# Patient Record
Sex: Female | Born: 1955
Health system: Southern US, Community
[De-identification: ages and names within clinical notes are randomized; demographics above are authoritative.]

## PROBLEM LIST (undated history)

## (undated) DIAGNOSIS — G44229 Chronic tension-type headache, not intractable: Secondary | ICD-10-CM

## (undated) DIAGNOSIS — R002 Palpitations: Secondary | ICD-10-CM

## (undated) DIAGNOSIS — N200 Calculus of kidney: Secondary | ICD-10-CM

## (undated) DIAGNOSIS — I10 Essential (primary) hypertension: Secondary | ICD-10-CM

## (undated) DIAGNOSIS — E079 Disorder of thyroid, unspecified: Secondary | ICD-10-CM

## (undated) DIAGNOSIS — Z8619 Personal history of other infectious and parasitic diseases: Secondary | ICD-10-CM

## (undated) DIAGNOSIS — I8390 Asymptomatic varicose veins of unspecified lower extremity: Secondary | ICD-10-CM

## (undated) DIAGNOSIS — R55 Syncope and collapse: Secondary | ICD-10-CM

## (undated) DIAGNOSIS — T7840XA Allergy, unspecified, initial encounter: Secondary | ICD-10-CM

## (undated) DIAGNOSIS — H269 Unspecified cataract: Secondary | ICD-10-CM

## (undated) HISTORY — DX: Unspecified cataract: H26.9

## (undated) HISTORY — PX: KNEE SURGERY: SHX244

## (undated) HISTORY — PX: ABDOMINAL HYSTERECTOMY: SHX81

## (undated) HISTORY — DX: Asymptomatic varicose veins of unspecified lower extremity: I83.90

## (undated) HISTORY — DX: Palpitations: R00.2

## (undated) HISTORY — DX: Personal history of other infectious and parasitic diseases: Z86.19

## (undated) HISTORY — PX: CATARACT EXTRACTION: SUR2

## (undated) HISTORY — PX: ROTATOR CUFF REPAIR: SHX139

## (undated) HISTORY — PX: TONSILLECTOMY: SUR1361

## (undated) HISTORY — DX: Chronic tension-type headache, not intractable: G44.229

## (undated) HISTORY — DX: Syncope and collapse: R55

## (undated) HISTORY — PX: CHOLECYSTECTOMY: SHX55

## (undated) HISTORY — DX: Allergy, unspecified, initial encounter: T78.40XA

## (undated) HISTORY — DX: Calculus of kidney: N20.0

---

## 1997-02-17 DIAGNOSIS — D126 Benign neoplasm of colon, unspecified: Secondary | ICD-10-CM

## 1997-02-17 HISTORY — DX: Benign neoplasm of colon, unspecified: D12.6

## 1998-05-17 ENCOUNTER — Encounter: Payer: Self-pay | Admitting: Emergency Medicine

## 1998-05-17 ENCOUNTER — Emergency Department (HOSPITAL_COMMUNITY): Admission: EM | Admit: 1998-05-17 | Discharge: 1998-05-17 | Payer: Self-pay | Admitting: Emergency Medicine

## 1998-07-17 ENCOUNTER — Inpatient Hospital Stay (HOSPITAL_COMMUNITY): Admission: RE | Admit: 1998-07-17 | Discharge: 1998-07-19 | Payer: Self-pay | Admitting: Obstetrics and Gynecology

## 1998-07-17 ENCOUNTER — Encounter (INDEPENDENT_AMBULATORY_CARE_PROVIDER_SITE_OTHER): Payer: Self-pay | Admitting: *Deleted

## 1998-07-22 ENCOUNTER — Encounter: Payer: Self-pay | Admitting: Obstetrics and Gynecology

## 1998-07-22 ENCOUNTER — Inpatient Hospital Stay (HOSPITAL_COMMUNITY): Admission: AD | Admit: 1998-07-22 | Discharge: 1998-07-31 | Payer: Self-pay | Admitting: Obstetrics and Gynecology

## 1998-07-24 ENCOUNTER — Encounter: Payer: Self-pay | Admitting: Obstetrics and Gynecology

## 1998-07-25 ENCOUNTER — Encounter: Payer: Self-pay | Admitting: Obstetrics and Gynecology

## 1998-07-27 ENCOUNTER — Encounter: Payer: Self-pay | Admitting: Obstetrics and Gynecology

## 1998-07-29 ENCOUNTER — Encounter: Payer: Self-pay | Admitting: Obstetrics and Gynecology

## 1998-07-31 ENCOUNTER — Inpatient Hospital Stay (HOSPITAL_COMMUNITY): Admission: AD | Admit: 1998-07-31 | Discharge: 1998-08-06 | Payer: Self-pay | Admitting: Obstetrics and Gynecology

## 1998-08-03 ENCOUNTER — Encounter: Payer: Self-pay | Admitting: Obstetrics and Gynecology

## 1998-08-03 ENCOUNTER — Encounter: Payer: Self-pay | Admitting: Emergency Medicine

## 1998-08-04 ENCOUNTER — Encounter: Payer: Self-pay | Admitting: Obstetrics and Gynecology

## 1998-08-04 ENCOUNTER — Encounter: Payer: Self-pay | Admitting: Critical Care Medicine

## 1998-08-05 ENCOUNTER — Encounter: Payer: Self-pay | Admitting: Obstetrics and Gynecology

## 1998-08-06 ENCOUNTER — Encounter: Payer: Self-pay | Admitting: Obstetrics and Gynecology

## 1999-12-19 ENCOUNTER — Encounter: Payer: Self-pay | Admitting: Emergency Medicine

## 1999-12-19 ENCOUNTER — Emergency Department (HOSPITAL_COMMUNITY): Admission: EM | Admit: 1999-12-19 | Discharge: 1999-12-19 | Payer: Self-pay | Admitting: Emergency Medicine

## 2000-03-31 ENCOUNTER — Other Ambulatory Visit: Admission: RE | Admit: 2000-03-31 | Discharge: 2000-03-31 | Payer: Self-pay | Admitting: *Deleted

## 2000-08-23 ENCOUNTER — Encounter: Payer: Self-pay | Admitting: *Deleted

## 2000-08-23 ENCOUNTER — Encounter: Admission: RE | Admit: 2000-08-23 | Discharge: 2000-08-23 | Payer: Self-pay | Admitting: *Deleted

## 2004-02-02 ENCOUNTER — Ambulatory Visit: Payer: Self-pay | Admitting: Internal Medicine

## 2004-03-26 ENCOUNTER — Ambulatory Visit: Payer: Self-pay | Admitting: Internal Medicine

## 2004-07-05 ENCOUNTER — Encounter: Admission: RE | Admit: 2004-07-05 | Discharge: 2004-07-05 | Payer: Self-pay | Admitting: Obstetrics and Gynecology

## 2004-08-10 ENCOUNTER — Ambulatory Visit: Payer: Self-pay | Admitting: Internal Medicine

## 2004-12-13 ENCOUNTER — Ambulatory Visit: Payer: Self-pay | Admitting: Internal Medicine

## 2005-01-12 ENCOUNTER — Ambulatory Visit: Payer: Self-pay | Admitting: Endocrinology

## 2005-01-22 ENCOUNTER — Ambulatory Visit: Payer: Self-pay | Admitting: Family Medicine

## 2005-05-18 ENCOUNTER — Encounter: Payer: Self-pay | Admitting: Obstetrics and Gynecology

## 2005-10-05 ENCOUNTER — Encounter: Admission: RE | Admit: 2005-10-05 | Discharge: 2005-10-05 | Payer: Self-pay | Admitting: Obstetrics and Gynecology

## 2006-03-14 ENCOUNTER — Ambulatory Visit: Payer: Self-pay | Admitting: Internal Medicine

## 2006-03-15 ENCOUNTER — Encounter: Admission: RE | Admit: 2006-03-15 | Discharge: 2006-03-15 | Payer: Self-pay | Admitting: Internal Medicine

## 2006-03-25 ENCOUNTER — Emergency Department (HOSPITAL_COMMUNITY): Admission: EM | Admit: 2006-03-25 | Discharge: 2006-03-26 | Payer: Self-pay | Admitting: Emergency Medicine

## 2006-08-02 ENCOUNTER — Ambulatory Visit: Payer: Self-pay | Admitting: Internal Medicine

## 2006-08-02 LAB — CONVERTED CEMR LAB
ALT: 21 units/L (ref 0–35)
AST: 19 units/L (ref 0–37)
Albumin: 3.5 g/dL (ref 3.5–5.2)
Alkaline Phosphatase: 58 units/L (ref 39–117)
BUN: 10 mg/dL (ref 6–23)
Basophils Absolute: 0.1 10*3/uL (ref 0.0–0.1)
Basophils Relative: 0.9 % (ref 0.0–1.0)
Bilirubin, Direct: 0.1 mg/dL (ref 0.0–0.3)
CO2: 30 meq/L (ref 19–32)
Calcium: 8.7 mg/dL (ref 8.4–10.5)
Chloride: 108 meq/L (ref 96–112)
Creatinine, Ser: 0.7 mg/dL (ref 0.4–1.2)
Eosinophils Absolute: 0.2 10*3/uL (ref 0.0–0.6)
Eosinophils Relative: 3.6 % (ref 0.0–5.0)
GFR calc Af Amer: 114 mL/min
GFR calc non Af Amer: 94 mL/min
Glucose, Bld: 89 mg/dL (ref 70–99)
HCT: 38 % (ref 36.0–46.0)
Hemoglobin: 13 g/dL (ref 12.0–15.0)
Lymphocytes Relative: 20.9 % (ref 12.0–46.0)
MCHC: 34.3 g/dL (ref 30.0–36.0)
MCV: 91.1 fL (ref 78.0–100.0)
Monocytes Absolute: 0.5 10*3/uL (ref 0.2–0.7)
Monocytes Relative: 8.2 % (ref 3.0–11.0)
Neutro Abs: 3.9 10*3/uL (ref 1.4–7.7)
Neutrophils Relative %: 66.4 % (ref 43.0–77.0)
Platelets: 234 10*3/uL (ref 150–400)
Potassium: 4.1 meq/L (ref 3.5–5.1)
RBC: 4.17 M/uL (ref 3.87–5.11)
RDW: 11.7 % (ref 11.5–14.6)
Sodium: 144 meq/L (ref 135–145)
TSH: 2.26 microintl units/mL (ref 0.35–5.50)
Total Bilirubin: 1 mg/dL (ref 0.3–1.2)
Total Protein: 6.5 g/dL (ref 6.0–8.3)
WBC: 5.9 10*3/uL (ref 4.5–10.5)

## 2006-10-31 ENCOUNTER — Encounter: Payer: Self-pay | Admitting: *Deleted

## 2006-10-31 DIAGNOSIS — F341 Dysthymic disorder: Secondary | ICD-10-CM | POA: Insufficient documentation

## 2006-10-31 DIAGNOSIS — Z9089 Acquired absence of other organs: Secondary | ICD-10-CM | POA: Insufficient documentation

## 2006-10-31 DIAGNOSIS — Z9079 Acquired absence of other genital organ(s): Secondary | ICD-10-CM | POA: Insufficient documentation

## 2006-12-17 ENCOUNTER — Emergency Department (HOSPITAL_COMMUNITY): Admission: EM | Admit: 2006-12-17 | Discharge: 2006-12-17 | Payer: Self-pay | Admitting: Emergency Medicine

## 2007-03-17 ENCOUNTER — Encounter: Payer: Self-pay | Admitting: Family Medicine

## 2007-03-17 ENCOUNTER — Ambulatory Visit: Payer: Self-pay | Admitting: Family Medicine

## 2007-03-17 DIAGNOSIS — H1010 Acute atopic conjunctivitis, unspecified eye: Secondary | ICD-10-CM | POA: Insufficient documentation

## 2007-04-19 ENCOUNTER — Ambulatory Visit: Payer: Self-pay | Admitting: Internal Medicine

## 2007-04-19 DIAGNOSIS — R55 Syncope and collapse: Secondary | ICD-10-CM | POA: Insufficient documentation

## 2007-05-01 ENCOUNTER — Encounter (INDEPENDENT_AMBULATORY_CARE_PROVIDER_SITE_OTHER): Payer: Self-pay | Admitting: *Deleted

## 2007-05-02 ENCOUNTER — Telehealth: Payer: Self-pay | Admitting: Internal Medicine

## 2007-05-31 ENCOUNTER — Encounter: Payer: Self-pay | Admitting: Internal Medicine

## 2007-07-30 ENCOUNTER — Encounter: Admission: RE | Admit: 2007-07-30 | Discharge: 2007-07-30 | Payer: Self-pay | Admitting: Obstetrics and Gynecology

## 2007-07-30 ENCOUNTER — Encounter: Payer: Self-pay | Admitting: Internal Medicine

## 2007-08-28 ENCOUNTER — Ambulatory Visit: Payer: Self-pay | Admitting: Internal Medicine

## 2007-08-28 LAB — CONVERTED CEMR LAB
ALT: 33 units/L (ref 0–35)
AST: 28 units/L (ref 0–37)
Albumin: 3.7 g/dL (ref 3.5–5.2)
Alkaline Phosphatase: 62 units/L (ref 39–117)
BUN: 10 mg/dL (ref 6–23)
Basophils Absolute: 0 10*3/uL (ref 0.0–0.1)
Basophils Relative: 0.7 % (ref 0.0–3.0)
Bilirubin, Direct: 0.3 mg/dL (ref 0.0–0.3)
CO2: 27 meq/L (ref 19–32)
Calcium: 9.1 mg/dL (ref 8.4–10.5)
Chloride: 106 meq/L (ref 96–112)
Cholesterol: 178 mg/dL (ref 0–200)
Creatinine, Ser: 0.7 mg/dL (ref 0.4–1.2)
Eosinophils Absolute: 0.2 10*3/uL (ref 0.0–0.7)
Eosinophils Relative: 3.6 % (ref 0.0–5.0)
GFR calc Af Amer: 113 mL/min
GFR calc non Af Amer: 94 mL/min
Glucose, Bld: 102 mg/dL — ABNORMAL HIGH (ref 70–99)
HCT: 38.8 % (ref 36.0–46.0)
HDL: 59.9 mg/dL (ref >39.0)
Hemoglobin, Urine: NEGATIVE
Hemoglobin: 13.4 g/dL (ref 12.0–15.0)
LDL Cholesterol: 106 mg/dL — ABNORMAL HIGH (ref 0–99)
Leukocytes, UA: NEGATIVE
Lymphocytes Relative: 22.3 % (ref 12.0–46.0)
MCHC: 34.5 g/dL (ref 30.0–36.0)
MCV: 90.8 fL (ref 78.0–100.0)
Monocytes Absolute: 0.5 10*3/uL (ref 0.1–1.0)
Monocytes Relative: 9.2 % (ref 3.0–12.0)
Neutro Abs: 3.9 10*3/uL (ref 1.4–7.7)
Neutrophils Relative %: 64.2 % (ref 43.0–77.0)
Nitrite: NEGATIVE
Platelets: 216 10*3/uL (ref 150–400)
Potassium: 4.1 meq/L (ref 3.5–5.1)
RBC: 4.28 M/uL (ref 3.87–5.11)
RDW: 11.9 % (ref 11.5–14.6)
Sodium: 141 meq/L (ref 135–145)
Specific Gravity, Urine: >=1.03 (ref 1.000–1.03)
TSH: 1.78 microintl units/mL (ref 0.35–5.50)
Total Bilirubin: 1.7 mg/dL — ABNORMAL HIGH (ref 0.3–1.2)
Total CHOL/HDL Ratio: 3
Total Protein, Urine: NEGATIVE mg/dL
Total Protein: 6.7 g/dL (ref 6.0–8.3)
Triglycerides: 62 mg/dL (ref 0–149)
Urine Glucose: NEGATIVE mg/dL
Urobilinogen, UA: 1 (ref 0.0–1.0)
VLDL: 12 mg/dL (ref 0–40)
WBC: 5.9 10*3/uL (ref 4.5–10.5)
pH: 5.5 (ref 5.0–8.0)

## 2007-09-04 ENCOUNTER — Ambulatory Visit: Payer: Self-pay | Admitting: Internal Medicine

## 2008-04-28 ENCOUNTER — Encounter: Payer: Self-pay | Admitting: Internal Medicine

## 2008-04-28 DIAGNOSIS — Z87442 Personal history of urinary calculi: Secondary | ICD-10-CM | POA: Insufficient documentation

## 2008-04-29 ENCOUNTER — Telehealth (INDEPENDENT_AMBULATORY_CARE_PROVIDER_SITE_OTHER): Payer: Self-pay | Admitting: *Deleted

## 2008-05-02 ENCOUNTER — Ambulatory Visit: Payer: Self-pay | Admitting: Internal Medicine

## 2008-05-05 ENCOUNTER — Encounter: Payer: Self-pay | Admitting: Internal Medicine

## 2008-05-05 ENCOUNTER — Telehealth (INDEPENDENT_AMBULATORY_CARE_PROVIDER_SITE_OTHER): Payer: Self-pay | Admitting: *Deleted

## 2008-05-05 ENCOUNTER — Telehealth: Payer: Self-pay | Admitting: Internal Medicine

## 2008-05-07 ENCOUNTER — Ambulatory Visit: Payer: Self-pay | Admitting: Internal Medicine

## 2008-05-07 DIAGNOSIS — K648 Other hemorrhoids: Secondary | ICD-10-CM | POA: Insufficient documentation

## 2008-05-11 ENCOUNTER — Telehealth: Payer: Self-pay | Admitting: Internal Medicine

## 2008-07-14 ENCOUNTER — Emergency Department (HOSPITAL_COMMUNITY): Admission: EM | Admit: 2008-07-14 | Discharge: 2008-07-14 | Payer: Self-pay | Admitting: Family Medicine

## 2008-07-15 ENCOUNTER — Telehealth: Payer: Self-pay | Admitting: Internal Medicine

## 2008-07-16 ENCOUNTER — Emergency Department (HOSPITAL_COMMUNITY): Admission: EM | Admit: 2008-07-16 | Discharge: 2008-07-16 | Payer: Self-pay | Admitting: Emergency Medicine

## 2008-08-13 ENCOUNTER — Telehealth: Payer: Self-pay | Admitting: Internal Medicine

## 2008-08-14 ENCOUNTER — Ambulatory Visit: Payer: Self-pay | Admitting: Internal Medicine

## 2008-08-14 DIAGNOSIS — J069 Acute upper respiratory infection, unspecified: Secondary | ICD-10-CM | POA: Insufficient documentation

## 2008-10-22 ENCOUNTER — Ambulatory Visit: Payer: Self-pay | Admitting: Internal Medicine

## 2008-10-22 DIAGNOSIS — R609 Edema, unspecified: Secondary | ICD-10-CM | POA: Insufficient documentation

## 2008-10-22 LAB — CONVERTED CEMR LAB
Rhuematoid fact SerPl-aCnc: 20 intl units/mL (ref 0.0–20.0)
Sed Rate: 22 mm/hr (ref 0–22)

## 2008-10-27 ENCOUNTER — Encounter: Payer: Self-pay | Admitting: Internal Medicine

## 2008-10-27 LAB — CONVERTED CEMR LAB: Protein, Ur: 52 mg/24hr (ref 50–100)

## 2008-10-31 ENCOUNTER — Telehealth: Payer: Self-pay | Admitting: Internal Medicine

## 2008-11-06 ENCOUNTER — Encounter: Payer: Self-pay | Admitting: Internal Medicine

## 2008-11-06 ENCOUNTER — Ambulatory Visit: Payer: Self-pay

## 2008-11-06 ENCOUNTER — Ambulatory Visit: Payer: Self-pay | Admitting: Cardiology

## 2008-11-06 ENCOUNTER — Ambulatory Visit (HOSPITAL_COMMUNITY): Admission: RE | Admit: 2008-11-06 | Discharge: 2008-11-06 | Payer: Self-pay | Admitting: Cardiology

## 2008-11-13 ENCOUNTER — Ambulatory Visit: Payer: Self-pay | Admitting: Internal Medicine

## 2008-11-13 ENCOUNTER — Telehealth: Payer: Self-pay | Admitting: Internal Medicine

## 2009-01-29 ENCOUNTER — Ambulatory Visit: Payer: Self-pay | Admitting: Internal Medicine

## 2009-02-10 ENCOUNTER — Encounter: Admission: RE | Admit: 2009-02-10 | Discharge: 2009-02-10 | Payer: Self-pay | Admitting: Obstetrics and Gynecology

## 2009-03-17 LAB — CONVERTED CEMR LAB: Pap Smear: NORMAL

## 2009-04-03 ENCOUNTER — Ambulatory Visit: Payer: Self-pay | Admitting: Internal Medicine

## 2009-04-03 DIAGNOSIS — D689 Coagulation defect, unspecified: Secondary | ICD-10-CM | POA: Insufficient documentation

## 2009-04-03 LAB — CONVERTED CEMR LAB
Basophils Absolute: 0 10*3/uL (ref 0.0–0.1)
Basophils Relative: 0.8 % (ref 0.0–3.0)
Eosinophils Absolute: 0.1 10*3/uL (ref 0.0–0.7)
Eosinophils Relative: 2.5 % (ref 0.0–5.0)
HCT: 40.5 % (ref 36.0–46.0)
Hemoglobin: 13.5 g/dL (ref 12.0–15.0)
INR: 1 (ref 0.8–1.0)
Lymphocytes Relative: 19.9 % (ref 12.0–46.0)
Lymphs Abs: 1.2 10*3/uL (ref 0.7–4.0)
MCHC: 33.2 g/dL (ref 30.0–36.0)
MCV: 94 fL (ref 78.0–100.0)
Monocytes Absolute: 0.5 10*3/uL (ref 0.1–1.0)
Monocytes Relative: 8.6 % (ref 3.0–12.0)
Neutro Abs: 4 10*3/uL (ref 1.4–7.7)
Neutrophils Relative %: 68.2 % (ref 43.0–77.0)
Platelets: 210 10*3/uL (ref 150.0–400.0)
Prothrombin Time: 10.3 s (ref 9.1–11.7)
RBC: 4.31 M/uL (ref 3.87–5.11)
RDW: 11.9 % (ref 11.5–14.6)
WBC: 5.8 10*3/uL (ref 4.5–10.5)
aPTT: 26.5 s (ref 21.7–28.8)

## 2009-04-06 ENCOUNTER — Telehealth: Payer: Self-pay | Admitting: Internal Medicine

## 2009-04-06 ENCOUNTER — Encounter: Payer: Self-pay | Admitting: Internal Medicine

## 2009-04-16 ENCOUNTER — Ambulatory Visit: Payer: Self-pay | Admitting: Internal Medicine

## 2009-09-29 ENCOUNTER — Ambulatory Visit: Payer: Self-pay | Admitting: Internal Medicine

## 2009-09-29 LAB — CONVERTED CEMR LAB
ALT: 37 units/L — ABNORMAL HIGH (ref 0–35)
AST: 31 units/L (ref 0–37)
Albumin: 3.9 g/dL (ref 3.5–5.2)
Alkaline Phosphatase: 65 units/L (ref 39–117)
BUN: 15 mg/dL (ref 6–23)
Basophils Absolute: 0 10*3/uL (ref 0.0–0.1)
Basophils Relative: 0.6 % (ref 0.0–3.0)
Bilirubin Urine: NEGATIVE
Bilirubin, Direct: 0.2 mg/dL (ref 0.0–0.3)
CO2: 31 meq/L (ref 19–32)
Calcium: 9 mg/dL (ref 8.4–10.5)
Chloride: 108 meq/L (ref 96–112)
Cholesterol: 181 mg/dL (ref 0–200)
Creatinine, Ser: 0.7 mg/dL (ref 0.4–1.2)
Eosinophils Absolute: 0.2 10*3/uL (ref 0.0–0.7)
Eosinophils Relative: 3 % (ref 0.0–5.0)
GFR calc non Af Amer: 99.37 mL/min (ref >60)
Glucose, Bld: 81 mg/dL (ref 70–99)
HCT: 38.6 % (ref 36.0–46.0)
HDL: 56 mg/dL (ref >39.00)
Hemoglobin, Urine: NEGATIVE
Hemoglobin: 13.2 g/dL (ref 12.0–15.0)
Ketones, ur: NEGATIVE mg/dL
LDL Cholesterol: 112 mg/dL — ABNORMAL HIGH (ref 0–99)
Leukocytes, UA: NEGATIVE
Lymphocytes Relative: 27.8 % (ref 12.0–46.0)
Lymphs Abs: 1.7 10*3/uL (ref 0.7–4.0)
MCHC: 34.2 g/dL (ref 30.0–36.0)
MCV: 92.6 fL (ref 78.0–100.0)
Monocytes Absolute: 0.6 10*3/uL (ref 0.1–1.0)
Monocytes Relative: 10 % (ref 3.0–12.0)
Neutro Abs: 3.5 10*3/uL (ref 1.4–7.7)
Neutrophils Relative %: 58.6 % (ref 43.0–77.0)
Nitrite: NEGATIVE
Platelets: 198 10*3/uL (ref 150.0–400.0)
Potassium: 4.3 meq/L (ref 3.5–5.1)
RBC: 4.16 M/uL (ref 3.87–5.11)
RDW: 12.6 % (ref 11.5–14.6)
Sodium: 146 meq/L — ABNORMAL HIGH (ref 135–145)
Specific Gravity, Urine: >=1.03 (ref 1.000–1.030)
TSH: 1.77 microintl units/mL (ref 0.35–5.50)
Total Bilirubin: 1.1 mg/dL (ref 0.3–1.2)
Total CHOL/HDL Ratio: 3
Total Protein, Urine: NEGATIVE mg/dL
Total Protein: 6.8 g/dL (ref 6.0–8.3)
Triglycerides: 66 mg/dL (ref 0.0–149.0)
Urine Glucose: NEGATIVE mg/dL
Urobilinogen, UA: 1 (ref 0.0–1.0)
VLDL: 13.2 mg/dL (ref 0.0–40.0)
WBC: 5.9 10*3/uL (ref 4.5–10.5)
pH: 5.5 (ref 5.0–8.0)

## 2009-10-02 ENCOUNTER — Ambulatory Visit: Payer: Self-pay | Admitting: Internal Medicine

## 2010-02-18 NOTE — Consult Note (Signed)
Summary: Consultation Report/[facility]  Consultation Report/[facility]   Imported By: Lester Neodesha 06/12/2007 11:13:11  _____________________________________________________________________  External Attachment:    Type:   Image     Comment:   External Document

## 2010-02-18 NOTE — Assessment & Plan Note (Signed)
Summary: swollen ankles,feet/cd   Vital Signs:  Patient profile:   55 year old female Height:      65 inches Weight:      263 pounds BMI:     43.92 O2 Sat:      97 % on Room air Temp:     97.2 degrees F oral Pulse rate:   61 / minute BP sitting:   122 / 82  (left arm) Cuff size:   large  Vitals Entered By: Bill Salinas CMA (October 22, 2008 4:22 PM)  O2 Flow:  Room air CC: PT here with complaint of feet swelling x couple of months. She states here lately  her feet are staying swollen all day/ ab   Primary Care Provider:  Jacques Navy MD  CC:  PT here with complaint of feet swelling x couple of months. She states here lately  her feet are staying swollen all day/ ab.  History of Present Illness: Patient reports a several week h/o of pedal edema that is progressively getting worse. There is no resolution of swelling overnight with elevation. She has had a bout of pneumonia earlier in the summer but has made a good recovery. she has had no other intercurrent illness. She reports that the swelln can be painful - a burning type discomfort. No SOB, caridac symptoms of any kind.  Patient was treated at [facility] June 28th and 30th: labs reviewed: C-xray x 2 with RLL infiltrate; WBC normal, chemistry panel normal with Cr 0.8. She was treated with antibiotics x 10 days. she did get better.   Current Medications (verified): 1)  None  Allergies (verified): 1)  ! Penicillin 2)  ! Sulfa 3)  ! Morphine 4)  ! Septra 5)  ! * Contrast Dye  Past History:  Past Medical History: Last updated: 05/02/2008 UCD PALPITATIONS, HX OF (ICD-V12.50) Varicose veins near syncope tension headaches Nephrolithiasis, hx of (04/28/2008)    Physician roster:               Gyn - Dr. 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    Physical Exam  General:  heavyset white female in no distress Head:  normocephalic, atraumatic, and no abnormalities observed.   Eyes:  vision grossly intact, pupils equal, pupils round, corneas and lenses clear, and no injection.   Ears:  R ear normal and L ear normal.   Neck:  supple and full ROM.   Chest Wall:  no tenderness.   Lungs:  normal respiratory effort, no intercostal retractions, no accessory muscle use, normal breath sounds, no crackles, and no wheezes.   Heart:  normal rate, regular rhythm, no JVD, and no HJR.   Abdomen:  Bowel sounds  positive,abdomen soft and non-tender without masses, organomegaly or hernias noted. Msk:  ankles with normal range of motion with minimal tenderness. No inflammation over the joint lines. Pulses:  2+ radial Neurologic:  alert & oriented X3, cranial nerves II-XII intact, and gait normal.   Skin:  non-pitting edema of the feet and ankles. No ulceratoin or skin breakdown, no erythema Psych:  Oriented X3 and memory intact for recent and remote.     Impression & Recommendations:  Problem # 1:  PERIPHERAL EDEMA (ICD-782.3)  Pedal edema bilateral without pitting. Will need to r/o cardiac or renal, r/o rheumatologic cause.  Plan - 2D echo           24 Hr urine  collection for total protein and creatnine clearance           Lab- ESR, RF  Orders: T-Urine 24 Hr. Protein [phone]) TLB-Sedimentation Rate (ESR) (85652-ESR) TLB-Rheumatoid Factor (RA) (64403-KV) Cardiology Referral (Cardiology)

## 2010-02-18 NOTE — Assessment & Plan Note (Signed)
Summary: ACUTE/KDC      Current Allergies: ! PENICILLIN ! SULFA ! MORPHINE ! SEPTRA        Complete Medication List: 1)  Aspirin 81 Mg Tabs (Aspirin) .... Take one tablet once daily 2)  Toprol Xl 25 Mg Tb24 (Metoprolol succinate) .... Take one tablet once daily 3)  Lexapro 10 Mg Tabs (Escitalopram oxalate) .... Take one tablet once daily 4)  Ambien 10 Mg Tabs (Zolpidem tartrate) .... Take one tablet as needed at bedtime     ]

## 2010-02-18 NOTE — Assessment & Plan Note (Signed)
Summary: EYE LID IS SWOLLEN/NWS   Vital Signs:  Patient profile:   55 year old female Height:      65 inches Weight:      260 pounds BMI:     43.42 O2 Sat:      97 % on Room air Temp:     97.5 degrees F oral Pulse rate:   52 / minute BP sitting:   142 / 100  (left arm) Cuff size:   large  Vitals Entered By: Ami Bullins CMA (November 13, 2008 2:00 PM)  O2 Flow:  Room air CC: pt here with complaint of right eye being swollen x 2 days/ ab   Primary Care Ondria Oswald:  Jacques Navy MD  CC:  pt here with complaint of right eye being swollen x 2 days/ ab.  History of Present Illness: Alicia [name] is a 55 year old well appearing female who presents today with a 2 day history of a swollen, erythematous, and sore right eyelid.   She denies any itching, purulent discharge, or overflow of tears.   The eyelid was less erythematous this morning and the swelling has come down significantly today.    She said that a warm washcloth helps both the swelling and pain.          Mrs. Magar was hypertensive today with a blood pressure of 142/100.   She recently had a 2D echocardiogram on 11/06/08 which revealed only mild LVH.   All of her valves were normal.  She notices palpitations in her chest occasionally that resolve on their own.  She denies any chest pain, shortness of breath or PND.   The swelling in her ankles has not resolved but is not painful.     Current Medications (verified): 1)  None  Allergies (verified): 1)  ! Penicillin 2)  ! Sulfa 3)  ! Morphine 4)  ! Septra 5)  ! * Contrast Dye PMH-FH-SH reviewed-no changes except otherwise noted  Review of Systems       The patient complains of peripheral edema.  The patient denies anorexia, fever, weight loss, weight gain, vision loss, decreased hearing, chest pain, syncope, dyspnea on exertion, prolonged cough, abdominal pain, difficulty walking, depression, abnormal bleeding, enlarged lymph nodes, and angioedema.         She has a  swollen and sore right eyelid  Physical Exam  General:  Well appearing female in no apparent distress Eyes:  Right eyelid was swollen and sore.  There was no discharge or overflow.    Meibomian cyst was identified under right upper eyelid.  PEERL. Corneas clear. Lungs:  Lungs clear to ausculation bilaterally in all lung fields Heart:  RRR no rubs murmurs or gallops. No S3 or S4.   Good pedal pulses.   Mild lower extremity edema in both legs.      Impression & Recommendations:  Problem # 1:  Chalazion The erythematous, swollen and sore right eyelid with a small meibomian cyst is consistent with a chalazion.  Patient was encouraged that this would eventually resolve over the course of a month and to use a warm washcloth to improve her symptoms. No antibiotic drops indicated.  Problem # 2:  PALPITATIONS, HX OF (ICD-V12.50) 2D echo was normal. Symptoms have abated. No LV dysfunction as a cause of peripheral edema.   Problem # 3:  PERIPHERAL EDEMA (ICD-782.3) Normla renal function by history. 2D echo normal. Suspect etiology is venous insufficiency. Full explanation provided to patient.  Plan  elevation of legs         otc support hosiery  Problem # 4:  ELEVATED BLOOD PRESSURE WITHOUT DIAGNOSIS OF HYPERTENSION (ICD-796.2) Patient is noted to have elevated BP today. She has not history of diagnosis of HTN  Plan - home monitoring over the next two weeks with report back of her readings. Recommendations to follow.   Preventive Care Screening  Last Tetanus Booster:    Date:  02/18/2003    Results:  Historical

## 2010-02-18 NOTE — Progress Notes (Signed)
  Phone Note Outgoing Call   Summary of Call: pt left specimen of kidney stone, per Dr. Debby Bud to go to lab for analysis. Initial call taken by: Zackery Barefoot CMA,  May 05, 2008 11:34 AM

## 2010-02-18 NOTE — Progress Notes (Signed)
  Phone Note Outgoing Call   Reason for Call: Discuss lab or test results Summary of Call: Please call patient: Stone analysis - calcium oxalate stone. Continue to acidify urine with lemon juice daily.  thanks Initial call taken by: Jacques Navy MD,  May 11, 2008 4:49 PM  Follow-up for Phone Call        Pt informed. Follow-up by: Zackery Barefoot CMA,  May 12, 2008 6:59 PM

## 2010-02-18 NOTE — Assessment & Plan Note (Signed)
Summary: SORE THROAT/NWS   Vital Signs:  Patient profile:   56 year old female Height:      65 inches Weight:      255.50 pounds BMI:     42.67 O2 Sat:      98 % on Room air Temp:     97.9 degrees F oral Pulse rate:   60 / minute Pulse rhythm:   regular BP sitting:   110 / 76  (left arm) Cuff size:   large  Vitals Entered By: Bill Salinas CMA (April 16, 2009 2:09 PM)  O2 Flow:  Room air CC: pt states she has sore throat and no voice x1 week. pain in throat is constant/took ibuprofen with little relief/aj   Primary Care Provider:  Jacques Navy MD  CC:  pt states she has sore throat and no voice x1 week. pain in throat is constant/took ibuprofen with little relief/aj.  History of Present Illness: Patients presents acutely for sore throat with layrngitis which started Saturday. Painful to swallow, no swollen glands, no fever. She has lots of post-nasal, She is coughing, worse at night, productive of thick yellow mucus. No DOE.  Current Medications (verified): 1)  Hydrochlorothiazide 25 Mg Tabs (Hydrochlorothiazide) .... 1/2 Tablet Once Daily For Swelling of Eyelids.  Allergies (verified): 1)  ! Penicillin 2)  ! Sulfa 3)  ! Morphine 4)  ! Septra 5)  ! * Contrast Dye  Past History:  Past Medical History: Last updated: 05/02/2008 UCD PALPITATIONS, HX OF (ICD-V12.50) Varicose veins near syncope tension headaches Nephrolithiasis, hx of (04/28/2008)    Physician roster:               Gyn - Dr. 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    Physical Exam  General:  Overweight white female in no acute distress Head:  no sinus tenderness to percussion Eyes:  pupils equal, pupils round, corneas and lenses clear, and no injection.   Mouth:  posterior pharynx without erythema, without exudate. Neck:  supple.   Lungs:  normal respiratory effort and normal breath sounds.   Heart:  normal rate and no murmur.   Pulses:  2+ radial Skin:  turgor normal and color normal.   Cervical Nodes:  no anterior cervical adenopathy and no posterior cervical adenopathy.  No submandibular nodes. Psych:  Oriented X3, memory intact for recent and remote, and normally interactive.     Impression & Recommendations:  Problem # 1:  LARYNGITIS-ACUTE (ICD-464.00) No evidencde of  bactetrial infection   Plan - supportive care - see pt instr.  Complete Medication List: 1)  Hydrochlorothiazide 25 Mg Tabs (Hydrochlorothiazide) .... 1/2 tablet once daily for swelling of eyelids. 2)  Promethazine-codeine 6.25-10 Mg/79ml Syrp (Promethazine-codeine) .Marland Kitchen.. 1 or 2 tsp at bedtime for cough  Patient Instructions: 1)  Laryngitis - no evidence of a bacterial infection, e.g. strep. Most likely a viral infection vs allergy. Plan - vitamin C 1500mg  daily, hydrate, tylenol for pain or fever - 500-1000mg  three times a day, ecchinacea can be very helpful, otc loratadine 10mg  once a day for post-nasal drip, promethazine with  codeine cough syrup 1-2 tsp at bedtime. May use the syrup during the day but may cause drowsiness. It is fine to substitute robitussin DM 1 tsp every 6 hours during the day. Watch for fever, chills, purulent dark colored mucus.  Prescriptions: PROMETHAZINE-CODEINE 6.25-10 MG/5ML SYRP (PROMETHAZINE-CODEINE) 1 or 2 tsp at bedtime for cough  #8 oz x 0   Entered and Authorized by:   Jacques Navy MD   Signed by:   Jacques Navy MD on 04/16/2009   Method used:   Print then Give to Patient   RxID:   [id number]

## 2010-02-18 NOTE — Progress Notes (Signed)
Summary: appt  Phone Note Call from Patient Call back at Home Phone [phone] Call back at or [phone] ask for [employer] class   Caller: Patient Call For: Jacques Navy MD Summary of Call: Pt went to ER in [place] and passed a kidney stone. They wanted her to f/u with dr Debby Bud this week. Please advise when she can be worked in. Pt requests late afternoon appt thur or fri. Initial call taken by: Verdell Face,  April 29, 2008 4:31 PM  Follow-up for Phone Call        Friday Follow-up by: Jacques Navy MD,  April 30, 2008 8:04 AM  Additional Follow-up for Phone Call Additional follow up Details #1::        Sent to scheduling to set up Additional Follow-up by: Rock Nephew CMA,  April 30, 2008 8:11 AM    Additional Follow-up for Phone Call Additional follow up Details #2::    4/16 appt at 4:35 pm. Appt sched w/husband. He will make pt aware she is being worked in to a full schedule and advised to bring a book. Follow-up by: Verdell Face,  April 30, 2008 8:32 AM

## 2010-02-18 NOTE — Progress Notes (Signed)
Summary: rectal bleed  Phone Note Call from Patient   Summary of Call: Pt c/o rectal bleeding after bm. This happened once before but this time was much more blood. Patient is requesting a call, she wants to know if she should see pcp or be referred to GI. Initial call taken by: Lamar Sprinkles,  May 05, 2008 1:47 PM  Follow-up for Phone Call        Blood on the toilet tissue. No pain. No intercurrent bleeding.  Plan: will work her in Wednesday, april 21 for anoscopy. Follow-up by: Jacques Navy MD,  May 06, 2008 6:37 PM  Additional Follow-up for Phone Call Additional follow up Details #1::        Please have pt to come to office for an appointment. Please call pt and double book the 3:30pm. Thanks!! Additional Follow-up by: Zackery Barefoot CMA,  May 07, 2008 8:19 AM    Additional Follow-up for Phone Call Additional follow up Details #2::    appt today @3 :30pm. Follow-up by: Verdell Face,  May 07, 2008 9:07 AM

## 2010-02-18 NOTE — Letter (Signed)
Summary: [facility] Consult Scheduled Letter  [facility]  [address] [place], [place] [address]   Phone: [phone]  Fax: [phone]      05/01/2007 MRN: [id number]  [facility] [address] [place], [place]  [address]    Dear Ms. Hickey,      We have scheduled an appointment for you.  At the recommendation of Dr. Debby Bud, we have scheduled you a consult with Dr. Thad Ranger on May 31, 2007 at 8:30am.  Their phone number is [phone].  If this appointment day and time is not convenient for you, please feel free to call the office of the doctor you are being referred to at the number listed above and reschedule the appointment.  Dr. [facility]  [address]  [address] [place], [place] [address]  *Please arrive 30 minutes prior to your appointment*  Thank you,  Patient Care Coordinator [facility]

## 2010-02-18 NOTE — Assessment & Plan Note (Signed)
Summary: EAR PAIN,BLEEDING EARLIER THIS AM/MEN/CD   Vital Signs:  Patient profile:   55 year old female Height:      65 inches Weight:      258.50 pounds BMI:     43.17 O2 Sat:      95 % on Room air Temp:     98.4 degrees F oral Pulse rate:   62 / minute Pulse rhythm:   regular Resp:     16 per minute BP sitting:   138 / 86  (left arm) Cuff size:   large  Vitals Entered By: Rock Nephew CMA (April 03, 2009 3:39 PM)  Nutrition Counseling: Patient's BMI is greater than 25 and therefore counseled on weight management options.  O2 Flow:  Room air CC: Right inner ear bleeding x this am w/ R watery eye and bleeding thru mouth Is Patient Diabetic? No Pain Assessment Patient in pain? no        Primary Care Provider:  Jacques Navy MD  CC:  Right inner ear bleeding x this am w/ R watery eye and bleeding thru mouth.  History of Present Illness: New to me she complains that she has noticed a small amount of blood from her right ear and nose intermittently for several days. She takes "a lot" of aspirin.  Preventive Screening-Counseling & Management  Alcohol-Tobacco     Alcohol drinks/day: 0     Smoking Status: never  Hep-HIV-STD-Contraception     Hepatitis Risk: no risk noted     HIV Risk: no risk noted     STD Risk: no risk noted      Sexual History:  currently monogamous.        Drug Use:  never.        Blood Transfusions:  no.    Medications Prior to Update: 1)  Hydrochlorothiazide 25 Mg Tabs (Hydrochlorothiazide) .... 1/2 Tablet Once Daily For Swelling of Eyelids.  Current Medications (verified): 1)  Hydrochlorothiazide 25 Mg Tabs (Hydrochlorothiazide) .... 1/2 Tablet Once Daily For Swelling of Eyelids.  Allergies (verified): 1)  ! Penicillin 2)  ! Sulfa 3)  ! Morphine 4)  ! Septra 5)  ! * Contrast Dye  Past History:  Past Medical History: Reviewed history from 05/02/2008 and no changes required. UCD PALPITATIONS, HX OF (ICD-V12.50) Varicose  veins near syncope tension headaches Nephrolithiasis, hx of (04/28/2008)    Physician roster:               Gyn - Dr. Omelia Blackwater-   Past Surgical History: Reviewed history from 09/04/2007 and no changes required. HYSTERECTOMY, VAGINAL, HX OF (ICD-V45.77) '99 CHOLECYSTECTOMY, HX OF (ICD-V45.79) '87 TONSILLECTOMY, HX OF (ICD-V45.79)-remote  G3P3    Family History: Reviewed history from 09/04/2007 and no changes required. mother - deceased @ 81: DM, hepatitis related cirrhosis from transfusion father - '27: AVR-porcine, endocarditis PAunt- colon cancer Neg- breast cancer Brother - NIDDM  Social History: Reviewed history from 09/04/2007 and no changes required. 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   Heme:  Complains of bleeding; denies abnormal bruising, enlarge lymph nodes, fevers, pallor, and skin discoloration.  Physical Exam  General:  Well-developed,well-nourished,in no acute distress; alert,appropriate and cooperative throughout examination Head:  Normocephalic and atraumatic without obvious abnormalities. No apparent alopecia or balding. Eyes:  No corneal or conjunctival inflammation noted. EOMI. Perrla. Funduscopic exam benign, without hemorrhages, exudates or papilledema. Vision grossly normal. Ears:  External ear exam shows no significant lesions or deformities.  Otoscopic examination reveals clear canals, tympanic membranes are intact bilaterally without  bulging, retraction, inflammation or discharge. Hearing is grossly normal bilaterally. Nose:  External nasal examination shows no deformity or inflammation. Nasal mucosa are pink and moist without lesions or exudates. Mouth:  Oral mucosa and oropharynx without lesions or exudates.  Teeth in good repair. Neck:  No deformities, masses, or tenderness noted. Lungs:  normal respiratory effort, no intercostal retractions, no accessory muscle use, normal breath sounds, no dullness, no fremitus, no crackles, and no wheezes.   Heart:  Normal rate and regular rhythm. S1 and S2 normal without gallop, murmur, click, rub or other extra sounds. Abdomen:  Bowel sounds positive,abdomen soft and non-tender without masses, organomegaly or hernias noted. Msk:  No deformity or scoliosis noted of thoracic or lumbar spine.   Pulses:  R and L carotid,radial,femoral,dorsalis pedis and posterior tibial pulses are full and equal bilaterally Extremities:  No clubbing, cyanosis, edema, or deformity noted with normal full range of motion of all joints.   Skin:  Intact without suspicious lesions or rashes Cervical Nodes:  no anterior cervical adenopathy and no posterior cervical adenopathy.   Axillary Nodes:  no R axillary adenopathy and no L axillary adenopathy.   Inguinal Nodes:  no R inguinal adenopathy and no L inguinal adenopathy.   Psych:  Cognition and judgment appear intact. Alert and cooperative with normal attention span and concentration. No apparent delusions, illusions, hallucinations   Impression & Recommendations:  Problem # 1:  OTHER AND UNSPECIFIED COAGULATION DEFECTS (ICD-286.9) Assessment New stop all aspirin and nsaid products Orders: Venipuncture (29528) TLB-CBC Platelet - w/Differential (85025-CBCD) TLB-PT (Protime) (85610-PTP) TLB-PTT (85730-PTTL)  Complete Medication List: 1)  Hydrochlorothiazide 25 Mg Tabs (Hydrochlorothiazide) .... 1/2 tablet once daily for swelling of eyelids.  Patient  Instructions: 1)  Please schedule a follow-up appointment in 2 weeks.

## 2010-02-18 NOTE — Assessment & Plan Note (Signed)
Summary: CPX/ RS'D FROM BUMP LIST FOR 8-6/ $50 /NWS   Vital Signs:  Patient Profile:   55 Years Old Female Height:     65 inches Weight:      257 pounds BMI:     42.92 Temp:     98.8 degrees F oral Pulse rate:   60 / minute BP sitting:   142 / 88  (left arm) Cuff size:   large  Vitals Entered By: Zackery Barefoot CMA (September 04, 2007 11:12 AM)                 PCP:  Jacques Navy MD  Chief Complaint:  CPX.  History of Present Illness: Presents for routine evaluation. She is concerned about diabetes, brother recently diagnosed and it runs in the family.  Doing well. Has some pain and stiffness in the fingers. She has seen neurologist for HA and he thought it might tension HAs.  She will be scheduling with her Gyn for routine well-woman exam.    Updated Prior Medication List: No Medications Current Allergies: ! PENICILLIN ! SULFA ! MORPHINE ! SEPTRA  Past Medical History:    Reviewed history from 04/19/2007 and no changes required:       UCD       PALPITATIONS, HX OF (ICD-V12.50)       Varicose veins       near syncope       tension headaches                                   Physician roster:                     Gyn - Dr. Omelia Blackwater-   Past Surgical History:    Reviewed history from 10/31/2006 and no changes required:       HYSTERECTOMY, VAGINAL, HX OF (ICD-V45.77) '99       CHOLECYSTECTOMY, HX OF (ICD-V45.79) '87       TONSILLECTOMY, HX OF (ICD-V45.79)-remote              G3P3           Family History:    Reviewed history from 04/19/2007 and no changes required:       mother - deceased @ 42: DM, hepatitis related cirrhosis from transfusion       father - '27: AVR-porcine, endocarditis       PAunt- colon cancer       Neg- breast cancer       Brother - NIDDM  Social History:    Reviewed history from 04/19/2007 and no changes required:       Marcelino Scot -BA,       married '79       2 daughters - '82, '93; 1 son -  '87       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       SO - good health   Risk Factors:  Caffeine use:  2 drinks per day  Mammogram History:     Date of Last Mammogram:  07/18/2007    Results:  Done   Colonoscopy History:     Date of Last Colonoscopy:  03/31/2006    Results:  Done    Review of Systems  The patient denies anorexia, fever, weight  loss, weight gain, vision loss, decreased hearing, hoarseness, chest pain, syncope, dyspnea on exertion, peripheral edema, prolonged cough, headaches, hemoptysis, abdominal pain, melena, hematochezia, severe indigestion/heartburn, hematuria, incontinence, genital sores, muscle weakness, suspicious skin lesions, transient blindness, difficulty walking, depression, unusual weight change, abnormal bleeding, enlarged lymph nodes, angioedema, and breast masses.     Physical Exam  General:     alert, well-developed, well-nourished, well-hydrated, and normal appearance.   Head:     normocephalic, atraumatic, and no abnormalities observed.   Eyes:     pupils equal, pupils round, pupils react to accomodation, corneas and lenses clear, and no injection.   Ears:     R ear normal and L ear normal.   Mouth:     good dentition, no gingival abnormalities, no dental plaque, and pharynx pink and moist.   Neck:     full ROM and no thyromegaly.   Chest Wall:     No deformities, masses, or tenderness noted. Breasts:     deferred Lungs:     normal respiratory effort, no accessory muscle use, no dullness, and no wheezes.   Heart:     normal rate, regular rhythm, and no JVD.   Abdomen:     soft, non-tender, normal bowel sounds, no guarding, and no hepatomegaly.   Rectal:     deferred Genitalia:     deferred Msk:     normal ROM, no joint tenderness, no joint swelling, no joint warmth, no joint deformities, and no joint instability.   Pulses:     R radial normal and R femoral normal.   Neurologic:     alert & oriented X3, cranial nerves II-XII intact, strength  normal in all extremities, gait normal, and DTRs symmetrical and normal.   Skin:     turgor normal, color normal, no suspicious lesions, and no ulcerations.   Cervical Nodes:     No lymphadenopathy noted Psych:     Oriented X3, memory intact for recent and remote, normally interactive, and good eye contact.      Impression & Recommendations:  Problem # 1:  Preventive Health Care (ICD-V70.0) Up to date with gyn - patient to schedule an appointment for '09 exam. Has had mammogram. colorectal cancer screening in '09 and normal.  discussed healthy life-style to avoid diabetes and to manage BP: low sugar, low carbs, PORTION SIZE control, regular exercise - 30 min 3 times a week as a minimum. Target weight of 190 lbs; goal is loosing 1 lb/month  In summary- a healthy woman who will be working with life-style management for improved health.    ]  Preventive Care Screening  Mammogram:    Date:  07/18/2007    Results:  Done   Colonoscopy:    Date:  03/31/2006    Results:  Done

## 2010-02-18 NOTE — Assessment & Plan Note (Signed)
Summary: CYST ON EYE LID/NWS  #   Vital Signs:  Patient profile:   55 year old female Height:      65 inches Weight:      257 pounds BMI:     42.92 O2 Sat:      98 % on Room air Temp:     97.0 degrees F oral Pulse rate:   63 / minute BP sitting:   142 / 86  (left arm) Cuff size:   large  Vitals Entered By: Bill Salinas CMA (January 29, 2009 4:49 PM)  O2 Flow:  Room air CC: pt here with ongoing complaint of swelling and irratation in her right eye/ ab   Primary Care Provider:  Jacques Navy MD  CC:  pt here with ongoing complaint of swelling and irratation in her right eye/ ab.  History of Present Illness: Patient was seen recently for a swollen and red right upper eyelid diagnosed with a chalazion. She did well with warm compresses. She now returns with recurrent swelling of the eyelid and some discharge. She has had no change in vision.  Current Medications (verified): 1)  None  Allergies (verified): 1)  ! Penicillin 2)  ! Sulfa 3)  ! Morphine 4)  ! Septra 5)  ! * Contrast Dye PMH-FH-SH reviewed-no changes except otherwise noted  Review of Systems  The patient denies anorexia, vision loss, dyspnea on exertion, and headaches.    Physical Exam  General:  overweight white female in no disress Head:  Normocephalic and atraumatic without obvious abnormalities. No apparent alopecia or balding. Eyes:  right eyelid appears normal with no visible cyst. C&S clear, tear duct appears normal, no visible exudate. Lungs:  normal respiratory effort.   Heart:  normal rate and regular rhythm.     Impression & Recommendations:  Problem # 1:  EDEMA OF EYELID (ICD-374.82) c/o swollen eyelid but on exam the lid appears normal. No evidence of infection.  Plan - patient advised to see her opthalmologist.  Complete Medication List: 1)  Hydrochlorothiazide 25 Mg Tabs (Hydrochlorothiazide) .... 1/2 tablet once daily for swelling of eyelids. Prescriptions: HYDROCHLOROTHIAZIDE 25  MG TABS (HYDROCHLOROTHIAZIDE) 1/2 tablet once daily for swelling of eyelids.  #15 x 3   Entered and Authorized by:   Jacques Navy MD   Signed by:   Jacques Navy MD on 01/31/2009   Method used:   Electronically to        [facility] # [phone]* (retail)       [address]       [place], [place]  [address]       Ph: [phone]       Fax: [phone]   RxID:   [id number]    Not Administered:    Influenza Vaccine not given due to: declined

## 2010-02-18 NOTE — Assessment & Plan Note (Signed)
Summary: FELT LIKE SHE WOULD PASS OUT/YESTERDAY-PULSE RATE TODAY WAS 1...   Vital Signs:  Patient Profile:   55 Years Old Female Height:     65 inches Weight:      255 pounds Temp:     98.4 degrees F oral Pulse rate:   60 / minute BP sitting:   138 / 90  (left arm) Cuff size:   large  Vitals Entered By: Zackery Barefoot CMA (April 19, 2007 5:31 PM)                 PCP:  Jacques Navy MD  Chief Complaint:  Dizzy x 1 day.  History of Present Illness: SWhile driving yesterday had an episode of near syncope. She felt light-headed, had visual change but no loss of vision, no facial or body paresthesia, no nausea. Had full motor strength and coordination. Was able to pull over to the roadside. Had normal day, no new foods, no fever, no dysuria. Duration-5 minutes, felt weak and HA for several hours.  She had a hematoma in the right eye several months ago-saw opthal, no further work-up.    Updated Prior Medication List: No Medications Current Allergies: ! PENICILLIN ! SULFA ! MORPHINE ! SEPTRA  Past Medical History:    Reviewed history from 10/31/2006 and no changes required:       PALPITATIONS, HX OF (ICD-V12.50)       Varicose veins         Past Surgical History:    Reviewed history from 10/31/2006 and no changes required:       HYSTERECTOMY, VAGINAL, HX OF (ICD-V45.77)       CHOLECYSTECTOMY, HX OF (ICD-V45.79)       TONSILLECTOMY, HX OF (ICD-V45.79)           Family History:    mother - deceased @ 30: DM, hepatitis related cirrhosis from transfusion    father - '27: AVR-porcine, endocarditis    PAunt- colon cancer    Neg- breast cancer  Social History:    Marcelino Scot -BA,    married '79    2 daughters - '82, '32; 1 son - '87    work: [employer]   Risk Factors:  Alcohol use:  no Exercise:  no Seatbelt use:  100 %   Review of Systems       The patient complains of vision loss and syncope.  The patient denies anorexia, fever, weight loss, weight  gain, decreased hearing, hoarseness, chest pain, dyspnea on exhertion, peripheral edema, prolonged cough, hemoptysis, abdominal pain, melena, hematochezia, severe indigestion/heartburn, hematuria, incontinence, genital sores, muscle weakness, suspicious skin lesions, transient blindness, difficulty walking, depression, unusual weight change, abnormal bleeding, enlarged lymph nodes, angioedema, and breast masses.     Physical Exam  General:     alert, well-developed, well-nourished, well-hydrated, and overweight-appearing.   Head:     normocephalic and atraumatic.   Eyes:     pupils equal, pupils round, pupils reactive to light, corneas and lenses clear, and no injection.   Heart:     normal rate, regular rhythm, no murmur, no gallop, and no JVD.   Msk:     No deformity or scoliosis noted of thoracic or lumbar spine.   Neurologic:     alert & oriented X3, cranial nerves II-XII intact, strength normal in all extremities, gait normal, DTRs symmetrical and normal, finger-to-nose normal, and Romberg negative.  No dysdiadochokinesia.  Normal rapid alternating finger movement. No tremor, no cogwheeling. Psych:  Oriented X3, memory intact for recent and remote, normally interactive, and good eye contact.      Impression & Recommendations:  Problem # 1:  SYNCOPE (ICD-780.2) Patient with an episode of near-syncope. By history no seizure like activity, no cardiac symptoms and no focal deficits or symptoms to suggest CNS origin. Examination was normal. Suspect vaso-vagal episode with no sequelae. No evidence to suggest benefit of neuro-imaging.This was presented to the patient who remains disturbed by this event.  Plan: neurology referral for further evaluation. Orders: Neurology Referral (Neuro)      ]

## 2010-02-18 NOTE — Assessment & Plan Note (Signed)
Summary: per phone note work in/cd   Vital Signs:  Patient profile:   55 year old female Height:      65 inches Weight:      254 pounds O2 Sat:      96 % Temp:     97.8 degrees F oral Pulse rate:   58 / minute BP sitting:   128 / 70  (left arm) Cuff size:   large  Vitals Entered By: Zackery Barefoot CMA (May 07, 2008 4:06 PM)  Primary Care Provider:  Jacques Navy MD   History of Present Illness: Patient with two episodes of rectal bleeding with BM. (see phone note). She does have a h/o hemorrhoids with pregnancy. No abdominal pain, rectal pain, intercurrent bleeding. Had a colonoscopy within the last several years.   Current Medications (verified): 1)  None  Allergies (verified): 1)  ! Penicillin 2)  ! Sulfa 3)  ! Morphine 4)  ! Septra 5)  ! * Contrast Dye PMH-FH-SH reviewed for relevance  Review of Systems       The patient complains of hematochezia.  The patient denies anorexia, weight loss, weight gain, chest pain, dyspnea on exertion, abdominal pain, severe indigestion/heartburn, and hematuria.    Physical Exam  General:  Well-developed,well-nourished,in no acute distress; alert,appropriate and cooperative throughout examination Rectal:  extrnal skin tags, NST. Anoscopy performed: stool/mucus heme negative above the anoscope. Small ring of hemorrhoids at 3-4 cm, raw hemorrhoid at the 0500 position at 1 cm.   Impression & Recommendations:  Problem # 1:  HEMORRHOIDS, INTERNAL, WITH BLEEDING (ICD-455.2)  hematochezia secondary to internal hemorrhoid(s). No evidence of bleeding from more proximal source.  Plan: anusol HC 2.5% supp two times a day x 3-4 days.  Orders: Anoscopy ([address])

## 2010-02-18 NOTE — Progress Notes (Signed)
Summary: RESULTS  Phone Note Call from Patient Call back at Home Phone [phone] Call back at Work Phone [phone] Call back at wk # till 4   Summary of Call: Patient is requesting results of echo. Initial call taken by: Lamar Sprinkles, CMA,  November 13, 2008 8:32 AM  Follow-up for Phone Call        seen toiday Follow-up by: Jacques Navy MD,  November 13, 2008 6:01 PM

## 2010-02-18 NOTE — Assessment & Plan Note (Signed)
Summary: CPX/BCBS/#/CD   Vital Signs:  Patient profile:   55 year old female Height:      65 inches Weight:      262 pounds BMI:     43.76 O2 Sat:      97 % on Room air Temp:     97.3 degrees F oral Pulse rate:   66 / minute BP sitting:   138 / 88  (left arm) Cuff size:   large  Vitals Entered By: Bill Salinas CMA (October 02, 2009 2:37 PM)  O2 Flow:  Room air CC: cpx/ ab Comments Pt on no meds/ ab   Primary Care Provider:  Jacques Navy MD  CC:  cpx/ ab.  History of Present Illness: Presents for routine medical follow-up.  she reports that she will have leg pain at night, worse on days when she has done serious walking. Adivsed to hydrate, stretch and drink tonic water.   Current with Gyn - last exam in March - normal PAP and breast exam. She is current with colonoscopy. She is current with Mammorgram - normal in march 2011.  Due for an eye exam.   Current Medications (verified): 1)  Hydrochlorothiazide 25 Mg Tabs (Hydrochlorothiazide) .... 1/2 Tablet Once Daily For Swelling of Eyelids.  Allergies (verified): 1)  ! Penicillin 2)  ! Sulfa 3)  ! Morphine 4)  ! Septra 5)  ! * Contrast Dye  Past History:  Past Medical History: Last updated: 05/02/2008 UCD PALPITATIONS, HX OF (ICD-V12.50) Varicose veins near syncope tension headaches Nephrolithiasis, hx of (04/28/2008)    Physician roster:               Gyn - Dr. 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  The patient denies anorexia, fever, weight loss, vision loss, decreased hearing, hoarseness, chest pain, syncope, dyspnea on exertion, prolonged cough, headaches, hemoptysis, abdominal pain, melena, severe indigestion/heartburn, incontinence, muscle weakness, transient blindness, difficulty walking, unusual weight change, abnormal bleeding, enlarged lymph nodes, and angioedema.    Physical Exam  General:  overweight white female in no distress Head:  Normocephalic and atraumatic without obvious abnormalities. No apparent alopecia or balding. Eyes:  No corneal or conjunctival inflammation noted. EOMI. Perrla. Funduscopic exam benign, without hemorrhages, exudates or papilledema. Vision grossly normal. Ears:  External ear exam shows no significant lesions or deformities.  Otoscopic examination reveals clear canals, tympanic membranes are intact bilaterally without bulging, retraction, inflammation or discharge. Hearing is grossly normal bilaterally. Nose:  no external deformity and no external erythema.   Mouth:  Oral mucosa and oropharynx without lesions or exudates.  Teeth in good repair. Neck:  supple, full ROM, no thyromegaly, and no carotid bruits.   Chest Wall:  no deformities and no tenderness.   Breasts:  deferred to gyn Lungs:  Normal respiratory effort, chest expands symmetrically. Lungs are clear to  auscultation, no crackles or wheezes. Heart:  Normal rate and regular rhythm. S1 and S2 normal without gallop, murmur, click, rub or other extra sounds. Abdomen:  obese, soft, normal bowel sounds, and no guarding.   Genitalia:  deferred to gyn Msk:  normal ROM, no joint tenderness, no joint swelling, and no joint warmth.   Pulses:  2+ radial and DP pulses Extremities:  No clubbing, cyanosis, edema, or deformity noted with normal full range of motion of all joints.   Neurologic:   alert & oriented X3, cranial nerves II-XII intact, gait normal, and DTRs symmetrical and normal.   Skin:  turgor normal, color normal, no rashes, and no suspicious lesions.   Cervical Nodes:  no anterior cervical adenopathy and no posterior cervical adenopathy.   Psych:  Oriented X3, memory intact for recent and remote, normally interactive, and good eye contact.     Impression & Recommendations:  Problem # 1:  ANXIETY DEPRESSION (ICD-300.4) Doing well. Enjoys teaching. Stress levels are manageable  Problem # 2:  PALPITATIONS, HX OF (ICD-V12.50) Denies having any significant palpitations. Doing well at this time.  Problem # 3:  OBESITY, CLASS III (ICD-278.01) Discussed withMs.Madan that weight managment is THE most important thing to focus on in regard to her long-term health. She has worked with Multimedia programmer in the past with good success.  Plan - diet management: Smart food choices, CONTROL OF PORTION SIZES (meal of 1000 chews) and continued regular aerobic exercise - 30 minutes of exercise with heart rate raised to 180% of resting at least 3 times a week. Target - 200lbs  Goal - to loose 1 lb/month = 5year program!!  Problem # 4:  Preventive Health Care (ICD-V70.0) No interval illness or medical problems. Limited exam is normal. She is current with her gynecologist. Current for colorectal cancer screening with last study in '08. Last mammogram report on file is from '09 - she will obtain mammogram and have report forwarded. No interval problems with kidney stones. No persistent peripheral edema. Chart reviewed - 2D echo '10 was normal. Immunizations - Tetnus Feb '05, seasonal flu shot today.  In summary - a very nice woman who is medically stable. she accepts the challenge of better weight management and may return to weight watchers. She will return as needed or in 1 year.   Complete Medication List: 1)  Hydrochlorothiazide 25 Mg Tabs (Hydrochlorothiazide) .... 1/2 tablet once daily  for swelling of eyelids.   : Lenix Kucharski Note: All result statuses are Final unless otherwise noted.  Tests: (1) Lipid Panel (LIPID)   Cholesterol               181 mg/dL                   1-914     ATP III Classification            Desirable:  < 200 mg/dL                    Borderline High:  200 - 239 mg/dL               High:  > = 240 mg/dL   Triglycerides             66.0 mg/dL                  7.8-295.6     Normal:  <150 mg/dL     Borderline High:  213 - 199 mg/dL  HDL                       16.10 mg/dL                 >96.04   VLDL Cholesterol          13.2 mg/dL                  5.4-09.8   LDL Cholesterol      [H]  119 mg/dL                   1-47  CHO/HDL Ratio:  CHD Risk                             3                    Men          Women     1/2 Average Risk     3.4          3.3     Average Risk          5.0          4.4     2X Average Risk          9.6          7.1     3X Average Risk          15.0          11.0                           Tests: (2) BMP (METABOL)   Sodium               [H]  146 mEq/L                   135-145   Potassium                 4.3 mEq/L                   3.5-5.1   Chloride                  108 mEq/L                   96-112   Carbon Dioxide            31 mEq/L                    19-32   Glucose                   81 mg/dL                    82-95   BUN                       15 mg/dL                    6-21   Creatinine                0.7 mg/dL                   3.0-8.6   Calcium  9.0 mg/dL                   2.9-56.2   GFR                       99.37 mL/min                >60  Tests: (3) CBC Platelet w/Diff (CBCD)   White Cell Count          5.9 K/uL                    4.5-10.5   Red Cell Count            4.16 Mil/uL                 3.87-5.11   Hemoglobin                13.2 g/dL                   13.0-86.5   Hematocrit                38.6 %                      36.0-46.0   MCV                       92.6 fl                      78.0-100.0   MCHC                      34.2 g/dL                   78.4-69.6   RDW                       12.6 %                      11.5-14.6   Platelet Count            198.0 K/uL                  150.0-400.0   Neutrophil %              58.6 %                      43.0-77.0   Lymphocyte %              27.8 %                      12.0-46.0   Monocyte %                10.0 %                      3.0-12.0   Eosinophils%              3.0 %                       0.0-5.0   Basophils %               0.6 %  0.0-3.0   Neutrophill Absolute      3.5 K/uL                    1.4-7.7   Lymphocyte Absolute       1.7 K/uL                    0.7-4.0   Monocyte Absolute         0.6 K/uL                    0.1-1.0  Eosinophils, Absolute                             0.2 K/uL                    0.0-0.7   Basophils Absolute        0.0 K/uL                    0.0-0.1  Tests: (4) Hepatic/Liver Function Panel (HEPATIC)   Total Bilirubin           1.1 mg/dL                   1.1-9.1   Direct Bilirubin          0.2 mg/dL                   4.7-8.2   Alkaline Phosphatase      65 U/L                      39-117   AST                       31 U/L                      0-37   ALT                  [H]  37 U/L                      0-35   Total Protein             6.8 g/dL                    9.5-6.2   Albumin                   3.9 g/dL                    1.3-0.8  Tests: (5) TSH (TSH)   FastTSH                   1.77 uIU/mL                 0.35-5.50  Tests: (6) UDip Only (UDIP)   Color                     YELLOW       RANGE:  Yellow;Lt. Yellow   Clarity                   CLEAR                       Clear  Specific Gravity          >=1.030                     1.000 - 1.030   Urine Ph                  5.5                         5.0-8.0   Protein                   NEGATIVE                    Negative   Urine Glucose             NEGATIVE                    Negative   Ketones                    NEGATIVE                    Negative   Urine Bilirubin           NEGATIVE                    Negative   Blood                     NEGATIVE                    Negative   Urobilinogen              1.0                         0.0 - 1.0   Leukocyte Esterace        NEGATIVE                    Negative   Nitrite                   NEGATIVE                    Negative  Preventive Care Screening  Pap Smear:    Date:  03/17/2009    Results:  normal     Immunization History:  Influenza Immunization History:    Influenza:  historical (10/02/2009)

## 2010-02-18 NOTE — Assessment & Plan Note (Signed)
Summary: nose is sore, red eye (sat clinic)   Vital Signs:  Patient Profile:   55 Years Old Female Weight:      209 pounds Temp:     97.9 degrees F oral Pulse rate:   58 / minute Pulse rhythm:   regular BP sitting:   124 / 77  (left arm) Cuff size:   large  Vitals Entered By: Liane Comber (March 17, 2007 10:33 AM)                 Chief Complaint:  Pain L side of nose, slight headache, and red eye.  History of Present Illness: Had sore nose since the 18th, worsened since and woke up this AM with redness in her right eye, no facial pain but pain in bridge of right side of nose for awhile. No fever or chills and otherwise feels well. She has had some flashing light like sxs in lat periph vision for a while and has appt on the 18th for that with eye dr. She denies fever or warmth of the skin of the nasal bridge.    Current Allergies: ! PENICILLIN ! SULFA ! MORPHINE ! SEPTRA      Physical Exam  General:     Well-developed,well-nourished,in no acute distress; alert,appropriate and cooperative throughout examination, nontoxic. Head:     Normocephalic and atraumatic without obvious abnormalities. No apparent alopecia or balding. Eyes:     right eye with mild hypertrophy of the vessels of the lateral sclera, no discharge or cobblestoning seen.Left eye normal. Ears:     External ear exam shows no significant lesions or deformities.  Otoscopic examination reveals clear canals, tympanic membranes are intact bilaterally without bulging, retraction, inflammation or discharge. Hearing is grossly normal bilaterally. Nose:     External nasal examination shows no deformity or inflammation. Nasal mucosa are pink and moist without lesions or exudates. Nasal bridge on the left side at the imprint of the eyeglass support nildly tender but looks normal, no inflamm or fluctulance. Neck:     No deformities, masses, or tenderness noted. Lungs:     Normal respiratory effort, chest  expands symmetrically. Lungs are clear to auscultation, no crackles or wheezes.    Impression & Recommendations:  Problem # 1:  ACUTE ATOPIC CONJUNCTIVITIS (ICD-372.05) Assessment: New Really very mild of lateral side of right eye. Does not seem to be related to nasal sxs...does not appear to be cellulitis which worried me when talking to her on the phone.  Complete Medication List: 1)  Aspirin 81 Mg Tabs (Aspirin) .... Take one tablet once daily 2)  Toprol Xl 25 Mg Tb24 (Metoprolol succinate) .... Take one tablet once daily 3)  Lexapro 10 Mg Tabs (Escitalopram oxalate) .... Take one tablet once daily 4)  Ambien 10 Mg Tabs (Zolpidem tartrate) .... Take one tablet as needed at bedtime   Patient Instructions: 1)  Use hypotears as needed to hydrate the eyes. 2)  RTC if either entity becomes worse.    ]

## 2010-02-18 NOTE — Progress Notes (Signed)
Summary: RESULTS  Phone Note Call from Patient   Summary of Call: Patient is requesting results of labs.  Initial call taken by: Lamar Sprinkles, CMA,  October 31, 2008 3:33 PM  Follow-up for Phone Call        Labs on 10/6 and 10/11 are nl Follow-up by: Tresa Garter MD,  October 31, 2008 5:26 PM  Additional Follow-up for Phone Call Additional follow up Details #1::        Pt informed  Additional Follow-up by: Lamar Sprinkles, CMA,  October 31, 2008 5:43 PM

## 2010-02-18 NOTE — Progress Notes (Signed)
Summary: CALL A NURSE REPORT  Phone Note Other Incoming   Summary of Call: [facility] Triage Call Report Alicia [name] Operator: [id number] Triage Record Num: Call Date & Time: Alicia [name] Patient Name: 04/04/2009 9:34:30AM Alicia [name] PCP: [phone] Patient Phone: PCP Fax : [phone] Patient Gender: Female [birth year] Patient DOB: Practice Name: [facility] Reason for Call: LMP na C/o foamy brownish saliva onset this AM. Saliva is clear now though "bubbly." Speach is clear. No gum bleeding when brushing teeth today. ASA x 2 this week for a mild HA. Ofc eval on 04/03/09 for R ear and gum bleeding. No ear bleeding today. Labs drawn, results pending. Homecare advised per mouth/gum/HA protocols. Tooth, Gum and Jaw Symptoms Protocol(s) Used: Provide Home/Self Care Recommended Outcome per Protocol: Reason for Outcome: All other situations Care Advice: Avoid foods or liquids that cause symptoms.  ~ Call provider if symptoms continue, worsen, or new symptoms develop.  ~ Avoid chewing on affected side and hard foods (such as ice, candy, meat or popcorn).  ~ Eat soft foods (mashed or baked potatoes, Jello, cooked cereal, applesauce, bananas, eggs, cottage cheese, soups, pureed foods or "smoothies") until pain resolves or see provider.  ~ Consider acetaminophen as directed on label or by pharmacist/provider for pain or fever PRECAUTIONS: - If there is no history of liver disease, alcoholism, or intake of three or more alcohol drinks per day - If approved by provider during pregnancy or when breastfeeding - During pregnancy, acetaminophen should not be taken more than 3 consecutive days without telling provider - Do not exceed recommended dose or frequency  ~ SYMPTOM / CONDITION MANAGEMENT  ~ Page 1 of 1 04/04/2009 9:49:59AM [id number] Initial call taken by: Lamar Sprinkles, CMA,  April 06, 2009 11:37 AM

## 2010-02-18 NOTE — Assessment & Plan Note (Signed)
Summary: PER PHONE NOTE OK FOR FRIDAY/POST ER KIDNEY STONE/CD   Vital Signs:  Patient profile:   55 year old female Height:      65 inches Weight:      258.5 pounds O2 Sat:      94 % Temp:     97.5 degrees F oral Pulse rate:   62 / minute BP sitting:   120 / 82  (left arm) Cuff size:   large  Vitals Entered By: Zackery Barefoot CMA (May 02, 2008 5:15 PM)  Primary Care Provider:  Jacques Navy MD   History of Present Illness: Patient passed a kidney stone earlier this week. she had been seen in [place] at [facility]. By her report CT-stone protocol found no stones. She immediately after the study passed a stone, which she has brought with her for analysis.  She has had minimal residual discomfort. No hematuria.  Current Medications (verified): 1)  None  Allergies: 1)  ! Penicillin 2)  ! Sulfa 3)  ! Morphine 4)  ! Septra 5)  ! * Contrast Dye  Past History:  Past Medical History:    UCD    PALPITATIONS, HX OF (ICD-V12.50)    Varicose veins    near syncope    tension headaches    Nephrolithiasis, hx of (04/28/2008)                Physician roster:                  Gyn - Dr. Senaida Ores                  GS-   Review of Systems  The patient denies anorexia, fever, chest pain, abdominal pain, and hematuria.    Physical Exam  General:  Well-developed,well-nourished,in no acute distress; alert,appropriate and cooperative throughout examination Lungs:  Normal respiratory effort, chest expands symmetrically. Lungs are clear to auscultation, no crackles or wheezes. Heart:  normal rate and regular rhythm.     Impression & Recommendations:  Problem # 1:  NEPHROLITHIASIS, HX OF (ICD-V13.01) Patient has passed a stone. No residual stone on CT.  Plan: acidify urine - drink one lemon (or lemon conc) in water daily         stone to lab

## 2010-02-18 NOTE — Progress Notes (Signed)
Summary: pneum.  Phone Note Call from Patient Call back at Home Phone [phone]   Caller: Patient Summary of Call: Patient states she was previously dx w/ pneumonia. Patient did not follow-up with our office. Patient complains today of persistent coughing w/ yellowish sputum.  Patient was contacted and made aware of information to be shared w/ Dr. Debby Bud. Informed pt. I would call her back with Dr. Debby Bud decision for treatment. Initial call taken by: Beola Cord, CMA,  August 13, 2008 2:39 PM  Follow-up for Phone Call        Needs an OV tomorrow. Follow-up by: Jacques Navy MD,  August 13, 2008 3:18 PM  Additional Follow-up for Phone Call Additional follow up Details #1::        Called pt gave md advise transferred to scheduler to make appt to see md tomorrow Additional Follow-up by: Orlan Leavens,  August 13, 2008 3:21 PM

## 2010-02-18 NOTE — Progress Notes (Signed)
Summary: APT?  Phone Note Call from Patient Call back at [facility] Phone [phone]   Summary of Call: Patient is requesting f/u tomorrow. She went to [facility] and was dx w/pneumonia. Pt was advised to f/u in 2 days with PCP.  Initial call taken by: Lamar Sprinkles,  July 15, 2008 9:06 AM  Follow-up for Phone Call        OK Follow-up by: Jacques Navy MD,  July 15, 2008 9:23 AM

## 2010-02-18 NOTE — Assessment & Plan Note (Signed)
Summary: work in per triage/cd   Vital Signs:  Patient profile:   55 year old female Height:      65 inches Weight:      257.25 pounds BMI:     42.96 O2 Sat:      97 % on Room air Temp:     97.1 degrees F oral Pulse rate:   57 / minute BP sitting:   134 / 62  (left arm) Cuff size:   large  Vitals Entered By: Beola Cord, CMA (August 14, 2008 5:06 PM)  O2 Flow:  Room air CC: pneumonia f/u - yellowish sputum   Primary Care Elena Cothern:  Jacques Navy MD  CC:  pneumonia f/u - yellowish sputum.  History of Present Illness: Patient presents for new on-set of cough 1 week ago, mostly at night, productive of a yellowish sputum. Mild shortness of breath. No fever.   She was diagnosed with RLL PNA June 28th with positive CXR and all images and labs are reviewed: She was treated with Avelox. She returned to the ED June 30th for severe abdominal pain. She had follow-up CXR with no progression. No abdominal films done. ONly gneral chemistry panel which was unrearkable. No diagnosis was made. She was given pain medicine and did ok   Current Medications (verified): 1)  None  Allergies (verified): 1)  ! Penicillin 2)  ! Sulfa 3)  ! Morphine 4)  ! Septra 5)  ! * Contrast Dye PMH-FH-SH reviewed for relevance  Review of Systems  The patient denies anorexia, fever, weight loss, weight gain, vision loss, hoarseness, chest pain, syncope, peripheral edema, prolonged cough, hemoptysis, abdominal pain, severe indigestion/heartburn, muscle weakness, transient blindness, difficulty walking, and abnormal bleeding.    Physical Exam  General:  overweight white female in no distress Head:  normocephalic and atraumatic.   Eyes:  pupils equal, pupils round, corneas and lenses clear, and no injection.   Mouth:  throat clear Neck:  supple and full ROM.   Lungs:  normal respiratory effort, no intercostal retractions, no accessory muscle use, normal breath sounds, no crackles, and no wheezes.     Heart:  normal rate and regular rhythm.   Neurologic:  alert & oriented X3 and gait normal.   Skin:  turgor normal, color normal, and no rashes.   Cervical Nodes:  no anterior cervical adenopathy and no posterior cervical adenopathy.   Psych:  Oriented X3 and memory intact for recent and remote.     Impression & Recommendations:  Problem # 1:  URI (ICD-465.9) Patient made a good recovery from pneumonia. she now presents with history, symptoms and exam c/w  viral URI. No indication for additional antibiotics.  Plan: DM cough syrup of choice         APAP, Vit C 1500mg  once daily, ecchinacea          call for fever, productive sputum.

## 2010-02-18 NOTE — Progress Notes (Signed)
Summary: H/A x 2 weeks  Phone Note Call from Patient Call back at [facility] Phone [phone]   Summary of Call: Pt c/o headache. She describes it as a pain in the back of her head on the right side. Sometimes the pain goes down her neck. She says that it started around the time that she came in last, 4/2. It is not sensitive to touch but tylenol and heat helps some. Pt says that the pain goes is gone only when she is asleep. The pain varies in intensity.  Initial call taken by: Lamar Sprinkles,  May 02, 2007 10:58 AM  Follow-up for Phone Call        for HA try aleve  2 doses every 12 hours for HA. Follow-up by: Jacques Navy MD,  May 02, 2007 11:07 AM  Additional Follow-up for Phone Call Additional follow up Details #1::        Pt informed will call back with continued problems Additional Follow-up by: Lamar Sprinkles,  May 02, 2007 11:09 AM

## 2010-02-18 NOTE — Letter (Signed)
Summary: Results Follow-up Letter  [facility]  [address] [place], [place] [address]   Phone: [phone]  Fax: [phone]    04/06/2009  [address] [place], [place]  [address]  Dear Ms. Heritage,   The following are the results of your recent test(s):  Test     Result     CBC       normal Clotting tests   normal   _________________________________________________________  Please call for an appointment if needed _________________________________________________________ _________________________________________________________ _________________________________________________________  Sincerely,  Sanda Linger MD [place] [facility]           Appended Document: Results Follow-up Letter Pt req results and asked to left vm on home #, left vm

## 2010-03-26 ENCOUNTER — Encounter: Payer: Self-pay | Admitting: Internal Medicine

## 2010-03-26 ENCOUNTER — Telehealth (INDEPENDENT_AMBULATORY_CARE_PROVIDER_SITE_OTHER): Payer: Self-pay | Admitting: *Deleted

## 2010-03-26 ENCOUNTER — Ambulatory Visit (INDEPENDENT_AMBULATORY_CARE_PROVIDER_SITE_OTHER): Payer: BC Managed Care – PPO | Admitting: Internal Medicine

## 2010-03-26 DIAGNOSIS — I10 Essential (primary) hypertension: Secondary | ICD-10-CM | POA: Insufficient documentation

## 2010-03-29 ENCOUNTER — Telehealth: Payer: Self-pay | Admitting: Internal Medicine

## 2010-03-30 NOTE — Progress Notes (Signed)
  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 Please advise. Initial call taken by: Verdell Face,  March 26, 2010 8:09 AM  Follow-up for Phone Call        ok to work in this pm Follow-up by: Jacques Navy MD,  March 26, 2010 10:08 AM  Additional Follow-up for Phone Call Additional follow up Details #1::        pt coming @3 :30 pm, pt aware.  Additional Follow-up by: Verdell Face,  March 26, 2010 10:10 AM

## 2010-04-05 ENCOUNTER — Telehealth: Payer: Self-pay | Admitting: Internal Medicine

## 2010-04-06 NOTE — Progress Notes (Signed)
Summary: ELEVATED BP   Phone Note Call from Patient Call back at [facility] Phone [phone] Call back at Work Phone [phone]   Caller: Patient--[phone] at home/pt home. Call For: Dr Debby Bud  Summary of Call: Bp now is 158/98. Pt feels dizzy, does she need to stay on current bp med or have office visit? Initial call taken by: Verdell Face,  March 29, 2010 2:02 PM  Follow-up for Phone Call        Spoke w/pt - School nurse checked BP b/c pt felt dizzy and "woozy" when she stood up and walked around. BP 158/98. Advised she try to stand slowly and pause before walking. She also has slight h/a but no other complaints. BP this am at home 147/75.   She is taking her hctz and felt "better" this weekend but is worried about the dizzyness.   Follow-up by: Lamar Sprinkles, CMA,  March 29, 2010 2:15 PM  Additional Follow-up for Phone Call Additional follow up Details #1::        not likely to have volume shifts and dizzyness with HCTZ 25mg .   Recommend sticking with the medication at current dose.  She had been advised that the full benefit may take 2-3 weeks to see.   If the sympotoms persist she may d/c/ HCTZ and will start losartan 50mg  once daily  Additional Follow-up by: Jacques Navy MD,  March 29, 2010 5:29 PM    Additional Follow-up for Phone Call Additional follow up Details #2::    Pt informed  Follow-up by: Lamar Sprinkles, CMA,  March 29, 2010 6:57 PM

## 2010-04-06 NOTE — Assessment & Plan Note (Signed)
Summary: ELEV BP / APPT AT 3:30 /NWS   Vital Signs:  Patient profile:   55 year old female Height:      65 inches Weight:      264 pounds BMI:     44.09 O2 Sat:      96 % on Room air Temp:     98.4 degrees F oral Pulse rate:   60 / minute BP sitting:   144 / 98  (left arm) Cuff size:   large  Vitals Entered By: Bill Salinas CMA (March 26, 2010 3:52 PM)  O2 Flow:  Room air CC: pt here with c/o elevated bp/ ab   Primary Care Provider:  Jacques Navy MD  CC:  pt here with c/o elevated bp/ ab.  History of Present Illness: Patient presents for evaluatin of elevated BP. This was reported from other encounters. she has also check hrself and had some high readings. She has been asymptomatic: no diploplia, epistxis, vision changes. She previously was on HCTZ 12.5 mg initially started for periorbital edema. When this cleared she stopped the medication. She reports that she is under incredible stress at work as a variable that can affect her BP.  Current Medications (verified): 1)  Hydrochlorothiazide 25 Mg Tabs (Hydrochlorothiazide) .... 1/2 Tablet Once Daily For Swelling of Eyelids.  Allergies (verified): 1)  ! Penicillin 2)  ! Sulfa 3)  ! Morphine 4)  ! Septra 5)  ! 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  Physical Exam  General:  overweght white woman in no distress Head:  normocephalic and atraumatic.   Eyes:  pupils equal and pupils round.  Fundi - no exudates, hemorrahge or disk margin abnormalities Lungs:  normal respiratory effort and normal breath sounds.   Heart:  normal  rate and regular rhythm.   Neurologic:  alert & oriented X3, cranial nerves II-XII intact, and gait normal.   Skin:  turgor normal and color normal.   Psych:  Oriented X3, memory intact for recent and remote, good eye contact, tearful, and moderately anxious.     Impression & Recommendations:  Problem # 1:  ESSENTIAL HYPERTENSION, BENIGN (ICD-401.1) Patient with mild hypertension and no significant co-morbidities.  Plan - resume HCTZ at full therapeutic dose.           check BP in 2 weeks with adjustments to medications as indicated.  Her updated medication list for this problem includes:    Hydrochlorothiazide 25 Mg Tabs (Hydrochlorothiazide) .Marland Kitchen... 1 by mouth once daily for hypertension  Complete Medication List: 1)  Hydrochlorothiazide 25 Mg Tabs (Hydrochlorothiazide) .Marland Kitchen.. 1 by mouth once daily for hypertension Prescriptions: HYDROCHLOROTHIAZIDE 25 MG TABS (HYDROCHLOROTHIAZIDE) 1 by mouth once daily for hypertension  #30 x 12   Entered and Authorized by:   Jacques Navy MD   Signed by:   Jacques Navy MD on 03/26/2010   Method used:   Electronically to        [facility] # [id number]* (retail)       [address]       [place], [place]  [address]  Ph: [phone]       Fax: [phone]   RxID:   [id number]    Orders Added: 1)  Est. Patient Level III [84132]

## 2010-04-09 ENCOUNTER — Ambulatory Visit (INDEPENDENT_AMBULATORY_CARE_PROVIDER_SITE_OTHER): Payer: BC Managed Care – PPO | Admitting: *Deleted

## 2010-04-09 VITALS — BP 130/90

## 2010-04-09 DIAGNOSIS — I10 Essential (primary) hypertension: Secondary | ICD-10-CM

## 2010-04-09 NOTE — Progress Notes (Signed)
Pt here for BP check. Currently taking HCTZ 25mg  1 qd. Brought in home BP monitor, It was accurate in comparison to manual reading. Md ok w/BP reading, no changes needed

## 2010-04-15 NOTE — Progress Notes (Signed)
Summary: kidney pain  Phone Note Call from Patient   Caller: Patient [phone] Reason for Call: Acute Illness Summary of Call: 1) Please see [facility] Phone Note from earlier this morning. 2) Pt called back and stated same information w/additions of using a heating pad has helped; did not take medication yesterday; pain is more of "achiness" feeling. 3) Pt wants to know what to do abouot taking HCTZ and does she need an OV.? 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 Initial call taken by: Burnard Leigh [facility]),  April 05, 2010 8:38 AM  Follow-up for Phone Call        1.  continue comfort care along with aleve 2 tabs AM and PM if for continued pain. Wathc for GI irritation 2. HCTZ should be continued for BP 3. OV if pain seems to be getting worse - U/a prior to visit to r/o hematuria, e.g. kdiney stone.  Follow-up by: Jacques Navy MD,  April 05, 2010 9:08 AM  Additional Follow-up for Phone Call Additional follow up Details #1::        Informed Pt. Additional Follow-up by: Burnard Leigh [facility]),  April 05, 2010 5:52 PM

## 2010-04-15 NOTE — Progress Notes (Signed)
Summary: Call Report  Phone Note Other Incoming   Caller: [facility] Summary of Call: [facility] Triage Call Report Triage Record Num: [id number] Operator: Amy Head Patient Name: Alicia [name] Call Date & Time: 04/04/2010 7:05:17PM Patient Phone: [phone] PCP: Illene Regulus Patient Gender: Female PCP Fax : [phone] Patient DOB: 02-Nov-1955 Practice Name: [facility] Reason for Call: Pt/[name] calling and states that she was placed on HCTZ 1 week ago and today around noon, started with severe pain to Right kidney area. Afebrile. Has HX kidney stones and feels exactly like it did the last time. All emergent sxs per [facility] protocol r/o. Home care advice given. Advised to be seen within 4 hours at [facility] or ED/[place]. Protocol(s) Used: [facility] Recommended Outcome per Protocol: See Provider within 4 hours Reason for Outcome: History of kidney stones and has similar symptoms now Care Advice:  ~ Go to the ED IMMEDIATELY if repeated vomiting or severe pain occurs.  ~ Strain urine through cheesecloth or gauze. Take any strained materials to provider for evaluation.  ~ Tell provider medical history of renal disease; especially if have only one kidney. During pregnancy, call provider if temperature is 100 F (37.7 C) or greater OR any temperature elevation for 3 days even while taking acetaminophen.  ~ Increase intake of fluids. Try to drink 8 oz. (.2 liter) every hour when awake, including unsweetened cranberry juice, unless on restricted fluids for other medical reasons. Take sips of fluid or eat ice chips if nauseated or vomiting.  ~  ~ Call provider immediately if urination is painful or decreased amount of urine output.  ~ SYMPTOM / CONDITION MANAGEMENT  ~ CAUTIONS Analgesic/Antipyretic Advice - Acetaminophen: Consider acetaminophen as directed on label or by pharmacist/provider for pain or fever PRECAUTIONS: - Use if there is no history of liver disease,  alcoholism, or intake of three or more alcohol drinks per day - Only if approved by provider during pregnancy or when breastfeeding - During pregnancy, acetaminop Initial call taken by: Margaret Pyle, CMA,  April 05, 2010 7:49 AM

## 2010-04-21 ENCOUNTER — Other Ambulatory Visit: Payer: Self-pay | Admitting: Obstetrics and Gynecology

## 2010-04-21 DIAGNOSIS — Z1231 Encounter for screening mammogram for malignant neoplasm of breast: Secondary | ICD-10-CM

## 2010-04-26 LAB — CBC
HCT: 42.2 % (ref 36.0–46.0)
Hemoglobin: 14.3 g/dL (ref 12.0–15.0)
MCHC: 34 g/dL (ref 30.0–36.0)
MCV: 92.7 fL (ref 78.0–100.0)
Platelets: 191 10*3/uL (ref 150–400)
RBC: 4.55 MIL/uL (ref 3.87–5.11)
RDW: 12.8 % (ref 11.5–15.5)
WBC: 6.2 10*3/uL (ref 4.0–10.5)

## 2010-04-26 LAB — DIFFERENTIAL
Basophils Absolute: 0 10*3/uL (ref 0.0–0.1)
Basophils Relative: 0 % (ref 0–1)
Eosinophils Absolute: 0 10*3/uL (ref 0.0–0.7)
Eosinophils Relative: 0 % (ref 0–5)
Lymphocytes Relative: 13 % (ref 12–46)
Lymphs Abs: 0.8 10*3/uL (ref 0.7–4.0)
Monocytes Absolute: 0.8 10*3/uL (ref 0.1–1.0)
Monocytes Relative: 12 % (ref 3–12)
Neutro Abs: 4.6 10*3/uL (ref 1.7–7.7)
Neutrophils Relative %: 75 % (ref 43–77)

## 2010-04-26 LAB — POCT I-STAT, CHEM 8
BUN: 10 mg/dL (ref 6–23)
Calcium, Ion: 1.03 mmol/L — ABNORMAL LOW (ref 1.12–1.32)
Chloride: 104 mEq/L (ref 96–112)
Creatinine, Ser: 0.8 mg/dL (ref 0.4–1.2)
Glucose, Bld: 104 mg/dL — ABNORMAL HIGH (ref 70–99)
HCT: 41 % (ref 36.0–46.0)
Hemoglobin: 13.9 g/dL (ref 12.0–15.0)
Potassium: 3.6 mEq/L (ref 3.5–5.1)
Sodium: 137 mEq/L (ref 135–145)
TCO2: 24 mmol/L (ref 0–100)

## 2010-04-28 ENCOUNTER — Telehealth: Payer: Self-pay | Admitting: Internal Medicine

## 2010-04-28 DIAGNOSIS — I10 Essential (primary) hypertension: Secondary | ICD-10-CM

## 2010-04-28 NOTE — Telephone Encounter (Signed)
Full dose HCTZ started early March. OK to have metabolic panel - 542.7 order entered

## 2010-04-28 NOTE — Telephone Encounter (Signed)
Pt wants to know when she needs to return for bp check, office visit & labs? She thought Dr Debby Bud may want her to have calcium checked. Please advise.

## 2010-04-29 NOTE — Telephone Encounter (Signed)
Pt informed

## 2010-04-30 ENCOUNTER — Ambulatory Visit: Payer: BC Managed Care – PPO

## 2010-05-03 ENCOUNTER — Encounter: Payer: Self-pay | Admitting: Internal Medicine

## 2010-05-03 ENCOUNTER — Encounter (INDEPENDENT_AMBULATORY_CARE_PROVIDER_SITE_OTHER): Payer: BC Managed Care – PPO

## 2010-05-03 ENCOUNTER — Other Ambulatory Visit (INDEPENDENT_AMBULATORY_CARE_PROVIDER_SITE_OTHER): Payer: BC Managed Care – PPO

## 2010-05-03 ENCOUNTER — Ambulatory Visit (INDEPENDENT_AMBULATORY_CARE_PROVIDER_SITE_OTHER): Payer: BC Managed Care – PPO | Admitting: Internal Medicine

## 2010-05-03 VITALS — BP 128/90 | HR 59 | Temp 97.8°F | Wt 268.0 lb

## 2010-05-03 DIAGNOSIS — R000 Tachycardia, unspecified: Secondary | ICD-10-CM

## 2010-05-03 DIAGNOSIS — I471 Supraventricular tachycardia: Secondary | ICD-10-CM

## 2010-05-03 DIAGNOSIS — I10 Essential (primary) hypertension: Secondary | ICD-10-CM

## 2010-05-03 LAB — COMPREHENSIVE METABOLIC PANEL
ALT: 35 U/L (ref 0–35)
AST: 29 U/L (ref 0–37)
Albumin: 3.7 g/dL (ref 3.5–5.2)
Alkaline Phosphatase: 65 U/L (ref 39–117)
BUN: 17 mg/dL (ref 6–23)
CO2: 31 mEq/L (ref 19–32)
Calcium: 9 mg/dL (ref 8.4–10.5)
Chloride: 104 mEq/L (ref 96–112)
Creatinine, Ser: 0.6 mg/dL (ref 0.4–1.2)
GFR: 104.62 mL/min (ref >60.00)
Glucose, Bld: 76 mg/dL (ref 70–99)
Potassium: 4.2 mEq/L (ref 3.5–5.1)
Sodium: 142 mEq/L (ref 135–145)
Total Bilirubin: 1.3 mg/dL — ABNORMAL HIGH (ref 0.3–1.2)
Total Protein: 6.7 g/dL (ref 6.0–8.3)

## 2010-05-03 NOTE — Progress Notes (Signed)
  Subjective:    Patient ID: Alicia [name], female    DOB: 1955-09-12, 55 y.o.   MRN: [id number]  HPI Patinet presents for a problem with bursts of rapid heart rate. She will have an episode that can last up to 30 minutes, with 4 episodes yesterday and one episode this AM. She reports that she did feel light-headed/dizzy but did not feel syncopal. She did not have any chest pain or discomfort. She has otherwise felt like she had a virus with loss of energy. She has developed a fever blister on her lower lip.     Review of Systems  Constitutional: Negative for chills, activity change, fatigue and unexpected weight change.       [Feels weak and "foggy" like she has a brewing viral infection HENT: Negative.   Eyes: Negative.   Respiratory: Negative.  Negative for cough, chest tightness and shortness of breath.   Cardiovascular:       [See HPI - palpitations over the last 36 hours Gastrointestinal: Negative.   Genitourinary: Negative.   Musculoskeletal: Negative.   Skin: Negative.   Neurological: Negative.   Hematological: Negative.   Psychiatric/Behavioral: Negative.        Objective:   Physical Exam  Constitutional: She is oriented to person, place, and time.       Overweight white female.  HENT:  Head: Normocephalic and atraumatic.  Eyes: Conjunctivae and EOM are normal. Pupils are equal, round, and reactive to light.  Neck: Normal range of motion. No thyromegaly present.  Cardiovascular: Normal rate and regular rhythm.  Exam reveals no friction rub.   No murmur heard. Pulmonary/Chest: Effort normal and breath sounds normal.  Musculoskeletal: Normal range of motion. She exhibits no edema.  Neurological: She is alert and oriented to person, place, and time. She has normal reflexes.          Assessment & Plan:  1. Tachyrhythmia - patient with several episodes over the past 36 hours of tachycardia with minor symptoms. Suspect she is experiencing PSVT. No chest pain or  signs to suggest ischemia.  Plan - same referral to holter lab for 24 hour holter monitor.  2. Hypertension - good control at this time on HCTZ alone.

## 2010-05-06 ENCOUNTER — Encounter: Payer: Self-pay | Admitting: Internal Medicine

## 2010-05-07 ENCOUNTER — Ambulatory Visit
Admission: RE | Admit: 2010-05-07 | Discharge: 2010-05-07 | Disposition: A | Discharge status: Home / Self Care | Payer: BC Managed Care – PPO | Source: Ambulatory Visit | Attending: Obstetrics and Gynecology | Admitting: Obstetrics and Gynecology

## 2010-05-07 DIAGNOSIS — Z1231 Encounter for screening mammogram for malignant neoplasm of breast: Secondary | ICD-10-CM

## 2010-05-09 ENCOUNTER — Telehealth: Payer: Self-pay | Admitting: Internal Medicine

## 2010-05-09 NOTE — Telephone Encounter (Signed)
Holter monitor revealed basically a normal rhythm with occasional pvcs and tachycardia. No worrisome abnormal rhythms.

## 2010-05-11 NOTE — Telephone Encounter (Signed)
Informed pt of results.

## 2010-06-01 NOTE — Assessment & Plan Note (Signed)
[facility]                                 ON-CALL NOTE   NAME:[name], Alicia CORROW                  MRN:          [id number]  DATE:07/14/2008                            DOB:          1955/08/13    DATE AND TIME:  On July 14, 2008, at 6:15 p.m.   PHONE NUMBER:  [phone]   Dr. Debby Bud is the regular doctor   CHIEF COMPLAINT:  Flu symptoms and feeling sick.   The patient said yesterday she started feeling dizzy in the morning.  She ran a temperature of 100.6 that went up to 101.8.  Today, she feels  chills and aches, has had vomiting and diarrhea constantly.  She cannot  keep anything down.  She has no blood in her stool, but is having some  abdominal cramping.  She just started coughing and it feels like she has  a lot of congestion in her chest.  She seems to be getting worse and  worse.  I advised her to go onto the emergency room urgent care for  evaluation now, which is what she is going to do.     Marne A. Tower, MD  Electronically Signed    MAT/MedQ  DD: 07/14/2008  DT: 07/15/2008  Job #: [id number]

## 2010-06-01 NOTE — Assessment & Plan Note (Signed)
[facility]                                 ON-CALL NOTE   NAME:Alicia [name], Alicia [name]                        MRN:          [id number]  DATE:03/17/2007                            DOB:          1955/10/02    TIME OF CALL:  8:12 a.m.   PHONE NUMBER:  [phone].   OBJECTIVE:  The patient complains of right eye and side of her face pain  and swollen.  She thinks she has a sinus infection and would like to be  seen.  Was told to come in to be seen at the [facility] on  Saturday morning in [place].   PRIMARY CARE [facility]:  Dr. Debby Bud.  Home office is [place].     Arta Silence, MD  Electronically Signed    RNS/MedQ  DD: 03/17/2007  DT: 03/18/2007  Job #: [id number]

## 2010-06-04 NOTE — Discharge Summary (Signed)
[facility]  Patient:    Alicia [name] Visit Number: [id number] MRN: [id number]          Service Type: GYN Location: [id number] Attending Physician:  Madelyn Flavors Dictated by:   Guy Sandifer Ar[place]ta [place], M.D. Admit Date:  07/31/1998 Disc. Date: 07/31/98   CC:         Dr. Larey Dresser             Dr. Dorena Cookey             Dr. Burnice Logan             Dr. Cliffton Asters                           Discharge Summary  ADMITTING DIAGNOSIS:          Probable pyelonephritis.  DISCHARGE DIAGNOSIS:          Hemoperitoneum.  REASON FOR DISCHARGE:         This patient is a 55 year old married white female who underwent laparoscopically assisted vaginal hysterectomy on 07/17/98.  She as discharged home on postoperative day number two doing well.  She had been doing  well until the day of admission, having regular bowel movements and tolerating regular diet and ambulating.  While walking on the day of admission, she developed right lower quadrant and right lower back pain which was unrelieved with Tylox.  She then developed some nausea and vomiting and noticed mucus in her bowel movements 3-4 times on the day of admission.  She had had a similar episode of ain 2-3 weeks preoperatively that was evaluated at the [facility].  It was a presumed small kidney stone, although abdominopelvic CAT scan failed to find any abnormalities.   Apparently her pain had totally resolved before she was discharged home from the emergency room.  When evaluated in triage at [facility], her temperature was initially 101.5 and then progressed to 101.9. Straight catheterized urine specimen was positive for nitrites, red cells, white cells and a few bacteria.  Hemoglobin was 8.0 (hemoglobin was 9.0 on discharge after surgery), white count was 7.8 and platelets 278,000.  HOSPITAL COURSE:              Patient was admitted for a  probable pyelonephritis and was given 2 grams of Ancef IV and IV fluids.  Her fever continued to rise to 102 approximately 2 hours after the dose of Ancef.  She was noted to have continued complaints of right lower quadrant and right lower back pain.  The abdomen showed mild lower quadrant tenderness without rebound, no masses, soft with bowel sounds positive in all quadrants.  Lungs are clear.  Blood pressure and pulse were stable and she had some right flank tenderness but no classic CVA tenderness. Telephone consultation with Dr. Larey Dresser of Urology was carried out.  He recommended an IVP for evaluation at that time.  The IVP revealed normal kidneys, ureters and bladder. There was a mass effect pressing on the bladder suggestive of a hematoma. The abdominal ________________ for the IVP was normal without evidence of obstruction.  The patients antibiotics were changed to Ceftin 2 gm q 12h.  IV fluids were continued and she was given Tylenol suppositories p.r.n. for temperature of 101 or greater.  PCA Demerol was also started.  The following morning she was having relief of her pain with the PCA.  She was complaining of  continued nausea.  Temperature remained 102 degrees.  Blood pressure and pulse remained stable.  Lungs were clear.  Abdomen remained soft with good bowel sounds in all quadrants.  Extremities were negative.  Chest x-ray revealed a small amount of atelectasis at the right base but was otherwise negative.  Hemoglobin was noted to be 7.8 and white count at 11.6.  At that time, it was felt that the patient ad an intra-abdominal hematoma from a probable delayed postoperative bleed.  Based on abdominal exams and x-rays, there was no evidence of bowel obstruction. Possible pelvic abscess was also entertained.  Antibiotics were continued to allow reasonable time for clinical response.  PAS stockings and incentive spirometry as ordered and a repeat CBC also  ordered for noon that day.  On the afternoon of 07/23/98, the right lower quadrant pain continued with some nausea.  She was up to the bathroom x 1 without significant dizziness.  She was unable to void secondary to bladder spasm and straight catheterization was positive for 250 cc of concentrated urine.  Temperature was 101 degrees, abdominal exam remained mildly tender both otherwise benign and hemoglobin was stable at 8.1 with a white count of 9.5.  She was given increased IV fluids, Detrol p.r.n. was started to help relieve bladder spasm and ambulation was encouraged.  The plan at that time was to perform a abdominopelvic CAT scan the following day if there was not an adequate clinical response to antibiotics.  The morning of 07/24/98, nausea continued and she began to have more diarrhea.  She was having some pelvic abdominal pains. Temperature was 100.6.  Lungs remained clear and abdominal exam remained benign.  A CAT scan was ordered.  The patient was unable to take p.o. contrast and was given rectal  contrast and IV contrast.  Results were significant for fluid in the left pericolic gutter or presenting hematoma or ascites.  It communicated readily with pelvic fluid.  Small bilateral pleural effusions were noted.  Pelvic CT was positive for large amount of free flowing fluid.  There was a singular small locule of gas probably consistent with the postoperative state.  That evening, the patient was comfortable and got some sleep and was taking a few bites of food that evening. On the morning of 07/25/98, the diarrhea continued.  GI consultation with Dr. Dorena Cookey was obtained.  The patient was noted to have a marked increase in her abdominal and flank pain.  She was not using her PCA Demerol secondary to nausea and what she felt was poor pain relief.  Profuse diarrhea, sometimes 3-4 times n hour continued.  The patient was given IV Dilaudid with Phenergan which  gave excellent pain relief.  Temperature at 11:40 a.m. was 101.2 with stable vital  signs.  The abdomen was slightly distended but soft.  Dr. Madilyn Fireman evaluation included a differential diagnosis of a postoperative bile leak, hematoma, Clostridium difficile, or other bowel obstructions, Crohns disease or peptic ulcer disease with a confined perforation.  Stool for C diff and ovum parasites were ordered.  That day hemoglobin remained stable at 7.4, white count was 11.2. Because of the concern for possible pyelonephritis as well as colitis or other I infection on the 7th, the Ceftin was discontinued and the patient was started on IV gentamycin and IV Flagyl due to the fact that she was unable to take p.o. intake. A portable KUB film was normal.  A general surgery consultation was requested. The  case was again reviewed with Dr. Madilyn Fireman over the telephone.  Because of the continued improvement in p.o. intake, a repeat CT with p.o. contrast was ordered. This subsequently returned with no evidence of bowel obstruction or small bowel  injury.  The fluid communicating with with pericolic gutter as well as the pelvis remained essentially unchanged.  Small bilateral pleural effusions and mild atelectasis was again noted as well.  On the 9th, the patient continued to have  good pain relief with Dilaudid and was tolerating liquids and some bites of food. She had a bowel movement that morning.  She was having some vaginal bleeding with her bowel movement.  Temperature was 99.5 and vital signs remained stable. Repeat hemoglobin was 6.8 and white count was 9.6.  Diarrhea was decreasing and the studies on the stool had been negative to date.  Urine culture ordered through triage on admission was not sent.  A repeat urine culture had been ordered and as pending at that time.  On 07/27/98 the patient was feeling better, tolerating breakfast with markedly decreased diarrhea but no normal bowel  movement.  She was ambulating.  She was again noting some blood in her urine and there was some confusion with the patient and nursing as to whether this was vaginal or urethral in origin.  Temperature was 100.4.  Hemoglobin was stable at 6.8, white count 10.1, potassium was 3.0 and was noted.  IV gentamycin and Flagyl were continued.  In he afternoon of 07/27/98, the patient was found by her nurse sitting on the edge of her bed, leaning forward, complaining of shortness of breath.  An oxygen saturation in the 90s was obtained by the pulse oximeter.  EKG and arterial blood gases were ordered.  The episode of shortness of breath was short lived and lasted only a ew minutes.  EKG was normal sinus rhythm and arterial blood gas on room air revealed a pO2 of 68 and a PCO2 of 35.  This was discussed with Dr. Lajuana Ripple in Radiology and Dr. Shan Levans of Pulmonary and Critical Care Medicine.  They  both agreed that spiral CT was an indicated procedure in this patient who did not want radioactive studies with the VQ scan and who had evidence of hemoperitoneum and was not a good candidate for anticoagulation overnight pending use of VQ scan the following day.  Therefore chest x-ray and spiral chest CT was ordered late hat afternoon immediately after I learned of this episode.  I then met the patient n radiology prior to her plane chest film and prior to her CT.  She was perfectly at ease and comfortable in the wheelchair.  Lungs were clear.  I discussed the need to rule out a pulmonary embolism given her low oxygen level on arterial blood gas.  She stated that she understood and agreed entirely with the procedure. Approximately one hour later, I received a phone call from the radiologist stating that the patient was too uncomfortable lying on the CT table and wanted to go back to her room.  The radiologist went home and the patient went to her room.  I then saw the patient  in her room that evening at approximately 07:25 p.m. where the patient was calm without complaints of pain or shortness of breath. Temperature was 100.9, vital signs were stable.  I again reviewed with the patient, her husband and her sister the need to perform the spiral CT to rule out pulmonary embolism  because of the potentially  life-threatening nature of recurrent pulmonary embolism. I also reviewed with the patient and her family her entire course, the rationale for each step and testing and treatment.  This had been done several times already on this admission.  Also discussed the telephone conversation that I had had with Dr. Delford Field and the fact that I was attempting to obtain her CT in an efficient fashion and that Dr. Delford Field was prepared to see immediately should she have any  further respiratory distress or an abnormal CT.  The spiral CT was subsequently  obtained and was negative for any evidence of pulmonary embolism.  Infectious disease consultation was then obtained that evening.  The following morning, she was noted to be at a T max of 101.4 and a temperature of 100.9.  Abdominal pain  continued relieved with pain meds.  Her diarrhea had stopped and her nausea and  vomiting had stopped and she now was having no bowel movements.  Exam remained benign.  Hemoglobin was stable at 7.2 and white count 13.8.  Potassium had increased to 3.2.  Infectious Disease consultation was again obtained and the patient was felt to be having a moderate improvement symptomatically.  Later that afternoon, the patient complained of a large amount of blood in the toilet. She was taken to the Maternity Admissions Unit where speculum exam was done.  The cuff was noted to be healing well with a small less than 1 cm area draining the hematoma from above.  Later that evening, the patients temperature was noted to be 99.2 nd she had no bowel movement in four days and Colace was ordered.   The following morning on 07/29/98, right abdominal pain continued to wax and wane.  She had a T max of 102.1, increasing hemoglobin of 7.7 and a white count of 15.  Infectious  Disease recommended radiologic aspiration of the peritoneal fluid as well as an  empiric change to Levaquin with the discontinuation of the gentamycin and continuing the oral Flagyl.  The patient then underwent ultrasound guided  paracentesis.  This was productive of approximately 5 cc of fluid.  Gram stain subsequently returned as negative for organisms.  On the morning of 07/30/98, the patient was in the bathroom accompanied by hospital staff when she had a "weak"  episode and had to be returned to her bed.  There was no loss of consciousness nd no head trauma.  The patient was afebrile.  She described poor appetite and just did not feel like eating.  She was also not ambulating well.  The increased risk of DVT and pneumonia was discussed with the patient.  IV fluids were started.  The  possibility of PPN for nutrition was discussed if she continued not to eat. Pulmonary treatments with nebulizers around the clock were instituted. Ambulation was encouraged and nutrition was encouraged.  On the day of discharge, the patient has been afebrile for greater than 36 hours.  She is ambulating well.  She is having normal bowel movements.  Pain continues, although it is markedly improved and is relieved with oral narcotics.  She is taking adequate nutrition.  LABORATORIES:                 On 07/22/98, hemoglobin is 8.0, white count is 7.8, and platelet count is 278,000.  In and out urine specimen greater than 80 ketones, positive for urine nitrites, wbcs 0-5, rbcs 11-20, C bacteria, stool O & P negative x 2, stool culture negative C diff.  On  07/29/98, WBC 15.4, hemoglobin  7.7, platelets 461,000, urine culture negative.  DIET:                         Regular as tolerated.  ACTIVITY:                     No  lifting, no vaginal entry, no operation of automobiles.  MEDICATIONS:                  Levaquin 500 mg #7 1 p.o. q.d. with no refills, Flagyl 500 mg #10 1 p.o. b.i.d. with no refills, Percocet #30 1-2 q 4h p.r.n., Nu-Iron #30 1 p.o. b.i.d. with 1 refill, Ambien 10 mg #14, no refill, multivitamins daily, Colace as needed.  FOLLOWUP:                     In three days in the office. Dictated by:   Guy Sandifer Arleta Creek, M.D. Attending Physician:  Madelyn Flavors DD:  07/31/98 TD:  08/01/98 Job: [id number]

## 2010-07-19 ENCOUNTER — Other Ambulatory Visit: Payer: Self-pay | Admitting: Internal Medicine

## 2010-07-19 ENCOUNTER — Ambulatory Visit (INDEPENDENT_AMBULATORY_CARE_PROVIDER_SITE_OTHER): Payer: BC Managed Care – PPO | Admitting: Internal Medicine

## 2010-07-19 ENCOUNTER — Encounter: Payer: Self-pay | Admitting: Internal Medicine

## 2010-07-19 ENCOUNTER — Ambulatory Visit (HOSPITAL_COMMUNITY)
Admission: RE | Admit: 2010-07-19 | Discharge: 2010-07-19 | Disposition: A | Discharge status: Home / Self Care | Payer: Federal, State, Local not specified - PPO | Source: Ambulatory Visit | Attending: Internal Medicine | Admitting: Internal Medicine

## 2010-07-19 DIAGNOSIS — Z90710 Acquired absence of both cervix and uterus: Secondary | ICD-10-CM | POA: Insufficient documentation

## 2010-07-19 DIAGNOSIS — R109 Unspecified abdominal pain: Secondary | ICD-10-CM

## 2010-07-19 DIAGNOSIS — R1031 Right lower quadrant pain: Secondary | ICD-10-CM | POA: Insufficient documentation

## 2010-07-19 DIAGNOSIS — K7689 Other specified diseases of liver: Secondary | ICD-10-CM | POA: Insufficient documentation

## 2010-07-19 DIAGNOSIS — Z9089 Acquired absence of other organs: Secondary | ICD-10-CM | POA: Insufficient documentation

## 2010-07-19 DIAGNOSIS — Q619 Cystic kidney disease, unspecified: Secondary | ICD-10-CM | POA: Insufficient documentation

## 2010-07-19 DIAGNOSIS — R112 Nausea with vomiting, unspecified: Secondary | ICD-10-CM | POA: Insufficient documentation

## 2010-07-19 DIAGNOSIS — M47814 Spondylosis without myelopathy or radiculopathy, thoracic region: Secondary | ICD-10-CM | POA: Insufficient documentation

## 2010-07-19 NOTE — Progress Notes (Signed)
Subjective:    Patient ID: Alicia [name], female    DOB: 07-05-1955, 55 y.o.   MRN: [id number]  HPI Mrs. Baldridge presents with two day h/o abdominal pain. She reports that her pain started Saturday with pain in the RLQ. She then developed pain in the RUQ with radiation to the scapular region. She has had nausea and anorexia, she has had several loose stools but no blood or mucus in the stool and normal color to the fecal material. She has had no fever or chills. She has a h/o nephrolithiasis and is s/p lap chole. Pain at this time is persistent but less severe.  \ Past Medical History  Diagnosis Date  . Palpitations   . Varicose veins   . Near syncope   . Chronic tension headaches   . Nephrolithiasis     history of   Past Surgical History  Procedure Date  . Abdominal hysterectomy   . Cholecystectomy   . Tonsillectomy    No family history on file. History   Social History  . Marital Status: Married    Spouse Name: N/A    Number of Children: 3  . Years of Education: 16   Occupational History  . teacher [employer]   Social History Main Topics  . Smoking status: Never Smoker   . Smokeless tobacco: Never Used  . Alcohol Use: Not on file  . Drug Use: No  . Sexually Active: Yes -- Female partner(s)   Other Topics Concern  . Not on file   Social History Narrative   Alicia [name] state- BA. Married '79. 2 daughters- '82, '93, 1 son- '87. 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 SO- good health       Review of Systems  Constitutional: Negative for fever, chills, diaphoresis and activity change.  HENT: Negative.   Eyes: Negative.   Respiratory: Negative for cough, chest tightness, shortness of breath and wheezing.   Cardiovascular: Positive for leg swelling. Negative for chest pain and palpitations.  Gastrointestinal: Positive for nausea, abdominal pain, diarrhea and abdominal distention.  Genitourinary: Positive for flank pain. Negative for dysuria, frequency, hematuria and  difficulty urinating.  Musculoskeletal: Negative.   Neurological: Negative.   Hematological: Negative.   Psychiatric/Behavioral: Negative.        Objective:   Physical Exam Vitals noted - afebrile, BP stable Gen'l - obese white woman in mild discomfort HEENT - Brent/AT, C&S clear without icterus Neck - supple.Pulmonary - no increased WOB, lungs are clear to A&P Cor - 2+ radial pulse, RRR Abdomen - obese, hypoactive BS RLQ, otherwise absent BS. No guarding or rebound. Greatest tenderness in the RUQ to deep palpation and to percussion over the lower rib cage; tender to deep palpation RLQ. Back - non-tender.  Neuro - non-focal.        Assessment & Plan:  Abdominal pain - Exam suggestive of RUQ as primary location of pain - in absence of gallbladder question of CBD stone leading to ileus and pain. Also need to consider atypical presentation of recurrent nephrolithiasis.   Plan - CT abdomen/pelvis with contrast tonight given her tenderness, possible ileus and Nausea with further plans to follow. She is going directly to [facility]- radiology department.   Addendum: CT abdomen and pelvis is normal except for hepatic steatosis.  Origin of pain uncertain but may be viral gastroenteritis vs pain from steatosis.  Plan - will follow up with patient by telephone Tuesday, July 3rd           APAP  for pain

## 2010-07-20 ENCOUNTER — Telehealth: Payer: Self-pay | Admitting: *Deleted

## 2010-07-20 NOTE — Telephone Encounter (Signed)
Called - no answer at 12:53 7/3. Will try later.

## 2010-07-20 NOTE — Telephone Encounter (Signed)
Patient requesting a call from MD to discuss CT from yesterday.

## 2010-07-23 NOTE — Telephone Encounter (Signed)
Already called patient and saw her in the flesh

## 2010-10-12 ENCOUNTER — Encounter: Payer: Self-pay | Admitting: Internal Medicine

## 2010-11-22 ENCOUNTER — Telehealth: Payer: Self-pay | Admitting: *Deleted

## 2010-11-22 ENCOUNTER — Ambulatory Visit (INDEPENDENT_AMBULATORY_CARE_PROVIDER_SITE_OTHER): Payer: Federal, State, Local not specified - PPO | Admitting: Internal Medicine

## 2010-11-22 VITALS — BP 132/86 | HR 65 | Temp 98.3°F | Wt 265.0 lb

## 2010-11-22 DIAGNOSIS — R6884 Jaw pain: Secondary | ICD-10-CM

## 2010-11-22 MED ORDER — TRAMADOL HCL 50 MG PO TABS: ORAL_TABLET | ORAL | Status: DC

## 2010-11-22 NOTE — Progress Notes (Signed)
  Subjective:    Patient ID: Alicia [name], female    DOB: 1955-10-28, 55 y.o.   MRN: [id number]  HPI Mrs. Grenfell presents with a week long history of pain at the right lower jaw that has been progressive. The pain is constant, unlike prior episodes of TMJ. She is getting ready to have plaque scaling and had a series of x-rays including a panorex. She has had no fever, chills, otalgia, drainage from the eary. No pain with chewing, no sore throat or mouth pain.  I have reviewed the patient's medical history in detail and updated the computerized patient record.    Review of Systems .menrosb     Objective:   Physical Exam Vitals - normal w/o fever Gen'l- obese white woman in no acute distress HEENT- oropharynx w/o lesions, no tenderness of the teeth, no buccal lesions, no tenderness of the gum. The EAC is clear, the right TMJ clicks but is not tender to palpation. There is a hard nodule on the right mandible that is very tender Node - no adenopathy in the submandibular or cervical region.       Assessment & Plan:  Jaw pain- with a hard nodule. No evidence of infection. She saw her dentist/oral surgeon who doesn't know what this is either.  Plan - ENT referral.

## 2010-11-22 NOTE — Telephone Encounter (Signed)
Pt saw  Oral surgeon and they suggested she see ENT MD, she just needs referral put in

## 2010-11-22 NOTE — Telephone Encounter (Signed)
Pt states she is having pain on the right side of her face.  Begins in jaw and now is radiating to cheek and ear.  Appt is scheduled for today at 11:15.

## 2010-11-22 NOTE — Patient Instructions (Signed)
Pain at the right jaw with a hard nodule. No signs of infection. The TMJ on the right clicks but is not tender. No oral lesions. Suspect that this is more in the dental area. No evidence of a bacterial infection, thus, no antibiotics. For Pain - see your dentist. Apply heat; tramadol 1 or 2 tablets every 6 hours. Call back for fever, chills, unrelieved pain.

## 2010-11-23 ENCOUNTER — Telehealth: Payer: Self-pay | Admitting: Internal Medicine

## 2010-11-23 NOTE — Telephone Encounter (Signed)
Ami Bullins, CMA 11/22/2010 4:00 PM Signed  Pt saw Oral surgeon and they suggested she see ENT MD, she just needs referral put in  Patient called back this morning stating that she is having "severe pain that is really bad" in cheek & neck this morning. Per patient, [facility] has already called w/appointment information.

## 2010-11-23 NOTE — Telephone Encounter (Signed)
Received copies from [facility],on 11/23/10. Forwarded 2pages to Dr. Reva Bores review.

## 2010-11-24 ENCOUNTER — Other Ambulatory Visit: Payer: Self-pay | Admitting: Otolaryngology

## 2010-11-24 DIAGNOSIS — R220 Localized swelling, mass and lump, head: Secondary | ICD-10-CM

## 2010-11-26 ENCOUNTER — Telehealth: Payer: Self-pay | Admitting: Internal Medicine

## 2010-11-26 ENCOUNTER — Ambulatory Visit
Admission: RE | Admit: 2010-11-26 | Discharge: 2010-11-26 | Disposition: A | Discharge status: Home / Self Care | Payer: Federal, State, Local not specified - PPO | Source: Ambulatory Visit | Attending: Otolaryngology | Admitting: Otolaryngology

## 2010-11-26 DIAGNOSIS — R221 Localized swelling, mass and lump, neck: Secondary | ICD-10-CM

## 2010-11-26 DIAGNOSIS — R220 Localized swelling, mass and lump, head: Secondary | ICD-10-CM

## 2010-11-26 MED ORDER — GADOBENATE DIMEGLUMINE 529 MG/ML IV SOLN
20.0000 mL | Freq: Once | INTRAVENOUS | Status: AC | PRN
  Administered 2010-11-26: 20 mL via INTRAVENOUS

## 2010-11-26 NOTE — Telephone Encounter (Signed)
Received copies from [facility],on 11/26/10. Forwarded  2pages to Dr. Reva Bores review.

## 2010-11-30 ENCOUNTER — Telehealth: Payer: Self-pay | Admitting: *Deleted

## 2010-11-30 NOTE — Telephone Encounter (Signed)
Pt request dates for last labs done & did anything unusual show [as she is having trouble w/lymph nodes].

## 2010-12-01 ENCOUNTER — Telehealth: Payer: Self-pay | Admitting: Internal Medicine

## 2010-12-01 NOTE — Telephone Encounter (Signed)
Received copies from [facility],on 12/01/10. Forwarded 2 pages to Dr. Norins,for review.   °

## 2010-12-02 NOTE — Telephone Encounter (Signed)
Spoke with pt she states she has had no cold or URI symptoms.  She did however mention that since she has been on antibiotic that her lymph nodes are no longer sore or swollen and that she does feel better.  She questioned if she could get blood work, states that when she was seen by her gyno that her Vit D level was low and she was started on 2000 units of Vit D a day. Please Advise on all, pt aware MD is out of the office.

## 2010-12-02 NOTE — Telephone Encounter (Signed)
Per MRI - symmetric mildly enlarged lymph nodes that suggest reactive or inflammatory changes, question if recent URI. Radiology recommends further evaluation if no resolution over the next couple of weeks.

## 2010-12-02 NOTE — Telephone Encounter (Signed)
Pt states that during the time her lymph nodes were swollen the whole right side of her body was sore she states it was just the right side.

## 2010-12-13 ENCOUNTER — Telehealth: Payer: Self-pay

## 2010-12-13 DIAGNOSIS — M791 Myalgia, unspecified site: Secondary | ICD-10-CM

## 2010-12-13 NOTE — Telephone Encounter (Signed)
Called pt and lmoam for her to call me back

## 2010-12-13 NOTE — Telephone Encounter (Signed)
Patient called [facility] requesting call back from nurse.

## 2010-12-14 NOTE — Telephone Encounter (Signed)
Dr Emeline Darling referred pt to PT for ongoing pain on her right side. Pt has appointment with PT 4:45pm   Pt is requesting to have blood work done for unexplained right sided pain in her joints (shoulder, hips and finger joints) only on right side Pain in Jaw is also still present.

## 2010-12-14 NOTE — Telephone Encounter (Signed)
Ok for Sed rate, ANA, Rheum factor as first round of tests for MSK pain, r/o rheumatologic disease.

## 2010-12-15 NOTE — Telephone Encounter (Signed)
Informed pt lab orders are in she will go to the lab one day this week to have lab work done

## 2010-12-17 ENCOUNTER — Other Ambulatory Visit (INDEPENDENT_AMBULATORY_CARE_PROVIDER_SITE_OTHER): Payer: Federal, State, Local not specified - PPO

## 2010-12-17 DIAGNOSIS — IMO0001 Reserved for inherently not codable concepts without codable children: Secondary | ICD-10-CM

## 2010-12-17 DIAGNOSIS — M791 Myalgia, unspecified site: Secondary | ICD-10-CM

## 2010-12-17 LAB — SEDIMENTATION RATE: Sed Rate: 29 mm/hr — ABNORMAL HIGH (ref 0–22)

## 2010-12-18 LAB — RHEUMATOID FACTOR: Rhuematoid fact SerPl-aCnc: <10 IU/mL (ref <=14)

## 2010-12-20 ENCOUNTER — Encounter: Payer: Self-pay | Admitting: Internal Medicine

## 2010-12-20 LAB — ANA: Anti Nuclear Antibody(ANA): NEGATIVE

## 2010-12-26 ENCOUNTER — Encounter: Payer: Self-pay | Admitting: Internal Medicine

## 2011-01-06 ENCOUNTER — Ambulatory Visit (INDEPENDENT_AMBULATORY_CARE_PROVIDER_SITE_OTHER): Payer: Federal, State, Local not specified - PPO | Admitting: Internal Medicine

## 2011-01-06 DIAGNOSIS — I10 Essential (primary) hypertension: Secondary | ICD-10-CM

## 2011-01-06 MED ORDER — HYDROCHLOROTHIAZIDE 25 MG PO TABS: 25.0000 mg | ORAL_TABLET | Freq: Every day | ORAL | Status: DC

## 2011-01-06 NOTE — Patient Instructions (Signed)
Blood pressure - it is a problem, moderate as it may be. The goal is to reduce the risk of the sequelae of hypertension - stroke, heart attack, kidney failure. You have multiple risk factors: positive family history, weight, hypertension, post-menopausal. Goal is control, by whatever means.  Plan - life style management: exercise - minimum of 3 sessions a week where your heart rate is up to 120-130 for 30 minutes                                                 Low salt                                                 Weight management - smart food choices, PORTION SIZE CONTROL - use your hand as a guide                                                          Target weight 220 lbs, goal to loose 1-2 lbs per month           Medication - the most responsible recommendation is to take HCTZ daily. When you reach your target weight - a drug holiday to see if the problem is                                 Resolved.   No evidence of any neurologic event on today's exam.

## 2011-01-09 NOTE — Assessment & Plan Note (Signed)
BP Readings from Last 3 Encounters:  01/06/11 130/98  11/22/10 132/86  07/19/10 122/82   Off of medication BP has gone up. Discussed mechanism and long term duration of HTN. She really wishes to pursue non-medication management.  Plan - resume HCTZ           Weight management approach: smart food choices, PORTION SIZE control and regular exercise. Target wt = 200 lbs; goal is to loose 1-2 lbs per month           When she has attained a weight loss of 20--26 lbs will have a drug holiday to determines if she needs to continue taking medications.

## 2011-01-09 NOTE — Progress Notes (Signed)
  Subjective:    Patient ID: Alicia [name], female    DOB: [birth year], 55 y.o.   MRN: [id number]  HPI Mrs. Tapanes is seen on an acute basis due to elevated BP at work, >150/90. She had no focal neurologic symptoms. Her BP at work was 160/110. At the time of exam she is feeling well. She does report that she is not a medication person and had stopped taking HCTZ several months ago.  Past Medical History  Diagnosis Date  . Palpitations   . Varicose veins   . Near syncope   . Chronic tension headaches   . Nephrolithiasis     history of   Past Surgical History  Procedure Date  . Abdominal hysterectomy   . Cholecystectomy   . Tonsillectomy    No family history on file. History   Social History  . Marital Status: Married    Spouse Name: N/A    Number of Children: 3  . Years of Education: 16   Occupational History  . teacher [employer]   Social History Main Topics  . Smoking status: Never Smoker   . Smokeless tobacco: Never Used  . Alcohol Use: Not on file  . Drug Use: No  . Sexually Active: Yes -- Female partner(s)   Other Topics Concern  . Not on file   Social History Narrative   [name]- [employer]. Married '79. 2 daughters- '82, '93, 1 son- '87. 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 SO- good health       Review of Systems System review is negative for any constitutional, cardiac, pulmonary, GI or neuro symptoms or complaints other than as described in the HPI.     Objective:   Physical Exam Vitals reviewed - stable Gen'l- overweight white woman in no distress. HEENT- C&S clear Cor- RRR Pulm - normal respirations Neuro - A&O x 3, CN II-XII normal with normal facial symmetry, EOMI, PERRLA, Fundi grossly normal; MS 5/5: DTRs 2+ and symmetrical, cerebellar function - normal gait, no tremor.       Assessment & Plan:

## 2011-02-08 ENCOUNTER — Telehealth: Payer: Self-pay | Admitting: Internal Medicine

## 2011-02-08 ENCOUNTER — Telehealth: Payer: Self-pay

## 2011-02-08 NOTE — Telephone Encounter (Signed)
Called patient - new onset of pain right neck. Warm to touch and tender. Different from the nodules under investigation.  Will see tomorrow. msg sent to scheduling (Ms. Caswell Corwin)

## 2011-02-08 NOTE — Telephone Encounter (Signed)
The patient called to inform MD she was seen by Dr. Emeline Darling. They are going to do an [place] and biopsy on the lumps on her neck. The patient would like to know has her recent blood work been normal and do you need to follow-up with this patient? Call back number [phone] (work) and [phone]

## 2011-02-08 NOTE — Telephone Encounter (Signed)
Received copies from [facility] and [facility],on 1.22.13. Forwarded 3 pages to Dr. Debby Bud ,for review.  sj

## 2011-02-09 ENCOUNTER — Encounter: Payer: Self-pay | Admitting: Internal Medicine

## 2011-02-09 ENCOUNTER — Ambulatory Visit (INDEPENDENT_AMBULATORY_CARE_PROVIDER_SITE_OTHER): Payer: Federal, State, Local not specified - PPO | Admitting: Internal Medicine

## 2011-02-09 VITALS — BP 124/70 | HR 80 | Temp 97.9°F | Wt 264.0 lb

## 2011-02-09 DIAGNOSIS — K1120 Sialoadenitis, unspecified: Secondary | ICD-10-CM

## 2011-02-09 MED ORDER — CLINDAMYCIN HCL 150 MG PO CAPS: 450.0000 mg | ORAL_CAPSULE | Freq: Three times a day (TID) | ORAL | Status: DC

## 2011-02-09 MED ORDER — CIPROFLOXACIN HCL 500 MG PO TABS: 500.0000 mg | ORAL_TABLET | Freq: Two times a day (BID) | ORAL | Status: DC

## 2011-02-09 NOTE — Progress Notes (Signed)
  Subjective:    Patient ID: Alicia [name], female    DOB: [birth year], 56 y.o.   MRN: [id number]  HPI Alicia [name] is seen acutely for swelling, redness and severe pain in the pre and post auricular region on the right. She has not had fever with this. She has been in the process of evaluation for lymphadenopathy and was to have U/S and possible biopsy before this happened. She has not had trouble swallowing, no fever or chills.   PMH, FamHx and SocHx reviewed for any changes and relevance.   Review of Systems System review is negative for any constitutional, cardiac, pulmonary, GI or neuro symptoms or complaints other than as described in the HPI.     Objective:   Physical Exam Filed Vitals:   02/09/11 1415  BP: 124/70  Pulse: 80  Temp: 97.9 F (36.6 C)   Gen'l - overweight white woman in no distress HEENT- right face in the preauricular area is red, warm to touch and tender. No crepitis. Normal movement of the jaw PUlm - normal  Cor -RRR       Assessment & Plan:  Parototis - Plan - clindamycine 450 mg tid           cipro 500 mg bid           Both for seven days.           APAP for fever and pain.

## 2011-02-09 NOTE — Patient Instructions (Signed)
Signs and symptoms are strongly suggestive of infection involving the parotid gland (parotitis). The features missing are trismis (like mild lockjaw) and high fever. Plan is double antibiotic therapy with cipro twice a day and clindamycin 3 caps 3 times. Call for problems with medication or for persistent pain or fever.   Parotitis Parotitis is an inflammation of one or both parotid glands. This is the main salivary gland. It lies behind the angle of the jaw and below the ear lobe. The saliva produced comes out of a tiny opening (duct) inside the cheek on either side. It is usually at the level of the upper back teeth. If the parotid gland is swollen, the ear is pushed up and out in a particular way. This helps set this condition apart from a simple lymph gland infection in the same area. CAUSES   Cases of mumps have mostly disappeared since the start of immunization against mumps. Currently, the most common causes of parotitis are:  Germ (bacterial) infection.     Inflammation of the lymph channels (lymphatics).  Other Uncommon Causes of Parotitis:  Sjogren's syndrome. A condition in which arthritis is associated with a decrease in activity of the glands of the body that produce saliva and tears. Some people are bothered by a dry mouth and intermittent salivary gland enlargement. The diagnosis is made with blood tests or by examination of a piece of tissue from the inside of the lip.     Atypical mycobacteria. Can give rise to a condition that usually infects children. It is a germ similar to tuberculosis. It is often resistant to antibiotic treatment. It may require surgical treatment and removal of the infected salivary gland.     Actinomycosis. An infection of the parotid gland that may also involve the overlying skin. The diagnosis is made by detecting granules of sulphur, produced by the bacteria, on microscopic examination. Treatment is with a prolonged course of penicillin for up to one year.   Acute (Sudden Onset) Bacertial Parotitis This is a sudden inflammatory response to bacterial infection that causes:  Redness (erythema).     Pain.    Swelling.    Tenderness over the gland on the side of the cheek.     The appearance of pus from the opening of the duct on the inside of the cheek.  It used to be common in dehydrated and debilitated patients, and often following surgery. It is now more commonly seen after radiotherapy (X-ray treatment) or in patients with a poorly working immune system. Treatment includes:    Correction of the lack of fluids (rehydration).     Medications which kill germs(antibiotics).     Pain relief.  Chronic Recurrent Parotitis This refers to repeated episodes of discomfort and swelling of the parotid gland. This occurs often after eating. It is caused by decreased flow of saliva. This is often due to either blockage of the duct by a stone or the formation of a duct narrowing. It is treated with:    Gland massage.     Methods to stimulate the flow of saliva (for example, giving lemon juice).     Antibiotics if required.  Surgery to remove the gland is possible. The benefits of surgery need to be balanced against the risk of damage to the facial nerve. The facial nerve allows the muscles of facial expression to function. Damage to this can cause paralysis of one side of the face. X-ray treatment (radiotherapy) and treatment with steroid tablets have been considered.  But they are thought to be ineffective. Viral Parotitis The most common viral cause of parotitis is mumps. It usually affects 4 to 10 year olds. It causes painful swelling of both parotid glands. Recurrent Parotitis in Children This condition is thought to be due to swelling or ballooning of the ducts (ectasia). It results in the same problems(symptoms ) as acute bacterial parotitis. It is usually caused by bacteria called streptococci that is treated with penicillin. It usually gets well  by itself without treatment (self-limiting). Surgery is usually not needed. Tuberculosis The parotid glands may become infected with the same bacteria causing tuberculosis (TB). Treatment is with anti-tuberculous antibiotic therapy. HOME CARE INSTRUCTIONS    Apply ice bags about every 2 hours, for 15 to 20 minutes, while awake, to the sore gland. Place ice in a plastic bag with a towel around it to prevent frostbite to skin. Continue for 24 hours and then as directed by your caregiver.     Only take over-the-counter or prescription medicines for pain, discomfort, or fever as directed by your caregiver.  SEEK IMMEDIATE MEDICAL CARE IF:    There is increased pain or swelling in your gland that is not controlled with medication.     You have a fever.  Document Released: 06/25/2001 Document Revised: 09/15/2010 Document Reviewed: 01/03/2005 [facility] Patient Information 2012 [place], [place].

## 2011-02-14 ENCOUNTER — Telehealth: Payer: Self-pay

## 2011-02-14 NOTE — Telephone Encounter (Signed)
[facility] Triage Call Report Triage Record Num: [id number] Operator: Gypsy Decant Patient Name: Alicia [name] Call Date & Time: 02/12/2011 6:36:35AM Patient Phone: [phone] PCP: Illene Regulus Patient Gender: Female PCP Fax : [phone] Patient DOB: 07/08/1955 [facility] Name[facility] Reason for Call: Caller: Drenda/Patient; PCP: Illene Regulus; CB#: [phone]; Call Reason: Lymph Nodes Swelling in Neck and Corotid Glands; Sx Onset: 01/29/2011; Sx Notes: Seen in office on 02/09/2011 and dx with Parotitis. Patient placed on Cipro and Clindamycin. ; Afebrile; Wt: ; Home treatment(s) tried: Antibiotics; Did home treatment help?: Yes; Guideline Used: Rash ; Disp: Urgent s/s of New onset of rash after beginning new prescribed, medication identified, Home care advice given; Appt Scheduled?: Not available. Protocol(s) Used: Rash Recommended Outcome per Protocol: Call Provider within 4 Hours Reason for Outcome: New onset of rash after beginning new prescribed, nonprescribed, or alternative/complementary medication Care Advice: Cool/tepid showers or baths may help relieve itching. If cool water alone does not relieve itching, try adding 1/2 to 1 cup baking soda or colloidal oatmeal (Aveeno) to bath water. ~ Avoid use of perfumed or strong soaps, detergents or any product that is irritating to the skin. Only use mild, unscented soap (Aveeno, Neutrogena) for bathing to decrease irritation. ~ ~ Rash from a drug reaction may not appear for a week or more after a drug was started. Call provider if rash gets worse after offending drug has been discontinued, or if it continues for more than a week with no improvement. ~ ~ Speak with provider before next dose of medication is due. ~ SYMPTOM / CONDITION MANAGEMENT ~ Should not be alone for the next 24 hours in case symptoms worsen. ~ IMMEDIATE ACTION ~ CAUTIONS ~ List, or take, all current prescription(s), nonprescription or  alternative medication(s) to provider for evaluation. If an allergy is identified, tell all healthcare providers of your allergy. Even if a first-time reaction caused mild symptoms, a future response to the same allergen may cause more serious symptoms. Wear medical identification to alert others in case of an emergency. ~ Call EMS 911 if develop signs and symptoms of anaphylaxis within minutes to several hours of exposure: severe difficulty breathing; rapid, weak or irregular pulse; pruritus, urticaria, swelling of face, lips, tongue, or throat causing tightness or difficulty swallowing; abdominal cramping, nausea, vomiting or diarrhea. ~ If immediately available, consider taking an appropriate dose of a nonprescription antihistamine (e.g., Benadryl) orally as directed by the label or a pharmacist. These medications may cause drowsiness and should be taken with caution by adults 75 years or older. ~ 02/12/2011 6:44:52AM Page 1 of 1 [email or web address]

## 2011-02-14 NOTE — Telephone Encounter (Signed)
LMOM to inform patient; also, if needed we will see as early as possible tomorrow morning 01.29.12/SLS

## 2011-02-14 NOTE — Telephone Encounter (Signed)
Pt called again this morning to advised MEN that she is upset that no one was able to see her and she was told to go to Urgent Care on Saturday. Pt still unsure of what medication to stop/start, she is no longer taking the Cipro and was advised by her ENT doctor to take Benadryl.

## 2011-02-14 NOTE — Telephone Encounter (Signed)
We regret that our Saturday clinic was cancelled. If there is still a rash after stopping cipro we can see briefly today.

## 2011-02-14 NOTE — Telephone Encounter (Signed)
[facility] Triage Call Report Triage Record Num: [id number] Operator: Tarri Glenn Patient Name: Alicia [name] Call Date & Time: 02/11/2011 6:47:51PM Patient Phone: [phone] PCP: Illene Regulus Patient Gender: Female PCP Fax : [phone] Patient DOB: 03-02-1955 [facility] Name[facility] Reason for Call: Caller: Ossie/Patient; PCP: Illene Regulus; CB#: [phone]; Call Reason: Rash/Hives; Sx Onset: 02/11/2011; Sx Notes: ; Afebrile; Wt: ; Guideline Used: ; Disp:; Appt Scheduled?: Patient is calling about was seen in office and diagnosed with parotitis. Patient was put on Cipro and Clindamycin, started on 02/09/11. Patient has red, raised itchy rash along jaw line and neck is swollen. All emergent s/s r/o with exception to signs/symptoms of anaphylaxis per Allergic Reaction, Severe protocol. Advised patient to call 911. Protocol(s) Used: Allergic Reaction, Severe Recommended Outcome per Protocol: Activate EMS 911 Reason for Outcome: Signs/symptoms of anaphylaxis Care Advice: ~ Do not give the patient anything to eat or drink. ~ An adult should stay with the patient, preferably one trained in CPR. Write down provider's name. List or place the following in a bag for transport with the patient: current prescription and/or nonprescription medications; alternative treatments, therapies and medications; and street drugs. ~ 02/11/2011 6:53:13PM Page 1 of 1 [email or web address]_[email or web address]

## 2011-02-14 NOTE — Telephone Encounter (Signed)
[facility] Triage Call Report Triage Record Num: [id number] Operator: Ether Griffins Patient Name: Alicia [name] Call Date & Time: 02/12/2011 1:01:49PM Patient Phone: [phone] PCP: Illene Regulus Patient Gender: Female PCP Fax : [phone] Patient DOB: 06-Aug-1955 [facility] Name[facility] Reason for Call: Caller: [name]/Patient; PCP: Illene Regulus; CB#: [phone]; Call Reason: Had reaction to antibx--red bumpy rash around jawline,neck and cheeks swollen, was itching all over.Onset 02/11/11.Taking Clindamycin & Cipro for Parotitis--started 02/09/11.Spoke to nurse last night and was told to stop meds.Wants new antibx ordered.Contacted Dr.Todd--he advised to go to [facility] to be reevaluated. Protocol(s) Used: Office Note Recommended Outcome per Protocol: Information Noted and Sent to Office Reason for Outcome: Caller information to office Care Advice: ~ 02/12/2011 1:18:06PM Page 1 of 1 CAN_TriageRpt_V2

## 2011-02-14 NOTE — Telephone Encounter (Signed)
LMOM for call back from patient to obtain more information on status/SLS 01.28.13

## 2011-02-14 NOTE — Telephone Encounter (Signed)
Pt called stating that while taking both ABX Rx's for parotitis she has developed a rash on her jaw line on either side of her face. She  describes it as small bumps that are red and itchy. Pt is concerned that this is a SE of one of the ABX, please advise.

## 2011-02-15 ENCOUNTER — Emergency Department (INDEPENDENT_AMBULATORY_CARE_PROVIDER_SITE_OTHER)
Admission: EM | Admit: 2011-02-15 | Discharge: 2011-02-15 | Disposition: A | Discharge status: Home Health | Payer: Federal, State, Local not specified - PPO | Source: Home / Self Care | Attending: Emergency Medicine | Admitting: Emergency Medicine

## 2011-02-15 ENCOUNTER — Encounter (HOSPITAL_COMMUNITY): Payer: Self-pay | Admitting: *Deleted

## 2011-02-15 ENCOUNTER — Other Ambulatory Visit (HOSPITAL_COMMUNITY): Payer: Self-pay | Admitting: Otolaryngology

## 2011-02-15 ENCOUNTER — Encounter: Payer: Federal, State, Local not specified - PPO | Admitting: Endocrinology

## 2011-02-15 DIAGNOSIS — J069 Acute upper respiratory infection, unspecified: Secondary | ICD-10-CM

## 2011-02-15 DIAGNOSIS — IMO0002 Reserved for concepts with insufficient information to code with codable children: Secondary | ICD-10-CM

## 2011-02-15 DIAGNOSIS — Z0289 Encounter for other administrative examinations: Secondary | ICD-10-CM

## 2011-02-15 DIAGNOSIS — R220 Localized swelling, mass and lump, head: Secondary | ICD-10-CM

## 2011-02-15 LAB — DIFFERENTIAL
Basophils Absolute: 0 10*3/uL (ref 0.0–0.1)
Basophils Relative: 0 % (ref 0–1)
Eosinophils Absolute: 0.4 10*3/uL (ref 0.0–0.7)
Eosinophils Relative: 5 % (ref 0–5)
Lymphocytes Relative: 20 % (ref 12–46)
Lymphs Abs: 1.4 10*3/uL (ref 0.7–4.0)
Monocytes Absolute: 0.8 10*3/uL (ref 0.1–1.0)
Monocytes Relative: 11 % (ref 3–12)
Neutro Abs: 4.5 10*3/uL (ref 1.7–7.7)
Neutrophils Relative %: 63 % (ref 43–77)

## 2011-02-15 LAB — CBC
HCT: 40 % (ref 36.0–46.0)
Hemoglobin: 13.9 g/dL (ref 12.0–15.0)
MCH: 31 pg (ref 26.0–34.0)
MCHC: 34.8 g/dL (ref 30.0–36.0)
MCV: 89.3 fL (ref 78.0–100.0)
Platelets: 212 10*3/uL (ref 150–400)
RBC: 4.48 MIL/uL (ref 3.87–5.11)
RDW: 11.9 % (ref 11.5–15.5)
WBC: 7.2 10*3/uL (ref 4.0–10.5)

## 2011-02-15 NOTE — ED Notes (Signed)
PT  BEING  TREATED  FOR  PAROTID  GLAND  SWELLING  BY  DR  Arthur Holms   SHE  IS  ON  CLINDAMYCIN   SHE  WAS  ON  CIPRO  UNTIL  4  DAYS  AGO  WHEN  SHE  STOPPED  THAT  DUE  TO POSS REACTION    -  SHE  STATES  SHE  IS  SCHEDULED  FOR  NECK SONOGRAM   IN NEAR  FUTURE  BUT  SHE  IS  CONCERNED  ABOUT  WEAKNESS     SHE  IS  REQUESTING A  BLOOD TEST

## 2011-02-15 NOTE — ED Provider Notes (Signed)
Chief Complaint  Patient presents with  . Facial Swelling    History of Present Illness:  The patient is a 56 year old female who has had a series of respiratory infections since November of last year. This started out with soreness in her right jaw, then she felt some nodules in her neck. She saw an oral surgeon who did a CT scan and an MRI scan. She's not sure exactly what they showed. She was given clindamycin and felt somewhat better. In early January she had URI symptoms and had 2 more nodules in her neck. She was referred to an ENT doctor, Dr. Emeline Darling who did an ultrasound and plans to do a biopsy. This past Wednesday both her parotid glands swelled up. She saw her primary care physician Dr. Debby Bud who started her on Cipro and clindamycin. This past Friday, 5 days ago she developed a rash on her face and neck and was told to stop the Cipro but continue the clindamycin. The rash has gone away and the swelling of the parotids has gone down. Today however she felt dizzy, achy, had a right-sided headache, rhinorrhea, sore throat, and a slight cough. She denies any fever. She is requesting a blood test to check on her immune status.  Review of Systems:  Other than noted above, the patient denies any of the following symptoms. Systemic:  No fever, chills, sweats, fatigue, myalgias, headache, or anorexia. Eye:  No redness, pain or drainage. ENT:  No earache, nasal congestion, rhinorrhea, sinus pressure, or sore throat. Lungs:  No cough, sputum production, wheezing, shortness of breath. Or chest pain. GI:  No nausea, vomiting, abdominal pain or diarrhea. Skin:  No rash or itching.  PMFSH:  Past medical history, family history, social history, meds, and allergies were reviewed.  Physical Exam:   Vital signs:  BP 146/89  Pulse 76  Temp(Src) 98.2 F (36.8 C) (Oral)  Resp 20  SpO2 96% General:  Alert, in no distress. Eye:  No conjunctival injection or drainage. ENT:  TMs and canals were normal,  without erythema or inflammation.  Nasal mucosa was clear and uncongested, without drainage.  Mucous membranes were moist.  Pharynx was clear, without exudate or drainage.  There were no oral ulcerations or lesions. Neck:  Supple, no adenopathy, tenderness or mass. Lungs:  No respiratory distress.  Lungs were clear to auscultation, without wheezes, rales or rhonchi.  Breath sounds were clear and equal bilaterally. Heart:  Regular rhythm, without gallops, murmers or rubs. Skin:  Clear, warm, and dry, without rash or lesions.  Labs:   Results for orders placed during the hospital encounter of 02/15/11  CBC      Component Value Range   WBC 7.2  4.0 - 10.5 (K/uL)   RBC 4.48  3.87 - 5.11 (MIL/uL)   Hemoglobin 13.9  12.0 - 15.0 (g/dL)   HCT 16.1  09.6 - 04.5 (%)   MCV 89.3  78.0 - 100.0 (fL)   MCH 31.0  26.0 - 34.0 (pg)   MCHC 34.8  30.0 - 36.0 (g/dL)   RDW 40.9  81.1 - 91.4 (%)   Platelets 212  150 - 400 (K/uL)  DIFFERENTIAL      Component Value Range   Neutrophils Relative 63  43 - 77 (%)   Neutro Abs 4.5  1.7 - 7.7 (K/uL)   Lymphocytes Relative 20  12 - 46 (%)   Lymphs Abs 1.4  0.7 - 4.0 (K/uL)   Monocytes Relative 11  3 - 12 (%)  Monocytes Absolute 0.8  0.1 - 1.0 (K/uL)   Eosinophils Relative 5  0 - 5 (%)   Eosinophils Absolute 0.4  0.0 - 0.7 (K/uL)   Basophils Relative 0  0 - 1 (%)   Basophils Absolute 0.0  0.0 - 0.1 (K/uL)    Assessment:   Diagnoses that have been ruled out:  None  Diagnoses that are still under consideration:  None  Final diagnoses:  Upper respiratory infection - I think she has had an unfortunate series of respiratory infections. Which she has now appears to be just a viral upper respiratory infection. I reassured her that I did not find any concerning symptoms, suggested she followup with Dr. Emeline Darling and with Dr. Debby Bud, and gave her suggestions of things she could do to improve her immune function.       Plan:   1.  The following meds were prescribed:     New Prescriptions   No medications on file   2.  The patient was instructed in symptomatic care and handouts were given. 3.  The patient was told to return if becoming worse in any way, if no better in 3 or 4 days, and given some red flag symptoms that would indicate earlier return.   Roque Lias, MD 02/15/11 [phone]

## 2011-02-15 NOTE — Telephone Encounter (Signed)
LMOM [2nd] reiterating message from 01.28.12.

## 2011-02-16 ENCOUNTER — Encounter (HOSPITAL_COMMUNITY): Payer: Self-pay | Admitting: Pharmacy Technician

## 2011-02-18 ENCOUNTER — Other Ambulatory Visit: Payer: Self-pay | Admitting: Radiology

## 2011-02-23 ENCOUNTER — Other Ambulatory Visit: Payer: Self-pay | Admitting: Radiology

## 2011-02-24 ENCOUNTER — Ambulatory Visit (HOSPITAL_COMMUNITY)
Admission: RE | Admit: 2011-02-24 | Discharge: 2011-02-24 | Disposition: A | Discharge status: Home / Self Care | Payer: Federal, State, Local not specified - PPO | Source: Ambulatory Visit | Attending: Otolaryngology | Admitting: Otolaryngology

## 2011-02-24 ENCOUNTER — Other Ambulatory Visit (HOSPITAL_COMMUNITY): Payer: Self-pay | Admitting: Otolaryngology

## 2011-02-24 DIAGNOSIS — R220 Localized swelling, mass and lump, head: Secondary | ICD-10-CM

## 2011-02-24 DIAGNOSIS — R221 Localized swelling, mass and lump, neck: Secondary | ICD-10-CM

## 2011-03-01 ENCOUNTER — Telehealth: Payer: Self-pay | Admitting: Internal Medicine

## 2011-03-01 NOTE — Telephone Encounter (Signed)
Received copies from [facility] and Thro[facility],on 03/01/11. Forwarded 5pages to Dr. Reva Bores review.

## 2011-04-04 NOTE — Progress Notes (Signed)
This encounter was created in error - please disregard.

## 2011-06-06 ENCOUNTER — Encounter (HOSPITAL_COMMUNITY): Payer: Self-pay | Admitting: Emergency Medicine

## 2011-06-06 ENCOUNTER — Telehealth: Payer: Self-pay | Admitting: *Deleted

## 2011-06-06 ENCOUNTER — Emergency Department (HOSPITAL_COMMUNITY)
Admission: EM | Admit: 2011-06-06 | Discharge: 2011-06-06 | Disposition: A | Discharge status: Home / Self Care | Payer: Federal, State, Local not specified - PPO | Source: Home / Self Care | Attending: Family Medicine | Admitting: Family Medicine

## 2011-06-06 DIAGNOSIS — B029 Zoster without complications: Secondary | ICD-10-CM

## 2011-06-06 HISTORY — DX: Essential (primary) hypertension: I10

## 2011-06-06 MED ORDER — VALACYCLOVIR HCL 1 G PO TABS: 1000.0000 mg | ORAL_TABLET | Freq: Three times a day (TID) | ORAL | Status: AC

## 2011-06-06 NOTE — ED Notes (Signed)
C/o of rash along rt side of jaw with rt earache since yesterday

## 2011-06-06 NOTE — ED Provider Notes (Signed)
History     CSN: [id number]  Arrival date & time 06/06/11  0803   First MD Initiated Contact with Patient 06/06/11 [phone]      Chief Complaint  Patient presents with  . Rash    (Consider location/radiation/quality/duration/timing/severity/associated sxs/prior treatment) HPI Comments: Onset yesterday with prickly pain right jaw and ear . Rash developed yesterday. She also has a fever blister on her lower lip. No rash on noes or around eye. States she feels achy as well. No fever. No runny nose or cold symptoms.   The history is provided by the patient.    Past Medical History  Diagnosis Date  . Palpitations   . Varicose veins   . Near syncope   . Chronic tension headaches   . Nephrolithiasis     history of  . Hypertension     Past Surgical History  Procedure Date  . Abdominal hysterectomy   . Cholecystectomy   . Tonsillectomy     History reviewed. No pertinent family history.  History  Substance Use Topics  . Smoking status: Never Smoker   . Smokeless tobacco: Never Used  . Alcohol Use: Not on file    OB History    Grav Para Term Preterm Abortions TAB SAB Ect Mult Living                  Review of Systems  Constitutional: Negative.   HENT: Positive for ear pain.   Respiratory: Negative.   Cardiovascular: Negative.   Gastrointestinal: Negative.   Genitourinary: Negative.   Musculoskeletal: Positive for arthralgias.  Skin: Positive for rash.  Neurological: Positive for headaches.    Allergies  Contrast media; Iohexol; Morphine; Penicillins; Sulfamethoxazole w-trimethoprim; Sulfonamide derivatives; and Ciprofloxacin  Home Medications   Current Outpatient Rx  Name Route Sig Dispense Refill  . ADVIL ALLERGY SINUS PO Oral Take 1 tablet by mouth every 8 (eight) hours as needed. For cold symptoms    . HYDROCHLOROTHIAZIDE 25 MG PO TABS Oral Take 1 tablet (25 mg total) by mouth daily. 90 tablet 3  . IBUPROFEN 200 MG PO TABS Oral Take 400 mg by mouth every  6 (six) hours as needed. For pain    . VALACYCLOVIR HCL 1 G PO TABS Oral Take 1 tablet (1,000 mg total) by mouth 3 (three) times daily. 21 tablet 0    BP 144/82  Pulse 59  Temp(Src) 98.3 F (36.8 C) (Oral)  Resp 18  SpO2 98%  Physical Exam  Nursing note and vitals reviewed. Constitutional: She appears well-developed and well-nourished. No distress.  HENT:  Head: Atraumatic.       Red raised rash right mandible. No vesicles yet. Ears clear, scalp clear.   Neck: Normal range of motion. Neck supple.  Cardiovascular: Normal rate and regular rhythm.   Pulmonary/Chest: Effort normal and breath sounds normal.  Lymphadenopathy:    She has cervical adenopathy.  Skin: Skin is warm and dry.    ED Course  Procedures (including critical care time)  Labs Reviewed - No data to display No results found.   1. Shingles       MDM          Randa Spike, MD 06/06/11 [phone]

## 2011-06-06 NOTE — Telephone Encounter (Signed)
Patient called to let Dr. Debby Bud know she  Was diagnosed with Shingles at Urgent Care this AM. Rash was on her jaw line and to her ear. Rx given Valacyclovir one gram 3 x day  X 7 days. Patient to call if symptoms worsen or need to be worked in to see a provider this week.

## 2011-06-06 NOTE — Discharge Instructions (Signed)
Increase your ibuprofen to 4 tabs(800mg ) three times daily with food. Avoid pregnant females. Shingles Shingles is caused by the same virus that causes chickenpox (varicella zoster virus or VZV). Shingles often occurs many years or decades after having chickenpox. That is why it is more common in adults older than 50 years. The virus reactivates and breaks out as an infection in a nerve root. SYMPTOMS   The initial feeling (sensations) may be pain. This pain is usually described as:   Burning.   Stabbing.   Throbbing.   Tingling in the nerve root.   A red rash will follow in a couple days. The rash may occur in any area of the body and is usually on one side (unilateral) of the body in a band or belt-like pattern. The rash usually starts out as very small blisters (vesicles). They will dry up after 7 to 10 days. This is not usually a significant problem except for the pain it causes.   Long-lasting (chronic) pain is more likely in an elderly person. It can last months to years. This condition is called postherpetic neuralgia.  Shingles can be an extremely severe infection in someone with AIDS, a weakened immune system, or with forms of leukemia. It can also be severe if you are taking transplant medicines or other medicines that weaken the immune system. TREATMENT  Your caregiver will often treat you with:  Antiviral drugs.   Anti-inflammatory drugs.   Pain medicines.  Bed rest is very important in preventing the pain associated with herpes zoster (postherpetic neuralgia). Application of heat in the form of a hot water bottle or electric heating pad or gentle pressure with the hand is recommended to help with the pain or discomfort. PREVENTION  A varicella zoster vaccine is available to help protect against the virus. The Food and Drug Administration approved the varicella zoster vaccine for individuals 12 years of age and older. HOME CARE INSTRUCTIONS   Cool compresses to the area of  rash may be helpful.   Only take over-the-counter or prescription medicines for pain, discomfort, or fever as directed by your caregiver.   Avoid contact with:   Babies.   Pregnant women.   Children with eczema.   Elderly people with transplants.   People with chronic illnesses, such as leukemia and AIDS.   If the area involved is on your face, you may receive a referral for follow-up to a specialist. It is very important to keep all follow-up appointments. This will help avoid eye complications, chronic pain, or disability.  SEEK IMMEDIATE MEDICAL CARE IF:   You develop any pain (headache) in the area of the face or eye. This must be followed carefully by your caregiver or ophthalmologist. An infection in part of your eye (cornea) can be very serious. It could lead to blindness.   You do not have pain relief from prescribed medicines.   Your redness or swelling spreads.   The area involved becomes very swollen and painful.   You have a fever.   You notice any red or painful lines extending away from the affected area toward your heart (lymphangitis).   Your condition is worsening or has changed.  Document Released: 01/03/2005 Document Revised: 12/23/2010 Document Reviewed: 12/08/2008 [facility] Patient Information 2012 [place], [place].

## 2011-06-07 ENCOUNTER — Telehealth: Payer: Self-pay | Admitting: Internal Medicine

## 2011-06-07 NOTE — Telephone Encounter (Signed)
Caller: Alicia [name]/Patient; PCP: Illene Regulus; CB#: [phone]; Call regarding Pain in [place]; L hand pain onset 1000 06/07/11.  No injury noted.  States it felt "tingly" in L 5th digit, with some achiness/pain noted to the finger.  Pain is located mostly at the proximal joint to the hand.  Had a rash on R jawline, and up to ear, and diagnosed with Ramsey-Hunt.  States has had some pain in upper arm x 1 month.  Per protocol, emergent symptoms denied; advised appt within 24 hours.  Appt sched 06/08/11 1115 with Dr. Yetta Barre.

## 2011-06-08 ENCOUNTER — Encounter: Payer: Self-pay | Admitting: Internal Medicine

## 2011-06-08 ENCOUNTER — Ambulatory Visit (INDEPENDENT_AMBULATORY_CARE_PROVIDER_SITE_OTHER)
Admission: RE | Admit: 2011-06-08 | Discharge: 2011-06-08 | Disposition: A | Discharge status: Home / Self Care | Payer: Federal, State, Local not specified - PPO | Source: Ambulatory Visit | Attending: Internal Medicine | Admitting: Internal Medicine

## 2011-06-08 ENCOUNTER — Other Ambulatory Visit (INDEPENDENT_AMBULATORY_CARE_PROVIDER_SITE_OTHER): Payer: Federal, State, Local not specified - PPO

## 2011-06-08 ENCOUNTER — Ambulatory Visit (INDEPENDENT_AMBULATORY_CARE_PROVIDER_SITE_OTHER): Payer: Federal, State, Local not specified - PPO | Admitting: Internal Medicine

## 2011-06-08 VITALS — BP 138/84 | HR 63 | Temp 98.1°F | Resp 16 | Wt 262.0 lb

## 2011-06-08 DIAGNOSIS — IMO0001 Reserved for inherently not codable concepts without codable children: Secondary | ICD-10-CM

## 2011-06-08 DIAGNOSIS — R202 Paresthesia of skin: Secondary | ICD-10-CM

## 2011-06-08 DIAGNOSIS — R05 Cough: Secondary | ICD-10-CM

## 2011-06-08 DIAGNOSIS — R209 Unspecified disturbances of skin sensation: Secondary | ICD-10-CM

## 2011-06-08 DIAGNOSIS — I10 Essential (primary) hypertension: Secondary | ICD-10-CM

## 2011-06-08 DIAGNOSIS — J45901 Unspecified asthma with (acute) exacerbation: Secondary | ICD-10-CM

## 2011-06-08 DIAGNOSIS — R059 Cough, unspecified: Secondary | ICD-10-CM

## 2011-06-08 DIAGNOSIS — J309 Allergic rhinitis, unspecified: Secondary | ICD-10-CM | POA: Insufficient documentation

## 2011-06-08 LAB — VITAMIN B12: Vitamin B-12: 461 pg/mL (ref 211–911)

## 2011-06-08 LAB — CBC WITH DIFFERENTIAL/PLATELET
Basophils Absolute: 0 10*3/uL (ref 0.0–0.1)
Basophils Relative: 0.5 % (ref 0.0–3.0)
Eosinophils Absolute: 0.3 10*3/uL (ref 0.0–0.7)
Eosinophils Relative: 4.8 % (ref 0.0–5.0)
HCT: 41.5 % (ref 36.0–46.0)
Hemoglobin: 13.7 g/dL (ref 12.0–15.0)
Lymphocytes Relative: 26.6 % (ref 12.0–46.0)
Lymphs Abs: 1.5 10*3/uL (ref 0.7–4.0)
MCHC: 33 g/dL (ref 30.0–36.0)
MCV: 93.8 fl (ref 78.0–100.0)
Monocytes Absolute: 0.6 10*3/uL (ref 0.1–1.0)
Monocytes Relative: 10.3 % (ref 3.0–12.0)
Neutro Abs: 3.3 10*3/uL (ref 1.4–7.7)
Neutrophils Relative %: 57.8 % (ref 43.0–77.0)
Platelets: 206 10*3/uL (ref 150.0–400.0)
RBC: 4.43 Mil/uL (ref 3.87–5.11)
RDW: 12.5 % (ref 11.5–14.6)
WBC: 5.6 10*3/uL (ref 4.5–10.5)

## 2011-06-08 LAB — CK: Total CK: 79 U/L (ref 7–177)

## 2011-06-08 LAB — COMPREHENSIVE METABOLIC PANEL
ALT: 32 U/L (ref 0–35)
AST: 28 U/L (ref 0–37)
Albumin: 3.9 g/dL (ref 3.5–5.2)
Alkaline Phosphatase: 65 U/L (ref 39–117)
BUN: 16 mg/dL (ref 6–23)
CO2: 30 mEq/L (ref 19–32)
Calcium: 9.2 mg/dL (ref 8.4–10.5)
Chloride: 109 mEq/L (ref 96–112)
Creatinine, Ser: 0.6 mg/dL (ref 0.4–1.2)
GFR: 104.19 mL/min (ref >60.00)
Glucose, Bld: 90 mg/dL (ref 70–99)
Potassium: 4.7 mEq/L (ref 3.5–5.1)
Sodium: 145 mEq/L (ref 135–145)
Total Bilirubin: 1 mg/dL (ref 0.3–1.2)
Total Protein: 7.3 g/dL (ref 6.0–8.3)

## 2011-06-08 LAB — TSH: TSH: 2.24 u[IU]/mL (ref 0.35–5.50)

## 2011-06-08 LAB — SEDIMENTATION RATE: Sed Rate: 30 mm/hr — ABNORMAL HIGH (ref 0–22)

## 2011-06-08 MED ORDER — METHYLPREDNISOLONE ACETATE 80 MG/ML IJ SUSP
120.0000 mg | Freq: Once | INTRAMUSCULAR | Status: AC
  Administered 2011-06-08: 120 mg via INTRAMUSCULAR

## 2011-06-08 MED ORDER — MOMETASONE FURO-FORMOTEROL FUM 100-5 MCG/ACT IN AERO: 2.0000 | INHALATION_SPRAY | Freq: Two times a day (BID) | RESPIRATORY_TRACT | Status: DC

## 2011-06-08 NOTE — Assessment & Plan Note (Signed)
Her BP is well controlled, I will check her lytes and renal function 

## 2011-06-08 NOTE — Progress Notes (Signed)
Subjective:    Patient ID: Alicia [name], female    DOB: 05-08-1955, 56 y.o.   MRN: [id number]  Cough This is a recurrent problem. The current episode started more than 1 month ago. The problem has been gradually worsening. The problem occurs every few hours. The cough is productive of sputum (clear phlegm). Associated symptoms include myalgias, nasal congestion, postnasal drip, rhinorrhea, shortness of breath, sweats and wheezing. Pertinent negatives include no chest pain, chills, ear congestion, ear pain, fever, headaches, heartburn, hemoptysis, rash, sore throat or weight loss. The symptoms are aggravated by cold air. She has tried nothing for the symptoms. Her past medical history is significant for environmental allergies.  Hypertension This is a chronic problem. The current episode started more than 1 year ago. The problem has been gradually improving since onset. The problem is controlled. Associated symptoms include shortness of breath and sweats. Pertinent negatives include no anxiety, blurred vision, chest pain, headaches, malaise/fatigue, neck pain, orthopnea, palpitations, peripheral edema or PND. Past treatments include diuretics. Compliance problems include exercise and diet.       Review of Systems  Constitutional: Positive for fatigue. Negative for fever, chills, weight loss, malaise/fatigue, diaphoresis, activity change, appetite change and unexpected weight change.  HENT: Positive for congestion, rhinorrhea and postnasal drip. Negative for hearing loss, ear pain, nosebleeds, sore throat, facial swelling, sneezing, drooling, mouth sores, trouble swallowing, neck pain, neck stiffness, dental problem, voice change, sinus pressure, tinnitus and ear discharge.   Eyes: Negative.  Negative for blurred vision.  Respiratory: Positive for cough, shortness of breath and wheezing. Negative for apnea, hemoptysis, choking, chest tightness and stridor.   Cardiovascular: Negative for chest  pain, palpitations, orthopnea, leg swelling and PND.  Gastrointestinal: Negative for heartburn, nausea, vomiting, abdominal pain, diarrhea, constipation, blood in stool, abdominal distention, anal bleeding and rectal pain.  Genitourinary: Negative.   Musculoskeletal: Positive for myalgias. Negative for back pain, joint swelling, arthralgias and gait problem.  Skin: Negative for color change, pallor, rash and wound.  Neurological: Positive for weakness ("she feels weak all over") and numbness (in her left 5th finger for 3 days). Negative for dizziness, tremors, seizures, syncope, facial asymmetry, speech difficulty, light-headedness and headaches.  Hematological: Positive for environmental allergies. Negative for adenopathy. Does not bruise/bleed easily.  Psychiatric/Behavioral: Negative.        Objective:   Physical Exam  Vitals reviewed. Constitutional: She is oriented to person, place, and time. She appears well-developed and well-nourished. No distress.  HENT:  Head: Atraumatic. No trismus in the jaw.  Right Ear: Hearing, tympanic membrane, external ear and ear canal normal.  Left Ear: Hearing, tympanic membrane, external ear and ear canal normal.  Nose: Mucosal edema and rhinorrhea present. No nose lacerations, sinus tenderness, nasal deformity, septal deviation or nasal septal hematoma. No epistaxis.  No foreign bodies. Right sinus exhibits no maxillary sinus tenderness and no frontal sinus tenderness. Left sinus exhibits no maxillary sinus tenderness and no frontal sinus tenderness.  Mouth/Throat: Oropharynx is clear and moist and mucous membranes are normal. Mucous membranes are not pale, not dry and not cyanotic. No oropharyngeal exudate, posterior oropharyngeal edema, posterior oropharyngeal erythema or tonsillar abscesses.  Eyes: Conjunctivae are normal. Right eye exhibits no discharge. Left eye exhibits no discharge. No scleral icterus.  Neck: Normal range of motion. Neck supple. No  JVD present. No tracheal deviation present. No thyromegaly present.  Cardiovascular: Normal rate, regular rhythm, normal heart sounds and intact distal pulses.  Exam reveals no gallop and no friction rub.  No murmur heard. Pulmonary/Chest: Effort normal and breath sounds normal. No stridor. No respiratory distress. She has no wheezes. She has no rales. She exhibits no tenderness.  Abdominal: Soft. Bowel sounds are normal. She exhibits no distension and no mass. There is no tenderness. There is no rebound and no guarding.  Musculoskeletal: Normal range of motion. She exhibits no edema and no tenderness.  Lymphadenopathy:    She has no cervical adenopathy.  Neurological: She is alert and oriented to person, place, and time. She has normal strength. She displays no atrophy, no tremor and normal reflexes. No cranial nerve deficit or sensory deficit. She exhibits normal muscle tone. She displays a negative Romberg sign. She displays no seizure activity. Coordination and gait normal. She displays no Babinski's sign on the right side. She displays no Babinski's sign on the left side.  Reflex Scores:      Tricep reflexes are 1+ on the right side and 1+ on the left side.      Bicep reflexes are 1+ on the right side and 1+ on the left side.      Brachioradialis reflexes are 1+ on the right side and 1+ on the left side.      Patellar reflexes are 1+ on the right side and 1+ on the left side.      Achilles reflexes are 1+ on the right side and 1+ on the left side. Skin: Skin is warm and dry. No rash noted. She is not diaphoretic. No erythema. No pallor.  Psychiatric: She has a normal mood and affect. Her behavior is normal. Judgment and thought content normal.      Lab Results  Component Value Date   WBC 7.2 02/15/2011   HGB 13.9 02/15/2011   HCT 40.0 02/15/2011   PLT 212 02/15/2011   GLUCOSE 76 05/03/2010   CHOL 181 09/29/2009   TRIG 66.0 09/29/2009   HDL 56.00 09/29/2009   LDLCALC 112* 09/29/2009   ALT  35 05/03/2010   AST 29 05/03/2010   NA 142 05/03/2010   K 4.2 05/03/2010   CL 104 05/03/2010   CREATININE 0.6 05/03/2010   BUN 17 05/03/2010   CO2 31 05/03/2010   TSH 1.77 09/29/2009   INR 1.0 ratio 04/03/2009      Assessment & Plan:

## 2011-06-08 NOTE — Assessment & Plan Note (Signed)
Depo-medrol IM injection today

## 2011-06-08 NOTE — Patient Instructions (Signed)
Asthma, Adult Asthma is caused by narrowing of the air passages in the lungs. It may be triggered by pollen, dust, animal dander, molds, some foods, respiratory infections, exposure to smoke, exercise, emotional stress or other allergens (things that cause allergic reactions or allergies). Repeat attacks are common. HOME CARE INSTRUCTIONS   Use prescription medications as ordered by your caregiver.   Avoid pollen, dust, animal dander, molds, smoke and other things that cause attacks at home and at work.   You may have fewer attacks if you decrease dust in your home. Electrostatic air cleaners may help.   It may help to replace your pillows or mattress with materials less likely to cause allergies.   Talk to your caregiver about an action plan for managing asthma attacks at home, including, the use of a peak flow meter which measures the severity of your asthma attack. An action plan can help minimize or stop the attack without having to seek medical care.   If you are not on a fluid restriction, drink 8 to 10 glasses of water each day.   Always have a plan prepared for seeking medical attention, including, calling your physician, accessing local emergency care, and calling 911 (in the [place] for a severe attack.   Discuss possible exercise routines with your caregiver.   If animal dander is the cause of asthma, you may need to get rid of pets.  SEEK MEDICAL CARE IF:   You have wheezing and shortness of breath even if taking medicine to prevent attacks.   You have muscle aches, chest pain or thickening of sputum.   Your sputum changes from clear or white to yellow, green, gray, or bloody.   You have any problems that may be related to the medicine you are taking (such as a rash, itching, swelling or trouble breathing).  SEEK IMMEDIATE MEDICAL CARE IF:   Your usual medicines do not stop your wheezing or there is increased coughing and/or shortness of breath.   You have increased  difficulty breathing.   You have a fever.  MAKE SURE YOU:   Understand these instructions.   Will watch your condition.   Will get help right away if you are not doing well or get worse.  Document Released: 01/03/2005 Document Revised: 12/23/2010 Document Reviewed: 08/22/2007 [facility] Patient Information 2012 [place], [place].Cough, Adult  A cough is a reflex that helps clear your throat and airways. It can help heal the body or may be a reaction to an irritated airway. A cough may only last 2 or 3 weeks (acute) or may last more than 8 weeks (chronic).  CAUSES Acute cough:  Viral or bacterial infections.  Chronic cough:  Infections.   Allergies.   Asthma.   Post-nasal drip.   Smoking.   Heartburn or acid reflux.   Some medicines.   Chronic lung problems (COPD).   Cancer.  SYMPTOMS   Cough.   Fever.   Chest pain.   Increased breathing rate.   High-pitched whistling sound when breathing (wheezing).   Colored mucus that you cough up (sputum).  TREATMENT   A bacterial cough may be treated with antibiotic medicine.   A viral cough must run its course and will not respond to antibiotics.   Your caregiver may recommend other treatments if you have a chronic cough.  HOME CARE INSTRUCTIONS   Only take over-the-counter or prescription medicines for pain, discomfort, or fever as directed by your caregiver. Use cough suppressants only as directed by your caregiver.  Use a cold steam vaporizer or humidifier in your bedroom or home to help loosen secretions.   Sleep in a semi-upright position if your cough is worse at night.   Rest as needed.   Stop smoking if you smoke.  SEEK IMMEDIATE MEDICAL CARE IF:   You have pus in your sputum.   Your cough starts to worsen.   You cannot control your cough with suppressants and are losing sleep.   You begin coughing up blood.   You have difficulty breathing.   You develop pain which is getting worse or is  uncontrolled with medicine.   You have a fever.  MAKE SURE YOU:   Understand these instructions.   Will watch your condition.   Will get help right away if you are not doing well or get worse.  Document Released: 07/02/2010 Document Revised: 12/23/2010 Document Reviewed: 07/02/2010 [facility] Patient Information 2012 [place], [place].

## 2011-06-08 NOTE — Assessment & Plan Note (Signed)
I think she has asthma, I will check her CXR to look for pna, mass, edema

## 2011-06-08 NOTE — Assessment & Plan Note (Signed)
She was given depo-medrol IM injection and will start using dulera

## 2011-06-08 NOTE — Assessment & Plan Note (Signed)
I have asked her to get a NCS/EMG done and I will check her labs to look for metabolic disease such as B12 defic, hypothyroidism, abnormal lytes, anemia, etc

## 2011-06-30 ENCOUNTER — Ambulatory Visit: Payer: Federal, State, Local not specified - PPO | Admitting: Internal Medicine

## 2011-06-30 DIAGNOSIS — Z0289 Encounter for other administrative examinations: Secondary | ICD-10-CM

## 2011-07-12 ENCOUNTER — Other Ambulatory Visit: Payer: Self-pay | Admitting: Obstetrics and Gynecology

## 2011-07-12 DIAGNOSIS — Z1231 Encounter for screening mammogram for malignant neoplasm of breast: Secondary | ICD-10-CM

## 2011-07-29 ENCOUNTER — Ambulatory Visit: Payer: Federal, State, Local not specified - PPO

## 2011-08-26 ENCOUNTER — Encounter: Payer: Federal, State, Local not specified - PPO | Admitting: Vascular Surgery

## 2011-09-01 ENCOUNTER — Encounter: Payer: Self-pay | Admitting: Vascular Surgery

## 2011-09-02 ENCOUNTER — Ambulatory Visit (INDEPENDENT_AMBULATORY_CARE_PROVIDER_SITE_OTHER): Payer: Federal, State, Local not specified - PPO | Admitting: *Deleted

## 2011-09-02 ENCOUNTER — Ambulatory Visit (INDEPENDENT_AMBULATORY_CARE_PROVIDER_SITE_OTHER): Payer: Federal, State, Local not specified - PPO | Admitting: Vascular Surgery

## 2011-09-02 ENCOUNTER — Encounter: Payer: Self-pay | Admitting: Vascular Surgery

## 2011-09-02 VITALS — BP 146/88 | HR 55 | Resp 18 | Ht 66.0 in | Wt 265.0 lb

## 2011-09-02 DIAGNOSIS — I872 Venous insufficiency (chronic) (peripheral): Secondary | ICD-10-CM

## 2011-09-02 DIAGNOSIS — I83893 Varicose veins of bilateral lower extremities with other complications: Secondary | ICD-10-CM

## 2011-09-02 NOTE — Progress Notes (Signed)
[facility]  Referred by:  Jacques Navy, MD [address] [place], [place] [address]  Reason for referral: Swollen B legs  History of Present Illness  Alicia [name] is a 56 y.o. (Dec 28, 1955) female who presents with chief complaint: swollen B legs.  Patient notes onset of swelling 12 months ago, not associated with anything.  The patient has had no history of DVT, known history of varicose vein, no history of venous stasis ulcers, no history of  Lymphedema and no history of skin changes in lower legs.  There is a family history of venous disorders.  The patient has used OTC compression stockings in the past.  She notes: aching and swelling in L > R leg at the end of day, which improves with leg elevation.  Past Medical History  Diagnosis Date  . Palpitations   . Varicose veins   . Near syncope   . Chronic tension headaches   . Nephrolithiasis     history of  . Hypertension     Past Surgical History  Procedure Date  . Abdominal hysterectomy   . Cholecystectomy   . Tonsillectomy     History   Social History  . Marital Status: Married    Spouse Name: N/A    Number of Children: 3  . Years of Education: 16   Occupational History  . teacher [employer]   Social History Main Topics  . Smoking status: Never Smoker   . Smokeless tobacco: Never Used  . Alcohol Use: No  . Drug Use: No  . Sexually Active: Yes -- Female partner(s)   Other Topics Concern  . Not on file   Social History Narrative   [name]- BA. Married '79. 2 daughters- '82, '93, 1 son- '87. 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 SO- good health    Family History  Problem Relation Age of Onset  . Cancer Mother     CERVICAL  . Heart disease Father   . Stroke Father     Current Outpatient Prescriptions on File Prior to Visit  Medication Sig Dispense Refill  . Chlorpheniramine-PSE-Ibuprofen (ADVIL ALLERGY SINUS PO) Take 1 tablet by mouth every 8 (eight) hours as  needed. For cold symptoms      . hydrochlorothiazide (HYDRODIURIL) 25 MG tablet Take 1 tablet (25 mg total) by mouth daily.  90 tablet  3  . ibuprofen (ADVIL,MOTRIN) 200 MG tablet Take 400 mg by mouth every 6 (six) hours as needed. For pain      . mometasone-formoterol (DULERA) 100-5 MCG/ACT AERO Inhale 2 puffs into the lungs 2 (two) times daily.  2 Inhaler  0    Allergies  Allergen Reactions  . Contrast Media (Iodinated Diagnostic Agents)   . Iohexol     Pt.states she stopped breathing and was admitted to the ICU~10 years ago  . Morphine   . Penicillins   . Sulfamethoxazole W-Trimethoprim   . Sulfonamide Derivatives   . Ciprofloxacin Itching and Rash    REVIEW OF SYSTEMS:  (Positives checked otherwise negative)  CARDIOVASCULAR:  [ ]  chest pain, [ ]  chest pressure, [ ]  palpitations, [ ]  shortness of breath when laying flat, [ ]  shortness of breath with exertion, [ ]  pain in feet when laying flat, [ ]  history of blood clot in veins (DVT), [ ]  history of phlebitis, [x]  swelling in legs, [x]  varicose veins  PULMONARY:  [ ]  productive cough, [ ]  asthma, [ ]  wheezing  NEUROLOGIC:  [ ]   weakness in arms or legs, [ ]  numbness in arms or legs, [ ]  difficulty speaking or slurred speech, [ ]  temporary loss of vision in one eye, [ ]  dizziness  HEMATOLOGIC:  [ ]  bleeding problems, [ ]  problems with blood clotting too easily  MUSCULOSKEL:  [ ]  joint pain, [ ]  joint swelling  GASTROINTEST:  [ ]   Vomiting blood, [ ]   Blood in stool     GENITOURINARY:  [ ]   Burning with urination, [ ]   Blood in urine  PSYCHIATRIC:  [ ]  history of major depression  INTEGUMENTARY:  [ ]  rashes, [ ]  ulcers  CONSTITUTIONAL:  [ ]  fever, [ ]  chills  Physical Examination  Filed Vitals:   09/02/11 1228  BP: 146/88  Pulse: 55  Resp: 18  Height: 5\' 6"  (1.676 m)  Weight: 265 lb (120.203 kg)   Body mass index is 42.77 kg/(m^2).  General: A&O x 3, WDWN, obese  Head: [place]/AT  Ear/Nose/Throat: Hearing grossly  intact, nares w/o erythema or drainage, oropharynx w/o Erythema/Exudate  Eyes: PERRLA, EOMI  Neck: Supple, no nuchal rigidity, no palpable LAD  Pulmonary: Sym exp, good air movt, CTAB, no rales, rhonchi, & wheezing  Cardiac: RRR, Nl S1, S2, no Murmurs, rubs or gallops  Vascular: Vessel Right Left  Radial Palpable Palpable  Ulnar Palpable Palpable  Brachial Palpable Palpable  Carotid Palpable, without bruit Palpable, without bruit  Aorta Not palpable N/A  Femoral Palpable Palpable  Popliteal Not palpable Not palpable  PT Palpable Palpable  DP Palpable Palpable   Gastrointestinal: soft, NTND, -G/R, - HSM, - masses, - CVAT B  Musculoskeletal: M/S 5/5 throughout , Extremities without ischemic changes , B varicose veins, no LDS B  Neurologic: CN 2-12 intact , Pain and light touch intact in extremities , Motor exam as listed above  Psychiatric: Judgment intact, Mood & affect appropriate for pt's clinical situation  Dermatologic: See M/S exam for extremity exam, no rashes otherwise noted  Lymph : No Cervical, Axillary, or Inguinal lymphadenopathy   Non-Invasive Vascular Imaging  BLE Venous Insufficiency Duplex (Date: 09/02/11):   RLE: no DVT, + deep vein reflux, accessory vein reflux, and perforator vein reflux  LLE: no DVT, + deep vein and GSV reflux  Medical Decision Making  Alicia [name] is a 56 y.o. female who presents with: chronic venous insufficiency (C2-3).   Based on the patient's history and examination, I recommend: B thigh high compression stockings 20-30 mm Hg for 3 months minimal.  She will follow up with the [facility] in 3 months for evaluation for EVLA of L GSV.  Thank you for allowing [place] to participate in this patient's care.  Leonides Sake, MD [facility] Office: [phone] Pager: [phone]  09/02/2011, 1:03 PM

## 2011-11-16 ENCOUNTER — Emergency Department (HOSPITAL_COMMUNITY)
Admission: EM | Admit: 2011-11-16 | Discharge: 2011-11-16 | Disposition: A | Discharge status: Home / Self Care | Payer: Federal, State, Local not specified - PPO | Source: Home / Self Care | Attending: Family Medicine | Admitting: Family Medicine

## 2011-11-16 ENCOUNTER — Encounter (HOSPITAL_COMMUNITY): Payer: Self-pay

## 2011-11-16 DIAGNOSIS — R600 Localized edema: Secondary | ICD-10-CM

## 2011-11-16 DIAGNOSIS — R609 Edema, unspecified: Secondary | ICD-10-CM

## 2011-11-16 LAB — POCT URINALYSIS DIP (DEVICE)
Bilirubin Urine: NEGATIVE
Glucose, UA: NEGATIVE mg/dL
Hgb urine dipstick: NEGATIVE
Ketones, ur: NEGATIVE mg/dL
Leukocytes, UA: NEGATIVE
Nitrite: NEGATIVE
Protein, ur: NEGATIVE mg/dL
Specific Gravity, Urine: 1.015 (ref 1.005–1.030)
Urobilinogen, UA: 0.2 mg/dL (ref 0.0–1.0)
pH: 5.5 (ref 5.0–8.0)

## 2011-11-16 LAB — POCT I-STAT, CHEM 8
BUN: 12 mg/dL (ref 6–23)
Calcium, Ion: 1.19 mmol/L (ref 1.12–1.23)
Chloride: 104 mEq/L (ref 96–112)
Creatinine, Ser: 0.8 mg/dL (ref 0.50–1.10)
Glucose, Bld: 85 mg/dL (ref 70–99)
HCT: 43 % (ref 36.0–46.0)
Hemoglobin: 14.6 g/dL (ref 12.0–15.0)
Potassium: 3.7 mEq/L (ref 3.5–5.1)
Sodium: 143 mEq/L (ref 135–145)
TCO2: 26 mmol/L (ref 0–100)

## 2011-11-16 MED ORDER — FUROSEMIDE 40 MG PO TABS: 40.0000 mg | ORAL_TABLET | Freq: Every day | ORAL | Status: DC

## 2011-11-16 NOTE — ED Notes (Signed)
Patient c/o dizziness, swollen feet and ankles, states that she has been urinating more than usual but no other sx other than frequency, states that she is suppose to take her HCTZ daily but does not because she states that it gives her a headache, last took it 2 days ago

## 2011-11-16 NOTE — ED Provider Notes (Signed)
History     CSN: [id number]  Arrival date & time 11/16/11  1503   First MD Initiated Contact with Patient 11/16/11 1536      Chief Complaint  Patient presents with  . Dizziness    (Consider location/radiation/quality/duration/timing/severity/associated sxs/prior treatment) Patient is a 56 y.o. female presenting with hypertension. The history is provided by the patient.  Hypertension This is a new problem. The current episode started more than 1 week ago. The problem has not changed since onset.Pertinent negatives include no chest pain, no abdominal pain and no shortness of breath.    Past Medical History  Diagnosis Date  . Palpitations   . Varicose veins   . Near syncope   . Chronic tension headaches   . Nephrolithiasis     history of  . Hypertension     Past Surgical History  Procedure Date  . Abdominal hysterectomy   . Cholecystectomy   . Tonsillectomy     Family History  Problem Relation Age of Onset  . Cancer Mother     CERVICAL  . Heart disease Father   . Stroke Father     History  Substance Use Topics  . Smoking status: Never Smoker   . Smokeless tobacco: Never Used  . Alcohol Use: No    OB History    Grav Para Term Preterm Abortions TAB SAB Ect Mult Living                  Review of Systems  Constitutional: Negative.   Respiratory: Negative for shortness of breath.   Cardiovascular: Positive for leg swelling. Negative for chest pain.  Gastrointestinal: Negative.  Negative for abdominal pain.  Skin: Negative.     Allergies  Contrast media; Iohexol; Morphine; Penicillins; Sulfamethoxazole w-trimethoprim; Sulfonamide derivatives; and Ciprofloxacin  Home Medications   Current Outpatient Rx  Name Route Sig Dispense Refill  . ADVIL ALLERGY SINUS PO Oral Take 1 tablet by mouth every 8 (eight) hours as needed. For cold symptoms    . FUROSEMIDE 40 MG PO TABS Oral Take 1 tablet (40 mg total) by mouth daily. For ankle swelling 15 tablet 0  .  HYDROCHLOROTHIAZIDE 25 MG PO TABS Oral Take 1 tablet (25 mg total) by mouth daily. 90 tablet 3  . IBUPROFEN 200 MG PO TABS Oral Take 400 mg by mouth every 6 (six) hours as needed. For pain    . MOMETASONE FURO-FORMOTEROL FUM 100-5 MCG/ACT IN AERO Inhalation Inhale 2 puffs into the lungs 2 (two) times daily. 2 Inhaler 0  . VALACYCLOVIR HCL 1 G PO TABS        BP 149/90  Pulse 70  Temp 98.2 F (36.8 C) (Oral)  Resp 18  SpO2 96%  Physical Exam  Nursing note and vitals reviewed. Constitutional: She is oriented to person, place, and time. She appears well-developed and well-nourished.  Neck: Normal range of motion. Neck supple.  Pulmonary/Chest: Effort normal and breath sounds normal.  Abdominal: Soft. Bowel sounds are normal.  Musculoskeletal: She exhibits edema.  Neurological: She is alert and oriented to person, place, and time.  Skin: Skin is warm and dry.    ED Course  Procedures (including critical care time)   Labs Reviewed  POCT URINALYSIS DIP (DEVICE)  POCT I-STAT, CHEM 8   No results found.   1. Edema of lower extremity       MDM          Linna Hoff, MD 11/16/11 1700

## 2011-12-05 ENCOUNTER — Encounter: Payer: Self-pay | Admitting: Vascular Surgery

## 2011-12-06 ENCOUNTER — Ambulatory Visit: Payer: Federal, State, Local not specified - PPO | Admitting: Vascular Surgery

## 2012-01-03 ENCOUNTER — Encounter: Payer: Self-pay | Admitting: Internal Medicine

## 2012-01-03 ENCOUNTER — Ambulatory Visit (INDEPENDENT_AMBULATORY_CARE_PROVIDER_SITE_OTHER): Payer: Federal, State, Local not specified - PPO | Admitting: Internal Medicine

## 2012-01-03 VITALS — BP 122/88 | HR 62 | Temp 97.7°F | Ht 66.0 in | Wt 260.0 lb

## 2012-01-03 DIAGNOSIS — S46912A Strain of unspecified muscle, fascia and tendon at shoulder and upper arm level, left arm, initial encounter: Secondary | ICD-10-CM

## 2012-01-03 DIAGNOSIS — IMO0002 Reserved for concepts with insufficient information to code with codable children: Secondary | ICD-10-CM

## 2012-01-03 MED ORDER — DICLOFENAC SODIUM 75 MG PO TBEC: 75.0000 mg | DELAYED_RELEASE_TABLET | Freq: Two times a day (BID) | ORAL | Status: DC

## 2012-01-03 MED ORDER — CYCLOBENZAPRINE HCL 10 MG PO TABS: 10.0000 mg | ORAL_TABLET | Freq: Three times a day (TID) | ORAL | Status: DC | PRN

## 2012-01-03 NOTE — Patient Instructions (Addendum)
Shoulder Exercises  EXERCISES   RANGE OF MOTION (ROM) AND STRETCHING EXERCISES  These exercises may help you when beginning to rehabilitate your injury. Your symptoms may resolve with or without further involvement from your physician, physical therapist or athletic trainer. While completing these exercises, remember:   · Restoring tissue flexibility helps normal motion to return to the joints. This allows healthier, less painful movement and activity.  · An effective stretch should be held for at least 30 seconds.  · A stretch should never be painful. You should only feel a gentle lengthening or release in the stretched tissue.  ROM - Pendulum  · Bend at the waist so that your right / left arm falls away from your body. Support yourself with your opposite hand on a solid surface, such as a table or a countertop.  · Your right / left arm should be perpendicular to the ground. If it is not perpendicular, you need to lean over farther. Relax the muscles in your right / left arm and shoulder as much as possible.  · Gently sway your hips and trunk so they move your right / left arm without any use of your right / left shoulder muscles.  · Progress your movements so that your right / left arm moves side to side, then forward and backward, and finally, both clockwise and counterclockwise.  · Complete __________ repetitions in each direction. Many people use this exercise to relieve discomfort in their shoulder as well as to gain range of motion.  Repeat __________ times. Complete this exercise __________ times per day.  STRETCH - Flexion, Standing  · Stand with good posture. With an underhand grip on your right / left hand and an overhand grip on the opposite hand, grasp a broomstick or cane so that your hands are a little more than shoulder-width apart.  · Keeping your right / left elbow straight and shoulder muscles relaxed, push the stick with your opposite hand to raise your right / left arm in front of your body and  then overhead. Raise your arm until you feel a stretch in your right / left shoulder, but before you have increased shoulder pain.  · Try to avoid shrugging your right / left shoulder as your arm rises by keeping your shoulder blade tucked down and toward your mid-back spine. Hold __________ seconds.  · Slowly return to the starting position.  Repeat __________ times. Complete this exercise __________ times per day.  STRETCH - Internal Rotation  · Place your right / left hand behind your back, palm-up.  · Throw a towel or belt over your opposite shoulder. Grasp the towel/belt with your right / left hand.  · While keeping an upright posture, gently pull up on the towel/belt until you feel a stretch in the front of your right / left shoulder.  · Avoid shrugging your right / left shoulder as your arm rises by keeping your shoulder blade tucked down and toward your mid-back spine.  · Hold __________. Release the stretch by lowering your opposite hand.  Repeat __________ times. Complete this exercise __________ times per day.  STRETCH - External Rotation and Abduction  · Stagger your stance through a doorframe. It does not matter which foot is forward.  · As instructed by your physician, physical therapist or athletic trainer, place your hands:  · And forearms above your head and on the door frame.  · And forearms at head-height and on the door frame.  · At elbow-height and   on the door frame.  · Keeping your head and chest upright and your stomach muscles tight to prevent over-extending your low-back, slowly shift your weight onto your front foot until you feel a stretch across your chest and/or in the front of your shoulders.  · Hold __________ seconds. Shift your weight to your back foot to release the stretch.  Repeat __________ times. Complete this stretch __________ times per day.   STRENGTHENING EXERCISES   These exercises may help you when beginning to rehabilitate your injury. They may resolve your symptoms with  or without further involvement from your physician, physical therapist or athletic trainer. While completing these exercises, remember:   · Muscles can gain both the endurance and the strength needed for everyday activities through controlled exercises.  · Complete these exercises as instructed by your physician, physical therapist or athletic trainer. Progress the resistance and repetitions only as guided.  · You may experience muscle soreness or fatigue, but the pain or discomfort you are trying to eliminate should never worsen during these exercises. If this pain does worsen, stop and make certain you are following the directions exactly. If the pain is still present after adjustments, discontinue the exercise until you can discuss the trouble with your clinician.  · If advised by your physician, during your recovery, avoid activity or exercises which involve actions that place your right / left hand or elbow above your head or behind your back or head. These positions stress the tissues which are trying to heal.  STRENGTH - Scapular Depression and Adduction  · With good posture, sit on a firm chair. Supported your arms in front of you with pillows, arm rests or a table top. Have your elbows in line with the sides of your body.  · Gently draw your shoulder blades down and toward your mid-back spine. Gradually increase the tension without tensing the muscles along the top of your shoulders and the back of your neck.  · Hold for __________ seconds. Slowly release the tension and relax your muscles completely before completing the next repetition.  · After you have practiced this exercise, remove the arm support and complete it in standing as well as sitting.  Repeat __________ times. Complete this exercise __________ times per day.   STRENGTH - External Rotators  · Secure a rubber exercise band/tubing to a fixed object so that it is at the same height as your right / left elbow when you are standing or sitting on a  firm surface.  · Stand or sit so that the secured exercise band/tubing is at your side that is not injured.  · Bend your elbow 90 degrees. Place a folded towel or small pillow under your right / left arm so that your elbow is a few inches away from your side.  · Keeping the tension on the exercise band/tubing, pull it away from your body, as if pivoting on your elbow. Be sure to keep your body steady so that the movement is only coming from your shoulder rotating.  · Hold __________ seconds. Release the tension in a controlled manner as you return to the starting position.  Repeat __________ times. Complete this exercise __________ times per day.   STRENGTH - Supraspinatus  · Stand or sit with good posture. Grasp a __________ weight or an exercise band/tubing so that your hand is "thumbs-up," like when you shake hands.  · Slowly lift your right / left hand from your thigh into the air, traveling about 30   degrees from straight out at your side. Lift your hand to shoulder height or as far as you can without increasing any shoulder pain. Initially, many people do not lift their hands above shoulder height.  · Avoid shrugging your right / left shoulder as your arm rises by keeping your shoulder blade tucked down and toward your mid-back spine.  · Hold for __________ seconds. Control the descent of your hand as you slowly return to your starting position.  Repeat __________ times. Complete this exercise __________ times per day.   STRENGTH - Shoulder Extensors  · Secure a rubber exercise band/tubing so that it is at the height of your shoulders when you are either standing or sitting on a firm arm-less chair.  · With a thumbs-up grip, grasp an end of the band/tubing in each hand. Straighten your elbows and lift your hands straight in front of you at shoulder height. Step back away from the secured end of band/tubing until it becomes tense.  · Squeezing your shoulder blades together, pull your hands down to the sides of  your thighs. Do not allow your hands to go behind you.  · Hold for __________ seconds. Slowly ease the tension on the band/tubing as you reverse the directions and return to the starting position.  Repeat __________ times. Complete this exercise __________ times per day.   STRENGTH - Scapular Retractors  · Secure a rubber exercise band/tubing so that it is at the height of your shoulders when you are either standing or sitting on a firm arm-less chair.  · With a palm-down grip, grasp an end of the band/tubing in each hand. Straighten your elbows and lift your hands straight in front of you at shoulder height. Step back away from the secured end of band/tubing until it becomes tense.  · Squeezing your shoulder blades together, draw your elbows back as you bend them. Keep your upper arm lifted away from your body throughout the exercise.  · Hold __________ seconds. Slowly ease the tension on the band/tubing as you reverse the directions and return to the starting position.  Repeat __________ times. Complete this exercise __________ times per day.  STRENGTH - Scapular Depressors  · Find a sturdy chair without wheels, such as a from a dining room table.  · Keeping your feet on the floor, lift your bottom from the seat and lock your elbows.  · Keeping your elbows straight, allow gravity to pull your body weight down. Your shoulders will rise toward your ears.  · Raise your body against gravity by drawing your shoulder blades down your back, shortening the distance between your shoulders and ears. Although your feet should always maintain contact with the floor, your feet should progressively support less body weight as you get stronger.  · Hold __________ seconds. In a controlled and slow manner, lower your body weight to begin the next repetition.  Repeat __________ times. Complete this exercise __________ times per day.   Document Released: 11/17/2004 Document Revised: 03/28/2011 Document Reviewed:  04/17/2008  [employer]® Patient Information ©2013 [employer].

## 2012-01-03 NOTE — Progress Notes (Signed)
Subjective:    Patient ID: Alicia [name], female    DOB: 1955-07-31, 56 y.o.   MRN: [id number]  HPI  Pt presents to the clinic today with c/o right arm pain x 2 months that has gotten worse over the last weak. She feels a muscle soreness in her left tricep. She has difficulty reaching for things due to pain. She is unable to lift her arm above her head or reach behind her back due to the pain. The pain is intermittent 5/10. She does take ibuprofen which does help relieve the pain. She has never had problems or injury to her shoulder.  Review of Systems      Past Medical History  Diagnosis Date  . Palpitations   . Varicose veins   . Near syncope   . Chronic tension headaches   . Nephrolithiasis     history of  . Hypertension     Current Outpatient Prescriptions  Medication Sig Dispense Refill  . furosemide (LASIX) 40 MG tablet Take 1 tablet (40 mg total) by mouth daily. For ankle swelling  15 tablet  0  . hydrochlorothiazide (HYDRODIURIL) 25 MG tablet Take 1 tablet (25 mg total) by mouth daily.  90 tablet  3  . ibuprofen (ADVIL,MOTRIN) 200 MG tablet Take 400 mg by mouth every 6 (six) hours as needed. For pain      . cyclobenzaprine (FLEXERIL) 10 MG tablet Take 1 tablet (10 mg total) by mouth 3 (three) times daily as needed for muscle spasms.  30 tablet  0  . diclofenac (VOLTAREN) 75 MG EC tablet Take 1 tablet (75 mg total) by mouth 2 (two) times daily.  30 tablet  0    Allergies  Allergen Reactions  . Contrast Media (Iodinated Diagnostic Agents)   . Iohexol     Pt.states she stopped breathing and was admitted to the ICU~10 years ago  . Morphine   . Penicillins   . Sulfamethoxazole W-Trimethoprim   . Sulfonamide Derivatives   . Ciprofloxacin Itching and Rash    Family History  Problem Relation Age of Onset  . Cancer Mother     CERVICAL  . Heart disease Father   . Stroke Father     History   Social History  . Marital Status: Married    Spouse Name: N/A     Number of Children: 3  . Years of Education: 16   Occupational History  . teacher [employer]   Social History Main Topics  . Smoking status: Current Every Day Smoker    Types: Cigarettes  . Smokeless tobacco: Never Used  . Alcohol Use: No  . Drug Use: No  . Sexually Active: Yes -- Female partner(s)   Other Topics Concern  . Not on file   Social History Narrative   [name]- BA. Married '79. 2 daughters- '82, '93, 1 son- '87. 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 SO- good health     Constitutional: Denies fever, malaise, fatigue, headache or abrupt weight changes.  Musculoskeletal: Pt reports shoulder pain and decrease range of motion of the left arm. Denies difficulty with gait or joint swelling.  Skin: Denies redness, rashes, lesions or ulcercations.  Neurological: Denies numbness and tingling of her hands or fingers, dizziness, difficulty with memory, difficulty with speech or problems with balance and coordination.   No other specific complaints in a complete review of systems (except as listed in HPI above).  Objective:   Physical Exam  BP 122/88  Pulse 62  Temp 97.7 F (36.5 C) (Oral)  Ht 5\' 6"  (1.676 m)  Wt 260 lb (117.935 kg)  BMI 41.97 kg/m2  SpO2 97% Wt Readings from Last 3 Encounters:  01/03/12 260 lb (117.935 kg)  09/02/11 265 lb (120.203 kg)  06/08/11 262 lb (118.842 kg)    General: Appears her stated age, well developed, well nourished in NAD.  Cardiovascular: Normal rate and rhythm. S1,S2 noted.  No murmur, rubs or gallops noted. No JVD or BLE edema. No carotid bruits noted. Pulmonary/Chest: Normal effort and positive vesicular breath sounds. No respiratory distress. No wheezes, rales or ronchi noted.  Musculoskeletal: decreased range of motion of the left shoulder. No signs of joint swelling. No crepitus noted.  Neurological: Alert and oriented. Cranial nerves II-XII intact. Coordination normal. +DTRs bilaterally.        Assessment & Plan:    Left Shoulder Pain, new onset with additional workup required:  Will try diclofenac and flexeril Perform shoulder exercises per handout May need referral to orthopedics  RTC as needed or if symptoms persist

## 2012-01-12 ENCOUNTER — Other Ambulatory Visit: Payer: Self-pay | Admitting: Obstetrics and Gynecology

## 2012-01-12 DIAGNOSIS — Z780 Asymptomatic menopausal state: Secondary | ICD-10-CM

## 2012-02-07 ENCOUNTER — Ambulatory Visit: Payer: Federal, State, Local not specified - PPO

## 2012-03-02 HISTORY — PX: OTHER SURGICAL HISTORY: SHX169

## 2012-03-05 ENCOUNTER — Ambulatory Visit
Admission: RE | Admit: 2012-03-05 | Discharge: 2012-03-05 | Disposition: A | Discharge status: Home / Self Care | Payer: Federal, State, Local not specified - PPO | Source: Ambulatory Visit | Attending: Obstetrics and Gynecology | Admitting: Obstetrics and Gynecology

## 2012-03-05 DIAGNOSIS — Z1231 Encounter for screening mammogram for malignant neoplasm of breast: Secondary | ICD-10-CM

## 2012-03-19 ENCOUNTER — Encounter: Payer: Self-pay | Admitting: Vascular Surgery

## 2012-03-20 ENCOUNTER — Ambulatory Visit: Payer: Federal, State, Local not specified - PPO | Admitting: Vascular Surgery

## 2012-04-02 ENCOUNTER — Encounter: Payer: Self-pay | Admitting: Vascular Surgery

## 2012-04-03 ENCOUNTER — Ambulatory Visit: Payer: Federal, State, Local not specified - PPO | Admitting: Vascular Surgery

## 2012-04-04 ENCOUNTER — Other Ambulatory Visit (INDEPENDENT_AMBULATORY_CARE_PROVIDER_SITE_OTHER): Payer: Federal, State, Local not specified - PPO

## 2012-04-04 ENCOUNTER — Encounter: Payer: Self-pay | Admitting: Internal Medicine

## 2012-04-04 ENCOUNTER — Ambulatory Visit (INDEPENDENT_AMBULATORY_CARE_PROVIDER_SITE_OTHER): Payer: Federal, State, Local not specified - PPO | Admitting: Internal Medicine

## 2012-04-04 ENCOUNTER — Ambulatory Visit (INDEPENDENT_AMBULATORY_CARE_PROVIDER_SITE_OTHER)
Admission: RE | Admit: 2012-04-04 | Discharge: 2012-04-04 | Disposition: A | Discharge status: Home / Self Care | Payer: Federal, State, Local not specified - PPO | Source: Ambulatory Visit | Attending: Internal Medicine | Admitting: Internal Medicine

## 2012-04-04 VITALS — BP 150/92 | HR 61 | Temp 97.7°F | Resp 16 | Ht 66.0 in | Wt 258.0 lb

## 2012-04-04 DIAGNOSIS — E559 Vitamin D deficiency, unspecified: Secondary | ICD-10-CM

## 2012-04-04 DIAGNOSIS — M79609 Pain in unspecified limb: Secondary | ICD-10-CM

## 2012-04-04 DIAGNOSIS — I10 Essential (primary) hypertension: Secondary | ICD-10-CM

## 2012-04-04 DIAGNOSIS — M79622 Pain in left upper arm: Secondary | ICD-10-CM

## 2012-04-04 DIAGNOSIS — Z0000 Encounter for general adult medical examination without abnormal findings: Secondary | ICD-10-CM

## 2012-04-04 LAB — COMPREHENSIVE METABOLIC PANEL
ALT: 23 U/L (ref 0–35)
AST: 21 U/L (ref 0–37)
Albumin: 4.2 g/dL (ref 3.5–5.2)
Alkaline Phosphatase: 76 U/L (ref 39–117)
BUN: 17 mg/dL (ref 6–23)
CO2: 30 mEq/L (ref 19–32)
Calcium: 9.4 mg/dL (ref 8.4–10.5)
Chloride: 105 mEq/L (ref 96–112)
Creatinine, Ser: 0.7 mg/dL (ref 0.4–1.2)
GFR: 91.99 mL/min (ref >60.00)
Glucose, Bld: 96 mg/dL (ref 70–99)
Potassium: 4.6 mEq/L (ref 3.5–5.1)
Sodium: 143 mEq/L (ref 135–145)
Total Bilirubin: 0.8 mg/dL (ref 0.3–1.2)
Total Protein: 7.8 g/dL (ref 6.0–8.3)

## 2012-04-04 LAB — LIPID PANEL
Cholesterol: 200 mg/dL (ref 0–200)
HDL: 65.5 mg/dL (ref >39.00)
LDL Cholesterol: 110 mg/dL — ABNORMAL HIGH (ref 0–99)
Total CHOL/HDL Ratio: 3
Triglycerides: 121 mg/dL (ref 0.0–149.0)
VLDL: 24.2 mg/dL (ref 0.0–40.0)

## 2012-04-04 MED ORDER — LOSARTAN POTASSIUM-HCTZ 50-12.5 MG PO TABS: 1.0000 | ORAL_TABLET | Freq: Every day | ORAL | Status: DC

## 2012-04-04 NOTE — Progress Notes (Signed)
  Subjective:    Patient ID: Alicia [name], female    DOB: 1955-02-06, 57 y.o.   MRN: [id number]  HPI Alicia [name] presents for pain in the proximal left UE - for 4 1/2 months. No weakness, will have some radiation to the distal arm, no paresthesia. Doesn't seem to be in the joint. She was seen in Dec '13 by Alicia [name] - diagnosed with shoulder strain and prescribed voltaren and flexerial, the later which she did not take.  She has been diagnosed with vit D deficiency and has been taking 2,000 iu daily for 12 months. She never was prescribded high dose ergocalciferol.   She has had cataract extraction with IOL and lasik surgery OD and OS is coming up soon- Alicia [name].   She is going to see Alicia [name] for evaluation of varicose veins.   Past Medical History  Diagnosis Date  . Palpitations   . Varicose veins   . Near syncope   . Chronic tension headaches   . Nephrolithiasis     history of  . Hypertension    Past Surgical History  Procedure Laterality Date  . Abdominal hysterectomy    . Cholecystectomy    . Tonsillectomy     Family History  Problem Relation Age of Onset  . Cancer Mother     CERVICAL  . Heart disease Father   . Stroke Father    History   Social History  . Marital Status: Married    Spouse Name: N/A    Number of Children: 3  . Years of Education: 16   Occupational History  . teacher [employer]   Social History Main Topics  . Smoking status: Never Smoker   . Smokeless tobacco: Never Used  . Alcohol Use: No  . Drug Use: No  . Sexually Active: Yes -- Female partner(s)   Other Topics Concern  . Not on file   Social History Narrative   Alicia [name] state- BA. Married '79. 2 daughters- '82, '93, 1 son- '87. 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 SO- good health    Current Outpatient Prescriptions on File Prior to Visit  Medication Sig Dispense Refill  . furosemide (LASIX) 40 MG tablet Take 1 tablet (40 mg total) by mouth daily. For ankle swelling  15 tablet   0  . hydrochlorothiazide (HYDRODIURIL) 25 MG tablet Take 1 tablet (25 mg total) by mouth daily.  90 tablet  3  . ibuprofen (ADVIL,MOTRIN) 200 MG tablet Take 400 mg by mouth every 6 (six) hours as needed. For pain       No current facility-administered medications on file prior to visit.      Review of Systems System review is negative for any constitutional, cardiac, pulmonary, GI or neuro symptoms or complaints other than as described in the HPI.      Objective:   Physical Exam        Assessment & Plan:

## 2012-04-04 NOTE — Patient Instructions (Addendum)
1. Vitamin D deficiency - will check a level today. If still low will give ergocalciferol 50,000 iu weekly for 8-12 weeks.  2. Routine lab- will check cholesterol levels and kidney function  3. Blood pressure - definitely at a level that we need to change treatment.  Plan Add an ARB drug - Losartan. Will use a combination product that you will take once a day  Blood pressure checks - you can do these on your own or her. If on your own you can communicated readings via MyChart  4. Arm pain - going on too long. May be shoulder but I would also be concerned about possible cervical spine disease, e.g. C3-4 disk Plan Plain x-rays of the neck. If totally negative and the pain persists will consider ortho or sports medicine referral. If disk disease suggested will consider Physical therapy and increased anti-inflammatory treatment.  5. Weight: Diet management: smart food choices, PORTION SIZE CONTROL, regular exercise. Goal - to loose 1-2 lbs.month. Target weight - 175 lbs.

## 2012-04-05 DIAGNOSIS — Z0000 Encounter for general adult medical examination without abnormal findings: Secondary | ICD-10-CM | POA: Insufficient documentation

## 2012-04-05 NOTE — Assessment & Plan Note (Signed)
BP Readings from Last 3 Encounters:  04/04/12 150/92  01/03/12 122/88  11/16/11 149/90   Patient has variable BP readings but admits to a trend of high readings with SBP frequently 140+. She does have some low readings down to SBP 109 on one occasion. Discussed need for overall control  Plan - Add ARB to her regimen - change to combination product Losartan/Hct  Monitor BP at home and report back via MyChart

## 2012-04-05 NOTE — Assessment & Plan Note (Signed)
Diagnosed with vitamin D deficiency - no original labs available. She has not had DEXA - no established diagnosis of osteopenia/osteoporosis  Plan Vitamin D level with recommendations to follow.

## 2012-04-05 NOTE — Assessment & Plan Note (Signed)
She is current with Gyn. She is current for colorectal and breast cancer screening. Immunization is current.   Plan Routine interval lab: lipids, Bmet.

## 2012-04-05 NOTE — Assessment & Plan Note (Signed)
Discussed importance of weight management, particularly as it pertains to hypertension  Plan Diet management: smart food choices, PORTION SIZE CONTROL, regular exercise. Goal - to loose 1-2 lbs.month. Target weight - 175

## 2012-04-05 NOTE — Assessment & Plan Note (Signed)
Reports 5+ months of pain in the left proximal UE - midway between elbow and shoulder. Exam without crepitus or limit in passive ROM. Active ROM left shoulder somewhat limited. Concern for possible cervical origin.  Plan  C-spine series.              For pain continue NSAID  Addendum: Findings: There are minimal anterior osteophytes is C6-7. There is  no disc space narrowing or posterior osteophyte. There is no  foraminal or spinal stenosis. No facet arthritis. Prevertebral  soft tissues are normal.  IMPRESSION:  No significant abnormality of the cervical spine.  For continued pain will refer to ortho/sports medicine

## 2012-04-08 ENCOUNTER — Encounter: Payer: Self-pay | Admitting: Internal Medicine

## 2012-04-08 LAB — VITAMIN D 1,25 DIHYDROXY
Vitamin D 1, 25 (OH)2 Total: 65 pg/mL (ref 18–72)
Vitamin D2 1, 25 (OH)2: <8 pg/mL
Vitamin D3 1, 25 (OH)2: 65 pg/mL

## 2012-04-09 ENCOUNTER — Encounter: Payer: Self-pay | Admitting: Vascular Surgery

## 2012-04-10 ENCOUNTER — Ambulatory Visit: Payer: Federal, State, Local not specified - PPO | Admitting: Vascular Surgery

## 2012-04-12 ENCOUNTER — Telehealth: Payer: Self-pay | Admitting: Internal Medicine

## 2012-04-12 NOTE — Telephone Encounter (Signed)
Patient requesting call back with her vitamin D levels, she says she can not find it on her mychart

## 2012-04-12 NOTE — Telephone Encounter (Signed)
Have you had a chance to review and release results? Please advise.

## 2012-04-13 NOTE — Telephone Encounter (Signed)
Phone call to pt, LM letting her know lab results are normal and if she has questions she can call the office.

## 2012-04-13 NOTE — Telephone Encounter (Signed)
Letter was done before she activated MyChart. The labs were signed off and letter generated - should be in the mail. The lab results, including vitamin D were normal.

## 2012-04-16 ENCOUNTER — Telehealth: Payer: Self-pay | Admitting: *Deleted

## 2012-04-16 NOTE — Telephone Encounter (Signed)
Returned the patient's call. She compalined of pain down the back of her left leg and wondered what was wrong. She is a vv pt of Dr. Bosie Helper who never came back for her 3 mo fu visit. We were able to schedule her for tomorrow to see Dr. Arbie Cookey so he will examine her and make a plan.

## 2012-04-17 ENCOUNTER — Encounter: Payer: Self-pay | Admitting: Vascular Surgery

## 2012-04-17 ENCOUNTER — Ambulatory Visit (INDEPENDENT_AMBULATORY_CARE_PROVIDER_SITE_OTHER): Payer: Federal, State, Local not specified - PPO | Admitting: Vascular Surgery

## 2012-04-17 VITALS — BP 140/82 | HR 62 | Resp 18 | Ht 66.0 in | Wt 258.2 lb

## 2012-04-17 DIAGNOSIS — I83893 Varicose veins of bilateral lower extremities with other complications: Secondary | ICD-10-CM

## 2012-04-17 DIAGNOSIS — M79609 Pain in unspecified limb: Secondary | ICD-10-CM

## 2012-04-17 NOTE — Progress Notes (Signed)
Problems with Activities of Daily Living Secondary to Leg Pain  1. 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  2. Mrs.  Alicia [name] walks for exercise and she has had to decrease the duration and frequency of walking due to leg pain.   3. [name] states that traveling in the car is very difficult due to leg pain.   Failure of  Conservative Therapy:  1. Worn 20-30 mm Hg thigh high compression hose >3 months with no relief of symptoms.  2. Frequently elevates legs-no relief of symptoms  3. Taken Ibuprofen 600 Mg TID with no relief of symptoms.  Patient reports a new aching discomfort beginning in her posterior thigh and extending down through her calf and radiating into her second and third toe. She reports that she did have difficulty with sciatic pain in the past. She has no history of lumbar disc disease. She continues to have a separate sensation of aching with prolonged standing this is mainly in her calf and extending into her foot. The new sensation is different from this. She does not have any history of DVT.  Physical exam: 2+ dorsalis pedis pulses bilaterally. She does have swelling greater in her left leg in her right. She does have varicosities in her posterior calf as well. I reimaged these areas with the SonoSite ultrasound this does show enlarged reflux in her saphenous vein.  Impression and plan: I feel that she has several components of her discomfort. The issue of concern currently appears to be more neurologic. I explained that with a normal arterial flow and no change in her swelling her venous imaging with direct feel this is related to a venous pathology. She has no symptoms that are consistent with DVT. She will continue her current treatment. She has failed conservative treatment regarding compression and her more typical venous hypertension symptoms. She does wish to proceed with laser ablation once school  is out during the summer. We will proceed with left leg great saphenous vein laser ablation of her convenience

## 2012-05-02 ENCOUNTER — Other Ambulatory Visit: Payer: Self-pay | Admitting: *Deleted

## 2012-05-02 DIAGNOSIS — I83893 Varicose veins of bilateral lower extremities with other complications: Secondary | ICD-10-CM

## 2012-07-04 ENCOUNTER — Encounter: Payer: Self-pay | Admitting: Vascular Surgery

## 2012-07-05 ENCOUNTER — Other Ambulatory Visit: Payer: Federal, State, Local not specified - PPO | Admitting: Vascular Surgery

## 2012-07-12 ENCOUNTER — Ambulatory Visit: Payer: Federal, State, Local not specified - PPO | Admitting: Vascular Surgery

## 2012-09-07 ENCOUNTER — Encounter: Payer: Self-pay | Admitting: Internal Medicine

## 2012-09-07 ENCOUNTER — Ambulatory Visit (INDEPENDENT_AMBULATORY_CARE_PROVIDER_SITE_OTHER): Payer: Federal, State, Local not specified - PPO | Admitting: Internal Medicine

## 2012-09-07 VITALS — BP 120/72 | HR 60 | Temp 98.0°F | Wt 258.8 lb

## 2012-09-07 DIAGNOSIS — H6981 Other specified disorders of Eustachian tube, right ear: Secondary | ICD-10-CM

## 2012-09-07 DIAGNOSIS — H6980 Other specified disorders of Eustachian tube, unspecified ear: Secondary | ICD-10-CM

## 2012-09-07 DIAGNOSIS — I10 Essential (primary) hypertension: Secondary | ICD-10-CM

## 2012-09-07 DIAGNOSIS — J029 Acute pharyngitis, unspecified: Secondary | ICD-10-CM

## 2012-09-07 MED ORDER — AZITHROMYCIN 250 MG PO TABS: ORAL_TABLET | ORAL | Status: DC

## 2012-09-07 MED ORDER — PREDNISONE (PAK) 10 MG PO TABS: 10.0000 mg | ORAL_TABLET | ORAL | Status: DC

## 2012-09-07 NOTE — Progress Notes (Signed)
  Subjective:     Alicia [name] is a 58 y.o. female who presents for evaluation of sore throat. Associated symptoms include nasal blockage, post nasal drip, sinus and nasal congestion and sore throat. Onset of symptoms was 2 days ago, and have been unchanged since that time. She is drinking plenty of fluids. She has not had a recent close exposure to someone with proven streptococcal pharyngitis.  The following portions of the patient's history were reviewed and updated as appropriate: allergies, current medications, past family history, past medical history, past social history, past surgical history and problem list.  Review of Systems Pertinent items are noted in HPI.    Objective:    BP 120/72  Pulse 60  Temp(Src) 98 F (36.7 C) (Oral)  Wt 258 lb 12.8 oz (117.391 kg)  BMI 41.79 kg/m2  SpO2 96% General appearance: alert, cooperative and no distress Head: Normocephalic, without obvious abnormality, atraumatic, sinuses nontender to percussion Eyes: negative Ears: normal TM and external ear canal left ear and abnormal TM right ear - dull Nose: clear discharge Throat: abnormal findings: mild oropharyngeal erythema Neck: mild anterior cervical adenopathy, supple, symmetrical, trachea midline and thyroid not enlarged, symmetric, no tenderness/mass/nodules Lungs: clear to auscultation bilaterally Heart: regular rate and rhythm  Laboratory Strep test not done. Results:NA .    Assessment:    Acute pharyngitis, likely  Viral pharyngitis.   allergic rhinitis hypertension    Plan:    Pred taper for inflammation and eustachian tube dysfunction.   Empiric antibiotics Z-Pak provided to fill and use as needed Also encouraged antihistamine for associated allergic rhinitis

## 2012-09-07 NOTE — Assessment & Plan Note (Signed)
BP Readings from Last 3 Encounters:  09/07/12 120/72  04/17/12 140/82  04/04/12 150/92   Added ARB-HCT (Losartan/Hct) 03/2012 but did not start until 08/2012 Improved - continue same Pt will continue to monitor BP at home and report back via MyChart

## 2012-09-07 NOTE — Patient Instructions (Addendum)
It was good to see you today. We have reviewed your prior records including labs and tests today If you develop worsening symptoms or fever, call and we can reconsider antibiotics, (given written prescription to you to fill if needed) but it does not appear necessary to use antibiotics at this time. Prednisone taper over next 6 days - Your prescription(s) have been submitted to your pharmacy. Please take as directed and contact our office if you believe you are having problem(s) with the medication(s). Ok to use allergy medication such as Claritin 10mg  once daily Alternate between ibuprofen and tylenol for aches, pain and fever symptoms as discussed follow up with Dr Debby Bud in next 1-2 weeks if unimproved, call sooner if worse

## 2012-10-02 ENCOUNTER — Other Ambulatory Visit: Payer: Self-pay | Admitting: *Deleted

## 2012-10-02 MED ORDER — LOSARTAN POTASSIUM-HCTZ 50-12.5 MG PO TABS: 1.0000 | ORAL_TABLET | Freq: Every day | ORAL | Status: DC

## 2012-10-19 ENCOUNTER — Ambulatory Visit (INDEPENDENT_AMBULATORY_CARE_PROVIDER_SITE_OTHER): Payer: Federal, State, Local not specified - PPO | Admitting: Internal Medicine

## 2012-10-19 ENCOUNTER — Encounter: Payer: Self-pay | Admitting: Internal Medicine

## 2012-10-19 VITALS — BP 118/82 | HR 62 | Temp 98.2°F | Ht 66.0 in | Wt 259.4 lb

## 2012-10-19 DIAGNOSIS — I10 Essential (primary) hypertension: Secondary | ICD-10-CM

## 2012-10-19 DIAGNOSIS — J309 Allergic rhinitis, unspecified: Secondary | ICD-10-CM

## 2012-10-19 DIAGNOSIS — J069 Acute upper respiratory infection, unspecified: Secondary | ICD-10-CM | POA: Insufficient documentation

## 2012-10-19 MED ORDER — PREDNISONE 10 MG PO TABS: ORAL_TABLET | ORAL | Status: DC

## 2012-10-19 MED ORDER — FEXOFENADINE HCL 180 MG PO TABS: 180.0000 mg | ORAL_TABLET | Freq: Every day | ORAL | Status: DC

## 2012-10-19 MED ORDER — AZITHROMYCIN 250 MG PO TABS: ORAL_TABLET | ORAL | Status: DC

## 2012-10-19 MED ORDER — METHYLPREDNISOLONE ACETATE 80 MG/ML IJ SUSP
80.0000 mg | Freq: Once | INTRAMUSCULAR | Status: AC
  Administered 2012-10-19: 80 mg via INTRAMUSCULAR

## 2012-10-19 MED ORDER — FLUTICASONE PROPIONATE 50 MCG/ACT NA SUSP: 2.0000 | Freq: Every day | NASAL | Status: DC

## 2012-10-19 NOTE — Progress Notes (Signed)
  Subjective:    Patient ID: Alicia [name], female    DOB: Jun 18, 1955, 57 y.o.   MRN: [id number]  HPI  Here with 2-3 days acute onset fever, facial pain, pressure, headache, general weakness and malaise, and greenish d/c, with mild ST and cough, but pt denies chest pain, wheezing, increased sob or doe, orthopnea, PND, increased LE swelling, palpitations, dizziness or syncope.  Does have several wks ongoing nasal allergy symptoms with clearish congestion, itch and sneezing, without fever, pain, ST, cough, swelling or wheezing. Pt denies new neurological symptoms such as new headache, or facial or extremity weakness or numbness Past Medical History  Diagnosis Date  . Palpitations   . Varicose veins   . Near syncope   . Chronic tension headaches   . Nephrolithiasis     history of  . Hypertension    Past Surgical History  Procedure Laterality Date  . Abdominal hysterectomy    . Cholecystectomy    . Tonsillectomy    . Cataract surgery Right 03-02-2012    reports that she has never smoked. She has never used smokeless tobacco. She reports that she does not drink alcohol or use illicit drugs. family history includes Cancer in her mother; Heart disease in her father; Stroke in her father. Allergies  Allergen Reactions  . Contrast Media [Iodinated Diagnostic Agents]   . Iohexol     Pt.states she stopped breathing and was admitted to the ICU~10 years ago  . Morphine   . Penicillins   . Sulfamethoxazole-Trimethoprim   . Sulfonamide Derivatives   . Ciprofloxacin Itching and Rash   Review of Systems  Constitutional: Negative for unexpected weight change, or unusual diaphoresis  HENT: Negative for tinnitus.   Eyes: Negative for photophobia and visual disturbance.  Respiratory: Negative for choking and stridor.   Gastrointestinal: Negative for vomiting and blood in stool.  Genitourinary: Negative for hematuria and decreased urine volume.  Musculoskeletal: Negative for acute joint  swelling Skin: Negative for color change and wound.  Neurological: Negative for tremors and numbness other than noted  Psychiatric/Behavioral: Negative for decreased concentration or  hyperactivity.       Objective:   Physical Exam BP 118/82  Pulse 62  Temp(Src) 98.2 F (36.8 C) (Oral)  Ht 5\' 6"  (1.676 m)  Wt 259 lb 6 oz (117.652 kg)  BMI 41.88 kg/m2  SpO2 96% VS noted,  Constitutional: Pt appears well-developed and well-nourished.  HENT: Head: NCAT.  Right Ear: External ear normal.  Left Ear: External ear normal.  Bilat tm's with mild erythema.  Max sinus areas mild tender.  Pharynx with mild erythema, no exudate Eyes: Conjunctivae and EOM are normal. Pupils are equal, round, and reactive to light.  Neck: Normal range of motion. Neck supple.  Cardiovascular: Normal rate and regular rhythm.   Pulmonary/Chest: Effort normal and breath sounds normal.  Neurological: Pt is alert. Not confused  Skin: Skin is warm. No erythema.  Psychiatric: Pt behavior is normal. Thought content n    Assessment & Plan:

## 2012-10-19 NOTE — Patient Instructions (Addendum)
You had the steroid shot today Please take all new medication as prescribed - the antibiotic, allegra, prednisone (low dose), and the flonase if you want to try that Please continue all other medications as before, and refills have been done if requested. You can also take Mucinex (or it's generic off brand) for congestion, and tylenol as needed for pain. Please continue all other medications as before, and refills have been done if requested. Please have the pharmacy call with any other refills you may need.  Please remember to sign up for My Chart if you have not done so, as this will be important to you in the future with finding out test results, communicating by private email, and scheduling acute appointments online when needed.

## 2012-10-20 NOTE — Assessment & Plan Note (Signed)
With seasonal flare, for steroid short course, allegra/flonase asd,  to f/u any worsening symptoms or concerns

## 2012-10-20 NOTE — Assessment & Plan Note (Signed)
Mild to mod, for antibx course,  to f/u any worsening symptoms or concerns 

## 2012-10-20 NOTE — Assessment & Plan Note (Signed)
stable overall by history and exam, recent data reviewed with pt, and pt to continue medical treatment as before,  to f/u any worsening symptoms or concerns BP Readings from Last 3 Encounters:  10/19/12 118/82  09/07/12 120/72  04/17/12 140/82

## 2012-10-22 ENCOUNTER — Ambulatory Visit (INDEPENDENT_AMBULATORY_CARE_PROVIDER_SITE_OTHER): Payer: Federal, State, Local not specified - PPO | Admitting: Internal Medicine

## 2012-10-22 ENCOUNTER — Other Ambulatory Visit (INDEPENDENT_AMBULATORY_CARE_PROVIDER_SITE_OTHER): Payer: Federal, State, Local not specified - PPO

## 2012-10-22 ENCOUNTER — Encounter: Payer: Self-pay | Admitting: *Deleted

## 2012-10-22 ENCOUNTER — Encounter: Payer: Self-pay | Admitting: Internal Medicine

## 2012-10-22 VITALS — BP 128/78 | HR 56 | Temp 97.2°F | Wt 256.4 lb

## 2012-10-22 DIAGNOSIS — R5381 Other malaise: Secondary | ICD-10-CM

## 2012-10-22 DIAGNOSIS — R599 Enlarged lymph nodes, unspecified: Secondary | ICD-10-CM

## 2012-10-22 DIAGNOSIS — R5383 Other fatigue: Secondary | ICD-10-CM

## 2012-10-22 DIAGNOSIS — R590 Localized enlarged lymph nodes: Secondary | ICD-10-CM

## 2012-10-22 LAB — CBC WITH DIFFERENTIAL/PLATELET
Basophils Absolute: 0 10*3/uL (ref 0.0–0.1)
Basophils Relative: 0.4 % (ref 0.0–3.0)
Eosinophils Absolute: 0.2 10*3/uL (ref 0.0–0.7)
Eosinophils Relative: 3.3 % (ref 0.0–5.0)
HCT: 40.1 % (ref 36.0–46.0)
Hemoglobin: 13.6 g/dL (ref 12.0–15.0)
Lymphocytes Relative: 22.8 % (ref 12.0–46.0)
Lymphs Abs: 1.4 10*3/uL (ref 0.7–4.0)
MCHC: 34 g/dL (ref 30.0–36.0)
MCV: 91.6 fl (ref 78.0–100.0)
Monocytes Absolute: 0.6 10*3/uL (ref 0.1–1.0)
Monocytes Relative: 9.6 % (ref 3.0–12.0)
Neutro Abs: 4 10*3/uL (ref 1.4–7.7)
Neutrophils Relative %: 63.9 % (ref 43.0–77.0)
Platelets: 213 10*3/uL (ref 150.0–400.0)
RBC: 4.38 Mil/uL (ref 3.87–5.11)
RDW: 12.6 % (ref 11.5–14.6)
WBC: 6.3 10*3/uL (ref 4.5–10.5)

## 2012-10-22 LAB — TSH: TSH: 2.73 u[IU]/mL (ref 0.35–5.50)

## 2012-10-22 NOTE — Progress Notes (Signed)
  Subjective:    Patient ID: Alicia [name], female    DOB: February 25, 1955, 57 y.o.   MRN: [id number]  HPI  Patient here today for follow up of fatigue and throat fullness.  She was seen on 10/3 by Dr Jonny Ruiz for acute respiratory infection and was treated with steroid injection and allegra.  She was given RX for Zpak at that visit, but did not have it filled.  She reports no improvement with interventions.  Since then, she reports that her throat fullness has worsened.  She has had significant fatigue for the past month.  She denies post-nasal drip, pharyngitis, cough, fevers, chills, shortness of breath, rashes, joint swelling, wheezing or other symptoms.   Past Medical History  Diagnosis Date  . Palpitations   . Varicose veins   . Near syncope   . Chronic tension headaches   . Nephrolithiasis     history of  . Hypertension     Review of Systems As per HPI    Objective:   Physical Exam  Constitutional: She is oriented to person, place, and time. She appears well-developed and well-nourished. No distress.  HENT:  Head: Normocephalic and atraumatic.  Right Ear: External ear normal.  Left Ear: External ear normal.  Nose: Nose normal.  Mouth/Throat: Oropharynx is clear and moist. No oropharyngeal exudate.  Eyes: Conjunctivae are normal. Right eye exhibits no discharge. Left eye exhibits no discharge. No scleral icterus.  Neck: Normal range of motion. Neck supple. No thyromegaly present.  Cardiovascular: Normal rate and regular rhythm.   No murmur heard. Pulmonary/Chest: Effort normal and breath sounds normal. No respiratory distress. She has no wheezes.  Abdominal: Soft. Bowel sounds are normal. She exhibits no distension. There is tenderness (bilateral flank tenderness).  Lymphadenopathy:    She has cervical adenopathy.  Neurological: She is alert and oriented to person, place, and time.  Skin: Skin is warm and dry. No rash noted.  Psychiatric: She has a normal mood and affect. Her  behavior is normal. Judgment and thought content normal.   Lab Results  Component Value Date   WBC 5.6 06/08/2011   HGB 14.6 11/16/2011   HCT 43.0 11/16/2011   PLT 206.0 06/08/2011   GLUCOSE 96 04/04/2012   CHOL 200 04/04/2012   TRIG 121.0 04/04/2012   HDL 65.50 04/04/2012   LDLCALC 110* 04/04/2012   ALT 23 04/04/2012   AST 21 04/04/2012   NA 143 04/04/2012   K 4.6 04/04/2012   CL 105 04/04/2012   CREATININE 0.7 04/04/2012   BUN 17 04/04/2012   CO2 30 04/04/2012   TSH 2.24 06/08/2011   INR 1.0 ratio 04/03/2009       Assessment & Plan:   1.  Fatigue - no specific signs or symptoms to suggest problem. ongoing for the past month.  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  Will send Alicia [name] panel, TSH, CBC. No treatment changes recommended at this time  2.  Cervical lymphadenopathy -"fullness" sensation anterior neck without specific mass appreciated. no associated fevers, chills, night sweats, or other symptoms.  Continue Allegra.  If lab work unrevealing, will order ultrasound of neck.

## 2012-10-22 NOTE — Assessment & Plan Note (Signed)
Wt Readings from Last 3 Encounters:  10/22/12 256 lb 6.4 oz (116.302 kg)  10/19/12 259 lb 6 oz (117.652 kg)  09/07/12 258 lb 12.8 oz (117.391 kg)  Thick neck precludes good physical exam, but no gross mass or tenderness (see above) The patient is asked to make an attempt to improve diet and exercise patterns to aid in medical management of this problem.

## 2012-10-22 NOTE — Patient Instructions (Signed)
It was good to see you today. We have reviewed your prior records including labs and tests today Medications reviewed and updated, no changes recommended at this time. Test(s) ordered today. Your results will be released to MyChart (or called to you) after review, usually within 72hours after test completion. If any changes need to be made, you will be notified at that same time. If no abnormality clearly identified on lab testing, we'll consider neck ultrasound as discussed. Please call if symptoms unimproved in next 3-4 weeks, sooner if worse

## 2012-10-23 ENCOUNTER — Telehealth: Payer: Self-pay | Admitting: *Deleted

## 2012-10-23 LAB — EPSTEIN-BARR VIRUS VCA ANTIBODY PANEL
EBV EA IgG: <5 U/mL (ref <9.0)
EBV NA IgG: 396 U/mL — ABNORMAL HIGH (ref <18.0)
EBV VCA IgG: 24.6 U/mL — ABNORMAL HIGH (ref <18.0)
EBV VCA IgM: <10 U/mL (ref <36.0)

## 2012-10-23 NOTE — Telephone Encounter (Signed)
[facility] Triage Call Report Triage Record Num: [id number] Operator: Aundra Millet Patient Name: Alicia [name] Call Date & Time: 10/22/2012 6:43:08AM Patient Phone: [phone] PCP: Illene Regulus Patient Gender: Female PCP Fax : [phone] Patient DOB: [birth year] [facility] Name[facility] Reason for Call: Caller: [name]; PCP: Illene Regulus; CB#: [phone]; Call regarding Neck Swelling; Today, 10/22/2012, pt calling with swelling in front on neck that she started noticing last night. She is able to swallow, but feels restricted. No trouble breathing. She had OV ~ 10/19/2012 for lymph node swelling and given Prednisone injection and Allegra , but not helping. Pt has had swollen neck LN in the past and seen by ENT, Pt comments she has had history decreased immunity . RN reached See in 24 hours for new onset localized swelling of lymph nodes in high risk individual per Neck swelling protocol .No morning appts with Dr. Debby Bud and scheduled at 0845 with Dr. Felicity Coyer Protocol(s) Used: Neck Lump or Swelling Recommended Outcome per Protocol: See Provider within 24 hours Reason for Outcome: New onset of localized or generalized swelling of lymph nodes in a high risk individual Care Advice: ~ Call provider if symptoms worsen or new symptoms develop. ~ SYMPTOM / CONDITION MANAGEMENT 10/

## 2012-10-24 ENCOUNTER — Telehealth: Payer: Self-pay | Admitting: *Deleted

## 2012-10-24 DIAGNOSIS — R221 Localized swelling, mass and lump, neck: Secondary | ICD-10-CM

## 2012-10-24 NOTE — Telephone Encounter (Signed)
[facility] Triage Call Report Triage Record Num: [id number] Operator: Gypsy Decant Patient Name: Alicia [name] Call Date & Time: 10/23/2012 7:35:14PM Patient Phone: [phone] PCP: Illene Regulus Patient Gender: Female PCP Fax : [phone] Patient DOB: 07/28/1955 [facility] Name[facility] Reason for Call: Caller: [name]/Patient; PCP: Illene Regulus (Adults only); CB#: [phone]; Call regarding Concerns About Lab Results; Patient is still not feeling well, she is still having the swollen lymph nodes and she is going to call in the AM and schedule the U/S. She has no new s/sx she is concerned that she may have HIV, Burkitt's lymphoma, and/or nasopharyngeal carcinoma. Advised pt that these are comments that are placed on every lab result as reference for the MD. Also, assured pt that if she had any of the above mentioned illnesses we would not send her a message on her "mychart." Pt was very grateful and states she is not feeling any worse, she was having issues with swallowing yesterday, however, that is better today. She has no fever and has had no fever. She will call the office first thing in the AM. Protocol(s) Used: Office Note Recommended Outcome per Protocol: Information Noted and Sent to Office Reason for Outcome: Caller information to office Care Advice: ~ 10/

## 2012-10-24 NOTE — Telephone Encounter (Signed)
Pt called states she has questions about her lab results.  I called pt left message on VM requesting her to call back.

## 2012-10-24 NOTE — Telephone Encounter (Signed)
Pt called requesting [place] order.  Please advise

## 2012-10-24 NOTE — Telephone Encounter (Signed)
Soft tissue ultrasound of neck has been ordered. [facility] will call regarding same

## 2012-10-25 NOTE — Telephone Encounter (Signed)
Spoke with pt advised [place] has been ordered.

## 2012-10-26 ENCOUNTER — Ambulatory Visit
Admission: RE | Admit: 2012-10-26 | Discharge: 2012-10-26 | Disposition: A | Discharge status: Home / Self Care | Payer: Federal, State, Local not specified - PPO | Source: Ambulatory Visit | Attending: Internal Medicine | Admitting: Internal Medicine

## 2012-10-26 DIAGNOSIS — R221 Localized swelling, mass and lump, neck: Secondary | ICD-10-CM

## 2012-10-27 ENCOUNTER — Ambulatory Visit: Payer: Self-pay | Admitting: Family Medicine

## 2012-10-29 ENCOUNTER — Telehealth: Payer: Self-pay | Admitting: *Deleted

## 2012-10-29 NOTE — Telephone Encounter (Signed)
Pt request appt on 10/31/12 with Dr. Debby Bud (car difficulty).

## 2012-10-29 NOTE — Telephone Encounter (Signed)
May add on to tomorrow's schedule

## 2012-10-29 NOTE — Telephone Encounter (Signed)
[facility] Triage Call Report Triage Record Num: [id number] Operator: Maryfrances Bunnell Patient Name: Alicia [name] Call Date & Time: 10/27/2012 11:57:45AM Patient Phone: [phone] PCP: Illene Regulus Patient Gender: Female PCP Fax : [phone] Patient DOB: [birth year] [facility] Name: [facility] Reason for Call: Caller: [name]/Patient; PCP Norins,Michael; Call back #[phone]; Call re: Increased fatigue, swollen glands: Feeling sick now for a couple of weeks; Seen in office 10/24/12 with blood work done that showed a history of Mono; Swollen glands now for several weeks, slight increase since 10/10 pm; No redness but right side of neck is painful; Has just been feeling very tired and sick like; Temp orally last night was 94.7; 96-97 this am; Extremely tired and sick feeling today; No uri s/s; Per Swollen Lymph Nodes Protocol "Localize tender, swollen glands that persist or are unimproved after 14 days." Disposition is to see within 72 hours; Appt scheduled for 10/27/12 1230; Care advice given. Protocol(s) Used: Swollen Lymph Nodes Recommended Outcome per Protocol: See Provider within 72 Hours Reason for Outcome: Localized tender, swollen glands (lymph nodes) that persist or are unimproved after 14 days Care Advice: ~ Call provider if symptoms worsen or new symptoms develop. ~ SYMPTOM / CONDITION MANAGEMENT 10/

## 2012-10-30 ENCOUNTER — Ambulatory Visit: Payer: Federal, State, Local not specified - PPO | Admitting: Internal Medicine

## 2012-10-31 ENCOUNTER — Encounter: Payer: Self-pay | Admitting: Internal Medicine

## 2012-10-31 ENCOUNTER — Ambulatory Visit (INDEPENDENT_AMBULATORY_CARE_PROVIDER_SITE_OTHER): Payer: Federal, State, Local not specified - PPO | Admitting: Internal Medicine

## 2012-10-31 VITALS — BP 120/78 | HR 56 | Temp 98.3°F | Wt 257.4 lb

## 2012-10-31 DIAGNOSIS — R220 Localized swelling, mass and lump, head: Secondary | ICD-10-CM

## 2012-11-01 ENCOUNTER — Ambulatory Visit: Payer: Federal, State, Local not specified - PPO | Admitting: Internal Medicine

## 2012-11-01 NOTE — Progress Notes (Signed)
  Subjective:    Patient ID: Alicia [name], female    DOB: 09-Jun-1955, 57 y.o.   MRN: [id number]  HPI Alicia [name] presents for evaluation of intermittent swelling in the neck. This has been a problem since 2011. She was recently seen by Dr. Jonny [name] Oct 3rd for URI/OM symptoms and prescribe Z-pak which she did not fill. She saw Dr. Felicity [name] Oct 6th for neck swelling. Her exam was unremarkable, labs were normal including CBC and U/S soft-tissue neck was normal w/o adenopathy. Today she is feeling ok and the neck swelling is resolved. Her concern is the pattern of recurrent swelling and associated malaise and fatigue.   Past Medical History  Diagnosis Date  . Palpitations   . Varicose veins   . Near syncope   . Chronic tension headaches   . Nephrolithiasis     history of  . Hypertension    Past Surgical History  Procedure Laterality Date  . Abdominal hysterectomy    . Cholecystectomy    . Tonsillectomy    . Cataract surgery Right 03-02-2012   Family History  Problem Relation Age of Onset  . Cancer Mother     CERVICAL  . Heart disease Father   . Stroke Father    History   Social History  . Marital Status: Married    Spouse Name: N/A    Number of Children: 3  . Years of Education: 16   Occupational History  . teacher [employer]   Social History Main Topics  . Smoking status: Never Smoker   . Smokeless tobacco: Never Used  . Alcohol Use: No  . Drug Use: No  . Sexual Activity: Yes    Partners: Male   Other Topics Concern  . Not on file   Social History Narrative   Alicia [name] state- BA. Married '79. 2 daughters- '82, '93, 1 son- '87. 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 SO- good health    Current Outpatient Prescriptions on File Prior to Visit  Medication Sig Dispense Refill  . losartan-hydrochlorothiazide (HYZAAR) 50-12.5 MG per tablet Take 1 tablet by mouth daily.  30 tablet  6   No current facility-administered medications on file prior to visit.      Review  of Systems System review is negative for any constitutional, cardiac, pulmonary, GI or neuro symptoms or complaints other than as described in the HPI.     Objective:   Physical Exam Filed Vitals:   10/31/12 1628  BP: 120/78  Pulse: 56  Temp: 98.3 F (36.8 C)   Wt Readings from Last 3 Encounters:  10/31/12 257 lb 6.4 oz (116.756 kg)  10/22/12 256 lb 6.4 oz (116.302 kg)  10/19/12 259 lb 6 oz (117.652 kg)   Gen'l Overweight woman in no acute distress HEENT- C&S clear, PERRLA Neck - obese, no palpable thyromegaly, no palpable mass, normal sized parotid glands Nodes - no palpable cervical, submandibular nodes. Cor - RRR Pulm - normal respirations. Neuro - Awake and alert, speech clear, cognition normal, normal gait.        Assessment & Plan:

## 2012-11-01 NOTE — Assessment & Plan Note (Signed)
Two year h/o intermittent neck swelling. Imaging studies have been unrevealing except for MRI with some symmetric thickening tonsils. Lab studies have been normal including RF and ANA. She is reassured that this history is not consistent with cancer, i.e. Lymphoma nor is there indication of bacterial infection. Here EBV panel with elevated IgG but normal IgM argues against acute disease.  Plan  referral to rheumatology to r/o immunologic etiology of her symptoms.

## 2012-11-22 ENCOUNTER — Other Ambulatory Visit: Payer: Self-pay

## 2013-01-22 ENCOUNTER — Telehealth: Payer: Self-pay | Admitting: Internal Medicine

## 2013-01-22 ENCOUNTER — Encounter: Payer: Self-pay | Admitting: Internal Medicine

## 2013-01-22 ENCOUNTER — Ambulatory Visit (INDEPENDENT_AMBULATORY_CARE_PROVIDER_SITE_OTHER)
Admission: RE | Admit: 2013-01-22 | Discharge: 2013-01-22 | Disposition: A | Discharge status: Home / Self Care | Payer: Federal, State, Local not specified - PPO | Source: Ambulatory Visit | Attending: Internal Medicine | Admitting: Internal Medicine

## 2013-01-22 ENCOUNTER — Ambulatory Visit (INDEPENDENT_AMBULATORY_CARE_PROVIDER_SITE_OTHER): Payer: Federal, State, Local not specified - PPO | Admitting: Internal Medicine

## 2013-01-22 VITALS — BP 126/82 | HR 58 | Temp 98.1°F | Resp 16 | Ht 66.0 in

## 2013-01-22 DIAGNOSIS — R059 Cough, unspecified: Secondary | ICD-10-CM | POA: Insufficient documentation

## 2013-01-22 DIAGNOSIS — R05 Cough: Secondary | ICD-10-CM

## 2013-01-22 DIAGNOSIS — J45901 Unspecified asthma with (acute) exacerbation: Secondary | ICD-10-CM

## 2013-01-22 MED ORDER — IPRATROPIUM-ALBUTEROL 20-100 MCG/ACT IN AERS: 1.0000 | INHALATION_SPRAY | Freq: Four times a day (QID) | RESPIRATORY_TRACT | Status: DC

## 2013-01-22 MED ORDER — AZITHROMYCIN 500 MG PO TABS: 500.0000 mg | ORAL_TABLET | Freq: Every day | ORAL | Status: DC

## 2013-01-22 MED ORDER — HYDROCODONE-HOMATROPINE 5-1.5 MG/5ML PO SYRP: 5.0000 mL | ORAL_SOLUTION | Freq: Three times a day (TID) | ORAL | Status: DC | PRN

## 2013-01-22 NOTE — Progress Notes (Signed)
   Subjective:    Patient ID: Alicia [name], female    DOB: March 03, 1955, 58 y.o.   MRN: [id number]  Cough This is a new problem. The current episode started 1 to 4 weeks ago. The problem has been gradually worsening. The problem occurs every few hours. The cough is productive of purulent sputum. Associated symptoms include chills, myalgias and wheezing. Pertinent negatives include no chest pain, ear congestion, ear pain, fever, headaches, heartburn, hemoptysis, nasal congestion, postnasal drip, rash, rhinorrhea, sore throat, shortness of breath, sweats or weight loss. She has tried OTC cough suppressant for the symptoms. The treatment provided no relief. There is no history of asthma, bronchiectasis, bronchitis, COPD, emphysema, environmental allergies or pneumonia.      Review of Systems  Constitutional: Positive for chills. Negative for fever, weight loss, diaphoresis, activity change, appetite change and unexpected weight change.  HENT: Negative.  Negative for ear pain, postnasal drip, rhinorrhea, sinus pressure, sore throat, tinnitus, trouble swallowing and voice change.   Eyes: Negative.   Respiratory: Positive for cough and wheezing. Negative for apnea, hemoptysis, choking, chest tightness, shortness of breath and stridor.   Cardiovascular: Negative.  Negative for chest pain, palpitations and leg swelling.  Gastrointestinal: Negative.  Negative for heartburn, nausea, vomiting, abdominal pain, diarrhea, constipation and blood in stool.  Endocrine: Negative.   Genitourinary: Negative.   Musculoskeletal: Positive for myalgias. Negative for back pain, gait problem, joint swelling, neck pain and neck stiffness.  Skin: Negative.  Negative for rash.  Allergic/Immunologic: Negative.  Negative for environmental allergies.  Neurological: Negative.  Negative for dizziness, tremors, speech difficulty, weakness, light-headedness, numbness and headaches.  Hematological: Negative.  Negative for  adenopathy. Does not bruise/bleed easily.  Psychiatric/Behavioral: Negative.        Objective:   Physical Exam  Vitals reviewed. Constitutional: She is oriented to person, place, and time. She appears well-developed and well-nourished.  Non-toxic appearance. She does not have a sickly appearance. She does not appear ill. No distress.  HENT:  Head: Normocephalic and atraumatic.  Mouth/Throat: Oropharynx is clear and moist. No oropharyngeal exudate.  Eyes: Conjunctivae are normal. Right eye exhibits no discharge. Left eye exhibits no discharge. No scleral icterus.  Neck: Normal range of motion. Neck supple. No JVD present. No tracheal deviation present. No thyromegaly present.  Cardiovascular: Normal rate, regular rhythm, normal heart sounds and intact distal pulses.  Exam reveals no gallop and no friction rub.   No murmur heard. Pulmonary/Chest: Effort normal. No stridor. No respiratory distress. She has no decreased breath sounds. She has no wheezes. She has rhonchi in the right upper field, the right middle field, the left upper field and the left middle field. She has no rales. She exhibits no tenderness.  Abdominal: Soft. Bowel sounds are normal. She exhibits no distension and no mass. There is no tenderness. There is no rebound and no guarding.  Musculoskeletal: Normal range of motion. She exhibits no edema and no tenderness.  Lymphadenopathy:    She has no cervical adenopathy.  Neurological: She is oriented to person, place, and time.  Skin: Skin is warm and dry. No rash noted. She is not diaphoretic. No erythema. No pallor.  Psychiatric: She has a normal mood and affect. Her behavior is normal. Judgment and thought content normal.          Assessment & Plan:

## 2013-01-22 NOTE — Assessment & Plan Note (Signed)
I have asked her to start using combivent for the wheezing and rhonchi Will treat the infection with Zpak and she will take a cough suppressant

## 2013-01-22 NOTE — Telephone Encounter (Signed)
Patient is being seen by another physician

## 2013-01-22 NOTE — Patient Instructions (Signed)
Acute Bronchitis Bronchitis is inflammation of the airways that extend from the windpipe into the lungs (bronchi). The inflammation often causes mucus to develop. This leads to a cough, which is the most common symptom of bronchitis.  In acute bronchitis, the condition usually develops suddenly and goes away over time, usually in a couple weeks. Smoking, allergies, and asthma can make bronchitis worse. Repeated episodes of bronchitis may cause further lung problems.  CAUSES Acute bronchitis is most often caused by the same virus that causes a cold. The virus can spread from person to person (contagious).  SIGNS AND SYMPTOMS   Cough.   Fever.   Coughing up mucus.   Body aches.   Chest congestion.   Chills.   Shortness of breath.   Sore throat.  DIAGNOSIS  Acute bronchitis is usually diagnosed through a physical exam. Tests, such as chest X-rays, are sometimes done to rule out other conditions.  TREATMENT  Acute bronchitis usually goes away in a couple weeks. Often times, no medical treatment is necessary. Medicines are sometimes given for relief of fever or cough. Antibiotics are usually not needed but may be prescribed in certain situations. In some cases, an inhaler may be recommended to help reduce shortness of breath and control the cough. A cool mist vaporizer may also be used to help thin bronchial secretions and make it easier to clear the chest.  HOME CARE INSTRUCTIONS  Get plenty of rest.   Drink enough fluids to keep your urine clear or pale yellow (unless you have a medical condition that requires fluid restriction). Increasing fluids may help thin your secretions and will prevent dehydration.   Only take over-the-counter or prescription medicines as directed by your health care provider.   Avoid smoking and secondhand smoke. Exposure to cigarette smoke or irritating chemicals will make bronchitis worse. If you are a smoker, consider using nicotine gum or skin  patches to help control withdrawal symptoms. Quitting smoking will help your lungs heal faster.   Reduce the chances of another bout of acute bronchitis by washing your hands frequently, avoiding people with cold symptoms, and trying not to touch your hands to your mouth, nose, or eyes.   Follow up with your health care provider as directed.  SEEK MEDICAL CARE IF: Your symptoms do not improve after 1 week of treatment.  SEEK IMMEDIATE MEDICAL CARE IF:  You develop an increased fever or chills.   You have chest pain.   You have severe shortness of breath.  You have bloody sputum.   You develop dehydration.  You develop fainting.  You develop repeated vomiting.  You develop a severe headache. MAKE SURE YOU:   Understand these instructions.  Will watch your condition.  Will get help right away if you are not doing well or get worse. Document Released: 02/11/2004 Document Revised: 09/05/2012 Document Reviewed: 06/26/2012 [employer] Patient Information 2014 [employer].  

## 2013-01-22 NOTE — Telephone Encounter (Signed)
Pt would like to be worked in for congestion and a cough.

## 2013-01-22 NOTE — Progress Notes (Signed)
Pre visit review using our clinic review tool, if applicable. No additional management support is needed unless otherwise documented below in the visit note. 

## 2013-01-22 NOTE — Assessment & Plan Note (Signed)
I will check her CXR to see if she has PNA 

## 2013-02-13 ENCOUNTER — Emergency Department (HOSPITAL_COMMUNITY): Payer: Federal, State, Local not specified - PPO

## 2013-02-13 ENCOUNTER — Encounter (HOSPITAL_COMMUNITY): Payer: Self-pay | Admitting: Emergency Medicine

## 2013-02-13 ENCOUNTER — Emergency Department (HOSPITAL_COMMUNITY)
Admission: EM | Admit: 2013-02-13 | Discharge: 2013-02-13 | Disposition: A | Discharge status: Home / Self Care | Payer: Federal, State, Local not specified - PPO | Attending: Emergency Medicine | Admitting: Emergency Medicine

## 2013-02-13 DIAGNOSIS — I10 Essential (primary) hypertension: Secondary | ICD-10-CM | POA: Insufficient documentation

## 2013-02-13 DIAGNOSIS — R197 Diarrhea, unspecified: Secondary | ICD-10-CM | POA: Insufficient documentation

## 2013-02-13 DIAGNOSIS — R6889 Other general symptoms and signs: Secondary | ICD-10-CM

## 2013-02-13 DIAGNOSIS — J111 Influenza due to unidentified influenza virus with other respiratory manifestations: Secondary | ICD-10-CM | POA: Insufficient documentation

## 2013-02-13 DIAGNOSIS — Z87442 Personal history of urinary calculi: Secondary | ICD-10-CM | POA: Insufficient documentation

## 2013-02-13 DIAGNOSIS — Z79899 Other long term (current) drug therapy: Secondary | ICD-10-CM | POA: Insufficient documentation

## 2013-02-13 DIAGNOSIS — Z880 Allergy status to penicillin: Secondary | ICD-10-CM | POA: Insufficient documentation

## 2013-02-13 DIAGNOSIS — R630 Anorexia: Secondary | ICD-10-CM | POA: Insufficient documentation

## 2013-02-13 DIAGNOSIS — J9801 Acute bronchospasm: Secondary | ICD-10-CM | POA: Insufficient documentation

## 2013-02-13 DIAGNOSIS — R110 Nausea: Secondary | ICD-10-CM | POA: Insufficient documentation

## 2013-02-13 DIAGNOSIS — Z8669 Personal history of other diseases of the nervous system and sense organs: Secondary | ICD-10-CM | POA: Insufficient documentation

## 2013-02-13 MED ORDER — ONDANSETRON HCL 4 MG PO TABS: 4.0000 mg | ORAL_TABLET | Freq: Four times a day (QID) | ORAL | Status: DC

## 2013-02-13 MED ORDER — ONDANSETRON 4 MG PO TBDP
4.0000 mg | ORAL_TABLET | Freq: Once | ORAL | Status: AC
  Administered 2013-02-13: 4 mg via ORAL
  Filled 2013-02-13: qty 1

## 2013-02-13 MED ORDER — ALBUTEROL SULFATE HFA 108 (90 BASE) MCG/ACT IN AERS
2.0000 | INHALATION_SPRAY | Freq: Once | RESPIRATORY_TRACT | Status: AC
  Administered 2013-02-13: 2 via RESPIRATORY_TRACT
  Filled 2013-02-13: qty 6.7

## 2013-02-13 MED ORDER — ALBUTEROL SULFATE (2.5 MG/3ML) 0.083% IN NEBU
5.0000 mg | INHALATION_SOLUTION | Freq: Once | RESPIRATORY_TRACT | Status: AC
  Administered 2013-02-13: 5 mg via RESPIRATORY_TRACT
  Filled 2013-02-13: qty 6

## 2013-02-13 NOTE — ED Notes (Signed)
Pt reports that she has been having generalized body aches, stomach cramps, nausea, and a headache x 2days. Pt also reports diarrhea.

## 2013-02-13 NOTE — Discharge Instructions (Signed)
Rest, stay well-hydrated, use inhaler every 4-6 hours as needed.  Bronchospasm, Adult A bronchospasm is a spasm or tightening of the airways going into the lungs. During a bronchospasm breathing becomes more difficult because the airways get smaller. When this happens there can be coughing, a whistling sound when breathing (wheezing), and difficulty breathing. Bronchospasm is often associated with asthma, but not all patients who experience a bronchospasm have asthma. CAUSES  A bronchospasm is caused by inflammation or irritation of the airways. The inflammation or irritation may be triggered by:   Allergies (such as to animals, pollen, food, or mold). Allergens that cause bronchospasm may cause wheezing immediately after exposure or many hours later.   Infection. Viral infections are believed to be the most common cause of bronchospasm.   Exercise.   Irritants (such as pollution, cigarette smoke, strong odors, aerosol sprays, and paint fumes).   Weather changes. Winds increase molds and pollens in the air. Rain refreshes the air by washing irritants out. Cold air may cause inflammation.   Stress and emotional upset.  SIGNS AND SYMPTOMS   Wheezing.   Excessive nighttime coughing.   Frequent or severe coughing with a simple cold.   Chest tightness.   Shortness of breath.  DIAGNOSIS  Bronchospasm is usually diagnosed through a history and physical exam. Tests, such as chest X-rays, are sometimes done to look for other conditions. TREATMENT   Inhaled medicines can be given to open up your airways and help you breathe. The medicines can be given using either an inhaler or a nebulizer machine.  Corticosteroid medicines may be given for severe bronchospasm, usually when it is associated with asthma. HOME CARE INSTRUCTIONS   Always have a plan prepared for seeking medical care. Know when to call your health care provider and local emergency services (911 in the [place] Know  where you can access local emergency care.  Only take medicines as directed by your health care provider.  If you were prescribed an inhaler or nebulizer machine, ask your health care provider to explain how to use it correctly. Always use a spacer with your inhaler if you were given one.  It is necessary to remain calm during an attack. Try to relax and breathe more slowly.  Control your home environment in the following ways:   Change your heating and air conditioning filter at least once a month.   Limit your use of fireplaces and wood stoves.  Do not smoke and do not allow smoking in your home.   Avoid exposure to perfumes and fragrances.   Get rid of pests (such as roaches and mice) and their droppings.   Throw away plants if you see mold on them.   Keep your house clean and dust free.   Replace carpet with wood, tile, or vinyl flooring. Carpet can trap dander and dust.   Use allergy-proof pillows, mattress covers, and box spring covers.   Wash bed sheets and blankets every week in hot water and dry them in a dryer.   Use blankets that are made of polyester or cotton.   Wash hands frequently. SEEK MEDICAL CARE IF:   You have muscle aches.   You have chest pain.   The sputum changes from clear or white to yellow, green, gray, or bloody.   The sputum you cough up gets thicker.   There are problems that may be related to the medicine you are given, such as a rash, itching, swelling, or trouble breathing.  SEEK IMMEDIATE  MEDICAL CARE IF:   You have worsening wheezing and coughing even after taking your prescribed medicines.   You have increased difficulty breathing.   You develop severe chest pain. MAKE SURE YOU:   Understand these instructions.  Will watch your condition.  Will get help right away if you are not doing well or get worse. Document Released: 01/06/2003 Document Revised: 09/05/2012 Document Reviewed: 06/25/2012 [facility]  Patient Information 2014 [place].  Influenza, Adult Influenza ("the flu") is a viral infection of the respiratory tract. It occurs more often in winter months because people spend more time in close contact with one another. Influenza can make you feel very sick. Influenza easily spreads from person to person (contagious). CAUSES  Influenza is caused by a virus that infects the respiratory tract. You can catch the virus by breathing in droplets from an infected person's cough or sneeze. You can also catch the virus by touching something that was recently contaminated with the virus and then touching your mouth, nose, or eyes. SYMPTOMS  Symptoms typically last 4 to 10 days and may include:  Fever.  Chills.  Headache, body aches, and muscle aches.  Sore throat.  Chest discomfort and cough.  Poor appetite.  Weakness or feeling tired.  Dizziness.  Nausea or vomiting. DIAGNOSIS  Diagnosis of influenza is often made based on your history and a physical exam. A nose or throat swab test can be done to confirm the diagnosis. RISKS AND COMPLICATIONS You may be at risk for a more severe case of influenza if you smoke cigarettes, have diabetes, have chronic heart disease (such as heart failure) or lung disease (such as asthma), or if you have a weakened immune system. Elderly people and pregnant women are also at risk for more serious infections. The most common complication of influenza is a lung infection (pneumonia). Sometimes, this complication can require emergency medical care and may be life-threatening. PREVENTION  An annual influenza vaccination (flu shot) is the best way to avoid getting influenza. An annual flu shot is now routinely recommended for all adults in the [place] TREATMENT  In mild cases, influenza goes away on its own. Treatment is directed at relieving symptoms. For more severe cases, your caregiver may prescribe antiviral medicines to shorten the sickness. Antibiotic  medicines are not effective, because the infection is caused by a virus, not by bacteria. HOME CARE INSTRUCTIONS  Only take over-the-counter or prescription medicines for pain, discomfort, or fever as directed by your caregiver.  Use a cool mist humidifier to make breathing easier.  Get plenty of rest until your temperature returns to normal. This usually takes 3 to 4 days.  Drink enough fluids to keep your urine clear or pale yellow.  Cover your mouth and nose when coughing or sneezing, and wash your hands well to avoid spreading the virus.  Stay home from work or school until your fever has been gone for at least 1 full day. SEEK MEDICAL CARE IF:   You have chest pain or a deep cough that worsens or produces more mucus.  You have nausea, vomiting, or diarrhea. SEEK IMMEDIATE MEDICAL CARE IF:   You have difficulty breathing, shortness of breath, or your skin or nails turn bluish.  You have severe neck pain or stiffness.  You have a severe headache, facial pain, or earache.  You have a worsening or recurring fever.  You have nausea or vomiting that cannot be controlled. MAKE SURE YOU:  Understand these instructions.  Will watch your  condition.  Will get help right away if you are not doing well or get worse. Document Released: 01/01/2000 Document Revised: 07/05/2011 Document Reviewed: 04/04/2011 [facility] Patient Information 2014 [place], [place].  Viral Infections A viral infection can be caused by different types of viruses.Most viral infections are not serious and resolve on their own. However, some infections may cause severe symptoms and may lead to further complications. SYMPTOMS Viruses can frequently cause:  Minor sore throat.  Aches and pains.  Headaches.  Runny nose.  Different types of rashes.  Watery eyes.  Tiredness.  Cough.  Loss of appetite.  Gastrointestinal infections, resulting in nausea, vomiting, and diarrhea. These symptoms do not  respond to antibiotics because the infection is not caused by bacteria. However, you might catch a bacterial infection following the viral infection. This is sometimes called a "superinfection." Symptoms of such a bacterial infection may include:  Worsening sore throat with pus and difficulty swallowing.  Swollen neck glands.  Chills and a high or persistent fever.  Severe headache.  Tenderness over the sinuses.  Persistent overall ill feeling (malaise), muscle aches, and tiredness (fatigue).  Persistent cough.  Yellow, green, or brown mucus production with coughing. HOME CARE INSTRUCTIONS   Only take over-the-counter or prescription medicines for pain, discomfort, diarrhea, or fever as directed by your caregiver.  Drink enough water and fluids to keep your urine clear or pale yellow. Sports drinks can provide valuable electrolytes, sugars, and hydration.  Get plenty of rest and maintain proper nutrition. Soups and broths with crackers or rice are fine. SEEK IMMEDIATE MEDICAL CARE IF:   You have severe headaches, shortness of breath, chest pain, neck pain, or an unusual rash.  You have uncontrolled vomiting, diarrhea, or you are unable to keep down fluids.  You or your child has an oral temperature above 102 F (38.9 C), not controlled by medicine.  Your baby is older than 3 months with a rectal temperature of 102 F (38.9 C) or higher.  Your baby is 6 months old or younger with a rectal temperature of 100.4 F (38 C) or higher. MAKE SURE YOU:   Understand these instructions.  Will watch your condition.  Will get help right away if you are not doing well or get worse. Document Released: 10/13/2004 Document Revised: 03/28/2011 Document Reviewed: 05/10/2010 [facility] Patient Information 2014 [place], [place].

## 2013-02-13 NOTE — ED Provider Notes (Signed)
CSN: [id number]     Arrival date & time 02/13/13  0533 History   First MD Initiated Contact with Patient 02/13/13 [id number]     Chief Complaint  Patient presents with  . Generalized Body Aches  . Nausea   (Consider location/radiation/quality/duration/timing/severity/associated sxs/prior Treatment) HPI Comments: Patient is a 58 year old female who presents to the emergency department complaining of generalized body aches, nausea, congestion, diarrhea and subjective intermittent fevers x2 days. Patient states earlier in the month she was seen by her PCP and diagnosed with bronchitis, was put on an antibiotic for 3 days which she does not know the name of, felt better, however her cough has remained. Cough is occasionally productive with mucus. States she is having some stomach cramping. She has tried taking ibuprofen with minimal relief. She has a decreased appetite. 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  The history is provided by the patient.    Past Medical History  Diagnosis Date  . Palpitations   . Varicose veins   . Near syncope   . Chronic tension headaches   . Nephrolithiasis     history of  . Hypertension    Past Surgical History  Procedure Laterality Date  . Abdominal hysterectomy    . Cholecystectomy    . Tonsillectomy    . Cataract surgery Right 03-02-2012   Family History  Problem Relation Age of Onset  . Cancer Mother     CERVICAL  . Heart disease Father   . Stroke Father    History  Substance Use Topics  . Smoking status: Never Smoker   . Smokeless tobacco: Never Used  . Alcohol Use: No   OB History   Grav Para Term Preterm Abortions TAB SAB Ect Mult Living                 Review of Systems  Constitutional: Positive for fever and appetite change.  HENT: Positive for congestion and sinus pressure.   Respiratory: Positive for cough.   Gastrointestinal: Positive for nausea and diarrhea.  Musculoskeletal: Positive for arthralgias and myalgias.   All other systems reviewed and are negative.    Allergies  Contrast media; Iohexol; Morphine; Ciprofloxacin; Penicillins; Sulfamethoxazole-trimethoprim; and Sulfonamide derivatives  Home Medications   Current Outpatient Rx  Name  Route  Sig  Dispense  Refill  . losartan-hydrochlorothiazide (HYZAAR) 50-12.5 MG per tablet   Oral   Take 1 tablet by mouth daily.   30 tablet   6   . Multiple Vitamin (MULTIVITAMIN WITH MINERALS) TABS tablet   Oral   Take 1 tablet by mouth daily. Women's formula          BP 122/109  Pulse 76  Temp(Src) 99.5 F (37.5 C) (Oral)  Resp 20  SpO2 95% Physical Exam  Nursing note and vitals reviewed. Constitutional: She is oriented to person, place, and time. She appears well-developed and well-nourished. No distress.  HENT:  Head: Normocephalic and atraumatic.  Nose: Mucosal edema present. Right sinus exhibits frontal sinus tenderness. Left sinus exhibits frontal sinus tenderness.  Mouth/Throat: Uvula is midline and oropharynx is clear and moist.  Eyes: Conjunctivae are normal.  Neck: Normal range of motion. Neck supple.  Cardiovascular: Normal rate, regular rhythm and normal heart sounds.   Pulmonary/Chest: Effort normal. No respiratory distress.  Mild expiratory wheezes in lower lung fields bilateral.  Abdominal: Soft. Normal appearance and bowel sounds are normal. She exhibits no distension and no mass. There is generalized tenderness. There is no rigidity,  no rebound and no guarding.  No peritoneal signs.  Musculoskeletal: Normal range of motion. She exhibits no edema.  Neurological: She is alert and oriented to person, place, and time.  Skin: Skin is warm and dry. She is not diaphoretic.  Psychiatric: She has a normal mood and affect. Her behavior is normal.    ED Course  Procedures (including critical care time) Labs Review Labs Reviewed - No data to display Imaging Review Dg Chest 2 View  02/13/2013   CLINICAL DATA:  Short of  breath, cough, fever and body aches  EXAM: CHEST  2 VIEW  COMPARISON:  Prior chest x-ray 01/22/2013 and 07/16/2008  FINDINGS: Stable appearance of the lungs with chronic central bronchitic changes and mild interstitial prominence dating back to June of 2010. Prominence of the right hilar vascular structures is also unchanged dating back to 2010. No definite adenopathy. Cardiac and mediastinal contours remain within normal limits. No new focal airspace consolidation, pleural effusion, pneumothorax or evidence of edema. No acute osseous abnormality.  IMPRESSION: Stable chest x-ray without evidence of active cardiopulmonary disease.   Electronically Signed   By: Jacqulynn Cadet M.D.   On: 02/13/2013 07:26    EKG Interpretation   None       MDM   1. Flu-like symptoms   2. Bronchospasm    Pt presenting with flulike symptoms. She is well appearing and in no apparent distress, temperature 90.9, vitals stable. Lungs with mild expiratory wheezes on lung bases. She was treated for bronchitis earlier in the month. Chest x-ray clear. O2 sat 98% nonrebreather. She received nebulizer treatment with clinical improvement of her symptoms. She is stable for discharge, albuterol inhaler given, discussed symptomatic treatment. Return precautions given. Patient states understanding of treatment care plan and is agreeable.     Illene Labrador, PA-C 02/13/13 [phone]

## 2013-02-13 NOTE — ED Notes (Signed)
Patient transported to X-ray 

## 2013-02-13 NOTE — ED Provider Notes (Signed)
Medical screening examination/treatment/procedure(s) were performed by non-physician practitioner and as supervising physician I was immediately available for consultation/collaboration.    Kalman Drape, MD 02/13/13 2120

## 2013-02-18 ENCOUNTER — Ambulatory Visit (INDEPENDENT_AMBULATORY_CARE_PROVIDER_SITE_OTHER): Payer: Federal, State, Local not specified - PPO | Admitting: Internal Medicine

## 2013-02-18 ENCOUNTER — Encounter: Payer: Self-pay | Admitting: Internal Medicine

## 2013-02-18 VITALS — BP 114/76 | HR 58 | Temp 98.3°F | Wt 253.0 lb

## 2013-02-18 DIAGNOSIS — R059 Cough, unspecified: Secondary | ICD-10-CM

## 2013-02-18 DIAGNOSIS — R05 Cough: Secondary | ICD-10-CM

## 2013-02-18 MED ORDER — PREDNISONE 10 MG PO TABS: 10.0000 mg | ORAL_TABLET | Freq: Every day | ORAL | Status: DC

## 2013-02-18 MED ORDER — BENZONATATE 100 MG PO CAPS: 100.0000 mg | ORAL_CAPSULE | Freq: Three times a day (TID) | ORAL | Status: DC

## 2013-02-18 NOTE — Progress Notes (Signed)
Pre visit review using our clinic review tool, if applicable. No additional management support is needed unless otherwise documented below in the visit note. 

## 2013-02-18 NOTE — Progress Notes (Signed)
   Subjective:    Patient ID: Alicia [name], female    DOB: October 06, 1955, 58 y.o.   MRN: [id number]  HPI Ms. Zellner was seen Jan 6th by Dr. Ronnald Ramp -diagnosed with bronchitis and treated with azithromycin. CXR was negative. She was seen in the ED Jan 28th for question of flu-like illness. Reviewed ED records: diffuse symptoms with myalgia, cough, wheezing; vitals ok with T 99.9, repeat CXR - no active disease. She was given neb treatment and she was sent home with albuterol inhaler. She does continue to cough, w/o sputum production or fever. She has had shortness of breath with paroxysms of coughing and has had some relief with use of albuterol MDI, up to 3 times a day. No deep wheezing by her report.  PMH, FamHx and SocHx reviewed for any changes and relevance.  Current Outpatient Prescriptions on File Prior to Visit  Medication Sig Dispense Refill  . losartan-hydrochlorothiazide (HYZAAR) 50-12.5 MG per tablet Take 1 tablet by mouth daily.  30 tablet  6  . Multiple Vitamin (MULTIVITAMIN WITH MINERALS) TABS tablet Take 1 tablet by mouth daily. Women's formula       No current facility-administered medications on file prior to visit.      Review of Systems System review is negative for any constitutional, cardiac, pulmonary, GI or neuro symptoms or complaints other than as described in the HPI.     Objective:   Physical Exam Filed Vitals:   02/18/13 1609  BP: 114/76  Pulse: 58  Temp: 98.3 F (36.8 C)  O2 sat - 95% RA Gen'l - overweight woman in no distress HEENT- throat clear Cor 2+ radial, RRR Pulm - normal respirations w/o increased WOB, Lungs - CTAP, specifically to rales, rhonchi or wheezing. Rhonchus cough noted which sounds like of upper airway origin       Assessment & Plan:  Cough - Your history, symptoms and exam are strongly suggest a cyclical cough: a respiratory infection started the cough which caused inflammation of the trachea/airway which caused cough which  caused irritation of the airway which causes cough, etc. There is no sign of active infection: no fever, no sputum production and clear lungs on exam  Plan Prednisone burst and taper over 12 days: 3 tabs daily x 3 days, 2 tabs daily x 3 day, 1 tab daily x 6 days for the inflammation  Tessalon perles 100 mg three times a day - a type of cough anesthetic for the tickle/irritation   Daytime - cough syrup of choice with Dextromethorophan (DM) and guafenesin (mucinex), e.g. Robitussin DM (generic)  Nighttime - continue with the hycodan  OK to use the albuterol inhaler for deep seated wheezing or struggling to breath. If you need to use this more than 3 times a day you need to be seen.

## 2013-02-18 NOTE — Patient Instructions (Signed)
Thanks for coming in.  Your history, symptoms and exam are strongly suggest a cyclical cough: a respiratory infection started the cough which caused inflammation of the trachea/airway which made you cough which caused irritation of the airway which makes you cough, etc. There is no sign of active infection: no fever, no sputum production and clear lungs on exam  Plan Prednisone burst and taper over 12 days: 3 tabs daily x 3 days, 2 tabs daily x 3 day, 1 tab daily x 6 days for the inflammation  Tessalon perles 100 mg three times a day - a type of cough anesthetic for the tickle/irritation   Daytime - cough syrup of choice with Dextromethorophan (DM) and guafenesin (mucinex), e.g. Robitussin DM (generic)  Nighttime - continue with the hycodan  OK to use the albuterol inhaler for deep seated wheezing or struggling to breath. If you need to use this more than 3 times a day you need to be seen.

## 2013-05-03 ENCOUNTER — Encounter (HOSPITAL_COMMUNITY): Payer: Self-pay | Admitting: Emergency Medicine

## 2013-05-03 ENCOUNTER — Emergency Department (HOSPITAL_COMMUNITY)
Admission: EM | Admit: 2013-05-03 | Discharge: 2013-05-03 | Disposition: A | Discharge status: Home / Self Care | Payer: Federal, State, Local not specified - PPO | Source: Home / Self Care | Attending: Family Medicine | Admitting: Family Medicine

## 2013-05-03 DIAGNOSIS — R609 Edema, unspecified: Secondary | ICD-10-CM

## 2013-05-03 DIAGNOSIS — R600 Localized edema: Secondary | ICD-10-CM

## 2013-05-03 LAB — POCT I-STAT, CHEM 8
BUN: 15 mg/dL (ref 6–23)
Calcium, Ion: 1.23 mmol/L (ref 1.12–1.23)
Chloride: 105 mEq/L (ref 96–112)
Creatinine, Ser: 0.8 mg/dL (ref 0.50–1.10)
Glucose, Bld: 83 mg/dL (ref 70–99)
HCT: 41 % (ref 36.0–46.0)
Hemoglobin: 13.9 g/dL (ref 12.0–15.0)
Potassium: 3.9 mEq/L (ref 3.7–5.3)
Sodium: 145 mEq/L (ref 137–147)
TCO2: 28 mmol/L (ref 0–100)

## 2013-05-03 MED ORDER — FUROSEMIDE 20 MG PO TABS: 20.0000 mg | ORAL_TABLET | Freq: Every day | ORAL | Status: DC

## 2013-05-03 NOTE — ED Provider Notes (Signed)
CSN: [id number]     Arrival date & time 05/03/13  1440 History   First MD Initiated Contact with Patient 05/03/13 1525     Chief Complaint  Patient presents with  . Leg Swelling   (Consider location/radiation/quality/duration/timing/severity/associated sxs/prior Treatment) Patient is a 58 y.o. female presenting with hypertension. The history is provided by the patient.  Hypertension This is a chronic problem. Episode onset: stopped taking losartin for bp in feb, recently akles have begun to swell. The problem has not changed since onset.Pertinent negatives include no chest pain, no abdominal pain and no shortness of breath.    Past Medical History  Diagnosis Date  . Palpitations   . Varicose veins   . Near syncope   . Chronic tension headaches   . Nephrolithiasis     history of  . Hypertension    Past Surgical History  Procedure Laterality Date  . Abdominal hysterectomy    . Cholecystectomy    . Tonsillectomy    . Cataract surgery Right 03-02-2012   Family History  Problem Relation Age of Onset  . Cancer Mother     CERVICAL  . Heart disease Father   . Stroke Father    History  Substance Use Topics  . Smoking status: Never Smoker   . Smokeless tobacco: Never Used  . Alcohol Use: No   OB History   Grav Para Term Preterm Abortions TAB SAB Ect Mult Living                 Review of Systems  Constitutional: Negative.   Respiratory: Negative.  Negative for cough, chest tightness and shortness of breath.   Cardiovascular: Positive for leg swelling. Negative for chest pain and palpitations.  Gastrointestinal: Negative for abdominal pain.    Allergies  Contrast media; Iohexol; Morphine; Ciprofloxacin; Penicillins; Sulfamethoxazole-trimethoprim; and Sulfonamide derivatives  Home Medications   Prior to Admission medications   Medication Sig Start Date End Date Taking? Authorizing Provider  benzonatate (TESSALON) 100 MG capsule Take 1 capsule (100 mg total) by mouth 3  (three) times daily. 02/18/13   Neena Rhymes, MD  losartan-hydrochlorothiazide (HYZAAR) 50-12.5 MG per tablet Take 1 tablet by mouth daily. 10/02/12   Neena Rhymes, MD  Multiple Vitamin (MULTIVITAMIN WITH MINERALS) TABS tablet Take 1 tablet by mouth daily. Women's formula    Historical Provider, MD  predniSONE (DELTASONE) 10 MG tablet Take 1 tablet (10 mg total) by mouth daily. 3 tabs daily for 3 days; 2 tabs daily for 3 days; 1 tab daily for 6 days 02/18/13   Neena Rhymes, MD   BP 146/74  Pulse 62  Temp(Src) 97.2 F (36.2 C) (Oral)  Resp 18  SpO2 98% Physical Exam  Nursing note and vitals reviewed. Constitutional: She is oriented to person, place, and time. She appears well-developed and well-nourished.  HENT:  Head: Normocephalic.  Neck: Normal range of motion. Neck supple.  Cardiovascular: Normal rate, regular rhythm, normal heart sounds and intact distal pulses.   Pulmonary/Chest: Effort normal and breath sounds normal.  Musculoskeletal: She exhibits edema. She exhibits no tenderness.  Neurological: She is alert and oriented to person, place, and time.  Skin: Skin is warm and dry.    ED Course  Procedures (including critical care time) Labs Review Labs Reviewed  POCT I-STAT, CHEM 8    Results for orders placed during the hospital encounter of 05/03/13  POCT I-STAT, CHEM 8      Result Value Ref Range   Sodium 145  137 - 147 mEq/L   Potassium 3.9  3.7 - 5.3 mEq/L   Chloride 105  96 - 112 mEq/L   BUN 15  6 - 23 mg/dL   Creatinine, Ser 0.80  0.50 - 1.10 mg/dL   Glucose, Bld 83  70 - 99 mg/dL   Calcium, Ion 1.23  1.12 - 1.23 mmol/L   TCO2 28  0 - 100 mmol/L   Hemoglobin 13.9  12.0 - 15.0 g/dL   HCT 41.0  36.0 - 46.0 %   Imaging Review No results found.  i-stat wnl. MDM   1. Edema of both legs        Billy Fischer, MD 05/03/13 [phone]

## 2013-05-03 NOTE — ED Notes (Signed)
Pt        Reports  Symptoms    Of  Legs  Swelling  Bilaterally         For         sev  Weeks      She  Reports   She  Was  On  Fluid  Pills  In  Past  But  None  Now          she   Is  Sitting  Upright on  Exam table  Speaking in  Complete  sentances  And  Is  In no  Acute  Distress

## 2013-05-03 NOTE — Discharge Instructions (Signed)
Use medicine as needed for swelling , see your doctor for further bp care.

## 2013-06-12 ENCOUNTER — Ambulatory Visit: Payer: Federal, State, Local not specified - PPO | Admitting: Internal Medicine

## 2013-07-10 ENCOUNTER — Ambulatory Visit (INDEPENDENT_AMBULATORY_CARE_PROVIDER_SITE_OTHER): Payer: Federal, State, Local not specified - PPO | Admitting: Internal Medicine

## 2013-07-10 ENCOUNTER — Encounter: Payer: Self-pay | Admitting: Internal Medicine

## 2013-07-10 VITALS — BP 136/90 | Temp 98.3°F | Ht 66.75 in | Wt 260.0 lb

## 2013-07-10 DIAGNOSIS — Z7189 Other specified counseling: Secondary | ICD-10-CM

## 2013-07-10 DIAGNOSIS — I83893 Varicose veins of bilateral lower extremities with other complications: Secondary | ICD-10-CM

## 2013-07-10 DIAGNOSIS — R609 Edema, unspecified: Secondary | ICD-10-CM

## 2013-07-10 DIAGNOSIS — Z7689 Persons encountering health services in other specified circumstances: Secondary | ICD-10-CM

## 2013-07-10 DIAGNOSIS — I872 Venous insufficiency (chronic) (peripheral): Secondary | ICD-10-CM

## 2013-07-10 DIAGNOSIS — M79609 Pain in unspecified limb: Secondary | ICD-10-CM

## 2013-07-10 DIAGNOSIS — M79605 Pain in left leg: Secondary | ICD-10-CM

## 2013-07-10 DIAGNOSIS — I10 Essential (primary) hypertension: Secondary | ICD-10-CM

## 2013-07-10 NOTE — Patient Instructions (Addendum)
150 minutes of exercise weeks  ,  Lose weight  To healthy levels. Avoid trans fats and processed foods;  Increase fresh fruits and veges to 5 servings per day. And avoid sweet beverages  Including tea and juice.  Will arrange to get ultrasound of left leg to R/O clotting . reconsider bp medication at fu if  Elevated still. Will notify you  of labs when available.  Plan ROV in about 1-2 months or as needed.   DASH Eating Plan DASH stands for "Dietary Approaches to Stop Hypertension." The DASH eating plan is a healthy eating plan that has been shown to reduce high blood pressure (hypertension). Additional health benefits may include reducing the risk of type 2 diabetes mellitus, heart disease, and stroke. The DASH eating plan may also help with weight loss. WHAT DO I NEED TO KNOW ABOUT THE DASH EATING PLAN? For the DASH eating plan, you will follow these general guidelines:  Choose foods with a percent daily value for sodium of less than 5% (as listed on the food label).  Use salt-free seasonings or herbs instead of table salt or sea salt.  Check with your health care provider or pharmacist before using salt substitutes.  Eat lower-sodium products, often labeled as "lower sodium" or "no salt added."  Eat fresh foods.  Eat more vegetables, fruits, and low-fat dairy products.  Choose whole grains. Look for the word "whole" as the first word in the ingredient list.  Choose fish and skinless chicken or [place] more often than red meat. Limit fish, poultry, and meat to 6 oz (170 g) each day.  Limit sweets, desserts, sugars, and sugary drinks.  Choose heart-healthy fats.  Limit cheese to 1 oz (28 g) per day.  Eat more home-cooked food and less restaurant, buffet, and fast food.  Limit fried foods.  Cook foods using methods other than frying.  Limit canned vegetables. If you do use them, rinse them well to decrease the sodium.  When eating at a restaurant, ask that your food be  prepared with less salt, or no salt if possible. WHAT FOODS CAN I EAT? Seek help from a dietitian for individual calorie needs. Grains Whole grain or whole wheat bread. Brown rice. Whole grain or whole wheat pasta. Quinoa, bulgur, and whole grain cereals. Low-sodium cereals. Corn or whole wheat flour tortillas. Whole grain cornbread. Whole grain crackers. Low-sodium crackers. Vegetables Fresh or frozen vegetables (raw, steamed, roasted, or grilled). Low-sodium or reduced-sodium tomato and vegetable juices. Low-sodium or reduced-sodium tomato sauce and paste. Low-sodium or reduced-sodium canned vegetables.  Fruits All fresh, canned (in natural juice), or frozen fruits. Meat and Other Protein Products Ground beef (85% or leaner), grass-fed beef, or beef trimmed of fat. Skinless chicken or [place]. Ground chicken or [place]. Pork trimmed of fat. All fish and seafood. Eggs. Dried beans, peas, or lentils. Unsalted nuts and seeds. Unsalted canned beans. Dairy Low-fat dairy products, such as skim or 1% milk, 2% or reduced-fat cheeses, low-fat ricotta or cottage cheese, or plain low-fat yogurt. Low-sodium or reduced-sodium cheeses. Fats and Oils Tub margarines without trans fats. Light or reduced-fat mayonnaise and salad dressings (reduced sodium). Avocado. Safflower, olive, or canola oils. Natural peanut or almond butter. Other Unsalted popcorn and pretzels. The items listed above may not be a complete list of recommended foods or beverages. Contact your dietitian for more options. WHAT FOODS ARE NOT RECOMMENDED? Grains White bread. White pasta. White rice. Refined cornbread. Bagels and croissants. Crackers that contain trans fat. Vegetables Creamed  or fried vegetables. Vegetables in a cheese sauce. Regular canned vegetables. Regular canned tomato sauce and paste. Regular tomato and vegetable juices. Fruits Dried fruits. Canned fruit in light or heavy syrup. Fruit juice. Meat and Other Protein  Products Fatty cuts of meat. Ribs, chicken wings, bacon, sausage, bologna, salami, chitterlings, fatback, hot dogs, bratwurst, and packaged luncheon meats. Salted nuts and seeds. Canned beans with salt. Dairy Whole or 2% milk, cream, half-and-half, and cream cheese. Whole-fat or sweetened yogurt. Full-fat cheeses or blue cheese. Nondairy creamers and whipped toppings. Processed cheese, cheese spreads, or cheese curds. Condiments Onion and garlic salt, seasoned salt, table salt, and sea salt. Canned and packaged gravies. Worcestershire sauce. Tartar sauce. Barbecue sauce. Teriyaki sauce. Soy sauce, including reduced sodium. Steak sauce. Fish sauce. Oyster sauce. Cocktail sauce. Horseradish. Ketchup and mustard. Meat flavorings and tenderizers. Bouillon cubes. Hot sauce. Tabasco sauce. Marinades. Taco seasonings. Relishes. Fats and Oils Butter, stick margarine, lard, shortening, ghee, and bacon fat. Coconut, palm kernel, or palm oils. Regular salad dressings. Other Pickles and olives. Salted popcorn and pretzels. The items listed above may not be a complete list of foods and beverages to avoid. Contact your dietitian for more information. WHERE CAN I FIND MORE INFORMATION? National [employer], [employer], and [employer] Institute: travelstabloid.com Document Released: 12/23/2010 Document Revised: 01/08/2013 Document Reviewed: 11/07/2012 [facility] Patient Information 2015 [place], [place]. This information is not intended to replace advice given to you by your health care provider. Make sure you discuss any questions you have with your health care provider.

## 2013-07-10 NOTE — Progress Notes (Signed)
Pre visit review using our clinic review tool, if applicable. No additional management support is needed unless otherwise documented below in the visit note.  Chief Complaint  Patient presents with  . Establish Care    leg pain swelling bp issues     HPI: Patient comes in today for  Transitional care visit   Prev pcp dr Linda Hedges retired   Ht problematic : Recent   Losartan  hctz was given but  Stopped ? Cough .didnt fell well and had no PCP to fu.  Taking lasix for swelling every other day.  Not reg not on potassium. . Diet changes  To do lsi. To help with BP  Leg edema in past but left leg sore for 4-6 months medial red blotches no ulcer or bleeding  discoloration  Left medal leg  Bothering her  Kind of sore and has v v .  riught not as bad.  No trauma  Neg hx of dvt   Health Maintenance  Topic Date Due  . Pap Smear  03/17/2012  . Tetanus/tdap  02/17/2013  . Influenza Vaccine  08/17/2013  . Mammogram  03/05/2014  . Colonoscopy  08/26/2020   Health Maintenance Review   ROS:  GEN/ HEENT: No fever, significant weight changes sweats headaches vision problems hearing changes, CV/ PULM; No chest pain shortness of breath cough, syncope,edema  change in exercise tolerance. GI /GU: No adominal pain, vomiting, change in bowel habits. No blood in the stool. No significant GU symptoms. SKIN/HEME: ,no acute skin rashes suspicious lesions or bleeding. No lymphadenopathy, nodules, masses.  NEURO/ PSYCH:  No neurologic signs such as weakness numbness. No depression anxiety. IMM/ Allergy: No unusual infections.  Allergy .   REST of 12 system review negative except as per HPI   Past Medical History  Diagnosis Date  . Palpitations   . Varicose veins   . Near syncope   . Chronic tension headaches   . Nephrolithiasis     history of  passed on own.   Marland Kitchen Hypertension   . Allergy   . Hx of varicella     Family History  Problem Relation Age of Onset  . Cancer Mother     CERVICAL uterin  cancer   . Liver disease Mother     from chronic heaptitis   . Heart disease Father     avdisease avr  . Stroke Father   . Cancer Sister     Ovarian and endometrial  . Cancer Maternal Aunt     Colon  . Heart disease Maternal Grandfather   . Heart disease Paternal Grandmother   . Heart disease Paternal Grandfather   . Diabetes Mother   . Diabetes Sister   . Diabetes Brother     History   Social History  . Marital Status: Married    Spouse Name: N/A    Number of Children: 3  . Years of Education: 16   Occupational History  . teacher [employer]   Social History Main Topics  . Smoking status: Never Smoker   . Smokeless tobacco: Never Used  . Alcohol Use: No  . Drug Use: No  . Sexual Activity: Yes    Partners: Male   Other Topics Concern  . None   Social History Narrative   [employer]- BA. Married '79. 2 daughters- '82, '93, 1 son- '87. 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 furosemide (LASIX) 20 MG tablet Take 1 tablet (20 mg total) by mouth daily.  . Multiple Vitamin (MULTIVITAMIN WITH MINERALS) TABS tablet Take 1 tablet by mouth daily. Women's formula  . [DISCONTINUED] benzonatate (TESSALON) 100 MG capsule Take 1 capsule (100 mg total) by mouth 3 (three) times daily.  . [DISCONTINUED] losartan-hydrochlorothiazide (HYZAAR) 50-12.5 MG per tablet Take 1 tablet by mouth daily.  . [DISCONTINUED] predniSONE (DELTASONE) 10 MG tablet Take 1 tablet (10 mg total) by mouth daily. 3 tabs daily for 3 days; 2 tabs daily for 3 days; 1 tab daily for 6 days    EXAM: BP Readings from Last 3 Encounters:  07/10/13 136/90  05/03/13 146/74  02/18/13 114/76    BP  136/90  Temp(Src) 98.3 F (36.8 C) (Oral)  Ht 5' 6.75" (1.695 m)  Wt 260 lb (117.935 kg)  BMI 41.05 kg/m2  Body mass index is 41.05 kg/(m^2).  Physical Exam: Vital signs reviewed YHC:WCBJ is a well-developed well-nourished alert cooperative    who appearsr stated age in no acute distress.  HEENT: normocephalic atraumatic , Eyes: PERRL EOM's full, conjunctiva clear, Nares: paten,t no deformity discharge or tenderness., Ears: no deformity EAC's clear TMs with normal landmarks. Mouth: clear OP, no lesions, edema.  Moist mucous membranes. Dentition in adequate repair. NECK: supple without masses, thyromegaly or bruits. CHEST/PULM:  Clear to auscultation and percussion breath sounds equal no wheeze , rales or rhonchi. No chest wall deformities or tenderness. CV: PMI is nondisplaced, S1 S2 no gallops, murmurs, rubs. Peripheral pulses are full without delay.No JVD .  ABDOMEN: Bowel sounds normal nontender  No guard or rebound, no hepato splenomegal no CVA tenderness.   Extremtities:  No clubbing cyanosis 1-2+ edema no acute joint swelling blotchy redness medially left  No ulcers  no focal atrophy NEURO:  Oriented x3, cranial nerves 3-12 appear to be intact, no obvious focal weakness,gait within normal limits no abnormal reflexes or asymmetrical SKIN: No acute rashes normal turgor, color, no bruising or petechiae.see legs  PSYCH: Oriented, good eye contact, no obvious depression anxiety, cognition and judgment appear normal. LN: no cervical  adenopathy    ASSESSMENT AND PLAN:  Discussed the following assessment and plan:  Left leg pain - asymmetrical couldlbe chronic but localized sx  get doppler and fu perhpas with vascular  idsc comp stockings - Plan: Basic metabolic panel, Hepatic function panel, Lipid panel, TSH, CANCELED: Lower Extremity Venous Duplex Left  Essential hypertension, benign - not optimum ? if se of hyzaar poss lower dose  lasix not usu adequat for bp control  - Plan: Basic  metabolic panel, Hepatic function panel, Lipid panel, TSH  PERIPHERAL EDEMA - Plan: Basic metabolic panel, Hepatic function panel, Lipid panel, TSH, CANCELED: Lower Extremity Venous Duplex Left  Varicose veins of lower extremities with other complications - Plan: Basic metabolic panel, Hepatic function panel, Lipid panel, TSH  Chronic venous insufficiency - Plan: Basic metabolic panel, Hepatic function panel, Lipid panel, TSH  Encounter to establish care with new doctor  Patient Care Team: Burnis Medin, MD as PCP - General (Internal Medicine) Rosetta Posner, MD (Vascular Surgery) Patient Instructions  150 minutes of exercise weeks  ,  Lose  weight  To healthy levels. Avoid trans fats and processed foods;  Increase fresh fruits and veges to 5 servings per day. And avoid sweet beverages  Including tea and juice.  Will arrange to get ultrasound of left leg to R/O clotting . reconsider bp medication at fu if  Elevated still. Will notify you  of labs when available.  Plan ROV in about 1-2 months or as needed.   DASH Eating Plan DASH stands for "Dietary Approaches to Stop Hypertension." The DASH eating plan is a healthy eating plan that has been shown to reduce high blood pressure (hypertension). Additional health benefits may include reducing the risk of type 2 diabetes mellitus, heart disease, and stroke. The DASH eating plan may also help with weight loss. WHAT DO I NEED TO KNOW ABOUT THE DASH EATING PLAN? For the DASH eating plan, you will follow these general guidelines:  Choose foods with a percent daily value for sodium of less than 5% (as listed on the food label).  Use salt-free seasonings or herbs instead of table salt or sea salt.  Check with your health care provider or pharmacist before using salt substitutes.  Eat lower-sodium products, often labeled as "lower sodium" or "no salt added."  Eat fresh foods.  Eat more vegetables, fruits, and low-fat dairy products.  Choose  whole grains. Look for the word "whole" as the first word in the ingredient list.  Choose fish and skinless chicken or [place] more often than red meat. Limit fish, poultry, and meat to 6 oz (170 g) each day.  Limit sweets, desserts, sugars, and sugary drinks.  Choose heart-healthy fats.  Limit cheese to 1 oz (28 g) per day.  Eat more home-cooked food and less restaurant, buffet, and fast food.  Limit fried foods.  Cook foods using methods other than frying.  Limit canned vegetables. If you do use them, rinse them well to decrease the sodium.  When eating at a restaurant, ask that your food be prepared with less salt, or no salt if possible. WHAT FOODS CAN I EAT? Seek help from a dietitian for individual calorie needs. Grains Whole grain or whole wheat bread. Brown rice. Whole grain or whole wheat pasta. Quinoa, bulgur, and whole grain cereals. Low-sodium cereals. Corn or whole wheat flour tortillas. Whole grain cornbread. Whole grain crackers. Low-sodium crackers. Vegetables Fresh or frozen vegetables (raw, steamed, roasted, or grilled). Low-sodium or reduced-sodium tomato and vegetable juices. Low-sodium or reduced-sodium tomato sauce and paste. Low-sodium or reduced-sodium canned vegetables.  Fruits All fresh, canned (in natural juice), or frozen fruits. Meat and Other Protein Products Ground beef (85% or leaner), grass-fed beef, or beef trimmed of fat. Skinless chicken or [place]. Ground chicken or [place]. Pork trimmed of fat. All fish and seafood. Eggs. Dried beans, peas, or lentils. Unsalted nuts and seeds. Unsalted canned beans. Dairy Low-fat dairy products, such as skim or 1% milk, 2% or reduced-fat cheeses, low-fat ricotta or cottage cheese, or plain low-fat yogurt. Low-sodium or reduced-sodium cheeses. Fats and Oils Tub margarines without trans fats. Light or reduced-fat mayonnaise and salad dressings (reduced sodium). Avocado. Safflower, olive, or canola oils. Natural peanut  or almond butter. Other Unsalted popcorn and pretzels. The items listed above may not be a complete list of recommended foods or beverages. Contact your dietitian for more options. WHAT FOODS ARE NOT RECOMMENDED? Grains White bread. White pasta. White rice. Refined cornbread. Bagels and croissants. Crackers that contain trans fat. Vegetables Creamed or fried vegetables. Vegetables in a cheese sauce.  Regular canned vegetables. Regular canned tomato sauce and paste. Regular tomato and vegetable juices. Fruits Dried fruits. Canned fruit in light or heavy syrup. Fruit juice. Meat and Other Protein Products Fatty cuts of meat. Ribs, chicken wings, bacon, sausage, bologna, salami, chitterlings, fatback, hot dogs, bratwurst, and packaged luncheon meats. Salted nuts and seeds. Canned beans with salt. Dairy Whole or 2% milk, cream, half-and-half, and cream cheese. Whole-fat or sweetened yogurt. Full-fat cheeses or blue cheese. Nondairy creamers and whipped toppings. Processed cheese, cheese spreads, or cheese curds. Condiments Onion and garlic salt, seasoned salt, table salt, and sea salt. Canned and packaged gravies. Worcestershire sauce. Tartar sauce. Barbecue sauce. Teriyaki sauce. Soy sauce, including reduced sodium. Steak sauce. Fish sauce. Oyster sauce. Cocktail sauce. Horseradish. Ketchup and mustard. Meat flavorings and tenderizers. Bouillon cubes. Hot sauce. Tabasco sauce. Marinades. Taco seasonings. Relishes. Fats and Oils Butter, stick margarine, lard, shortening, ghee, and bacon fat. Coconut, palm kernel, or palm oils. Regular salad dressings. Other Pickles and olives. Salted popcorn and pretzels. The items listed above may not be a complete list of foods and beverages to avoid. Contact your dietitian for more information. WHERE CAN I FIND MORE INFORMATION? National Heart, Lung, and Blood Institute: travelstabloid.com Document Released: 12/23/2010 Document  Revised: 01/08/2013 Document Reviewed: 11/07/2012 [facility] Patient Information 2015 [place], [place]. This information is not intended to replace advice given to you by your health care provider. Make sure you discuss any questions you have with your health care provider.     Standley Brooking. Panosh M.D.

## 2013-07-11 ENCOUNTER — Other Ambulatory Visit: Payer: Self-pay

## 2013-07-11 ENCOUNTER — Encounter: Payer: Self-pay | Admitting: Internal Medicine

## 2013-07-11 ENCOUNTER — Other Ambulatory Visit (HOSPITAL_COMMUNITY): Payer: Self-pay | Admitting: Cardiology

## 2013-07-11 DIAGNOSIS — Z7689 Persons encountering health services in other specified circumstances: Secondary | ICD-10-CM | POA: Insufficient documentation

## 2013-07-11 DIAGNOSIS — M7989 Other specified soft tissue disorders: Secondary | ICD-10-CM

## 2013-07-11 DIAGNOSIS — M79605 Pain in left leg: Secondary | ICD-10-CM | POA: Insufficient documentation

## 2013-07-11 DIAGNOSIS — M79606 Pain in leg, unspecified: Secondary | ICD-10-CM

## 2013-07-11 DIAGNOSIS — Z1231 Encounter for screening mammogram for malignant neoplasm of breast: Secondary | ICD-10-CM

## 2013-07-11 LAB — HEPATIC FUNCTION PANEL
ALT: 28 U/L (ref 0–35)
AST: 26 U/L (ref 0–37)
Albumin: 4.2 g/dL (ref 3.5–5.2)
Alkaline Phosphatase: 64 U/L (ref 39–117)
Bilirubin, Direct: 0.1 mg/dL (ref 0.0–0.3)
Total Bilirubin: 0.9 mg/dL (ref 0.2–1.2)
Total Protein: 7.4 g/dL (ref 6.0–8.3)

## 2013-07-11 LAB — LIPID PANEL
Cholesterol: 195 mg/dL (ref 0–200)
HDL: 58.4 mg/dL (ref >39.00)
LDL Cholesterol: 108 mg/dL — ABNORMAL HIGH (ref 0–99)
NonHDL: 136.6
Total CHOL/HDL Ratio: 3
Triglycerides: 143 mg/dL (ref 0.0–149.0)
VLDL: 28.6 mg/dL (ref 0.0–40.0)

## 2013-07-11 LAB — BASIC METABOLIC PANEL
BUN: 14 mg/dL (ref 6–23)
CO2: 30 mEq/L (ref 19–32)
Calcium: 9.2 mg/dL (ref 8.4–10.5)
Chloride: 107 mEq/L (ref 96–112)
Creatinine, Ser: 0.7 mg/dL (ref 0.4–1.2)
GFR: 99.75 mL/min (ref >60.00)
Glucose, Bld: 87 mg/dL (ref 70–99)
Potassium: 4.1 mEq/L (ref 3.5–5.1)
Sodium: 142 mEq/L (ref 135–145)

## 2013-07-11 LAB — TSH: TSH: 0.48 u[IU]/mL (ref 0.35–4.50)

## 2013-07-16 ENCOUNTER — Ambulatory Visit: Payer: Federal, State, Local not specified - PPO

## 2013-07-16 ENCOUNTER — Telehealth: Payer: Self-pay | Admitting: Internal Medicine

## 2013-07-16 ENCOUNTER — Ambulatory Visit (HOSPITAL_COMMUNITY): Payer: Federal, State, Local not specified - PPO | Attending: Internal Medicine | Admitting: Cardiology

## 2013-07-16 DIAGNOSIS — M7989 Other specified soft tissue disorders: Secondary | ICD-10-CM | POA: Insufficient documentation

## 2013-07-16 DIAGNOSIS — M79606 Pain in leg, unspecified: Secondary | ICD-10-CM

## 2013-07-16 DIAGNOSIS — M79609 Pain in unspecified limb: Secondary | ICD-10-CM

## 2013-07-16 NOTE — Telephone Encounter (Addendum)
Pt would like blood work results also please release results on mychart

## 2013-07-16 NOTE — Progress Notes (Signed)
Unilateral lower venous duplex performed  

## 2013-07-22 NOTE — Telephone Encounter (Signed)
Please see result note for plan.  °

## 2013-07-22 NOTE — Telephone Encounter (Signed)
Patient notified

## 2013-07-29 ENCOUNTER — Encounter: Payer: Self-pay | Admitting: Internal Medicine

## 2013-07-29 ENCOUNTER — Ambulatory Visit (INDEPENDENT_AMBULATORY_CARE_PROVIDER_SITE_OTHER): Payer: Federal, State, Local not specified - PPO | Admitting: Internal Medicine

## 2013-07-29 VITALS — BP 136/70 | Temp 98.3°F | Ht 66.75 in | Wt 262.0 lb

## 2013-07-29 DIAGNOSIS — R109 Unspecified abdominal pain: Secondary | ICD-10-CM

## 2013-07-29 DIAGNOSIS — R350 Frequency of micturition: Secondary | ICD-10-CM

## 2013-07-29 DIAGNOSIS — M549 Dorsalgia, unspecified: Secondary | ICD-10-CM

## 2013-07-29 DIAGNOSIS — Z87442 Personal history of urinary calculi: Secondary | ICD-10-CM

## 2013-07-29 LAB — POCT URINALYSIS DIPSTICK
Bilirubin, UA: NEGATIVE
Blood, UA: NEGATIVE
Glucose, UA: NEGATIVE
Ketones, UA: NEGATIVE
Leukocytes, UA: NEGATIVE
Nitrite, UA: NEGATIVE
Spec Grav, UA: >=1.03
Urobilinogen, UA: 0.2
pH, UA: 6

## 2013-07-29 MED ORDER — NITROFURANTOIN MONOHYD MACRO 100 MG PO CAPS: 100.0000 mg | ORAL_CAPSULE | Freq: Two times a day (BID) | ORAL | Status: DC

## 2013-07-29 NOTE — Patient Instructions (Signed)
Can try antibiotic waiting on the urin culture because of the urinary frequency and atypical for the stone .   If pain is  persistent or progressive we may need to get renal ct scan or urology help to intervene.  Will let you know culture results when available, Rov in  About a week or as needed if worse. It is possible this is a back problem but I agree that seems less likely because pattern of pain.

## 2013-07-29 NOTE — Progress Notes (Signed)
Pre visit review using our clinic review tool, if applicable. No additional management support is needed unless otherwise documented below in the visit note.  Chief Complaint  Patient presents with  . Back Pain    Started 07/28/13 when she woke.   . Flank Pain  . Urinary Frequency    HPI: Patient comes in today for SDA for  new problem evaluation. Awoke  Yesterday am with severe left to bilateral low back pain .Marland Kitchen.felt like   Kidney stone   Last one 2010    Hx of 2 passed both . Calcium stone. Tried advil.waxing and waning pain now better some today  Yesterday 10/10  And today  About 7/10/  No difference with motion. Different that reg back pain .  NO vomiting  Bowel urgency this am no diarrhea.  ROS: See pertinent positives and negatives per HPI. No fever chills  No blood inurine.    No change appetite  Took advil aleve with some help. Can take many antibiotic s  Rash and allergic to contrast  Dye.   Past Medical History  Diagnosis Date  . Palpitations   . Varicose veins   . Near syncope   . Chronic tension headaches   . Nephrolithiasis     history of  passed on own.   Marland Kitchen Hypertension   . Allergy   . Hx of varicella     Family History  Problem Relation Age of Onset  . Cancer Mother     CERVICAL uterin cancer   . Liver disease Mother     from chronic heaptitis   . Heart disease Father     avdisease avr  . Stroke Father   . Cancer Sister     Ovarian and endometrial  . Cancer Maternal Aunt     Colon  . Heart disease Maternal Grandfather   . Heart disease Paternal Grandmother   . Heart disease Paternal Grandfather   . Diabetes Mother   . Diabetes Sister   . Diabetes Brother     History   Social History  . Marital Status: Married    Spouse Name: N/A    Number of Children: 3  . Years of Education: 16   Occupational History  . teacher [employer]   Social History Main Topics  . Smoking status: Never Smoker   . Smokeless tobacco: Never Used  .  Alcohol Use: No  . Drug Use: No  . Sexual Activity: Yes    Partners: Male   Other Topics Concern  . None   Social History Narrative   [employer]- BA. 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 2 daughters- '82, '93, 1 son- '87. 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 furosemide (LASIX) 20 MG tablet Take 1 tablet (20 mg total) by mouth daily.  . Multiple Vitamin (MULTIVITAMIN WITH MINERALS) TABS tablet Take 1 tablet by mouth daily. Women's formula  . nitrofurantoin, macrocrystal-monohydrate, (MACROBID) 100 MG capsule Take 1 capsule (100 mg total) by mouth 2 (two) times daily.    EXAM:  BP 136/70  Temp(Src) 98.3 F (36.8 C) (Oral)  Ht  5' 6.75" (1.695 m)  Wt 262 lb (118.842 kg)  BMI 41.36 kg/m2  Body mass index is 41.36 kg/(m^2).  GENERAL: vitals reviewed and listed above, alert, oriented, appears well hydrated and in no acute distress HEENT: atraumatic, conjunctiva  clear, no obvious abnormalities on inspection of external nose and ears NECK: no obvious masses on inspection palpation  LUNGS: clear to auscultation bilaterally, no wheezes, rales or rhonchi,  CV: HRRR, no clubbing cyanosis or  peripheral edema nl cap refill  Abdomen:  Sof,t normal bowel sounds without hepatosplenomegaly, no guarding rebound or masses no CVA tenderness Points to lower flank back area  Non focal   MS: moves all extremities without noticeable focal  abnormality PSYCH: pleasant and cooperative, no obvious depression or anxiety UA trc protein otherwise negative  DS. Lab Results  Component Value Date   WBC 6.3 10/22/2012   HGB 13.9 05/03/2013   HCT 41.0 05/03/2013   PLT 213.0 10/22/2012   GLUCOSE 87 07/10/2013   CHOL  195 07/10/2013   TRIG 143.0 07/10/2013   HDL 58.40 07/10/2013   LDLCALC 108* 07/10/2013   ALT 28 07/10/2013   AST 26 07/10/2013   NA 142 07/10/2013   K 4.1 07/10/2013   CL 107 07/10/2013   CREATININE 0.7 07/10/2013   BUN 14 07/10/2013   CO2 30 07/10/2013   TSH 0.48 07/10/2013   INR 1.0 ratio 04/03/2009    ASSESSMENT AND PLAN:  Discussed the following assessment and plan:  Flank pain - Plan: POC Urinalysis Dipstick, Culture, Urine  Back pain, unspecified location - Plan: POC Urinalysis Dipstick, Culture, Urine  NEPHROLITHIASIS, HX OF - Plan: POC Urinalysis Dipstick, Culture, Urine  Urinary frequency - Plan: POC Urinalysis Dipstick, Culture, Urine Problematic allergic to many antibiotic and  conatrast  Dis optinos  Can rx empirically for infection and await cx and fu consdier renal ct i if not getting better.  (Pain out of proportion of severity from exam  Says feels like her renal stone  In past uncertain cause close observation warranted   ) -Patient advised to return or notify health care team  if symptoms worsen ,persist or new concerns arise.  Patient Instructions  Can try antibiotic waiting on the urin culture because of the urinary frequency and atypical for the stone .   If pain is  persistent or progressive we may need to get renal ct scan or urology help to intervene.  Will let you know culture results when available, Rov in  About a week or as needed if worse. It is possible this is a back problem but I agree that seems less likely because pattern of pain.    Standley Brooking. Alicia [name] M.D.

## 2013-07-31 ENCOUNTER — Telehealth: Payer: Self-pay | Admitting: Internal Medicine

## 2013-07-31 ENCOUNTER — Encounter: Payer: Self-pay | Admitting: Family Medicine

## 2013-07-31 ENCOUNTER — Ambulatory Visit (INDEPENDENT_AMBULATORY_CARE_PROVIDER_SITE_OTHER): Payer: Federal, State, Local not specified - PPO | Admitting: Family Medicine

## 2013-07-31 VITALS — BP 147/91 | HR 54 | Temp 98.6°F | Ht 66.75 in | Wt 262.0 lb

## 2013-07-31 DIAGNOSIS — M545 Low back pain, unspecified: Secondary | ICD-10-CM

## 2013-07-31 LAB — URINE CULTURE: Colony Count: 6000

## 2013-07-31 MED ORDER — CYCLOBENZAPRINE HCL 10 MG PO TABS: 10.0000 mg | ORAL_TABLET | Freq: Three times a day (TID) | ORAL | Status: DC | PRN

## 2013-07-31 MED ORDER — HYDROCODONE-ACETAMINOPHEN 5-325 MG PO TABS: 1.0000 | ORAL_TABLET | Freq: Four times a day (QID) | ORAL | Status: DC | PRN

## 2013-07-31 MED ORDER — PREDNISONE 10 MG PO TABS: ORAL_TABLET | ORAL | Status: DC

## 2013-07-31 NOTE — Telephone Encounter (Signed)
Patient Information:  Caller Name: Deshanta  Phone: [phone]  Patient: Alicia [name], Alicia [name]  Gender: Female  DOB: [birth year]  Age: 58 Years  PCP: Shanon Ace [facility])  Office Follow Up:  Does the office need to follow up with this patient?: No  Instructions For The Office: N/A   Symptoms  Reason For Call & Symptoms: Pt calling regarding lower back pain. Seen on 07/29/13 for same. Sore left pinky finger and forearm sore. Onset this am, 07/31/13. Back pain is worse when sitting now. PT is taking prescribed anitbx for possible UTI sx.  Reviewed Health History In EMR: Yes  Reviewed Medications In EMR: Yes  Reviewed Allergies In EMR: Yes  Reviewed Surgeries / Procedures: Yes  Date of Onset of Symptoms: 07/28/2013  Treatments Tried: Acetaminophen and Ibuprofen per package directions prn for pain.  Treatments Tried Worked: Yes  Guideline(s) Used:  Arm Pain  Disposition Per Guideline:   See Today or Tomorrow in Office  Reason For Disposition Reached:   Numbness (i.e., loss of sensation) in hand or fingers  Advice Given:  N/A  Patient Will Follow Care Advice:  YES  Appointment Scheduled:  07/31/2013 11:15:00 Appointment Scheduled Provider:  [facility])

## 2013-07-31 NOTE — Telephone Encounter (Signed)
Noted  

## 2013-07-31 NOTE — Progress Notes (Signed)
Pre visit review using our clinic review tool, if applicable. No additional management support is needed unless otherwise documented below in the visit note. 

## 2013-07-31 NOTE — Progress Notes (Signed)
   Subjective:    Patient ID: Alicia [name], female    DOB: November 27, 1955, 58 y.o.   MRN: [id number]  HPI Here for low back pain that started when she got out of bed 4 days ago. No recent trauma but she does relate having a fall on 05-24-13 for which she went to Urgent Care. She had neck sx at that time but not low back issues. This recent pain is sharp and centered in the lower back, no radiating to the legs. No urinary sx at all. She was here a few days ago and had a urine culture, which was negative. She has taken Macrobid for several days. Using heat.    Review of Systems  Constitutional: Negative.   Genitourinary: Negative.   Musculoskeletal: Positive for back pain.       Objective:   Physical Exam  Constitutional:  In mild pain  Musculoskeletal:  Tender over the center of the lower back. ROM is full but extension is painful. Negative SLR          Assessment & Plan:  This is spinal pain, possibly related to a fall several months ago. She will stop Macrobid. Use Flexeril for spasm and she is given a prednisone taper to reduce inflammation. Add Vicodin for pain. Recheck prn

## 2013-08-15 ENCOUNTER — Telehealth: Payer: Self-pay | Admitting: Internal Medicine

## 2013-08-15 NOTE — Telephone Encounter (Signed)
Patient Information:  Caller Name: Rilynn  Phone: [phone]  Patient: Alicia [name], Alicia [name]  Gender: Female  DOB: 02/24/1955  Age: 58 Years  PCP: Shanon Ace ([facility])  Office Follow Up:  Does the office need to follow up with this patient?: Yes  Instructions For The Office: Pt would like to ask Dr Regis Bill if the sxs she is having is from being on Prednisone or coming off of Prednisone?   Symptoms  Reason For Call & Symptoms: 07/29/13 lower bain pain.  Seen by Dr Regis Bill.   07/31/13 pain worsened and seen by Dr Sherren Mocha.  Prescribed Prednisone due to possible inflammation in back.   Completed Prednisone on 08/10/13.  Back pain did improve during this time.  08/12/13 pt had leg cramps during the night, one episode only.  08/13/13 and 08/14/13 diarrhea with stomach pains.  08/14/13 edge of tongue numb.   08/15/13 no further diarrhea.  Lips are mildly swollen.  Edge of tongue continues to be numb but not as bad today.   Afebrile.   Care advice given with callback perimeters per protocol.   Pt would like to ask Dr Regis Bill if the sxs she is having may be from being on Prednisone or coming off of Prednisone?  Reviewed Health History In EMR: Yes  Reviewed Medications In EMR: Yes  Reviewed Allergies In EMR: Yes  Reviewed Surgeries / Procedures: Yes  Date of Onset of Symptoms: 08/14/2013  Guideline(s) Used:  Face Swelling  Disposition Per Guideline:   Home Care  Reason For Disposition Reached:   Mild facial swelling of unknown cause  Advice Given:  Reassurance:  Mild face swelling or puffiness can be caused by a mild allergic reaction. For example people can have a reaction to pollen, something they have eaten, a chemical, or some other allergic substance.  Here is some care advice that may help.  Remove Allergens:  Take a shower to remove pollens, animal dander or other allergic substances from the body and hair.  Apply Cold to the Area:  Apply a cool, wet washcloth to the face for 20 minutes.  Antihistamine Medication for Facial Swelling:  Sometimes antihistamine medications are helpful in reducing swelling from a food or allergic reaction. You can take diphenhydramine (e.g., Benadryl). The adult dosage 25-50 mg by mouth every 6 hours on an as needed basis.  Loratadine is a newer (second generation) antihistamine. The dosage of loratadine (e.g., OTC Claritin, Alavert) is 10 mg once a day.  Cetirizine is a newer (second generation) antihistamine. The dosage of cetirizine (e.g., OTC Zyrtec) is 10 mg once a day.  Call Back If  Swelling persists over 3 days  Swelling becomes red or painful to the touch  You become worse.  Patient Will Follow Care Advice:  YES

## 2013-08-15 NOTE — Telephone Encounter (Signed)
Spoke to pt told her Dr. Regis Bill reviewed message and  she said she is not sure if related to prednisone. Asked pt if any white patches on tongue or respiratory problems? Pt stated no. Told pt to monitor symptoms if continues to call back. Pt verbalized understanding.

## 2013-08-15 NOTE — Telephone Encounter (Signed)
Discussed with Dr. Regis Bill and she said she is not sure if related to prednisone. Ask pt if any white patches on tongue or respiratory problems.

## 2013-08-29 ENCOUNTER — Emergency Department (HOSPITAL_COMMUNITY): Payer: Federal, State, Local not specified - PPO

## 2013-08-29 ENCOUNTER — Emergency Department (HOSPITAL_COMMUNITY)
Admission: EM | Admit: 2013-08-29 | Discharge: 2013-08-29 | Disposition: A | Discharge status: Home / Self Care | Payer: Federal, State, Local not specified - PPO | Attending: Emergency Medicine | Admitting: Emergency Medicine

## 2013-08-29 ENCOUNTER — Encounter (HOSPITAL_COMMUNITY): Payer: Self-pay | Admitting: Emergency Medicine

## 2013-08-29 ENCOUNTER — Emergency Department (HOSPITAL_COMMUNITY)
Admission: EM | Admit: 2013-08-29 | Discharge: 2013-08-29 | Disposition: A | Discharge status: Hospice | Payer: Federal, State, Local not specified - PPO | Source: Home / Self Care | Attending: Emergency Medicine | Admitting: Emergency Medicine

## 2013-08-29 DIAGNOSIS — M545 Low back pain, unspecified: Secondary | ICD-10-CM | POA: Diagnosis not present

## 2013-08-29 DIAGNOSIS — G8929 Other chronic pain: Secondary | ICD-10-CM | POA: Diagnosis not present

## 2013-08-29 DIAGNOSIS — Z87442 Personal history of urinary calculi: Secondary | ICD-10-CM | POA: Insufficient documentation

## 2013-08-29 DIAGNOSIS — Z90710 Acquired absence of both cervix and uterus: Secondary | ICD-10-CM | POA: Insufficient documentation

## 2013-08-29 DIAGNOSIS — R1011 Right upper quadrant pain: Secondary | ICD-10-CM

## 2013-08-29 DIAGNOSIS — Z9089 Acquired absence of other organs: Secondary | ICD-10-CM | POA: Insufficient documentation

## 2013-08-29 DIAGNOSIS — R1033 Periumbilical pain: Secondary | ICD-10-CM

## 2013-08-29 DIAGNOSIS — R1013 Epigastric pain: Secondary | ICD-10-CM | POA: Insufficient documentation

## 2013-08-29 DIAGNOSIS — E669 Obesity, unspecified: Secondary | ICD-10-CM | POA: Diagnosis not present

## 2013-08-29 DIAGNOSIS — Z79899 Other long term (current) drug therapy: Secondary | ICD-10-CM | POA: Diagnosis not present

## 2013-08-29 DIAGNOSIS — I10 Essential (primary) hypertension: Secondary | ICD-10-CM | POA: Diagnosis not present

## 2013-08-29 DIAGNOSIS — Z8619 Personal history of other infectious and parasitic diseases: Secondary | ICD-10-CM | POA: Diagnosis not present

## 2013-08-29 LAB — URINALYSIS, ROUTINE W REFLEX MICROSCOPIC
Bilirubin Urine: NEGATIVE
Glucose, UA: NEGATIVE mg/dL
Hgb urine dipstick: NEGATIVE
Ketones, ur: NEGATIVE mg/dL
Leukocytes, UA: NEGATIVE
Nitrite: NEGATIVE
Protein, ur: NEGATIVE mg/dL
Specific Gravity, Urine: 1.023 (ref 1.005–1.030)
Urobilinogen, UA: 1 mg/dL (ref 0.0–1.0)
pH: 5.5 (ref 5.0–8.0)

## 2013-08-29 LAB — COMPREHENSIVE METABOLIC PANEL
ALT: 25 U/L (ref 0–35)
AST: 26 U/L (ref 0–37)
Albumin: 3.7 g/dL (ref 3.5–5.2)
Alkaline Phosphatase: 74 U/L (ref 39–117)
Anion gap: 13 (ref 5–15)
BUN: 13 mg/dL (ref 6–23)
CO2: 24 mEq/L (ref 19–32)
Calcium: 8.8 mg/dL (ref 8.4–10.5)
Chloride: 107 mEq/L (ref 96–112)
Creatinine, Ser: 0.65 mg/dL (ref 0.50–1.10)
GFR calc Af Amer: >90 mL/min (ref >90)
GFR calc non Af Amer: >90 mL/min (ref >90)
Glucose, Bld: 100 mg/dL — ABNORMAL HIGH (ref 70–99)
Potassium: 3.9 mEq/L (ref 3.7–5.3)
Sodium: 144 mEq/L (ref 137–147)
Total Bilirubin: 0.6 mg/dL (ref 0.3–1.2)
Total Protein: 7.2 g/dL (ref 6.0–8.3)

## 2013-08-29 LAB — CBC WITH DIFFERENTIAL/PLATELET
Basophils Absolute: 0 10*3/uL (ref 0.0–0.1)
Basophils Relative: 0 % (ref 0–1)
Eosinophils Absolute: 0.2 10*3/uL (ref 0.0–0.7)
Eosinophils Relative: 3 % (ref 0–5)
HCT: 40.9 % (ref 36.0–46.0)
Hemoglobin: 13.7 g/dL (ref 12.0–15.0)
Lymphocytes Relative: 28 % (ref 12–46)
Lymphs Abs: 1.3 10*3/uL (ref 0.7–4.0)
MCH: 31.3 pg (ref 26.0–34.0)
MCHC: 33.5 g/dL (ref 30.0–36.0)
MCV: 93.4 fL (ref 78.0–100.0)
Monocytes Absolute: 0.6 10*3/uL (ref 0.1–1.0)
Monocytes Relative: 12 % (ref 3–12)
Neutro Abs: 2.6 10*3/uL (ref 1.7–7.7)
Neutrophils Relative %: 57 % (ref 43–77)
Platelets: 206 10*3/uL (ref 150–400)
RBC: 4.38 MIL/uL (ref 3.87–5.11)
RDW: 12.1 % (ref 11.5–15.5)
WBC: 4.7 10*3/uL (ref 4.0–10.5)

## 2013-08-29 LAB — POCT URINALYSIS DIP (DEVICE)
Bilirubin Urine: NEGATIVE
Glucose, UA: NEGATIVE mg/dL
Hgb urine dipstick: NEGATIVE
Ketones, ur: NEGATIVE mg/dL
Leukocytes, UA: NEGATIVE
Nitrite: NEGATIVE
Protein, ur: NEGATIVE mg/dL
Specific Gravity, Urine: >=1.03 (ref 1.005–1.030)
Urobilinogen, UA: 0.2 mg/dL (ref 0.0–1.0)
pH: 5.5 (ref 5.0–8.0)

## 2013-08-29 LAB — LIPASE, BLOOD: Lipase: 28 U/L (ref 11–59)

## 2013-08-29 NOTE — Discharge Instructions (Signed)
We have determined that your problem requires further evaluation in the emergency department.  We will take care of your transport there.  Once at the emergency department, you will be evaluated by a provider and they will order whatever treatment or tests they deem necessary.  We cannot guarantee that they will do any specific test or do any specific treatment.  ° °

## 2013-08-29 NOTE — ED Provider Notes (Signed)
CSN: [id number]     Arrival date & time 08/29/13  1011 History   First MD Initiated Contact with Patient 08/29/13 1115     Chief Complaint  Patient presents with  . Abdominal Pain     (Consider location/radiation/quality/duration/timing/severity/associated sxs/prior Treatment) HPI Comments: Patient is a 58 year old female with a past medical history of chronic tension headaches, nephrolithiasis, hypertension and status post cholecystectomy in 1990 who presents to the emergency department from urgent care with periumbilical and upper abdominal pain x3-4 days. Pain is constant, described as a dull ache, rated 5/10. No aggravating or alleviating factors. Admits to associated decreased appetite. Denies fever, chills, nausea, vomiting. States she has had intermittent diarrhea and constipation, nonbloody. She reports her upper abdomen looks swollen. Denies any urinary symptoms. Patient reports at the end of June, she started to experience low back pain. At that time she saw her PCP and was given hydrocodone with only temporary relief. She returns to her PCP and was given a 12 day course of prednisone which relieved her symptoms up until one week ago. Back pain is nonradiating. Denies numbness or tingling radiating down her extremities. Denies loss of control of bowels or bladder or saddle anesthesia. At urgent care, there was concern of possible diverticulitis, so patient was sent to the emergency department for further evaluation. States she had a colonoscopy about 5 years ago which showed diverticulosis.  Patient is a 58 y.o. female presenting with abdominal pain. The history is provided by the patient and medical records.  Abdominal Pain   Past Medical History  Diagnosis Date  . Palpitations   . Varicose veins   . Near syncope   . Chronic tension headaches   . Nephrolithiasis     history of  passed on own.   Marland Kitchen Hypertension   . Allergy   . Hx of varicella    Past Surgical History  Procedure  Laterality Date  . Abdominal hysterectomy      fibroids non cancer   . Cholecystectomy    . Tonsillectomy    . Cataract surgery Right 03-02-2012  . Cataract extraction      Bilateral   Family History  Problem Relation Age of Onset  . Cancer Mother     CERVICAL uterin cancer   . Liver disease Mother     from chronic heaptitis   . Heart disease Father     avdisease avr  . Stroke Father   . Cancer Sister     Ovarian and endometrial  . Cancer Maternal Aunt     Colon  . Heart disease Maternal Grandfather   . Heart disease Paternal Grandmother   . Heart disease Paternal Grandfather   . Diabetes Mother   . Diabetes Sister   . Diabetes Brother    History  Substance Use Topics  . Smoking status: Never Smoker   . Smokeless tobacco: Never Used  . Alcohol Use: No   OB History   Grav Para Term Preterm Abortions TAB SAB Ect Mult Living   3 3 3             Review of Systems  Constitutional: Positive for appetite change.  Gastrointestinal: Positive for abdominal pain and abdominal distention.  Musculoskeletal: Positive for back pain.  All other systems reviewed and are negative.     Allergies  Contrast media; Iohexol; Morphine; Ciprofloxacin; Penicillins; Sulfamethoxazole-trimethoprim; and Sulfonamide derivatives  Home Medications   Prior to Admission medications   Medication Sig Start Date End Date Taking? Authorizing  Provider  hydrochlorothiazide (HYDRODIURIL) 25 MG tablet Take 25 mg by mouth daily.   Yes Historical Provider, MD  Multiple Vitamin (MULTIVITAMIN WITH MINERALS) TABS tablet Take 1 tablet by mouth daily. Women's formula   Yes Historical Provider, MD  predniSONE (DELTASONE) 10 MG tablet Take 4 pills a day for 3 days, then 3 a day for 3 days, then 2 a day for 3 days, then 1 a day for 3 days, then stop 07/31/13   Laurey Morale, MD   BP 134/74  Pulse 46  Temp(Src) 97.7 F (36.5 C) (Oral)  Resp 15  Ht 5\' 6"  (1.676 m)  Wt 250 lb (113.399 kg)  BMI 40.37 kg/m2   SpO2 100% Physical Exam  Nursing note and vitals reviewed. Constitutional: She is oriented to person, place, and time. She appears well-developed and well-nourished. No distress.  Obese.  HENT:  Head: Normocephalic and atraumatic.  Mouth/Throat: Oropharynx is clear and moist.  Eyes: Conjunctivae are normal.  Neck: Normal range of motion. Neck supple. No spinous process tenderness and no muscular tenderness present.  Cardiovascular: Normal rate, regular rhythm and normal heart sounds.   Pulmonary/Chest: Effort normal and breath sounds normal. No respiratory distress.  Abdominal: Soft. Bowel sounds are normal. She exhibits distension (slight upper abdominal distension). There is tenderness in the right upper quadrant, epigastric area, periumbilical area and left upper quadrant. There is no rigidity, no rebound, no guarding and no CVA tenderness.  No peritoneal signs.  Musculoskeletal: Normal range of motion. She exhibits no edema.  No lumbar spinous process or muscular tenderness.  Neurological: She is alert and oriented to person, place, and time. She has normal strength.  Strength lower extremities 5/5 and equal bilateral. Sensation intact. Normal gait.  Skin: Skin is warm and dry. No rash noted. She is not diaphoretic.  Psychiatric: She has a normal mood and affect. Her behavior is normal.    ED Course  Procedures (including critical care time) Labs Review Labs Reviewed  COMPREHENSIVE METABOLIC PANEL - Abnormal; Notable for the following:    Glucose, Bld 100 (*)    All other components within normal limits  URINALYSIS, ROUTINE W REFLEX MICROSCOPIC - Abnormal; Notable for the following:    Color, Urine AMBER (*)    APPearance CLOUDY (*)    All other components within normal limits  CBC WITH DIFFERENTIAL  LIPASE, BLOOD    Imaging Review Ct Abdomen Pelvis Wo Contrast  08/29/2013   CLINICAL DATA:  Right upper quadrant abdominal pain  EXAM: CT ABDOMEN AND PELVIS WITHOUT CONTRAST   TECHNIQUE: Multidetector CT imaging of the abdomen and pelvis was performed following the standard protocol without IV contrast.  COMPARISON:  CT scan of the abdomen and pelvis of July 19, 2010.  FINDINGS: The liver exhibits decreased did density diffusely consistent with fatty infiltration. There is gas within the biliary tree consistent with previous sphincterotomy and cholecystectomy. The pancreas, spleen, partially distended stomach, adrenal glands, and kidneys exhibit no acute abnormalities. There is a cortically based posterior midpole hypodensity in the right kidney which measures 3 x 2.7 cm. It exhibits HU value of +8 consist with a cyst. It has increased slightly in size since the previous study. There is a hyperdense 9 mm focus exophytic from the lower pole of the left kidney which exhibits HU value of +36. This is likely reflects a stable hyperdense cyst.  The caliber of the abdominal aorta is normal. The periaortic and pericaval regions are normal. The small and large bowel  exhibit no ileus nor obstruction or acute inflammation. The appendix is normal. There is stable parenchymal density with associated suture material in the adnexal regions bilaterally. The uterus is surgically absent. The urinary bladder and rectosigmoid colon are unremarkable. There is a small fat containing umbilical hernia. There is no evidence of ascites.  The lumbar spine and bony pelvis are unremarkable. The lung bases are clear.  IMPRESSION: 1. There fatty infiltrative changes of the liver which appears stable. There is mild pneumobilia likely secondary to sphincterotomy and prior cholecystectomy. There is no acute abnormality of the pancreas. 2. There is no acute bowel abnormality. 3. Stable findings in both kidneys consistent with cysts. On the left the structure is hyperdense.   Electronically Signed   By: David  Martinique   On: 08/29/2013 14:37     EKG Interpretation None      MDM   Final diagnoses:  Epigastric pain   Periumbilical abdominal pain  Right upper quadrant abdominal pain  Low back pain without sciatica, unspecified back pain laterality   Patient sent from urgent care with abdominal pain. She is nontoxic appearing and in no apparent distress. Afebrile, vital signs stable. Labs obtained in triage prior to patient being seen, no acute findings. CT abdomen/pelvis pending. Patient cannot have contrast due to allergy. She is refusing anything for pain at this time. 2:50 PM CT scan showing fatty infiltrative changes of the liver which appear stable, no acute findings. I discussed results with patient, she then reports she has a history of irritable bowel syndrome. Symptoms may be related to irritable bowel syndrome. I advised her followup with her primary care physician. On repeat abdominal exam, abdomen remains without acute peritoneal signs. Stable for discharge. Return precautions given. Patient states understanding of treatment care plan and is agreeable.  Illene Labrador, PA-C 08/29/13 1452

## 2013-08-29 NOTE — ED Notes (Signed)
Per states she has had RUQ pain for three days as well as lower back pain for two moths. Denies painful urination or diarrhea. Does not have gallbladder.

## 2013-08-29 NOTE — ED Notes (Signed)
Patient is finished with oral contrast, informed Damien in [place]

## 2013-08-29 NOTE — ED Provider Notes (Signed)
Medical screening examination/treatment/procedure(s) were performed by non-physician practitioner and as supervising physician I was immediately available for consultation/collaboration.   EKG Interpretation None        Houston Siren III, MD 08/29/13 [phone]

## 2013-08-29 NOTE — ED Notes (Signed)
Pt drinking oral drink from CT (not contrast media)

## 2013-08-29 NOTE — Discharge Instructions (Signed)
Abdominal Pain Many things can cause abdominal pain. Usually, abdominal pain is not caused by a disease and will improve without treatment. It can often be observed and treated at home. Your health care provider will do a physical exam and possibly order blood tests and X-rays to help determine the seriousness of your pain. However, in many cases, more time must pass before a clear cause of the pain can be found. Before that point, your health care provider may not know if you need more testing or further treatment. HOME CARE INSTRUCTIONS  Monitor your abdominal pain for any changes. The following actions may help to alleviate any discomfort you are experiencing:  Only take over-the-counter or prescription medicines as directed by your health care provider.  Do not take laxatives unless directed to do so by your health care provider.  Try a clear liquid diet (broth, tea, or water) as directed by your health care provider. Slowly move to a bland diet as tolerated. SEEK MEDICAL CARE IF:  You have unexplained abdominal pain.  You have abdominal pain associated with nausea or diarrhea.  You have pain when you urinate or have a bowel movement.  You experience abdominal pain that wakes you in the night.  You have abdominal pain that is worsened or improved by eating food.  You have abdominal pain that is worsened with eating fatty foods.  You have a fever. SEEK IMMEDIATE MEDICAL CARE IF:   Your pain does not go away within 2 hours.  You keep throwing up (vomiting).  Your pain is felt only in portions of the abdomen, such as the right side or the left lower portion of the abdomen.  You pass bloody or black tarry stools. MAKE SURE YOU:  Understand these instructions.   Will watch your condition.   Will get help right away if you are not doing well or get worse.  Document Released: 10/13/2004 Document Revised: 01/08/2013 Document Reviewed: 09/12/2012 [facility] Patient Information  2015 [place], [place]. This information is not intended to replace advice given to you by your health care provider. Make sure you discuss any questions you have with your health care provider.  Back Pain, Adult Low back pain is very common. About 1 in 5 people have back pain.The cause of low back pain is rarely dangerous. The pain often gets better over time.About half of people with a sudden onset of back pain feel better in just 2 weeks. About 8 in 10 people feel better by 6 weeks.  CAUSES Some common causes of back pain include:  Strain of the muscles or ligaments supporting the spine.  Wear and tear (degeneration) of the spinal discs.  Arthritis.  Direct injury to the back. DIAGNOSIS Most of the time, the direct cause of low back pain is not known.However, back pain can be treated effectively even when the exact cause of the pain is unknown.Answering your caregiver's questions about your overall health and symptoms is one of the most accurate ways to make sure the cause of your pain is not dangerous. If your caregiver needs more information, he or she may order lab work or imaging tests (X-rays or MRIs).However, even if imaging tests show changes in your back, this usually does not require surgery. HOME CARE INSTRUCTIONS For many people, back pain returns.Since low back pain is rarely dangerous, it is often a condition that people can learn to [facility] their own.   Remain active. It is stressful on the back to sit or stand in  place. Do not sit, drive, or stand in one place for more than 30 minutes at a time. Take short walks on level surfaces as soon as pain allows. Try to increase the length of time you walk each day. °· Do not stay in bed. Resting more than 1 or 2 days can delay your recovery. °· Do not avoid exercise or work. Your body is made to move. It is not dangerous to be active, even though your back may hurt. Your back will likely heal faster if you return to being active  before your pain is gone. °· Pay attention to your body when you  bend and lift. Many people have less discomfort when lifting if they bend their knees, keep the load close to their bodies, and avoid twisting. Often, the most comfortable positions are those that put less stress on your recovering back. °· Find a comfortable position to sleep. Use a firm mattress and lie on your side with your knees slightly bent. If you lie on your back, put a pillow under your knees. °· Only take over-the-counter or prescription medicines as directed by your caregiver. Over-the-counter medicines to reduce pain and inflammation are often the most helpful. Your caregiver may prescribe muscle relaxant drugs. These medicines help dull your pain so you can more quickly return to your normal activities and healthy exercise. °· Put ice on the injured area. °¨ Put ice in a plastic bag. °¨ Place a towel between your skin and the bag. °¨ Leave the ice on for 15-20 minutes, 03-04 times a day for the first 2 to 3 days. After that, ice and heat may be alternated to reduce pain and spasms. °· Ask your caregiver about trying back exercises and gentle massage. This may be of some benefit. °· Avoid feeling anxious or stressed. Stress increases muscle tension and can worsen back pain. It is important to recognize when you are anxious or stressed and learn ways to manage it. Exercise is a great option. °SEEK MEDICAL CARE IF: °· You have pain that is not relieved with rest or medicine. °· You have pain that does not improve in 1 week. °· You have new symptoms. °· You are generally not feeling well. °SEEK IMMEDIATE MEDICAL CARE IF:  °· You have pain that radiates from your back into your legs. °· You develop new bowel or bladder control problems. °· You have unusual weakness or numbness in your arms or legs. °· You develop nausea or vomiting. °· You develop abdominal pain. °· You feel faint. °Document Released: 01/03/2005 Document Revised: 07/05/2011  Document Reviewed: 05/07/2013 °[employer]® Patient Information ©2015 [employer]. This information is not intended to replace advice given to you by your health care provider. Make sure you discuss any questions you have with your health care provider. ° °

## 2013-08-29 NOTE — ED Provider Notes (Addendum)
Chief Complaint   Chief Complaint  Patient presents with  . Back Pain  . 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 She saw her primary care physician, Dr. Shanon Ace. She was given hydrocodone. She returned to their office and saw another physician and was given a course of prednisone which relieved the pain. A week ago the back pain recurred. It does not radiate down into the legs. There is no numbness, tingling or weakness. 3 or 4 days ago she developed abdominal pain around the naval and right upper quadrant. This is a constant pain described as an ache, it is rated a 5-6/10 in intensity, nothing makes it better or worse. There's been no associated fever, chills, nausea, or vomiting. The patient thinks her appetite is a little bit diminished. There's been no weight loss. No diarrhea, constipation, melena, or blood in stool. No urinary or GYN complaints. She has had a hysterectomy and cholecystectomy. She has a history of irritable bowel syndrome. There is a family history of ovarian cancer in a sister. She gets a yearly ultrasound and CA 125 antigen. She last had this last October.  Review of Systems   Other than as noted above, the patient denies any of the following symptoms: Constitutional:  No fever, chills, weight loss or anorexia. Abdomen:  No nausea, vomiting, hematememesis, melena, diarrhea, or hematochezia. GU:  No dysuria, frequency, urgency, or hematuria. Gyn:  No vaginal discharge, itching, abnormal bleeding, dyspareunia, or pelvic pain.  [place]   Past medical history, family history, social history, meds, and allergies were reviewed. She has allergies to contrast media, morphine, ciprofloxacin, penicillin, and sulfa drugs. She takes hydrochlorothiazide for high blood pressure.  Physical Exam     Vital signs:  BP 149/85  Pulse 62  Temp(Src) 98.6  F (37 C) (Oral)  Resp 16  SpO2 98% Gen:  Alert, oriented, in no distress. Lungs:  Breath sounds clear and equal bilaterally.  No wheezes, rales or rhonchi. Heart:  Regular rhythm.  No gallops or murmers.   Abdomen:  Soft, flat, nondistended. No organomegaly or mass. Bowel sounds are normally active. She has moderate tenderness to palpation in the right upper quadrant and the periumbilical area without guarding or rebound. Murphy sign and Murphy's punch were negative. Skin:  Clear, warm and dry.  No rash.  Labs   Results for orders placed during the hospital encounter of 08/29/13  POCT URINALYSIS DIP (DEVICE)      Result Value Ref Range   Glucose, UA NEGATIVE  NEGATIVE mg/dL   Bilirubin Urine NEGATIVE  NEGATIVE   Ketones, ur NEGATIVE  NEGATIVE mg/dL   Specific Gravity, Urine >=1.030  1.005 - 1.030   Hgb urine dipstick NEGATIVE  NEGATIVE   pH 5.5  5.0 - 8.0   Protein, ur NEGATIVE  NEGATIVE mg/dL   Urobilinogen, UA 0.2  0.0 - 1.0 mg/dL   Nitrite NEGATIVE  NEGATIVE   Leukocytes, UA NEGATIVE  NEGATIVE   Assessment   The primary encounter diagnosis was RUQ pain. A diagnosis of Low back pain without sciatica, unspecified back pain laterality was also pertinent to this visit.  Differential diagnosis includes diverticulitis, ulcer disease, irritable bowel syndrome, pancreatitis, gallbladder or liver disease, or inflammatory bowel disease. I think she would benefit from an in-hospital workup.  Plan     The patient was transferred to the ED  via shuttle in stable condition.  Medical Decision Making:  58 year old female presents with a 4 day history of pain in peri umbilical area and RUQ.  Also  Has pain in lower back.  No fever, chills, nausea, vomiting, diarrhea or urinary symptoms.  On exam she is afebrile and in no distress.  Abdominal exam reveals tenderness to palpation in RUQ and peri-umbilical area without peritoneal signs and normal bowel sounds.  UA was normal.  I am concerned about  diverticulitis and feel she needs an abdominal CT.     Harden Mo, MD 08/29/13 [place], MD 08/29/13 1004

## 2013-08-29 NOTE — ED Notes (Addendum)
Pt reports RUQ pain for 3 days and lower back pain (for 6 months); a/w decreased appetite; pt sent from [facility]- u/a done at [facility] was normal

## 2013-08-29 NOTE — ED Notes (Signed)
C/o lower back pain and RUQ abd pain for three days States abd area is tender to touch Hx of sx in July and was prescribed prednisone for 12 days States she has not been able to sleep Does have pain around [employer]

## 2013-09-05 ENCOUNTER — Ambulatory Visit (INDEPENDENT_AMBULATORY_CARE_PROVIDER_SITE_OTHER): Payer: Federal, State, Local not specified - PPO | Admitting: Internal Medicine

## 2013-09-05 ENCOUNTER — Encounter: Payer: Self-pay | Admitting: Internal Medicine

## 2013-09-05 VITALS — BP 140/82 | Temp 98.4°F | Ht 66.0 in | Wt 264.0 lb

## 2013-09-05 DIAGNOSIS — K7689 Other specified diseases of liver: Secondary | ICD-10-CM

## 2013-09-05 DIAGNOSIS — R609 Edema, unspecified: Secondary | ICD-10-CM

## 2013-09-05 DIAGNOSIS — M545 Low back pain, unspecified: Secondary | ICD-10-CM

## 2013-09-05 DIAGNOSIS — K429 Umbilical hernia without obstruction or gangrene: Secondary | ICD-10-CM

## 2013-09-05 DIAGNOSIS — Z87442 Personal history of urinary calculi: Secondary | ICD-10-CM

## 2013-09-05 DIAGNOSIS — K760 Fatty (change of) liver, not elsewhere classified: Secondary | ICD-10-CM

## 2013-09-05 DIAGNOSIS — N281 Cyst of kidney, acquired: Secondary | ICD-10-CM

## 2013-09-05 DIAGNOSIS — I10 Essential (primary) hypertension: Secondary | ICD-10-CM

## 2013-09-05 DIAGNOSIS — Q619 Cystic kidney disease, unspecified: Secondary | ICD-10-CM

## 2013-09-05 MED ORDER — MELOXICAM 15 MG PO TABS: 15.0000 mg | ORAL_TABLET | Freq: Every day | ORAL | Status: DC

## 2013-09-05 NOTE — Patient Instructions (Addendum)
Add antiinflammatory.   fo rthe next 2 weeks to evaluate. Take  With food . To avoid stomach upset.   Will get urology   to see your scan and you  But I done think the cysts are the cause . If  persistent or progressive consider seeing  Rheumatologist and get some type of imaging study.  Repeat bp was 140/ 82   ROV or contact us in 2-3 weeks about how you are doing and make plan at that time.

## 2013-09-05 NOTE — Progress Notes (Signed)
Pre visit review using our clinic review tool, if applicable. No additional management support is needed unless otherwise documented below in the visit note.  Chief Complaint  Patient presents with  . Follow-up    Continues to have back pain.  About the same as it was.  Was seen in UC and ED last Thursday.  Would also like to discuss ankle swelling.    HPI: Alicia [name] for fu on ofgoing sx and ed visit for ruq abd panin in some. Umbilical discomfort. Had full valuation and with ct without IV contrast showing fatty liver other negative  Poss ibs sx . Very small umbilical hernia with fatty they're also renal cysts noted that may be stable versus increased on the right side report system is probably benign. However patient asked if could be causing any of the symptoms. Low back pain into the right  Had been seen7 15 for  lbp and given pred in July  Prednisone   Helped the back pain.    Complete resolution and came back after 2-3 days  . pred helped the swelling and came back. Legs and hands . bp was up  Today ok in ed .   ROS: See pertinent positives and negatives per HPI. No fever cp sob. Back pain apparently is persistent not particularly associated with acidic given activity concerned because school year starting.  Past Medical History  Diagnosis Date  . Palpitations   . Varicose veins   . Near syncope   . Chronic tension headaches   . Nephrolithiasis     history of  passed on own.   Marland Kitchen Hypertension   . Allergy   . Hx of varicella     Family History  Problem Relation Age of Onset  . Cancer Mother     CERVICAL uterin cancer   . Liver disease Mother     from chronic heaptitis   . Heart disease Father     avdisease avr  . Stroke Father   . Cancer Sister     Ovarian and endometrial  . Cancer Maternal Aunt     Colon  . Heart disease Maternal Grandfather   . Heart disease Paternal Grandmother   . Heart disease Paternal Grandfather   . Diabetes Mother   . Diabetes  Sister   . Diabetes Brother     History   Social History  . Marital Status: Married    Spouse Name: N/A    Number of Children: 3  . Years of Education: 16   Occupational History  . teacher [employer]   Social History Main Topics  . Smoking status: Never Smoker   . Smokeless tobacco: Never Used  . Alcohol Use: No  . Drug Use: No  . Sexual Activity: Yes    Partners: Male   Other Topics Concern  . None   Social History Narrative   [employer]- BA. 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 2 daughters- '82, '93, 1 son- '87. 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 hydrochlorothiazide (HYDRODIURIL) 25 MG tablet Take 25 mg by mouth daily.  . Multiple  Vitamin (MULTIVITAMIN WITH MINERALS) TABS tablet Take 1 tablet by mouth daily. Women's formula  . meloxicam (MOBIC) 15 MG tablet Take 1 tablet (15 mg total) by mouth daily. For 2 weeks and then as needed  . [DISCONTINUED] predniSONE (DELTASONE) 10 MG tablet Take 4 pills a day for 3 days, then 3 a day for 3 days, then 2 a day for 3 days, then 1 a day for 3 days, then stop    EXAM:  BP 140/82  Temp(Src) 98.4 F (36.9 C) (Oral)  Ht 5\' 6"  (1.676 m)  Wt 264 lb (119.75 kg)  BMI 42.63 kg/m2  Body mass index is 42.63 kg/(m^2).  GENERAL: vitals reviewed and listed above, alert, oriented, appears well hydrated and in no acute distress looks a bit tired HEENT: atraumatic, conjunctiva  clear, no obvious abnormalities on inspection of external nose and ears OP : no lesion edema or exudate  NECK: no obvious masses on inspection palpation  LUNGS: clear to auscultation bilaterally, no wheezes, rales or rhonchi, good air movement CV: HRRR, no  clubbing cyanosis or  peripheral edema nl cap refill  Back points to lumbosacral area and to the right as area of discomfort MS: moves all extremities without noticeable focal  abnormality +1 edema no redness or streaking See ED visit. Lab Results  Component Value Date   WBC 4.7 08/29/2013   HGB 13.7 08/29/2013   HCT 40.9 08/29/2013   PLT 206 08/29/2013   GLUCOSE 100* 08/29/2013   CHOL 195 07/10/2013   TRIG 143.0 07/10/2013   HDL 58.40 07/10/2013   LDLCALC 108* 07/10/2013   ALT 25 08/29/2013   AST 26 08/29/2013   NA 144 08/29/2013   K 3.9 08/29/2013   CL 107 08/29/2013   CREATININE 0.65 08/29/2013   BUN 13 08/29/2013   CO2 24 08/29/2013   TSH 0.48 07/10/2013   INR 1.0 ratio 04/03/2009    ASSESSMENT AND PLAN:  Discussed the following assessment and plan:  Midline low back pain without sciatica - atypical  for mechanichal. if not controlled considder plain x ray and or referral to rheumatology . - Plan: Ambulatory referral to Urology  Essential hypertension, benign  Renal cyst - on ct  - Plan: Ambulatory referral to Urology  NEPHROLITHIASIS, HX OF - Plan: Ambulatory referral to Urology  Umbilical hernia without obstruction and without gangrene - small with fat  fu if sx  doesnt expain all of the other sx   Fatty infiltration of liver - on CT scan   Edema - not a new finding. seems separate from other  problems Low back pain constantly   Still an issues and wakes her up when turns. Otherwise no associated symptoms except possibly radiation to the right side. Interesting of her dramatic improvement with prednisone and recurrence off. Cannot explain all of the sx with one diagnosis. Back pain sounds inflammatory because of the isg response "resolution" with pred and  No sig aggravates or mitigators  By hx   Mid line and to the right  Doubt if renal cyst the probvlem will get uro to opine. Because of the size of patient concern in her history of renal stones. Small umib hernia : if getting  periabd pain plan surgical consult  May need to be  Fixed . Swelling  Off an on has had in past  Doesn't correlate with  Abobve. Apparently improved with the prednisone at this point would not add another diuretic there is a risk with meloxicam but will  followup. Blood pressure has been controlled up today.  -Patient advised to return or notify health care team  if symptoms worsen ,persist or new concerns arise.  Patient Instructions  Add antiinflammatory.   fo rthe next 2 weeks to evaluate. Take  With food . To avoid stomach upset.   Will get urology   to see your scan and you  But I done think the cysts are the cause . If  persistent or progressive consider seeing  Rheumatologist and get some type of imaging study.  Repeat bp was 140/ 82   ROV or contact us in 2-3 weeks about how you are doing and make plan at that time.    Standley Brooking. Callen Zuba M.D.

## 2013-09-06 ENCOUNTER — Telehealth: Payer: Self-pay | Admitting: Internal Medicine

## 2013-09-06 NOTE — Telephone Encounter (Signed)
Relevant patient education assigned to patient using Emmi. ° °

## 2013-11-18 ENCOUNTER — Encounter: Payer: Self-pay | Admitting: Internal Medicine

## 2014-01-30 ENCOUNTER — Ambulatory Visit
Admission: RE | Admit: 2014-01-30 | Discharge: 2014-01-30 | Disposition: A | Discharge status: Home / Self Care | Payer: Federal, State, Local not specified - PPO | Source: Ambulatory Visit

## 2014-01-30 ENCOUNTER — Ambulatory Visit: Payer: Federal, State, Local not specified - PPO

## 2014-01-30 DIAGNOSIS — Z1231 Encounter for screening mammogram for malignant neoplasm of breast: Secondary | ICD-10-CM

## 2014-02-11 ENCOUNTER — Ambulatory Visit (INDEPENDENT_AMBULATORY_CARE_PROVIDER_SITE_OTHER): Payer: Federal, State, Local not specified - PPO | Admitting: Internal Medicine

## 2014-02-11 VITALS — BP 140/86 | Temp 97.8°F | Ht 66.75 in | Wt 257.1 lb

## 2014-02-11 DIAGNOSIS — R109 Unspecified abdominal pain: Secondary | ICD-10-CM

## 2014-02-11 DIAGNOSIS — M549 Dorsalgia, unspecified: Secondary | ICD-10-CM

## 2014-02-11 DIAGNOSIS — R197 Diarrhea, unspecified: Secondary | ICD-10-CM

## 2014-02-11 LAB — CBC WITH DIFFERENTIAL/PLATELET
Basophils Absolute: 0 10*3/uL (ref 0.0–0.1)
Basophils Relative: 0.5 % (ref 0.0–3.0)
Eosinophils Absolute: 0.2 10*3/uL (ref 0.0–0.7)
Eosinophils Relative: 2.9 % (ref 0.0–5.0)
HCT: 42 % (ref 36.0–46.0)
Hemoglobin: 14.6 g/dL (ref 12.0–15.0)
Lymphocytes Relative: 26.9 % (ref 12.0–46.0)
Lymphs Abs: 1.5 10*3/uL (ref 0.7–4.0)
MCHC: 34.8 g/dL (ref 30.0–36.0)
MCV: 89.7 fl (ref 78.0–100.0)
Monocytes Absolute: 0.6 10*3/uL (ref 0.1–1.0)
Monocytes Relative: 11.4 % (ref 3.0–12.0)
Neutro Abs: 3.2 10*3/uL (ref 1.4–7.7)
Neutrophils Relative %: 58.3 % (ref 43.0–77.0)
Platelets: 218 10*3/uL (ref 150.0–400.0)
RBC: 4.69 Mil/uL (ref 3.87–5.11)
RDW: 12.4 % (ref 11.5–15.5)
WBC: 5.6 10*3/uL (ref 4.0–10.5)

## 2014-02-11 LAB — POCT URINALYSIS DIPSTICK
Bilirubin, UA: 1
Blood, UA: NEGATIVE
Glucose, UA: NEGATIVE
Leukocytes, UA: NEGATIVE
Nitrite, UA: NEGATIVE
Protein, UA: 1
Spec Grav, UA: >=1.03
Urobilinogen, UA: 1
pH, UA: 5.5

## 2014-02-11 LAB — HEPATIC FUNCTION PANEL
ALT: 22 U/L (ref 0–35)
AST: 25 U/L (ref 0–37)
Albumin: 4 g/dL (ref 3.5–5.2)
Alkaline Phosphatase: 74 U/L (ref 39–117)
Bilirubin, Direct: 0.1 mg/dL (ref 0.0–0.3)
Total Bilirubin: 1 mg/dL (ref 0.2–1.2)
Total Protein: 7.3 g/dL (ref 6.0–8.3)

## 2014-02-11 LAB — BASIC METABOLIC PANEL
BUN: 17 mg/dL (ref 6–23)
CO2: 29 mEq/L (ref 19–32)
Calcium: 9.6 mg/dL (ref 8.4–10.5)
Chloride: 107 mEq/L (ref 96–112)
Creatinine, Ser: 0.69 mg/dL (ref 0.40–1.20)
GFR: 92.91 mL/min (ref >60.00)
Glucose, Bld: 103 mg/dL — ABNORMAL HIGH (ref 70–99)
Potassium: 4 mEq/L (ref 3.5–5.1)
Sodium: 143 mEq/L (ref 135–145)

## 2014-02-11 LAB — C-REACTIVE PROTEIN: CRP: 0.7 mg/dL (ref 0.5–20.0)

## 2014-02-11 NOTE — Patient Instructions (Signed)
Diarrhea most likely infection that should run its course but   Pain  right flank not typical Fluids to avoid dehydration  Light diet .  Stool and blood tests   Plan fu if  persistent or progressive  In a week  Or depending on labs .  consider seeing Gi specialist  If ongoing.

## 2014-02-11 NOTE — Progress Notes (Signed)
Pre visit review using our clinic review tool, if applicable. No additional management support is needed unless otherwise documented below in the visit note.   Chief Complaint  Patient presents with  . Flank Pain    Rt side.  Started last Wednesday  . Back Pain  . Nausea  . Diarrhea    HPI: Patient Alicia [name]  comes in today for SDA for  new problem evaluation. Onset last Wednesday after" school lunch" nausea  And then diarrhea  Loose yellow small pieces  About 3- 4 x per day.  explosove .  No blood . Then tired ,sleeping, achy then right side pain since then right side and back and in right navel area .  Persistent since then and interemettenty nausea .   No fever.  Not typical of   Hx of flank pain in past but different  ROS: See pertinent positives and negatives per HPI  No hematuria syncope rash hx of right side pain in past   No dx .  Has had choley  Past Medical History  Diagnosis Date  . Palpitations   . Varicose veins   . Near syncope   . Chronic tension headaches   . Nephrolithiasis     history of  passed on own.   Marland Kitchen Hypertension   . Allergy   . Hx of varicella     Family History  Problem Relation Age of Onset  . Cancer Mother     CERVICAL uterin cancer   . Liver disease Mother     from chronic heaptitis   . Heart disease Father     avdisease avr  . Stroke Father   . Cancer Sister     Ovarian and endometrial  . Cancer Maternal Aunt     Colon  . Heart disease Maternal Grandfather   . Heart disease Paternal Grandmother   . Heart disease Paternal Grandfather   . Diabetes Mother   . Diabetes Sister   . Diabetes Brother     History   Social History  . Marital Status: Married    Spouse Name: N/A    Number of Children: 3  . Years of Education: 16   Occupational History  . teacher [employer]   Social History Main Topics  . Smoking status: Never Smoker   . Smokeless tobacco: Never Used  . Alcohol Use: No  . Drug Use: No  .  Sexual Activity:    Partners: Male   Other Topics Concern  . Not on file   Social History Narrative   [employer]- BA. 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 2 daughters- '82, '93, 1 son- '87. 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 hydrochlorothiazide (HYDRODIURIL) 25 MG tablet Take 25 mg by mouth daily.  . meloxicam (MOBIC) 15 MG tablet Take 1 tablet (15 mg total) by mouth daily. For 2 weeks and then as needed  . Multiple Vitamin (MULTIVITAMIN WITH MINERALS) TABS tablet Take 1 tablet by mouth daily. Women's formula    EXAM:  BP 140/86 mmHg  Temp(Src) 97.8 F (36.6 C) (Oral)  Ht 5'  6.75" (1.695 m)  Wt 257 lb 1.6 oz (116.62 kg)  BMI 40.59 kg/m2  Body mass index is 40.59 kg/(m^2).  GENERAL: vitals reviewed and listed above, alert, oriented, appears well hydrated and in no acute distressnon toxic mild discomfort  HEENT: atraumatic, conjunctiva  clear, no obvious abnormalities on inspection of external nose and ears NECK: no obvious masses on inspection palpation  LUNGS: clear to auscultation bilaterally, no wheezes, rales or rhonchi, good air movement CV: HRRR, no clubbing cyanosis or  peripheral edema nl cap refill  Abdomen:  Sof,t normal bowel sounds without hepatosplenomegaly, no guarding rebound or masses no CVA tenderness tenderness is right upper tolateral to lower back   Neg psoas sign . MS: moves all extremities without noticeable focal  abnormality PSYCH: pleasant and cooperative, no obvious depression or anxiety ua neg wbc neg blood   ASSESSMENT AND PLAN:  Discussed the following assessment and plan:  Diarrhea - Plan: Basic metabolic panel, CBC with Differential/Platelet, Hepatic  function panel, Stool culture, Fecal lactoferrin, Giardia/cryptosporidium (EIA), C-reactive protein  Flank pain - Plan: POC Urinalysis Dipstick, Basic metabolic panel, CBC with Differential/Platelet, Hepatic function panel, Stool culture, Fecal lactoferrin, Giardia/cryptosporidium (EIA), C-reactive protein  Back pain, unspecified location - Plan: POC Urinalysis Dipstick, Basic metabolic panel, CBC with Differential/Platelet, Hepatic function panel, Stool culture, Fecal lactoferrin, Giardia/cryptosporidium (EIA), C-reactive protein Acts like ge but pain is not typical   Has had gb out   Check labs cbc lfts etc. Observation and  Fu if .pop and referral see  instructions -Patient advised to return or notify health care team  if symptoms worsen ,persist or new concerns arise.  Patient Instructions  Diarrhea most likely infection that should run its course but   Pain  right flank not typical Fluids to avoid dehydration  Light diet .  Stool and blood tests   Plan fu if  persistent or progressive  In a week  Or depending on labs .  consider seeing Gi specialist  If ongoing.     Standley Brooking. Panosh M.D.   Lab Results  Component Value Date   WBC 5.6 02/11/2014   HGB 14.6 02/11/2014   HCT 42.0 02/11/2014   PLT 218.0 02/11/2014   GLUCOSE 103* 02/11/2014   CHOL 195 07/10/2013   TRIG 143.0 07/10/2013   HDL 58.40 07/10/2013   LDLCALC 108* 07/10/2013   ALT 22 02/11/2014   AST 25 02/11/2014   NA 143 02/11/2014   K 4.0 02/11/2014   CL 107 02/11/2014   CREATININE 0.69 02/11/2014   BUN 17 02/11/2014   CO2 29 02/11/2014   TSH 0.48 07/10/2013   INR 1.0 ratio 04/03/2009

## 2014-02-12 ENCOUNTER — Ambulatory Visit: Payer: Federal, State, Local not specified - PPO | Admitting: Internal Medicine

## 2014-02-13 ENCOUNTER — Encounter: Payer: Self-pay | Admitting: Internal Medicine

## 2014-02-17 LAB — HM COLONOSCOPY

## 2014-02-18 LAB — FECAL LACTOFERRIN, QUANT: Lactoferrin: NEGATIVE

## 2014-02-18 LAB — GIARDIA/CRYPTOSPORIDIUM (EIA)
Cryptosporidium Screen (EIA): NEGATIVE
Giardia Screen (EIA): NEGATIVE

## 2014-02-19 ENCOUNTER — Telehealth: Payer: Self-pay | Admitting: Family Medicine

## 2014-02-19 DIAGNOSIS — R197 Diarrhea, unspecified: Secondary | ICD-10-CM

## 2014-02-19 DIAGNOSIS — R109 Unspecified abdominal pain: Secondary | ICD-10-CM

## 2014-02-19 NOTE — Telephone Encounter (Signed)
Pt would like to proceed with gi referral.  Please advise.  Thanks!

## 2014-02-19 NOTE — Telephone Encounter (Signed)
Please refer to gi  Dx diarrhea and right side pain  tellpatient we are going to do this

## 2014-02-20 ENCOUNTER — Telehealth: Payer: Self-pay | Admitting: Internal Medicine

## 2014-02-20 DIAGNOSIS — R197 Diarrhea, unspecified: Secondary | ICD-10-CM

## 2014-02-20 DIAGNOSIS — R109 Unspecified abdominal pain: Secondary | ICD-10-CM

## 2014-02-20 NOTE — Telephone Encounter (Signed)
Pt would like referral to [facility] endoscopy center, dr Jenny Reichmann hayes/ pt has been there before and they have her records previous referral was placed for [facility], pt does not want to go there.

## 2014-02-20 NOTE — Telephone Encounter (Signed)
Pt has already been notified.  Order placed in the system.

## 2014-02-21 NOTE — Telephone Encounter (Signed)
New referral place in the system.

## 2014-02-22 LAB — STOOL CULTURE

## 2014-02-25 ENCOUNTER — Encounter: Payer: Self-pay | Admitting: Family Medicine

## 2014-02-26 ENCOUNTER — Encounter: Payer: Self-pay | Admitting: Family Medicine

## 2014-08-29 ENCOUNTER — Emergency Department (HOSPITAL_COMMUNITY)
Admission: EM | Admit: 2014-08-29 | Discharge: 2014-08-29 | Discharge status: Against Medical Advice | Payer: Federal, State, Local not specified - PPO | Attending: Emergency Medicine | Admitting: Emergency Medicine

## 2014-08-29 ENCOUNTER — Encounter (HOSPITAL_COMMUNITY): Payer: Self-pay

## 2014-08-29 DIAGNOSIS — I10 Essential (primary) hypertension: Secondary | ICD-10-CM | POA: Diagnosis not present

## 2014-08-29 DIAGNOSIS — M79602 Pain in left arm: Secondary | ICD-10-CM | POA: Insufficient documentation

## 2014-08-29 DIAGNOSIS — G8929 Other chronic pain: Secondary | ICD-10-CM | POA: Insufficient documentation

## 2014-08-29 DIAGNOSIS — M542 Cervicalgia: Secondary | ICD-10-CM | POA: Insufficient documentation

## 2014-08-29 DIAGNOSIS — M25512 Pain in left shoulder: Secondary | ICD-10-CM | POA: Diagnosis present

## 2014-08-29 HISTORY — DX: Calculus of kidney: N20.0

## 2014-08-29 NOTE — ED Notes (Signed)
Pt called to room; no response.

## 2014-08-29 NOTE — ED Notes (Signed)
Patient c/o intermittent left shoulder pain that radiates into the left lateral neck and down the left arm x a few months. Patient states the pain has worsened in the past few days. Patient has been taking Tylenol with some relief.

## 2014-09-01 ENCOUNTER — Telehealth: Payer: Self-pay | Admitting: Internal Medicine

## 2014-09-01 ENCOUNTER — Emergency Department (HOSPITAL_COMMUNITY): Payer: Federal, State, Local not specified - PPO

## 2014-09-01 ENCOUNTER — Emergency Department (HOSPITAL_COMMUNITY)
Admission: EM | Admit: 2014-09-01 | Discharge: 2014-09-01 | Disposition: A | Discharge status: Home / Self Care | Payer: Federal, State, Local not specified - PPO | Attending: Emergency Medicine | Admitting: Emergency Medicine

## 2014-09-01 ENCOUNTER — Encounter (HOSPITAL_COMMUNITY): Payer: Self-pay | Admitting: Family Medicine

## 2014-09-01 ENCOUNTER — Other Ambulatory Visit: Payer: Self-pay | Admitting: Obstetrics and Gynecology

## 2014-09-01 DIAGNOSIS — G8929 Other chronic pain: Secondary | ICD-10-CM | POA: Insufficient documentation

## 2014-09-01 DIAGNOSIS — Z880 Allergy status to penicillin: Secondary | ICD-10-CM | POA: Diagnosis not present

## 2014-09-01 DIAGNOSIS — R202 Paresthesia of skin: Secondary | ICD-10-CM | POA: Diagnosis not present

## 2014-09-01 DIAGNOSIS — I10 Essential (primary) hypertension: Secondary | ICD-10-CM | POA: Insufficient documentation

## 2014-09-01 DIAGNOSIS — N644 Mastodynia: Secondary | ICD-10-CM

## 2014-09-01 DIAGNOSIS — Z79899 Other long term (current) drug therapy: Secondary | ICD-10-CM | POA: Insufficient documentation

## 2014-09-01 DIAGNOSIS — Z87442 Personal history of urinary calculi: Secondary | ICD-10-CM | POA: Diagnosis not present

## 2014-09-01 DIAGNOSIS — Z87448 Personal history of other diseases of urinary system: Secondary | ICD-10-CM | POA: Insufficient documentation

## 2014-09-01 DIAGNOSIS — Z8619 Personal history of other infectious and parasitic diseases: Secondary | ICD-10-CM | POA: Insufficient documentation

## 2014-09-01 DIAGNOSIS — R079 Chest pain, unspecified: Secondary | ICD-10-CM | POA: Diagnosis present

## 2014-09-01 DIAGNOSIS — R51 Headache: Secondary | ICD-10-CM | POA: Diagnosis not present

## 2014-09-01 DIAGNOSIS — R519 Headache, unspecified: Secondary | ICD-10-CM

## 2014-09-01 DIAGNOSIS — M79609 Pain in unspecified limb: Secondary | ICD-10-CM

## 2014-09-01 DIAGNOSIS — M79603 Pain in arm, unspecified: Secondary | ICD-10-CM

## 2014-09-01 LAB — CBC
HCT: 42.7 % (ref 36.0–46.0)
Hemoglobin: 14.6 g/dL (ref 12.0–15.0)
MCH: 31.3 pg (ref 26.0–34.0)
MCHC: 34.2 g/dL (ref 30.0–36.0)
MCV: 91.4 fL (ref 78.0–100.0)
Platelets: 194 10*3/uL (ref 150–400)
RBC: 4.67 MIL/uL (ref 3.87–5.11)
RDW: 12.3 % (ref 11.5–15.5)
WBC: 6.3 10*3/uL (ref 4.0–10.5)

## 2014-09-01 LAB — BASIC METABOLIC PANEL
Anion gap: 10 (ref 5–15)
BUN: 13 mg/dL (ref 6–20)
CO2: 24 mmol/L (ref 22–32)
Calcium: 9.1 mg/dL (ref 8.9–10.3)
Chloride: 106 mmol/L (ref 101–111)
Creatinine, Ser: 0.61 mg/dL (ref 0.44–1.00)
GFR calc Af Amer: >60 mL/min (ref >60)
GFR calc non Af Amer: >60 mL/min (ref >60)
Glucose, Bld: 94 mg/dL (ref 65–99)
Potassium: 3.8 mmol/L (ref 3.5–5.1)
Sodium: 140 mmol/L (ref 135–145)

## 2014-09-01 LAB — I-STAT TROPONIN, ED: Troponin i, poc: 0 ng/mL (ref 0.00–0.08)

## 2014-09-01 NOTE — ED Notes (Signed)
Pt is in stable condition upon d/c and ambulates from ED. 

## 2014-09-01 NOTE — ED Notes (Signed)
Pt here for what started as numbness in left breast and has progressed to left arm pain and chest pain over the past few days. sts she is under a lot of stress taking care of her father.

## 2014-09-01 NOTE — Telephone Encounter (Signed)
Pt was seen at [facility] today for left arm/shoulder pain. Pt needs post er follow. Pt also need to discuss getting a stress test. Can I create 30 min slot if so when?

## 2014-09-01 NOTE — ED Provider Notes (Signed)
CSN: [id number]     Arrival date & time 09/01/14  1242 History   First MD Initiated Contact with Patient 09/01/14 1503     Chief Complaint  Patient presents with  . Chest Pain  . Arm Pain  . Numbness     (Consider location/radiation/quality/duration/timing/severity/associated sxs/prior Treatment) Patient is a 59 y.o. female presenting with chest pain and arm pain.  Chest Pain Pain location:  L chest Pain quality comment:  Tingling "pins and needles" Pain radiates to:  L shoulder Pain radiates to the back: no   Pain severity:  Mild Onset quality:  Gradual Duration:  1 month Timing:  Intermittent Progression:  Worsening (over last week) Chronicity:  New Context: at rest   Relieved by:  Nothing Worsened by:  Nothing tried Associated symptoms: no cough, no fever, no shortness of breath, no syncope, not vomiting and no weakness   Risk factors: obesity   Arm Pain Associated symptoms include chest pain. Pertinent negatives include no shortness of breath.    Past Medical History  Diagnosis Date  . Palpitations   . Varicose veins   . Near syncope   . Chronic tension headaches   . Nephrolithiasis     history of  passed on own.   Marland Kitchen Hypertension   . Allergy   . Hx of varicella   . Kidney stones    Past Surgical History  Procedure Laterality Date  . Abdominal hysterectomy      fibroids non cancer   . Cholecystectomy    . Tonsillectomy    . Cataract surgery Right 03-02-2012  . Cataract extraction      Bilateral   Family History  Problem Relation Age of Onset  . Cancer Mother     CERVICAL uterin cancer   . Liver disease Mother     from chronic heaptitis   . Heart disease Father     avdisease avr  . Stroke Father   . Cancer Sister     Ovarian and endometrial  . Cancer Maternal Aunt     Colon  . Heart disease Maternal Grandfather   . Heart disease Paternal Grandmother   . Heart disease Paternal Grandfather   . Diabetes Mother   . Diabetes Sister   . Diabetes  Brother    Social History  Substance Use Topics  . Smoking status: Never Smoker   . Smokeless tobacco: Never Used  . Alcohol Use: No   OB History    Gravida Para Term Preterm AB TAB SAB Ectopic Multiple Living   3 3 3             Review of Systems  Constitutional: Negative for fever.  Respiratory: Negative for cough and shortness of breath.   Cardiovascular: Positive for chest pain. Negative for syncope.  Gastrointestinal: Negative for vomiting.  Neurological: Negative for weakness.  All other systems reviewed and are negative.     Allergies  Contrast media; Iohexol; Morphine; Ciprofloxacin; Penicillins; Sulfamethoxazole-trimethoprim; and Sulfonamide derivatives  Home Medications   Prior to Admission medications   Medication Sig Start Date End Date Taking? Authorizing Provider  acetaminophen (TYLENOL) 500 MG tablet Take 500 mg by mouth every 6 (six) hours as needed for mild pain or headache.   Yes Historical Provider, MD  Multiple Vitamin (MULTIVITAMIN WITH MINERALS) TABS tablet Take 1 tablet by mouth daily. Women's formula   Yes Historical Provider, MD  hydrochlorothiazide (HYDRODIURIL) 25 MG tablet Take 25 mg by mouth daily.    Historical Provider, MD  meloxicam (MOBIC) 15 MG tablet Take 1 tablet (15 mg total) by mouth daily. For 2 weeks and then as needed Patient not taking: Reported on 09/01/2014 09/05/13   Burnis Medin, MD   BP 150/85 mmHg  Pulse 58  Temp(Src) 98.1 F (36.7 C) (Oral)  Resp 16  Ht 5\' 6"  (1.676 m)  Wt 250 lb (113.399 kg)  BMI 40.37 kg/m2  SpO2 98% Physical Exam  Constitutional: She is oriented to person, place, and time. She appears well-developed and well-nourished. No distress.  HENT:  Head: Normocephalic.  Eyes: Conjunctivae are normal.  Neck: Neck supple. No tracheal deviation present.  Cardiovascular: Normal rate, regular rhythm and normal heart sounds.   Pulmonary/Chest: Effort normal and breath sounds normal. No respiratory distress.   Abdominal: Soft. She exhibits no distension.  Neurological: She is alert and oriented to person, place, and time.  Skin: Skin is warm and dry.  Psychiatric: She has a normal mood and affect.    ED Course  Procedures (including critical care time) Labs Review Labs Reviewed  BASIC METABOLIC PANEL  [place], ED    Imaging Review No results found. Lucilla Edin, personally reviewed and evaluated these images and lab results as part of my medical decision-making.   EKG Interpretation   Date/Time:  Monday September 01 2014 12:50:04 EDT Ventricular Rate:  69 PR Interval:  148 QRS Duration: 90 QT Interval:  402 QTC Calculation: 430 R Axis:   26 Text Interpretation:  Normal sinus rhythm Nonspecific ST and T wave  abnormality present on prior study from 07-27-1998 Confirmed by Clemencia Helzer MD,  Rechel Delosreyes [phone]) on 09/01/2014 3:08:55 PM      MDM   Final diagnoses:  Left sided chest pain  Paresthesia and pain of left extremity  Increased frequency of headaches    59 year old female presents with atypical left shoulder pain/chest pain that has been constant throughout the past week. She's been taking Tylenol with some relief of her symptoms, she says it initially started as pins and needles under her left breast. Her EKG has T-wave abnormalities that were present remotely, she has no acute ischemic findings, her troponin is negative here despite several days of ongoing symptoms, I recommended that she continue to follow with her primary care physician and referred to cardiology as needed but given the distribution of her pain it appears noncardiac at this time. She can continue supportive care measures of her headache and she states that it is resolved currently. Return precautions were discussed for any pressure or pain over her chest. Plan to follow up with PCP as needed and return precautions discussed for worsening or new concerning symptoms.     Leo Grosser, MD 09/01/14  [phone]

## 2014-09-01 NOTE — ED Notes (Signed)
Pt has been having "pins and needles"-- left breast area, with dull pain in left arm. Saw OB GYN -- sent to primary care who sent here.

## 2014-09-01 NOTE — Telephone Encounter (Signed)
Left message on voicemail for patient to call back. 

## 2014-09-01 NOTE — Discharge Instructions (Signed)
Paresthesia Paresthesia is an abnormal burning or prickling sensation. This sensation is generally felt in the hands, arms, legs, or feet. However, it may occur in any part of the body. It is usually not painful. The feeling may be described as:  Tingling or numbness.  "Pins and needles."  Skin crawling.  Buzzing.  Limbs "falling asleep."  Itching. Most people experience temporary (transient) paresthesia at some time in their lives. CAUSES  Paresthesia may occur when you breathe too quickly (hyperventilation). It can also occur without any apparent cause. Commonly, paresthesia occurs when pressure is placed on a nerve. The feeling quickly goes away once the pressure is removed. For some people, however, paresthesia is a long-lasting (chronic) condition caused by an underlying disorder. The underlying disorder may be:  A traumatic, direct injury to nerves. Examples include a:  Broken (fractured) neck.  Fractured skull.  A disorder affecting the brain and spinal cord (central nervous system). Examples include:  Transverse myelitis.  Encephalitis.  Transient ischemic attack.  Multiple sclerosis.  Stroke.  Tumor or blood vessel problems, such as an arteriovenous malformation pressing against the brain or spinal cord.  A condition that damages the peripheral nerves (peripheral neuropathy). Peripheral nerves are not part of the brain and spinal cord. These conditions include:  Diabetes.  Peripheral vascular disease.  Nerve entrapment syndromes, such as carpal tunnel syndrome.  Shingles.  Hypothyroidism.  Vitamin B12 deficiencies.  Alcoholism.  Heavy metal poisoning (lead, arsenic).  Rheumatoid arthritis.  Systemic lupus erythematosus. DIAGNOSIS  Your caregiver will attempt to find the underlying cause of your paresthesia. Your caregiver may:  Take your medical history.  Perform a physical exam.  Order various lab tests.  Order imaging tests. TREATMENT    Treatment for paresthesia depends on the underlying cause. HOME CARE INSTRUCTIONS  Avoid drinking alcohol.  You may consider massage or acupuncture to help relieve your symptoms.  Keep all follow-up appointments as directed by your caregiver. SEEK IMMEDIATE MEDICAL CARE IF:   You feel weak.  You have trouble walking or moving.  You have problems with speech or vision.  You feel confused.  You cannot control your bladder or bowel movements.  You feel numbness after an injury.  You faint.  Your burning or prickling feeling gets worse when walking.  You have pain, cramps, or dizziness.  You develop a rash. MAKE SURE YOU:  Understand these instructions.  Will watch your condition.  Will get help right away if you are not doing well or get worse. Document Released: 12/24/2001 Document Revised: 03/28/2011 Document Reviewed: 09/24/2010 [facility] Patient Information 2015 [place], [place]. This information is not intended to replace advice given to you by your health care provider. Make sure you discuss any questions you have with your health care provider. Chest Pain (Nonspecific) It is often hard to give a specific diagnosis for the cause of chest pain. There is always a chance that your pain could be related to something serious, such as a heart attack or a blood clot in the lungs. You need to follow up with your health care provider for further evaluation. CAUSES   Heartburn.  Pneumonia or bronchitis.  Anxiety or stress.  Inflammation around your heart (pericarditis) or lung (pleuritis or pleurisy).  A blood clot in the lung.  A collapsed lung (pneumothorax). It can develop suddenly on its own (spontaneous pneumothorax) or from trauma to the chest.  Shingles infection (herpes zoster virus). The chest wall is composed of bones, muscles, and cartilage. Any of  these can be the source of the pain.  The bones can be bruised by injury.  The muscles or cartilage can be  strained by coughing or overwork.  The cartilage can be affected by inflammation and become sore (costochondritis). DIAGNOSIS  Lab tests or other studies may be needed to find the cause of your pain. Your health care provider may have you take a test called an ambulatory electrocardiogram (ECG). An ECG records your heartbeat patterns over a 24-hour period. You may also have other tests, such as:  Transthoracic echocardiogram (TTE). During echocardiography, sound waves are used to evaluate how blood flows through your heart.  Transesophageal echocardiogram (TEE).  Cardiac monitoring. This allows your health care provider to monitor your heart rate and rhythm in real time.  Holter monitor. This is a portable device that records your heartbeat and can help diagnose heart arrhythmias. It allows your health care provider to track your heart activity for several days, if needed.  Stress tests by exercise or by giving medicine that makes the heart beat faster. TREATMENT   Treatment depends on what may be causing your chest pain. Treatment may include:  Acid blockers for heartburn.  Anti-inflammatory medicine.  Pain medicine for inflammatory conditions.  Antibiotics if an infection is present.  You may be advised to change lifestyle habits. This includes stopping smoking and avoiding alcohol, caffeine, and chocolate.  You may be advised to keep your head raised (elevated) when sleeping. This reduces the chance of acid going backward from your stomach into your esophagus. Most of the time, nonspecific chest pain will improve within 2-3 days with rest and mild pain medicine.  HOME CARE INSTRUCTIONS   If antibiotics were prescribed, take them as directed. Finish them even if you start to feel better.  For the next few days, avoid physical activities that bring on chest pain. Continue physical activities as directed.  Do not use any tobacco products, including cigarettes, chewing tobacco, or  electronic cigarettes.  Avoid drinking alcohol.  Only take medicine as directed by your health care provider.  Follow your health care provider's suggestions for further testing if your chest pain does not go away.  Keep any follow-up appointments you made. If you do not go to an appointment, you could develop lasting (chronic) problems with pain. If there is any problem keeping an appointment, call to reschedule. SEEK MEDICAL CARE IF:   Your chest pain does not go away, even after treatment.  You have a rash with blisters on your chest.  You have a fever. SEEK IMMEDIATE MEDICAL CARE IF:   You have increased chest pain or pain that spreads to your arm, neck, jaw, back, or abdomen.  You have shortness of breath.  You have an increasing cough, or you cough up blood.  You have severe back or abdominal pain.  You feel nauseous or vomit.  You have severe weakness.  You faint.  You have chills. This is an emergency. Do not wait to see if the pain will go away. Get medical help at once. Call your local emergency services (911 in [place] Do not drive yourself to the hospital. MAKE SURE YOU:   Understand these instructions.  Will watch your condition.  Will get help right away if you are not doing well or get worse. Document Released: 10/13/2004 Document Revised: 01/08/2013 Document Reviewed: 08/09/2007 [facility] Patient Information 2015 [place], [place]. This information is not intended to replace advice given to you by your health care provider. Make sure  you discuss any questions you have with your health care provider.

## 2014-09-01 NOTE — Telephone Encounter (Signed)
Patient called and stated she is waiting for husband to arrive home and plans to go to [facility] ED. Educated patient symptoms look cardiac related and want to be safe to rule cardiac out. Patient verbalized understanding.

## 2014-09-01 NOTE — Telephone Encounter (Signed)
Patient Name: Alicia [name]  DOB: May 09, 1955    Initial Comment Caller states she has a constant headache, chest pain, pain down arm and shoulder blade    Nurse Assessment  Nurse: Leilani Merl, RN, Heather Date/Time (Eastern Time): 09/01/2014 9:32:27 AM  Confirm and document reason for call. If symptomatic, describe symptoms. ---Caller states that she has had pain and tingling down her left arm for the last few weeks, she started with left arm and shoulder pain, headaches and chest pressure on the left side over the weekend.  Has the patient traveled out of the country within the last 30 days? ---Not Applicable  Does the patient require triage? ---Yes  Related visit to physician within the last 2 weeks? ---No  Does the PT have any chronic conditions? (i.e. diabetes, asthma, etc.) ---Yes  List chronic conditions. ---palpitations, see MR     Guidelines    Guideline Title Affirmed Question Affirmed Notes  Chest Pain Pain also present in shoulder(s) or arm(s) or jaw (Exception: pain is clearly made worse by movement)    Final Disposition User   Go to ED Now [place], RN, [facility] - ED   Disagree/Comply: Comply

## 2014-09-02 NOTE — Telephone Encounter (Signed)
Ok but  Wont be able to see her until next week  ( need 30 minutes)   can refer to cardiology in the interim if she wishes.

## 2014-09-03 NOTE — Addendum Note (Signed)
Addended by: Miles Costain T on: 09/03/2014 04:57 PM   Modules accepted: Orders

## 2014-09-03 NOTE — Telephone Encounter (Signed)
Pt has been sch for 09-09-14

## 2014-09-03 NOTE — Telephone Encounter (Signed)
Referral to cardiology placed in the system.

## 2014-09-03 NOTE — Telephone Encounter (Signed)
Pt would like to proceed with getting cardiology referral

## 2014-09-03 NOTE — Telephone Encounter (Signed)
Arm pain , left sided chest pain

## 2014-09-03 NOTE — Telephone Encounter (Signed)
Need dx code

## 2014-09-04 ENCOUNTER — Other Ambulatory Visit: Payer: Federal, State, Local not specified - PPO

## 2014-09-08 ENCOUNTER — Ambulatory Visit
Admission: RE | Admit: 2014-09-08 | Discharge: 2014-09-08 | Disposition: A | Discharge status: Home / Self Care | Payer: Federal, State, Local not specified - PPO | Source: Ambulatory Visit | Attending: Obstetrics and Gynecology | Admitting: Obstetrics and Gynecology

## 2014-09-08 ENCOUNTER — Other Ambulatory Visit: Payer: Federal, State, Local not specified - PPO

## 2014-09-08 DIAGNOSIS — N644 Mastodynia: Secondary | ICD-10-CM

## 2014-09-08 LAB — HM MAMMOGRAPHY

## 2014-09-09 ENCOUNTER — Ambulatory Visit (INDEPENDENT_AMBULATORY_CARE_PROVIDER_SITE_OTHER): Payer: Federal, State, Local not specified - PPO | Admitting: Internal Medicine

## 2014-09-09 ENCOUNTER — Encounter: Payer: Self-pay | Admitting: Internal Medicine

## 2014-09-09 VITALS — BP 142/88 | Temp 98.3°F | Wt 260.8 lb

## 2014-09-09 DIAGNOSIS — R0789 Other chest pain: Secondary | ICD-10-CM

## 2014-09-09 DIAGNOSIS — M79602 Pain in left arm: Secondary | ICD-10-CM | POA: Diagnosis not present

## 2014-09-09 DIAGNOSIS — I10 Essential (primary) hypertension: Secondary | ICD-10-CM

## 2014-09-09 DIAGNOSIS — Z6379 Other stressful life events affecting family and household: Secondary | ICD-10-CM | POA: Diagnosis not present

## 2014-09-09 DIAGNOSIS — E669 Obesity, unspecified: Secondary | ICD-10-CM

## 2014-09-09 MED ORDER — VALSARTAN 80 MG PO TABS: 80.0000 mg | ORAL_TABLET | Freq: Every day | ORAL | Status: DC

## 2014-09-09 NOTE — Progress Notes (Signed)
Pre visit review using our clinic review tool, if applicable. No additional management support is needed unless otherwise documented below in the visit note.  Chief Complaint  Patient presents with  . Post ED Follow Up    HPI: Patient come in for follow up from ED visit for left sided cp  Atypical  With pins and needles feeling and left arm radiation.    "I  think this is   Stress related "  Pins and needles  Under left breast and lasted a week last time   Off and on .    Taking care of father  Metastatic prostate cancer in 81s in [place].  This summer .   The down arm and shoulder blade.  Had dx mammo an [place] .   Ed eval first time .  bp was high.   No recent  pain this week no assoic sx but was coming and going  Weeks at dads.    Was lasting about   Seems to be there continuous.  No assoc .  No weakness.     bp was up and down  179 in ed and then  Home readings  About 158/ 92  .      today 147/88   Was taking   bp med  In past  and  Did well and  lsi helped .  And was able to go off med  Had cough with one med and cramps with diuretic .   Would like to see dietician .    Not sleeping well  Stress.  School started today .  No depression  Worrying.   Has appt cardiology next month    Final diagnoses: ED visit   Left sided chest pain  Paresthesia and pain of left extremity  Increased frequency of headaches    59 year old female presents with atypical left shoulder pain/chest pain that has been constant throughout the past week. She's been taking Tylenol with some relief of her symptoms, she says it initially started as pins and needles under her left breast. Her EKG has T-wave abnormalities that were present remotely, she has no acute ischemic findings, her troponin is negative here despite several days of ongoing symptoms, I recommended that she continue to follow with her primary care physician and referred to cardiology as needed but given the distribution of her pain it  appears noncardiac at this time. She can continue supportive care measures of her headache and she states that it is resolved currently. Return precautions were discussed for any pressure or pain over her chest. Plan to follow up with PCP as needed and return precautions discussed for worsening or new concerning symptoms.     Leo Grosser, MD 09/01/14 2054           ROS: See pertinent positives and negatives per HPI. No falling  Weakness syncope ...sweatsa t night not related to sx  Hx of fall may with back issues no leg sxx  Past Medical History  Diagnosis Date  . Palpitations   . Varicose veins   . Near syncope   . Chronic tension headaches   . Nephrolithiasis     history of  passed on own.   Marland Kitchen Hypertension   . Allergy   . Hx of varicella   . Kidney stones     Family History  Problem Relation Age of Onset  . Cancer Mother     CERVICAL uterin cancer   . Liver disease Mother  from chronic heaptitis   . Heart disease Father     avdisease avr  . Stroke Father   . Cancer Sister     Ovarian and endometrial  . Cancer Maternal Aunt     Colon  . Heart disease Maternal Grandfather   . Heart disease Paternal Grandmother   . Heart disease Paternal Grandfather   . Diabetes Mother   . Diabetes Sister   . Diabetes Brother     Social History   Social History  . Marital Status: Married    Spouse Name: N/A  . Number of Children: 3  . Years of Education: 16   Occupational History  . teacher [employer]   Social History Main Topics  . Smoking status: Never Smoker   . Smokeless tobacco: Never Used  . Alcohol Use: No  . Drug Use: No  . Sexual Activity:    Partners: Male   Other Topics Concern  . None   Social History Narrative   [employer]- BA. 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 2 daughters- '82, '93, 1 son- '87. 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 acetaminophen (TYLENOL) 500 MG tablet Take 500 mg by mouth every 6 (six) hours as needed for mild pain or headache.    . Multiple Vitamin (MULTIVITAMIN WITH MINERALS) TABS tablet Take 1 tablet by mouth daily. Women's formula    . hydrochlorothiazide (HYDRODIURIL) 25 MG tablet Take 25 mg by mouth daily.    . meloxicam (MOBIC) 15 MG tablet Take 1 tablet (15 mg total) by mouth daily. For 2 weeks and then as needed (Patient not taking: Reported on 09/01/2014) 30 tablet 1   No facility-administered medications prior to visit.     EXAM:  BP 142/88 mmHg  Temp(Src) 98.3 F (36.8 C) (Oral)  Wt 260 lb 12.8 oz (118.298 kg)  Body mass index is 42.11 kg/(m^2).  GENERAL: vitals reviewed and listed above, alert, oriented, appears well hydrated and in no acute distress HEENT: atraumatic, conjunctiva  clear, no obvious abnormalities on inspection of external nose and ears NECK: no obvious masses on inspection palpation  No bruits heard  LUNGS: clear to auscultation bilaterally, no wheezes, rales or rhonchi,  CV: HRRR, no clubbing cyanosis or  peripheral edema nl cap refill  MS: moves all extremities without noticeable focal  Abnormality neuro non focal  Intact  PSYCH: pleasant and cooperative, no obvious depression or anxiety Lab Results  Component Value Date   WBC 6.3 09/01/2014   HGB 14.6 09/01/2014   HCT 42.7 09/01/2014   PLT 194 09/01/2014   GLUCOSE 94 09/01/2014   CHOL 195 07/10/2013   TRIG 143.0 07/10/2013   HDL 58.40 07/10/2013   LDLCALC 108* 07/10/2013   ALT 22 02/11/2014   AST 25 02/11/2014   NA 140 09/01/2014   K 3.8 09/01/2014   CL 106 09/01/2014   CREATININE 0.61 09/01/2014   BUN 13 09/01/2014   CO2 24 09/01/2014   TSH 0.48 07/10/2013   INR 1.0 ratio 04/03/2009   BP  Readings from Last 3 Encounters:  09/09/14 142/88  09/01/14 136/78  08/29/14 144/88    ASSESSMENT AND PLAN:  Discussed the following assessment and plan:  Arm pain, left  - fu ed  Atypical chest pain  Essential hypertension - not controlled  had se of meds in past and controlled with lsi  impement again   place on arb less likely to cause cough  fu  - Plan: Amb ref to Medical Nutrition Therapy-MNT  Stress due to illness of family member  Obesity - Plan: Amb ref to Medical Nutrition Therapy-MNT  it is possible the is is ms cause   No findings now and sx resolved  Keep appt with cards  / if ETT vs other indicated . Fu with alarm features  .  Plan fu 2 months  Or if needed  Stress disc . Can try 40 valsartan  Inc to 80 if needed . Qd  -Patient advised to return or notify health care team  if symptoms worsen ,persist or new concerns arise.  Patient Instructions  Could be from a pinch nerve or stress but I agree should be evaluated to make sure it is not a vascular cause. Although your blood pressures have been up and down because they are not at goal most times we will add a low dose of medication until you can implement lifestyle interventions as you have done in the past. Someone will contact you about a referral to nutrition dietary. Plan follow-up in 2 months regarding to your blood pressure or as convenient for you.  Contact us if not at goal below 140/90 most readings. Keep appointment with cardiology. Contact us if current symptoms or concerns.    Stress Stress-related medical problems are becoming increasingly common. The body has a built-in physical response to stressful situations. Faced with pressure, challenge or danger, we need to react quickly. Our bodies release hormones such as cortisol and adrenaline to help do this. These hormones are part of the "fight or flight" response and affect the metabolic rate, heart rate and blood pressure, resulting in a heightened,  stressed state that prepares the body for optimum performance in dealing with a stressful situation. It is likely that early man required these mechanisms to stay alive, but usually modern stresses do not call for this, and the same hormones released in today's world can damage health and reduce coping ability. CAUSES  Pressure to perform at work, at school or in sports.  Threats of physical violence.  Money worries.  Arguments.  Family conflicts.  Divorce or separation from significant other.  Bereavement.  New job or unemployment.  Changes in location.  Alcohol or drug abuse. SOMETIMES, THERE IS NO PARTICULAR REASON FOR DEVELOPING STRESS. Almost all people are at risk of being stressed at some time in their lives. It is important to know that some stress is temporary and some is long term.  Temporary stress will go away when a situation is resolved. Most people can cope with short periods of stress, and it can often be relieved by relaxing, taking a walk or getting any type of exercise, chatting through issues with friends, or having a good night's sleep.  Chronic (long-term, continuous) stress is much harder to deal with. It can be psychologically and emotionally damaging. It can be harmful both for an individual and for friends and family. SYMPTOMS Everyone reacts to stress differently. There are some common effects that help [place] recognize it. In times of extreme stress, people may:  Shake uncontrollably.  Breathe faster and deeper than normal (hyperventilate).  Vomit.  For people with asthma, stress can trigger an attack.  For some people, stress may trigger migraine headaches, ulcers, and body pain. PHYSICAL EFFECTS OF STRESS MAY INCLUDE:  Loss of energy.  Skin problems.  Aches and pains resulting from tense muscles, including neck ache, backache and tension headaches.  Increased pain from arthritis and other conditions.  Irregular heart beat  (palpitations).  Periods of irritability or anger.  Apathy or depression.  Anxiety (feeling uptight or worrying).  Unusual behavior.  Loss of appetite.  Comfort eating.  Lack of concentration.  Loss of, or decreased, sex-drive.  Increased smoking, drinking, or recreational drug use.  For women, missed periods.  Ulcers, joint pain, and muscle pain. Post-traumatic stress is the stress caused by any serious accident, strong emotional damage, or extremely difficult or violent experience such as rape or war. Post-traumatic stress victims can experience mixtures of emotions such as fear, shame, depression, guilt or anger. It may include recurrent memories or images that may be haunting. These feelings can last for weeks, months or even years after the traumatic event that triggered them. Specialized treatment, possibly with medicines and psychological therapies, is available. If stress is causing physical symptoms, severe distress or making it difficult for you to function as normal, it is worth seeing your caregiver. It is important to remember that although stress is a usual part of life, extreme or prolonged stress can lead to other illnesses that will need treatment. It is better to visit a doctor sooner rather than later. Stress has been linked to the development of high blood pressure and heart disease, as well as insomnia and depression. There is no diagnostic test for stress since everyone reacts to it differently. But a caregiver will be able to spot the physical symptoms, such as:  Headaches.  Shingles.  Ulcers. Emotional distress such as intense worry, low mood or irritability should be detected when the doctor asks pertinent questions to identify any underlying problems that might be the cause. In case there are physical reasons for the symptoms, the doctor may also want to do some tests to exclude certain conditions. If you feel that you are suffering from stress, try to  identify the aspects of your life that are causing it. Sometimes you may not be able to change or avoid them, but even a small change can have a positive ripple effect. A simple lifestyle change can make all the difference. STRATEGIES THAT CAN HELP DEAL WITH STRESS:  Delegating or sharing responsibilities.  Avoiding confrontations.  Learning to be more assertive.  Regular exercise.  Avoid using alcohol or street drugs to cope.  Eating a healthy, balanced diet, rich in fruit and vegetables and proteins.  Finding humor or absurdity in stressful situations.  Never taking on more than you know you can handle comfortably.  Organizing your time better to get as much done as possible.  Talking to friends or family and sharing your thoughts and fears.  Listening to music or relaxation tapes.  Relaxation techniques like deep breathing, meditation, and yoga.  Tensing and then relaxing your muscles, starting at the toes and working up to the head and neck. If you think that you would benefit from help, either in identifying the things that are causing your stress or in learning techniques to help you relax, see a caregiver who is capable of helping you with this. Rather than relying on medications, it is usually better to try and identify the things in your life that  are causing stress and try to deal with them. There are many techniques of managing stress including counseling, psychotherapy, aromatherapy, yoga, and exercise. Your caregiver can help you determine what is best for you. Document Released: 03/26/2002 Document Revised: 01/08/2013 Document Reviewed: 02/20/2007 [facility] Patient Information 2015 [place], [place]. This information is not intended to replace advice given to you by your health care provider. Make sure you discuss any questions you have with your health care provider.      Standley Brooking. Panosh M.D.

## 2014-09-09 NOTE — Patient Instructions (Addendum)
Could be from a pinch nerve or stress but I agree should be evaluated to make sure it is not a vascular cause. Although your blood pressures have been up and down because they are not at goal most times we will add a low dose of medication until you can implement lifestyle interventions as you have done in the past. Someone will contact you about a referral to nutrition dietary. Plan follow-up in 2 months regarding to your blood pressure or as convenient for you.  Contact us if not at goal below 140/90 most readings. Keep appointment with cardiology. Contact us if current symptoms or concerns.    Stress Stress-related medical problems are becoming increasingly common. The body has a built-in physical response to stressful situations. Faced with pressure, challenge or danger, we need to react quickly. Our bodies release hormones such as cortisol and adrenaline to help do this. These hormones are part of the "fight or flight" response and affect the metabolic rate, heart rate and blood pressure, resulting in a heightened, stressed state that prepares the body for optimum performance in dealing with a stressful situation. It is likely that early man required these mechanisms to stay alive, but usually modern stresses do not call for this, and the same hormones released in today's world can damage health and reduce coping ability. CAUSES  Pressure to perform at work, at school or in sports.  Threats of physical violence.  Money worries.  Arguments.  Family conflicts.  Divorce or separation from significant other.  Bereavement.  New job or unemployment.  Changes in location.  Alcohol or drug abuse. SOMETIMES, THERE IS NO PARTICULAR REASON FOR DEVELOPING STRESS. Almost all people are at risk of being stressed at some time in their lives. It is important to know that some stress is temporary and some is long term.  Temporary stress will go away when a situation is resolved. Most  people can cope with short periods of stress, and it can often be relieved by relaxing, taking a walk or getting any type of exercise, chatting through issues with friends, or having a good night's sleep.  Chronic (long-term, continuous) stress is much harder to deal with. It can be psychologically and emotionally damaging. It can be harmful both for an individual and for friends and family. SYMPTOMS Everyone reacts to stress differently. There are some common effects that help [place] recognize it. In times of extreme stress, people may:  Shake uncontrollably.  Breathe faster and deeper than normal (hyperventilate).  Vomit.  For people with asthma, stress can trigger an attack.  For some people, stress may trigger migraine headaches, ulcers, and body pain. PHYSICAL EFFECTS OF STRESS MAY INCLUDE:  Loss of energy.  Skin problems.  Aches and pains resulting from tense muscles, including neck ache, backache and tension headaches.  Increased pain from arthritis and other conditions.  Irregular heart beat (palpitations).  Periods of irritability or anger.  Apathy or depression.  Anxiety (feeling uptight or worrying).  Unusual behavior.  Loss of appetite.  Comfort eating.  Lack of concentration.  Loss of, or decreased, sex-drive.  Increased smoking, drinking, or recreational drug use.  For women, missed periods.  Ulcers, joint pain, and muscle pain. Post-traumatic stress is the stress caused by any serious accident, strong emotional damage, or extremely difficult or violent experience such as rape or war. Post-traumatic stress victims can experience mixtures of emotions such as fear, shame, depression, guilt or anger. It may include recurrent memories or images that may  be haunting. These feelings can last for weeks, months or even years after the traumatic event that triggered them. Specialized treatment, possibly with medicines and psychological therapies, is available. If  stress is causing physical symptoms, severe distress or making it difficult for you to function as normal, it is worth seeing your caregiver. It is important to remember that although stress is a usual part of life, extreme or prolonged stress can lead to other illnesses that will need treatment. It is better to visit a doctor sooner rather than later. Stress has been linked to the development of high blood pressure and heart disease, as well as insomnia and depression. There is no diagnostic test for stress since everyone reacts to it differently. But a caregiver will be able to spot the physical symptoms, such as:  Headaches.  Shingles.  Ulcers. Emotional distress such as intense worry, low mood or irritability should be detected when the doctor asks pertinent questions to identify any underlying problems that might be the cause. In case there are physical reasons for the symptoms, the doctor may also want to do some tests to exclude certain conditions. If you feel that you are suffering from stress, try to identify the aspects of your life that are causing it. Sometimes you may not be able to change or avoid them, but even a small change can have a positive ripple effect. A simple lifestyle change can make all the difference. STRATEGIES THAT CAN HELP DEAL WITH STRESS:  Delegating or sharing responsibilities.  Avoiding confrontations.  Learning to be more assertive.  Regular exercise.  Avoid using alcohol or street drugs to cope.  Eating a healthy, balanced diet, rich in fruit and vegetables and proteins.  Finding humor or absurdity in stressful situations.  Never taking on more than you know you can handle comfortably.  Organizing your time better to get as much done as possible.  Talking to friends or family and sharing your thoughts and fears.  Listening to music or relaxation tapes.  Relaxation techniques like deep breathing, meditation, and yoga.  Tensing and then relaxing  your muscles, starting at the toes and working up to the head and neck. If you think that you would benefit from help, either in identifying the things that are causing your stress or in learning techniques to help you relax, see a caregiver who is capable of helping you with this. Rather than relying on medications, it is usually better to try and identify the things in your life that are causing stress and try to deal with them. There are many techniques of managing stress including counseling, psychotherapy, aromatherapy, yoga, and exercise. Your caregiver can help you determine what is best for you. Document Released: 03/26/2002 Document Revised: 01/08/2013 Document Reviewed: 02/20/2007 [facility] Patient Information 2015 [place], [place]. This information is not intended to replace advice given to you by your health care provider. Make sure you discuss any questions you have with your health care provider.

## 2014-10-09 ENCOUNTER — Ambulatory Visit: Payer: Federal, State, Local not specified - PPO | Admitting: Internal Medicine

## 2014-12-01 ENCOUNTER — Emergency Department (HOSPITAL_COMMUNITY)
Admission: EM | Admit: 2014-12-01 | Discharge: 2014-12-01 | Disposition: A | Discharge status: Home / Self Care | Payer: Federal, State, Local not specified - PPO | Source: Home / Self Care | Attending: Family Medicine | Admitting: Family Medicine

## 2014-12-01 ENCOUNTER — Encounter (HOSPITAL_COMMUNITY): Payer: Self-pay | Admitting: *Deleted

## 2014-12-01 DIAGNOSIS — E161 Other hypoglycemia: Secondary | ICD-10-CM | POA: Diagnosis not present

## 2014-12-01 HISTORY — DX: Disorder of thyroid, unspecified: E07.9

## 2014-12-01 LAB — POCT I-STAT, CHEM 8
BUN: 18 mg/dL (ref 6–20)
Calcium, Ion: 1.17 mmol/L (ref 1.12–1.23)
Chloride: 104 mmol/L (ref 101–111)
Creatinine, Ser: 0.8 mg/dL (ref 0.44–1.00)
Glucose, Bld: 103 mg/dL — ABNORMAL HIGH (ref 65–99)
HCT: 46 % (ref 36.0–46.0)
Hemoglobin: 15.6 g/dL — ABNORMAL HIGH (ref 12.0–15.0)
Potassium: 3.8 mmol/L (ref 3.5–5.1)
Sodium: 142 mmol/L (ref 135–145)
TCO2: 27 mmol/L (ref 0–100)

## 2014-12-01 NOTE — ED Provider Notes (Signed)
CSN: [id number]     Arrival date & time 12/01/14  1357 History   First MD Initiated Contact with Patient 12/01/14 1538     Chief Complaint  Patient presents with  . Nausea   (Consider location/radiation/quality/duration/timing/severity/associated sxs/prior Treatment) Patient is a 59 y.o. female presenting with general illness. The history is provided by the patient.  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 Palpitations   . Varicose veins   . Near syncope   . Chronic tension headaches   . Nephrolithiasis     history of  passed on own.   Marland Kitchen Hypertension   . Allergy   . Hx of varicella   . Kidney stones   . Thyroid disease    Past Surgical History  Procedure Laterality Date  . Abdominal hysterectomy      fibroids non cancer   . Cholecystectomy    . Tonsillectomy    . Cataract surgery Right 03-02-2012  . Cataract extraction      Bilateral   Family History  Problem Relation Age of Onset  . Cancer Mother     CERVICAL uterin cancer   . Liver disease Mother     from chronic heaptitis   . Heart disease Father     avdisease avr  . Stroke Father   . Cancer Sister     Ovarian and endometrial  . Cancer Maternal Aunt     Colon  . Heart disease Maternal Grandfather   . Heart disease Paternal Grandmother   . Heart disease Paternal Grandfather   . Diabetes Mother   . Diabetes Sister   . Diabetes Brother    Social History  Substance Use Topics  . Smoking status: Never Smoker   . Smokeless tobacco: Never Used  . Alcohol Use: No   OB History     Gravida Para Term Preterm AB TAB SAB Ectopic Multiple Living   3 3 3             Review of Systems  Constitutional: Negative.  Negative for fever.  HENT: Negative.   Respiratory: Negative.   Cardiovascular: Negative.  Negative for palpitations.  Gastrointestinal: Positive for nausea, abdominal pain and diarrhea. Negative for vomiting.  Neurological: Negative for loss of consciousness.  All other systems reviewed and are negative.   Allergies  Contrast media; Iohexol; Morphine; Ciprofloxacin; Penicillins; Sulfamethoxazole-trimethoprim; and Sulfonamide derivatives  Home Medications   Prior to Admission medications   Medication Sig Start Date End Date Taking? Authorizing Provider  acetaminophen (TYLENOL) 500 MG tablet Take 500 mg by mouth every 6 (six) hours as needed for mild pain or headache.    Historical Provider, MD  Multiple Vitamin (MULTIVITAMIN WITH MINERALS) TABS tablet Take 1 tablet by mouth daily. Women's formula    Historical Provider, MD  valsartan (DIOVAN) 80 MG tablet Take 1 tablet (80 mg total) by mouth daily. 09/09/14   Burnis Medin, MD   Meds Ordered and Administered this Visit  Medications - No data to display  There were no vitals taken for this visit. No data found.  Physical Exam  Constitutional: She is oriented to person, place, and time. She appears well-developed and well-nourished. No distress.  Eyes: Conjunctivae are normal. Pupils are equal, round, and reactive to light.  Neck: Normal range of motion. Neck supple.  Cardiovascular: Normal rate, regular rhythm and normal heart sounds.   Pulmonary/Chest: Effort normal and breath sounds normal.  Abdominal: There is no tenderness.  Lymphadenopathy:    She has no cervical adenopathy.  Neurological: She is alert and oriented to person, place, and time.  Skin: Skin is warm and dry.  Nursing note and vitals reviewed.   ED Course  Procedures (including critical care time)  Labs Review Labs Reviewed   POCT I-STAT, CHEM 8 - Abnormal; Notable for the following:    Glucose, Bld 103 (*)    Hemoglobin 15.6 (*)    All other components within normal limits    Imaging Review No results found.   Visual Acuity Review  Right Eye Distance:   Left Eye Distance:   Bilateral Distance:    Right Eye Near:   Left Eye Near:    Bilateral Near:         MDM   1. Fasting hypoglycemia    Pt feels fine without sx at time of d/c., poss hypoglycemia as cause., drank gatorade on way to [facility].   Billy Fischer, MD 12/01/14 [phone]

## 2014-12-01 NOTE — Discharge Instructions (Signed)
See your doctor if further problems. °

## 2014-12-01 NOTE — ED Notes (Signed)
Pt  Reports    Symptoms       Of  Dizzy   Nauseated       Stomach   Ache      And  Some  Diarrhea which  Occurred  Today       While  At  [facility]

## 2014-12-02 ENCOUNTER — Telehealth: Payer: Self-pay | Admitting: Internal Medicine

## 2014-12-02 NOTE — Telephone Encounter (Signed)
Pt went to [facility] urgent care and now need a fup. The only thing available is 12/09/14 at 3pm and she can not come at that time. Where can I schedule her.

## 2014-12-02 NOTE — Telephone Encounter (Signed)
How bout tues nov 22  use the [place] slot at 845  Am?

## 2014-12-03 NOTE — Telephone Encounter (Signed)
lmovm with appt time and date.

## 2014-12-09 ENCOUNTER — Encounter: Payer: Federal, State, Local not specified - PPO | Admitting: Internal Medicine

## 2014-12-09 DIAGNOSIS — Z0289 Encounter for other administrative examinations: Secondary | ICD-10-CM

## 2014-12-09 NOTE — Progress Notes (Signed)
Document opened and reviewed for  visit . No showed .   

## 2015-01-15 ENCOUNTER — Telehealth: Payer: Self-pay | Admitting: Internal Medicine

## 2015-01-15 NOTE — Telephone Encounter (Signed)
Please use SDA on Tuesday

## 2015-01-15 NOTE — Telephone Encounter (Signed)
Pt is scheduled on January 3rd at 3:30pm. Thank you.

## 2015-01-15 NOTE — Telephone Encounter (Signed)
Alicia [name] called saying she has extreme lower back pain going into the tail bone area that's getting worse. It effects her the most when she goes from a sitting to standing position. Now it's to the point where it's difficult for her to get out of the car, stand up off the couch, etc. 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 Velora Mediate schedule next week. Please give her a call if we can work her in.   Pt's ph# [phone] Thank you.

## 2015-01-20 ENCOUNTER — Encounter: Payer: Self-pay | Admitting: Internal Medicine

## 2015-01-20 ENCOUNTER — Ambulatory Visit (INDEPENDENT_AMBULATORY_CARE_PROVIDER_SITE_OTHER): Payer: Federal, State, Local not specified - PPO | Admitting: Internal Medicine

## 2015-01-20 VITALS — BP 146/80 | Temp 98.0°F | Wt 260.2 lb

## 2015-01-20 DIAGNOSIS — M545 Low back pain, unspecified: Secondary | ICD-10-CM

## 2015-01-20 DIAGNOSIS — Z634 Disappearance and death of family member: Secondary | ICD-10-CM | POA: Diagnosis not present

## 2015-01-20 DIAGNOSIS — I10 Essential (primary) hypertension: Secondary | ICD-10-CM | POA: Diagnosis not present

## 2015-01-20 MED ORDER — AMLODIPINE BESYLATE 5 MG PO TABS: 5.0000 mg | ORAL_TABLET | Freq: Every day | ORAL | Status: DC

## 2015-01-20 NOTE — Patient Instructions (Addendum)
Plan to get referral to  sports medicine  .   About he low back pain  This dose seem to be  mechanical at this time.   Your bp is up some today  Goal is below 140/90-  Per day.   Can try different meds   That donet cause cough.   BP Readings from Last 3 Encounters:  01/20/15 146/80  12/01/14 141/89  09/09/14 142/88   If bp not controlled begin  Amlodipine medicatoin to try   ROV   In 2 months or as needed .

## 2015-01-20 NOTE — Progress Notes (Signed)
Pre visit review using our clinic review tool, if applicable. No additional management support is needed unless otherwise documented below in the visit note.  Chief Complaint  Patient presents with  . Lower Back Pain    Tailbone area    HPI: Patient Alicia [name]  comes in today for SDA for  Worsening  problem evaluation..   Father passed dec 1   .Marland Kitchen...  89  Metastatic bone cancer prostate  Ongoing  Sx for a while no recent injury  Heating pad helps at some.  Los midline back pain nearWorse with stiing  And  Tail bone airea standing is ok . Walking is ok  [place] at on  point last April over vacuum cleaner  And  Face and hyperextended back ? B;leeding hemorrhpoids  .  utd on colon  fam hx colon cancer   Has seen Amedeo Plenty   diarrhe from stress.  .    bp stopped vlaasartan felt alos caused cough  Natural  interventions havenet  Checked recently  ROS: See pertinent positives and negatives per HPI. Missed stress test  Etc   Wants to rescedule  Past Medical History  Diagnosis Date  . Palpitations   . Varicose veins   . Near syncope   . Chronic tension headaches   . Nephrolithiasis     history of  passed on own.   Marland Kitchen Hypertension   . Allergy   . Hx of varicella   . Kidney stones   . Thyroid disease     Family History  Problem Relation Age of Onset  . Cancer Mother     CERVICAL uterin cancer   . Liver disease Mother     from chronic heaptitis   . Heart disease Father     avdisease avr  . Stroke Father   . Cancer Sister     Ovarian and endometrial  . Cancer Maternal Aunt     Colon  . Heart disease Maternal Grandfather   . Heart disease Paternal Grandmother   . Heart disease Paternal Grandfather   . Diabetes Mother   . Diabetes Sister   . Diabetes Brother     Social History   Social History  . Marital Status: Married    Spouse Name: N/A  . Number of Children: 3  . Years of Education: 16   Occupational History  . teacher [employer]   Social  History Main Topics  . Smoking status: Never Smoker   . Smokeless tobacco: Never Used  . Alcohol Use: No  . Drug Use: No  . Sexual Activity:    Partners: Male   Other Topics Concern  . None   Social History Narrative   [employer]- BA. 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 2 daughters- '82, '93, 1 son- '87. 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 acetaminophen (TYLENOL) 500 MG tablet Take 500 mg by mouth every 6 (six) hours as needed for mild pain or headache.    . Multiple Vitamin (MULTIVITAMIN WITH MINERALS) TABS tablet Take 1 tablet by mouth daily. Women's formula    . valsartan (  DIOVAN) 80 MG tablet Take 1 tablet (80 mg total) by mouth daily. (Patient not taking: Reported on 01/20/2015) 30 tablet 2   No facility-administered medications prior to visit.     EXAM:  BP 146/80 mmHg  Temp(Src) 98 F (36.7 C) (Oral)  Wt 260 lb 3.2 oz (118.026 kg)  Body mass index is 42.02 kg/(m^2).  GENERAL: vitals reviewed and listed above, alert, oriented, appears well hydrated and in no acute distressefers to stand  Gait easy  HEENT: atraumatic, conjunctiva  clear, no obvious abnormalities on inspection of external nose and ears NECK: no obvious masses on inspection palpation  LUNGS: clear to auscultation bilaterally, no wheezes, rales or rhonchi,  CV: HRRR, no clubbing cyanosis or  peripheral edema nl cap refill back st  Tender low sacral coc area no mass dimple etc   MS: moves all extremities without noticeable focal  abnormality PSYCH: pleasant and cooperative, no obvious depression or anxiety  ASSESSMENT AND PLAN:  Discussed the following assessment and plan:  Midline low back pain without  sciatica - sac cocc area no masses seems mechaniical at this time on going over months  no other alarm features refer SM - Plan: Ambulatory referral to Sports Medicine  Essential hypertension - se of ace arb cough if lsi not controlleing add amlodipine.  given fu in  months or as needed   Death of parent  -Patient advised to return or notify health care team  if symptoms worsen ,persist or new concerns arise.  Patient Instructions   Plan to get referral to  sports medicine  .   About he low back pain  This dose seem to be  mechanical at this time.   Your bp is up some today  Goal is below 140/90-  Per day.   Can try different meds   That donet cause cough.   BP Readings from Last 3 Encounters:  01/20/15 146/80  12/01/14 141/89  09/09/14 142/88   If bp not controlled begin  Amlodipine medicatoin to try   ROV   In 2 months or as needed .    Standley Brooking. Deshante Cassell M.D.

## 2015-01-29 ENCOUNTER — Ambulatory Visit (INDEPENDENT_AMBULATORY_CARE_PROVIDER_SITE_OTHER): Payer: Federal, State, Local not specified - PPO | Admitting: Family Medicine

## 2015-01-29 ENCOUNTER — Encounter: Payer: Self-pay | Admitting: Family Medicine

## 2015-01-29 VITALS — BP 138/86 | HR 61 | Ht 66.0 in | Wt 263.0 lb

## 2015-01-29 DIAGNOSIS — M533 Sacrococcygeal disorders, not elsewhere classified: Secondary | ICD-10-CM

## 2015-01-29 MED ORDER — GABAPENTIN 100 MG PO CAPS: 200.0000 mg | ORAL_CAPSULE | Freq: Every day | ORAL | Status: DC

## 2015-01-29 MED ORDER — DICLOFENAC SODIUM 2 % TD SOLN
TRANSDERMAL | Status: DC
Start: 2015-01-29 — End: 2015-02-26

## 2015-01-29 NOTE — Progress Notes (Signed)
Pre visit review using our clinic review tool, if applicable. No additional management support is needed unless otherwise documented below in the visit note. 

## 2015-01-29 NOTE — Progress Notes (Signed)
[name] [address] [place], [place] [address] Phone: [phone] Subjective:    I'm seeing this patient by the request  of:  Alicia Dawson, MD   CC: Chronic low back pain  [id number] Alicia [name] is a 60 y.o. female coming in with complaint of chronic low back pain. This seemed to happen after patient recently had a death in her family. Her father died secondary to metastatic bone cancer from the prostate. Patient has seen primary care provider for an multiple times and has tried multiple modalities including heating pad in different medications with minimal benefit. Patient does have a past medical history significant for bleeding hemorrhoids but states that this hasassociation. Patient does remember though that it seemed to occur after she fell over a vacuum cleaner 9 months ago. Pelvic she hyperextended her back. Patient states now there is some radiation down her legs. Denies any weakness. Denies any bowel or bladder incontinence. Rates the severity of pain a 7 on a 10.     Past Medical History  Diagnosis Date  . Palpitations   . Varicose veins   . Near syncope   . Chronic tension headaches   . Nephrolithiasis     history of  passed on own.   Marland Kitchen Hypertension   . Allergy   . Hx of varicella   . Kidney stones   . Thyroid disease    Past Surgical History  Procedure Laterality Date  . Abdominal hysterectomy      fibroids non cancer   . Cholecystectomy    . Tonsillectomy    . Cataract surgery Right 03-02-2012  . Cataract extraction      Bilateral   Social History   Social History  . Marital Status: Married    Spouse Name: N/A  . Number of Children: 3  . Years of Education: 16   Occupational History  . teacher [employer]   Social History Main Topics  . Smoking status: Never Smoker   . Smokeless tobacco: Never Used  . Alcohol Use: No  . Drug Use: No  . Sexual Activity:    Partners: Male   Other  Topics Concern  . None   Social History Narrative   [employer]- BA. 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 2 daughters- '82, '93, 1 son- '87. 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 Contrast Media [Iodinated Diagnostic Agents] Anaphylaxis  . Iohexol Anaphylaxis    Pt.states she stopped breathing and was admitted to the ICU~10 years ago  . Valsartan Cough  . Morphine Other (See Comments)    Hallucination  . Ciprofloxacin Itching and Rash  . Penicillins Rash  . Sulfamethoxazole-Trimethoprim Rash  . Sulfonamide Derivatives Rash   Family History  Problem Relation Age of Onset  . Cancer Mother     CERVICAL uterin cancer   . Liver disease Mother     from chronic heaptitis   . Heart disease Father     avdisease avr  . Stroke Father   . Cancer Sister     Ovarian and endometrial  . Cancer Maternal Aunt     Colon  .  Heart disease Maternal Grandfather   . Heart disease Paternal Grandmother   . Heart disease Paternal Grandfather   . Diabetes Mother   . Diabetes Sister   . Diabetes Brother     Past medical history, social, surgical and family history all reviewed in electronic medical record.  No pertanent information unless stated regarding to the chief complaint.   Review of Systems: No headache, visual changes, nausea, vomiting, diarrhea, constipation, dizziness, abdominal pain, skin rash, fevers, chills, night sweats, weight loss, swollen lymph nodes, body aches, joint swelling, muscle aches, chest pain, shortness of breath, mood changes.   Objective Blood pressure 138/86, pulse 61, height 5\' 6"  (1.676 m), weight 263 lb (119.296 kg), SpO2 96 %.  General: No apparent distress alert and oriented x3 mood and affect normal, dressed  appropriately.  HEENT: Pupils equal, extraocular movements intact  Respiratory: Patient's speak in full sentences and does not appear short of breath  Cardiovascular: No lower extremity edema, non tender, no erythema  Skin: Warm dry intact with no signs of infection or rash on extremities or on axial skeleton.  Abdomen: Soft nontender  Neuro: Cranial nerves II through XII are intact, neurovascularly intact in all extremities with 2+ DTRs and 2+ pulses.  Lymph: No lymphadenopathy of posterior or anterior cervical chain or axillae bilaterally.  Gait normal with good balance and coordination.  MSK:  Non tender with full range of motion and good stability and symmetric strength and tone of shoulders, elbows, wrist, hip, knee and ankles bilaterally.  Back Exam:  Inspection: Unremarkable  Motion: Flexion 45 deg, Extension 25 deg, Side Bending to 45 deg bilaterally,  Rotation to 45 deg bilaterally  SLR laying: Negative  XSLR laying: Negative  Palpable tenderness: Severely tender to palpation over the coccyx area FABER: negative. Sensory change: Gross sensation intact to all lumbar and sacral dermatomes.  Reflexes: 2+ at both patellar tendons, 2+ at achilles tendons, Babinski's downgoing.  Strength at foot  Plantar-flexion: 5/5 Dorsi-flexion: 5/5 Eversion: 5/5 Inversion: 5/5  Leg strength  Quad: 5/5 Hamstring: 5/5 Hip flexor: 5/5 Hip abductors: 4/5 but symmetric  Gait unremarkable.     Impression and Recommendations:     This case required medical decision making of moderate complexity.      Note: This dictation was prepared with Dragon dictation along with smaller phrase technology. Any transcriptional errors that result from this process are unintentional.

## 2015-01-29 NOTE — Patient Instructions (Signed)
Good to see you Ice 20 minutes 2 times daily. Usually after activity and before bed. pennsaid pinkie amount topically 2 times daily as needed try it regularly for about a week.  Gabapentin 100mg  at night then 200mg  nightly thereafter Consider a donut to sit on  Avoid hard surfaces when sitting Consider putting a towel or jacket under legs above knees to change the angle  See me again in 3-4 weeks and we will see how you are doing.

## 2015-01-29 NOTE — Assessment & Plan Note (Signed)
I do believe the patient is having more of this secondary to a fall. I do think that she can get better. This is not limiting her regular daily activities at this moment. No radicular symptoms seem to be going down the legs so I do not think that this is a sciatica or any lumbar pathology. Patient is only having the pain with sitting. We discussed icing, avoiding direct contact, patient given prescription for topical anti-inflammatory as well as gabapentin for the neurologic component. Patient has been a try these different changes as well as some over-the-counter medications. Patient will come back and see me again in 3-4 weeks. If continuing have pain we will either consider prednisone versus possible ultrasound with questionable injection. Do not feel that any advance imaging is warranted at this time.

## 2015-02-02 ENCOUNTER — Ambulatory Visit: Payer: Federal, State, Local not specified - PPO | Admitting: Dietician

## 2015-02-26 ENCOUNTER — Encounter: Payer: Self-pay | Admitting: Internal Medicine

## 2015-02-26 ENCOUNTER — Ambulatory Visit (INDEPENDENT_AMBULATORY_CARE_PROVIDER_SITE_OTHER): Payer: Federal, State, Local not specified - PPO | Admitting: Internal Medicine

## 2015-02-26 VITALS — BP 152/90 | HR 90 | Ht 66.0 in | Wt 260.4 lb

## 2015-02-26 DIAGNOSIS — R0789 Other chest pain: Secondary | ICD-10-CM

## 2015-02-26 NOTE — Progress Notes (Signed)
Cardiology Office Note   Date:  02/26/2015   ID:  Alicia [name], DOB 1955/09/22, MRN [id number]  PCP:  Lottie Dawson, MD  Cardiologist:   Dorris Carnes, MD   Pt referred by Dr Regis Bill for CP     History of Present Illness: Alicia [name] is a 60 y.o. female with a history of  Chest pain  Hasnt had anything recently    Mammogram  Negative  When had spells had discmfort in braeast, shoulder, neck  Not associate with motion or activity    Breathing fine now  Trying to diet   Has had occasional dizziness  Last spell a couple days ago  No syncope.    Current Outpatient Prescriptions  Medication Sig Dispense Refill  . acetaminophen (TYLENOL) 500 MG tablet Take 500 mg by mouth every 6 (six) hours as needed for mild pain or headache.    . Multiple Vitamin (MULTIVITAMIN WITH MINERALS) TABS tablet Take 1 tablet by mouth daily. Women's formula     No current facility-administered medications for this visit.    Allergies:   Contrast media; Iohexol; Valsartan; Morphine; Ciprofloxacin; Penicillins; Sulfamethoxazole-trimethoprim; and Sulfonamide derivatives   Past Medical History  Diagnosis Date  . Palpitations   . Varicose veins   . Near syncope   . Chronic tension headaches   . Nephrolithiasis     history of  passed on own.   Marland Kitchen Hypertension   . Allergy   . Hx of varicella   . Kidney stones   . Thyroid disease     Past Surgical History  Procedure Laterality Date  . Abdominal hysterectomy      fibroids non cancer   . Cholecystectomy    . Tonsillectomy    . Cataract surgery Right 03-02-2012  . Cataract extraction      Bilateral     Social History:  The patient  reports that she has never smoked. She has never used smokeless tobacco. She reports that she does not drink alcohol or use illicit drugs.   Family History:  The patient's family history includes Cancer in her maternal aunt, mother, and sister; Diabetes in her brother, mother, and sister; Heart  disease in her father, maternal grandfather, paternal grandfather, and paternal grandmother; Liver disease in her mother; Stroke in her father.    ROS:  Please see the history of present illness. All other systems are reviewed and  Negative to the above problem except as noted.    PHYSICAL EXAM: VS:  BP 152/90 mmHg  Pulse 90  Ht 5\' 6"  (1.676 m)  Wt 260 lb 6.4 oz (118.117 kg)  BMI 42.05 kg/m2  SpO2 96%  GEN:  Morbidly obese 60 yo  in no acute distress HEENT: normal Neck: no JVD, carotid bruits, or masses Cardiac: RRR; no murmurs, rubs, or gallops,no edema  Respiratory:  clear to auscultation bilaterally, normal work of breathing GI: soft, nontender, nondistended, + BS  No hepatomegaly  MS: no deformity Moving all extremities   Skin: warm and dry, no rash Neuro:  Strength and sensation are intact Psych: euthymic mood, full affect   EKG:  EKG is not  ordered today.  On 8/16  Sr    Lipid Panel    Component Value Date/Time   CHOL 195 07/10/2013 1631   TRIG 143.0 07/10/2013 1631   HDL 58.40 07/10/2013 1631   CHOLHDL 3 07/10/2013 1631   VLDL 28.6 07/10/2013 1631   LDLCALC 108* 07/10/2013 1631  Wt Readings from Last 3 Encounters:  02/26/15 260 lb 6.4 oz (118.117 kg)  01/29/15 263 lb (119.296 kg)  01/20/15 260 lb 3.2 oz (118.026 kg)      ASSESSMENT AND PLAN:  1  CP  Atypical for cardiac  Has not had in months  Follow  No testing   2.  HTN  BP is high  This will need to be followed  Needs to lose wt  Pt will f/u with Dr Regis Bill.  3  Dizziness  Rare spell  Encouraged her to stay hydrated.     Current medicines are reviewed at length with the patient today.  The patient does not have concerns regarding medicines.  F/U with me as needed if symptoms change   Signed, Dorris Carnes, MD  02/26/2015 3:46 PM    [address], [place], [place]  [address] Phone: [phone]; Fax: [phone]

## 2015-02-26 NOTE — Patient Instructions (Signed)
Your physician recommends that you continue on your current medications as directed. Please refer to the Current Medication list given to you today. Your physician recommends that you schedule a follow-up appointment in the future as needed with Dr. Harrington Challenger.

## 2015-03-03 ENCOUNTER — Encounter: Payer: Federal, State, Local not specified - PPO | Attending: Internal Medicine | Admitting: Dietician

## 2015-03-03 ENCOUNTER — Encounter: Payer: Self-pay | Admitting: Dietician

## 2015-03-03 VITALS — Ht 66.0 in | Wt 260.0 lb

## 2015-03-03 DIAGNOSIS — I10 Essential (primary) hypertension: Secondary | ICD-10-CM | POA: Insufficient documentation

## 2015-03-03 DIAGNOSIS — E669 Obesity, unspecified: Secondary | ICD-10-CM | POA: Insufficient documentation

## 2015-03-03 NOTE — Progress Notes (Signed)
Medical Nutrition Therapy:  Appt start time: [id number] end time:  [id number].   Assessment:  Primary concerns today: Patient is here alone.  Her concern is HTN that has been diagnosed about a year ago and MD would like to begin medication.  She would like to control this more naturally.  Eating in the morning causes nausea and diarrhea so she avoid eating much at those times.  She has an appointment with GI regarding this on Monday.  3-4 BM/day and diarrhea at times.  Not a stressful eater.  Blood pressure is higher during the school year.  Weight Hx: "Never been thin but put on most of the weight after 3 pregnancies."  UBW 250-275 lbs for the past 15 years. Lowest:  225 lbs in 20's Highest:  275 lbs 5-6 years ago Today:  260 lbs stuck at this weight for the past 4-5 years ago.   Goal:  210 lbs.  She has dieted some in the past but has a problem losing weight.  She has done Weight Watchers in the past.  She is currently on Arbonne diet.  Patient lives with her husband.  Both shop and cook.  Husband is now retired.  She works as a Patent examiner.    Preferred Learning Style:   No preference indicated   Learning Readiness:   Ready  Change in progress   MEDICATIONS: see list   DIETARY INTAKE: Prefers to eat at home.  Problems include eating late and sweet craving.  She does not eat red meat. No bread with dinner.  Avoids diet drinks as they give her a headache.  No fried or greasy in the morning.   Does not salt when cooking or at the table but increased sodium with snack intake.  Does not tolerate milk due to bloating and diarrhea.  Probable lactose intolerance and uses lactose free milk. 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  Usual physical activity: Walks 2 dogs 20-30 minutes twice per day.  Plans to start going to the [facility].    Estimated energy needs: 1400 calories 158 g carbohydrates 105 g protein 39 g fat  Progress Towards Goal(s):  In progress.   Nutritional Diagnosis:  NB-1.1 Food and nutrition-related knowledge deficit As related to weight loss and HTN.  As evidenced by patient report.    Intervention:  Nutrition counseling/education related to HTN and weight management.  Small amounts of protein with each meal and snack. Continue to be active.   See snack list for healthier snacks with less sodium. Monitor portion sizes for snacks in particular. Be a mindful eater.   Could your Multivitamin or coffee be causing nausea?  Teaching Method Utilized:  Visual Auditory Hands on  Handouts given during visit include:  HTN/DASH diet  My plate  Snack list  Barriers to learning/adherence to lifestyle change: none  Demonstrated degree of understanding via:  Teach Back   Monitoring/Evaluation:  Dietary intake, exercise, and body weight prn.

## 2015-03-03 NOTE — Patient Instructions (Signed)
Small amounts of protein with each meal and snack. Continue to be active.   See snack list for healthier snacks with less sodium. Monitor portion sizes for snacks in particular. Be a mindful eater.   Could your Multivitamin or coffee be causing nausea?

## 2015-03-13 ENCOUNTER — Ambulatory Visit: Payer: Federal, State, Local not specified - PPO | Admitting: Internal Medicine

## 2015-03-17 ENCOUNTER — Other Ambulatory Visit: Payer: Self-pay | Admitting: Gastroenterology

## 2015-04-08 ENCOUNTER — Ambulatory Visit (INDEPENDENT_AMBULATORY_CARE_PROVIDER_SITE_OTHER): Payer: Federal, State, Local not specified - PPO | Admitting: Adult Health

## 2015-04-08 ENCOUNTER — Encounter: Payer: Self-pay | Admitting: Adult Health

## 2015-04-08 VITALS — BP 150/90 | Temp 99.8°F | Wt 253.0 lb

## 2015-04-08 DIAGNOSIS — R6889 Other general symptoms and signs: Secondary | ICD-10-CM

## 2015-04-08 DIAGNOSIS — J069 Acute upper respiratory infection, unspecified: Secondary | ICD-10-CM | POA: Diagnosis not present

## 2015-04-08 MED ORDER — OSELTAMIVIR PHOSPHATE 75 MG PO CAPS: 75.0000 mg | ORAL_CAPSULE | Freq: Two times a day (BID) | ORAL | Status: DC

## 2015-04-08 MED ORDER — IPRATROPIUM-ALBUTEROL 0.5-2.5 (3) MG/3ML IN SOLN
3.0000 mL | Freq: Once | RESPIRATORY_TRACT | Status: AC
  Administered 2015-04-08: 3 mL via RESPIRATORY_TRACT

## 2015-04-08 MED ORDER — DOXYCYCLINE HYCLATE 100 MG PO CAPS: 100.0000 mg | ORAL_CAPSULE | Freq: Two times a day (BID) | ORAL | Status: DC

## 2015-04-08 MED ORDER — ONDANSETRON HCL 4 MG PO TABS: 4.0000 mg | ORAL_TABLET | Freq: Three times a day (TID) | ORAL | Status: DC | PRN

## 2015-04-08 MED ORDER — PREDNISONE 10 MG PO TABS: ORAL_TABLET | ORAL | Status: DC

## 2015-04-08 NOTE — Patient Instructions (Addendum)
I am sorry you are feeling so bad.   Your flu swab was positive.   I have sent in a prescription for Tamiflu, take as directed.   It appears as though you also have an upper respiratory infection.  I have sent in a prescription for Prednisone and Doxycycline. Take as directed  Rest and stay well hydrated.   If you have any shortness of breath, please go to the ER.         Influenza, Adult Influenza ("the flu") is a viral infection of the respiratory tract. It occurs more often in winter months because people spend more time in close contact with one another. Influenza can make you feel very sick. Influenza easily spreads from person to person (contagious). CAUSES  Influenza is caused by a virus that infects the respiratory tract. You can catch the virus by breathing in droplets from an infected person's cough or sneeze. You can also catch the virus by touching something that was recently contaminated with the virus and then touching your mouth, nose, or eyes. RISKS AND COMPLICATIONS You may be at risk for a more severe case of influenza if you smoke cigarettes, have diabetes, have chronic heart disease (such as heart failure) or lung disease (such as asthma), or if you have a weakened immune system. Elderly people and pregnant women are also at risk for more serious infections. The most common problem of influenza is a lung infection (pneumonia). Sometimes, this problem can require emergency medical care and may be life threatening. SIGNS AND SYMPTOMS  Symptoms typically last 4 to 10 days and may include:  Fever.  Chills.  Headache, body aches, and muscle aches.  Sore throat.  Chest discomfort and cough.  Poor appetite.  Weakness or feeling tired.  Dizziness.  Nausea or vomiting. DIAGNOSIS  Diagnosis of influenza is often made based on your history and a physical exam. A nose or throat swab test can be done to confirm the diagnosis. TREATMENT  In mild cases, influenza  goes away on its own. Treatment is directed at relieving symptoms. For more severe cases, your health care provider may prescribe antiviral medicines to shorten the sickness. Antibiotic medicines are not effective because the infection is caused by a virus, not by bacteria. HOME CARE INSTRUCTIONS  Take medicines only as directed by your health care provider.  Use a cool mist humidifier to make breathing easier.  Get plenty of rest until your temperature returns to normal. This usually takes 3 to 4 days.  Drink enough fluid to keep your urine clear or pale yellow.  Cover yourmouth and nosewhen coughing or sneezing,and wash your handswellto prevent thevirusfrom spreading.  Stay homefromwork orschool untilthe fever is gonefor at least 62full day. PREVENTION  An annual influenza vaccination (flu shot) is the best way to avoid getting influenza. An annual flu shot is now routinely recommended for all adults in the [place] IF:  You experiencechest pain, yourcough worsens,or you producemore mucus.  Youhave nausea,vomiting, ordiarrhea.  Your fever returns or gets worse. SEEK IMMEDIATE MEDICAL CARE IF:  You havetrouble breathing, you become short of breath,or your skin ornails becomebluish.  You have severe painor stiffnessin the neck.  You develop a sudden headache, or pain in the face or ear.  You have nausea or vomiting that you cannot control. MAKE SURE YOU:   Understand these instructions.  Will watch your condition.  Will get help right away if you are not doing well or get worse.  This information is not intended to replace advice given to you by your health care provider. Make sure you discuss any questions you have with your health care provider.   Document Released: 01/01/2000 Document Revised: 01/24/2014 Document Reviewed: 04/04/2011 Elsevier Interactive Patient Education [employer].

## 2015-04-08 NOTE — Progress Notes (Signed)
Subjective:    Patient ID: Alicia [name], female    DOB: 1955/07/28, 60 y.o.   MRN: [id number]  Influenza This is a new problem. The current episode started in the past 7 days. The problem occurs constantly. Associated symptoms include anorexia, chills, congestion, coughing, fatigue, a fever (101), myalgias, nausea, neck pain, a sore throat and weakness. Associated symptoms comments: Shortness of breath. The symptoms are aggravated by coughing and swallowing. She has tried acetaminophen for the symptoms. The treatment provided mild relief.  URI  This is a new problem. The current episode started yesterday. The problem has been gradually worsening. The maximum temperature recorded prior to her arrival was 101 - 101.9 F. The fever has been present for 1 to 2 days. Associated symptoms include congestion, coughing, ear pain, nausea, neck pain, rhinorrhea, a sore throat and wheezing. Associated symptoms comments: Shortness of breath. She has tried acetaminophen for the symptoms. The treatment provided mild relief.     Review of Systems  Constitutional: Positive for fever (101), chills, activity change, appetite change and fatigue.  HENT: Positive for congestion, ear pain, postnasal drip, rhinorrhea, sinus pressure and sore throat. Negative for ear discharge.   Eyes: Negative.   Respiratory: Positive for cough, chest tightness, shortness of breath and wheezing. Negative for stridor.   Cardiovascular: Negative.   Gastrointestinal: Positive for nausea and anorexia.  Musculoskeletal: Positive for myalgias and neck pain.  Neurological: Positive for weakness.   Past Medical History  Diagnosis Date  . Palpitations   . Varicose veins   . Near syncope   . Chronic tension headaches   . Nephrolithiasis     history of  passed on own.   Marland Kitchen Hypertension   . Allergy   . Hx of varicella   . Kidney stones   . Thyroid disease     Social History   Social History  . Marital Status: Married   Spouse Name: N/A  . Number of Children: 3  . Years of Education: 16   Occupational History  . teacher [employer]   Social History Main Topics  . Smoking status: Never Smoker   . Smokeless tobacco: Never Used  . Alcohol Use: No  . Drug Use: No  . Sexual Activity:    Partners: Male   Other Topics Concern  . Not on file   Social History Narrative   [employer]- [employer]. 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 2 daughters- '82, '93, 1 son- '87. 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 Abdominal hysterectomy      fibroids non cancer   . Cholecystectomy    . Tonsillectomy    . Cataract surgery Right 03-02-2012  . Cataract extraction      Bilateral    Family History  Problem Relation Age of Onset  . Cancer Mother     CERVICAL uterin cancer   . Liver disease Mother     from chronic heaptitis   . Heart disease Father     avdisease avr  . Stroke Father   . Cancer Sister     Ovarian and endometrial  . Cancer  Maternal Aunt     Colon  . Heart disease Maternal Grandfather   . Heart disease Paternal Grandmother   . Heart disease Paternal Grandfather   . Diabetes Mother   . Diabetes Sister   . Diabetes Brother     Allergies  Allergen Reactions  . Contrast Media [Iodinated Diagnostic Agents] Anaphylaxis  . Iohexol Anaphylaxis    Pt.states she stopped breathing and was admitted to the ICU~10 years ago  . Valsartan Cough  . Morphine Other (See Comments)    Hallucination  . Shellfish Allergy Diarrhea and Nausea Only  . Ciprofloxacin Itching and Rash  . Penicillins Rash  . Sulfamethoxazole-Trimethoprim Rash  . Sulfonamide Derivatives Rash    Current Outpatient Prescriptions on File Prior to  Visit  Medication Sig Dispense Refill  . acetaminophen (TYLENOL) 500 MG tablet Take 500 mg by mouth every 6 (six) hours as needed for mild pain or headache.    . cholecalciferol (VITAMIN D) 1000 units tablet Take 1,000 Units by mouth daily.    . Multiple Vitamin (MULTIVITAMIN WITH MINERALS) TABS tablet Take 1 tablet by mouth daily. Women's formula    . pyridOXINE (VITAMIN B-6) 50 MG tablet Take 50 mg by mouth daily.    . vitamin B-12 (CYANOCOBALAMIN) 100 MCG tablet Take 100 mcg by mouth daily.     No current facility-administered medications on file prior to visit.    BP 150/90 mmHg  Temp(Src) 99.8 F (37.7 C) (Oral)  Wt 253 lb (114.76 kg)       Objective:   Physical Exam  Constitutional: She is oriented to person, place, and time. She appears well-developed and well-nourished. She appears distressed.  Tired and worn out  HENT:  Head: Normocephalic and atraumatic.  Right Ear: Hearing, tympanic membrane, external ear and ear canal normal.  Left Ear: Hearing, tympanic membrane, external ear and ear canal normal.  Nose: Mucosal edema and rhinorrhea present. Right sinus exhibits maxillary sinus tenderness and frontal sinus tenderness. Left sinus exhibits maxillary sinus tenderness and frontal sinus tenderness.  Mouth/Throat: Oropharynx is clear and moist. No oropharyngeal exudate.  Eyes: Conjunctivae and EOM are normal. Pupils are equal, round, and reactive to light. Right eye exhibits no discharge. Left eye exhibits no discharge. No scleral icterus.  Neck: Normal range of motion. Neck supple. No tracheal deviation present. No thyromegaly present.  Cardiovascular: Normal rate, regular rhythm, normal heart sounds and intact distal pulses.  Exam reveals no gallop and no friction rub.   No murmur heard. Pulmonary/Chest: Accessory muscle usage present. No respiratory distress. She has wheezes in the right upper field and the left upper field. She has no rhonchi. She has no rales.  Audible  wheezing heard  Musculoskeletal: Normal range of motion. She exhibits no edema or tenderness.  Lymphadenopathy:    She has no cervical adenopathy.  Neurological: She is alert and oriented to person, place, and time.  Skin: Skin is warm and dry. No rash noted. She is not diaphoretic. No erythema. No pallor.  Psychiatric: She has a normal mood and affect. Her behavior is normal. Judgment and thought content normal.  Nursing note and vitals reviewed.      Assessment & Plan:  1. Flu-like symptoms - POCT Influenza A- Positive - oseltamivir (TAMIFLU) 75 MG capsule; Take 1 capsule (75 mg total) by mouth 2 (two) times daily.  Dispense: 10 capsule; Refill: 0 - ondansetron (ZOFRAN) 4 MG tablet; Take 1 tablet (4 mg total) by mouth every 8 (eight) hours as needed  for nausea or vomiting.  Dispense: 20 tablet; Refill: 0 -Rest and stay well-hydrated -Follow up if no improvement 2. Acute upper respiratory infection - doxycycline (VIBRAMYCIN) 100 MG capsule; Take 1 capsule (100 mg total) by mouth 2 (two) times daily.  Dispense: 20 capsule; Refill: 0 - predniSONE (DELTASONE) 10 MG tablet; 40mg  x 3 days, 20 mgx 3 days, 10 mg x 3 days  Dispense: 21 tablet; Refill: 0 - ipratropium-albuterol (DUONEB) 0.5-2.5 (3) MG/3ML nebulizer solution 3 mL; Take 3 mLs by nebulization once. - After nebulizer was given, the patient endorsed being able to breathe much easier. Her oxygen saturations went up to 97% room air -She is to go to the ER if she has any shortness of breath or feeling that she could not breathe

## 2015-04-10 ENCOUNTER — Ambulatory Visit (INDEPENDENT_AMBULATORY_CARE_PROVIDER_SITE_OTHER): Payer: Federal, State, Local not specified - PPO | Admitting: Internal Medicine

## 2015-04-10 ENCOUNTER — Encounter: Payer: Self-pay | Admitting: Internal Medicine

## 2015-04-10 ENCOUNTER — Telehealth: Payer: Self-pay | Admitting: Internal Medicine

## 2015-04-10 VITALS — BP 120/82 | HR 60 | Temp 98.7°F | Wt 251.3 lb

## 2015-04-10 DIAGNOSIS — R05 Cough: Secondary | ICD-10-CM | POA: Diagnosis not present

## 2015-04-10 DIAGNOSIS — T50905A Adverse effect of unspecified drugs, medicaments and biological substances, initial encounter: Secondary | ICD-10-CM

## 2015-04-10 DIAGNOSIS — T887XXA Unspecified adverse effect of drug or medicament, initial encounter: Secondary | ICD-10-CM | POA: Diagnosis not present

## 2015-04-10 DIAGNOSIS — J111 Influenza due to unidentified influenza virus with other respiratory manifestations: Secondary | ICD-10-CM | POA: Diagnosis not present

## 2015-04-10 DIAGNOSIS — R059 Cough, unspecified: Secondary | ICD-10-CM

## 2015-04-10 MED ORDER — HYDROCODONE-HOMATROPINE 5-1.5 MG/5ML PO SYRP: ORAL_SOLUTION | ORAL | Status: DC

## 2015-04-10 MED ORDER — IPRATROPIUM-ALBUTEROL 0.5-2.5 (3) MG/3ML IN SOLN
3.0000 mL | Freq: Once | RESPIRATORY_TRACT | Status: AC
  Administered 2015-04-10: 3 mL via RESPIRATORY_TRACT

## 2015-04-10 NOTE — Progress Notes (Signed)
Pre visit review using our clinic review tool, if applicable. No additional management support is needed unless otherwise documented below in the visit note.   Chief Complaint  Patient presents with  . Follow-up    Wants to be treated for cough and to have her lungs checked.  Believes she had a reaction to the prednsone.  Experienced a burning sensation in her face.    HPI: Alicia [name] 60 y.o.  Onset with cough a week  ago  And then got fever  And had pos flu screen  Temp 99   ...  No flu immuniz this year . Cough not getting better and wakening s  .    Would like something to help . thoat ear ache runny nose . At onset better   Prednisone  For a 2  Day s  Got sever red  Flushed  And hot .  So stopped and helped the sx   No rash or hives  Taking  Antibiotic  For "URI"   Doxy bid tolerating   Fever gone after a few days   Now  99  Chest still tight . At times  Hard to sleep chest sore from coughing  Neb helped last visit  ROS: See pertinent positives and negatives per HPI.  Past Medical History  Diagnosis Date  . Palpitations   . Varicose veins   . Near syncope   . Chronic tension headaches   . Nephrolithiasis     history of  passed on own.   Marland Kitchen Hypertension   . Allergy   . Hx of varicella   . Kidney stones   . Thyroid disease     Family History  Problem Relation Age of Onset  . Cancer Mother     CERVICAL uterin cancer   . Liver disease Mother     from chronic heaptitis   . Heart disease Father     avdisease avr  . Stroke Father   . Cancer Sister     Ovarian and endometrial  . Cancer Maternal Aunt     Colon  . Heart disease Maternal Grandfather   . Heart disease Paternal Grandmother   . Heart disease Paternal Grandfather   . Diabetes Mother   . Diabetes Sister   . Diabetes Brother     Social History   Social History  . Marital Status: Married    Spouse Name: N/A  . Number of Children: 3  . Years of Education: 16   Occupational History  . teacher  [employer]   Social History Main Topics  . Smoking status: Never Smoker   . Smokeless tobacco: Never Used  . Alcohol Use: No  . Drug Use: No  . Sexual Activity:    Partners: Male   Other Topics Concern  . None   Social History Narrative   [employer]- BA. 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 2 daughters- '82, '93, 1 son- '87. 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 acetaminophen (TYLENOL) 500 MG tablet Take 500 mg by mouth every 6 (six) hours as needed for mild  pain or headache.    . cholecalciferol (VITAMIN D) 1000 units tablet Take 1,000 Units by mouth daily.    Marland Kitchen doxycycline (VIBRAMYCIN) 100 MG capsule Take 1 capsule (100 mg total) by mouth 2 (two) times daily. 20 capsule 0  . Multiple Vitamin (MULTIVITAMIN WITH MINERALS) TABS tablet Take 1 tablet by mouth daily. Women's formula    . ondansetron (ZOFRAN) 4 MG tablet Take 1 tablet (4 mg total) by mouth every 8 (eight) hours as needed for nausea or vomiting. 20 tablet 0  . pyridOXINE (VITAMIN B-6) 50 MG tablet Take 50 mg by mouth daily.    . vitamin B-12 (CYANOCOBALAMIN) 100 MCG tablet Take 100 mcg by mouth daily.    . predniSONE (DELTASONE) 10 MG tablet 40mg  x 3 days, 20 mgx 3 days, 10 mg x 3 days (Patient not taking: Reported on 04/10/2015) 21 tablet 0  . oseltamivir (TAMIFLU) 75 MG capsule Take 1 capsule (75 mg total) by mouth 2 (two) times daily. 10 capsule 0   No facility-administered medications prior to visit.     EXAM:  BP 120/82 mmHg  Pulse 60  Temp(Src) 98.7 F (37.1 C) (Oral)  Wt 251 lb 4.8 oz (113.989 kg)  SpO2 97%  Body mass index is 40.58 kg/(m^2). WDWN in NAD  quiet respirations; mildly congested   somewhat hoarse. Non toxic . Deep  Bronchial cough  HEENT: Normocephalic ;atraumatic , Eyes;  PERRL, EOMs  Full, lids and conjunctiva clear,,Ears: no deformities, canals nl, TM landmarks normal, Nose: no deformity or discharge but congested;face minimally tender Mouth : OP clear without lesion or edema . Neck: Supple without adenopathy or masses or bruits Chest:  Clear to A&P without wheezes rales or rhonchi ? Tight  Nebulizer given   And felt much better    CV:  S1-S2 no gallops or murmurs peripheral perfusion is normal Skin :nl perfusion and no acute rashes   ASSESSMENT AND PLAN:  Discussed the following assessment and plan:  Influenza with respiratory manifestation - pos test  - Plan: ipratropium-albuterol (DUONEB) 0.5-2.5 (3) MG/3ML nebulizer solution 3 mL  Medication side effect, initial encounter prednisone - flushed face .  Cough - poss secondary bronchospasm   no wheezing on exam and pulse ox ok   - Plan: ipratropium-albuterol (DUONEB) 0.5-2.5 (3) MG/3ML nebulizer solution 3 mL On antibiotic   reviewed alarm sx  And rerutn fever etc  Consider x ray if peri-orbital cellulitis  Asks about seeing allergist about med allergies  Consider consult but not a skint etsing issues usually . Can call  If needs referral  Reaction to pred not an allergy but a reaction -Patient advised to return or notify health care team  if symptoms worsen ,persist or new concerns arise. Total visit 83mins > 50% spent counseling and coordinating care as indicated in above note and in instructions to patient .   Patient Instructions  Can use mucinex dm in day   And hydrocodone at night for cough med  If not improving after another 5 days contact us  Cough can last for 2+ weeks but should be feeling well.   If fever 100.5 and above recurs need reeval consideration of getting chest x ray but you are on a med that would treat some bacterial pneumonias.     Standley Brooking. Missi Mcmackin M.D.

## 2015-04-10 NOTE — Patient Instructions (Signed)
Can use mucinex dm in day   And hydrocodone at night for cough med  If not improving after another 5 days contact us  Cough can last for 2+ weeks but should be feeling well.   If fever 100.5 and above recurs need reeval consideration of getting chest x ray but you are on a med that would treat some bacterial pneumonias.
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
## 2015-07-16 ENCOUNTER — Ambulatory Visit: Payer: Federal, State, Local not specified - PPO | Admitting: Internal Medicine

## 2015-07-17 ENCOUNTER — Ambulatory Visit (INDEPENDENT_AMBULATORY_CARE_PROVIDER_SITE_OTHER): Payer: Federal, State, Local not specified - PPO | Admitting: Internal Medicine

## 2015-07-17 ENCOUNTER — Encounter: Payer: Self-pay | Admitting: Internal Medicine

## 2015-07-17 VITALS — BP 158/90 | Temp 98.2°F | Wt 255.3 lb

## 2015-07-17 DIAGNOSIS — R5383 Other fatigue: Secondary | ICD-10-CM

## 2015-07-17 DIAGNOSIS — Z114 Encounter for screening for human immunodeficiency virus [HIV]: Secondary | ICD-10-CM | POA: Diagnosis not present

## 2015-07-17 DIAGNOSIS — R7301 Impaired fasting glucose: Secondary | ICD-10-CM

## 2015-07-17 DIAGNOSIS — I10 Essential (primary) hypertension: Secondary | ICD-10-CM | POA: Diagnosis not present

## 2015-07-17 DIAGNOSIS — M545 Low back pain, unspecified: Secondary | ICD-10-CM

## 2015-07-17 DIAGNOSIS — Z1159 Encounter for screening for other viral diseases: Secondary | ICD-10-CM

## 2015-07-17 LAB — CBC WITH DIFFERENTIAL/PLATELET
Basophils Absolute: 0 10*3/uL (ref 0.0–0.1)
Basophils Relative: 0.6 % (ref 0.0–3.0)
Eosinophils Absolute: 0.2 10*3/uL (ref 0.0–0.7)
Eosinophils Relative: 3.8 % (ref 0.0–5.0)
HCT: 39.8 % (ref 36.0–46.0)
Hemoglobin: 13.5 g/dL (ref 12.0–15.0)
Lymphocytes Relative: 26.5 % (ref 12.0–46.0)
Lymphs Abs: 1.7 10*3/uL (ref 0.7–4.0)
MCHC: 34 g/dL (ref 30.0–36.0)
MCV: 90.6 fl (ref 78.0–100.0)
Monocytes Absolute: 0.7 10*3/uL (ref 0.1–1.0)
Monocytes Relative: 10.5 % (ref 3.0–12.0)
Neutro Abs: 3.7 10*3/uL (ref 1.4–7.7)
Neutrophils Relative %: 58.6 % (ref 43.0–77.0)
Platelets: 210 10*3/uL (ref 150.0–400.0)
RBC: 4.39 Mil/uL (ref 3.87–5.11)
RDW: 13 % (ref 11.5–15.5)
WBC: 6.2 10*3/uL (ref 4.0–10.5)

## 2015-07-17 LAB — BASIC METABOLIC PANEL
BUN: 15 mg/dL (ref 6–23)
CO2: 30 mEq/L (ref 19–32)
Calcium: 9.3 mg/dL (ref 8.4–10.5)
Chloride: 106 mEq/L (ref 96–112)
Creatinine, Ser: 0.63 mg/dL (ref 0.40–1.20)
GFR: 102.69 mL/min (ref >60.00)
Glucose, Bld: 92 mg/dL (ref 70–99)
Potassium: 4 mEq/L (ref 3.5–5.1)
Sodium: 143 mEq/L (ref 135–145)

## 2015-07-17 LAB — LIPID PANEL
Cholesterol: 190 mg/dL (ref 0–200)
HDL: 64.8 mg/dL (ref >39.00)
LDL Cholesterol: 98 mg/dL (ref 0–99)
NonHDL: 125.42
Total CHOL/HDL Ratio: 3
Triglycerides: 139 mg/dL (ref 0.0–149.0)
VLDL: 27.8 mg/dL (ref 0.0–40.0)

## 2015-07-17 LAB — TSH: TSH: 2.92 u[IU]/mL (ref 0.35–4.50)

## 2015-07-17 LAB — VITAMIN B12: Vitamin B-12: 457 pg/mL (ref 211–911)

## 2015-07-17 LAB — HEMOGLOBIN A1C: Hgb A1c MFr Bld: 5.5 % (ref 4.6–6.5)

## 2015-07-17 LAB — HEPATIC FUNCTION PANEL
ALT: 19 U/L (ref 0–35)
AST: 18 U/L (ref 0–37)
Albumin: 4.2 g/dL (ref 3.5–5.2)
Alkaline Phosphatase: 70 U/L (ref 39–117)
Bilirubin, Direct: 0.2 mg/dL (ref 0.0–0.3)
Total Bilirubin: 0.9 mg/dL (ref 0.2–1.2)
Total Protein: 6.9 g/dL (ref 6.0–8.3)

## 2015-07-17 LAB — T4, FREE: Free T4: 0.83 ng/dL (ref 0.60–1.60)

## 2015-07-17 MED ORDER — AMLODIPINE BESYLATE 2.5 MG PO TABS: 2.5000 mg | ORAL_TABLET | Freq: Every day | ORAL | Status: DC

## 2015-07-17 NOTE — Assessment & Plan Note (Signed)
Had cough with valsartan   Trial  Low dose amlodipine 2.5    Qd   Home bp at 142 range   Could considier other arb in future  nsaid could effect readings

## 2015-07-17 NOTE — Progress Notes (Signed)
Pre visit review using our clinic review tool, if applicable. No additional management support is needed unless otherwise documented below in the visit note.  Chief Complaint  Patient presents with  . Fatigue    HPI: Welcome Sirak 60 y.o.  Comes in for sda for fatigue  But mostly to get checked Due for blood tests  Before school starts     LBP seeing  appt   .   Most problematic  Needs sugar levels checked   Sleep:   7 hours .   ? Back  Arm  parethesia .  moibility problems  Sitting is the worse  breatahing is fine.   ROS: See pertinent positives and negatives per HPI. No cp sob  Hard to control bp  Usually 142  Range at home . Has se of prev meds  Se diuretics has leg cramps  Had cough with valsartan   Past Medical History  Diagnosis Date  . Palpitations   . Varicose veins   . Near syncope   . Chronic tension headaches   . Nephrolithiasis     history of  passed on own.   Marland Kitchen Hypertension   . Allergy   . Hx of varicella   . Kidney stones   . Thyroid disease     Family History  Problem Relation Age of Onset  . Cancer Mother     CERVICAL uterin cancer   . Liver disease Mother     from chronic heaptitis   . Heart disease Father     avdisease avr  . Stroke Father   . Cancer Sister     Ovarian and endometrial  . Cancer Maternal Aunt     Colon  . Heart disease Maternal Grandfather   . Heart disease Paternal Grandmother   . Heart disease Paternal Grandfather   . Diabetes Mother   . Diabetes Sister   . Diabetes Brother     Social History   Social History  . Marital Status: Married    Spouse Name: N/A  . Number of Children: 3  . Years of Education: 16   Occupational History  . teacher [employer]   Social History Main Topics  . Smoking status: Never Smoker   . Smokeless tobacco: Never Used  . Alcohol Use: No  . Drug Use: No  . Sexual Activity:    Partners: Male   Other Topics Concern  . None   Social History Narrative   [employer]- [employer]. 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 2 daughters- '82, '93, 1 son- '87. 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 acetaminophen (TYLENOL) 500 MG tablet Take 500 mg by mouth every 6 (six) hours as needed for mild pain or headache.    . cholecalciferol (VITAMIN D) 1000 units tablet Take 1,000 Units by mouth daily.    . Multiple Vitamin (MULTIVITAMIN WITH MINERALS) TABS tablet Take 1 tablet by mouth daily. Women's formula    . vitamin B-12 (CYANOCOBALAMIN) 100 MCG tablet Take 100 mcg by mouth daily.    Marland Kitchen doxycycline (VIBRAMYCIN) 100 MG capsule Take 1 capsule (  100 mg total) by mouth 2 (two) times daily. 20 capsule 0  . HYDROcodone-homatropine (HYCODAN) 5-1.5 MG/5ML syrup 1 tsp at night or every 4-6 hours if needed for cough 180 mL 0  . ondansetron (ZOFRAN) 4 MG tablet Take 1 tablet (4 mg total) by mouth every 8 (eight) hours as needed for nausea or vomiting. 20 tablet 0  . predniSONE (DELTASONE) 10 MG tablet 40mg  x 3 days, 20 mgx 3 days, 10 mg x 3 days (Patient not taking: Reported on 04/10/2015) 21 tablet 0  . pyridOXINE (VITAMIN B-6) 50 MG tablet Take 50 mg by mouth daily.     No facility-administered medications prior to visit.     EXAM:  BP 158/90 mmHg  Temp(Src) 98.2 F (36.8 C) (Oral)  Wt 255 lb 4.8 oz (115.803 kg)  Body mass index is 41.23 kg/(m^2). Repeat BP 158/90 GENERAL: vitals reviewed and listed above, alert, oriented, appears well hydrated and in no acute distress HEENT: atraumatic, conjunctiva  clear, no obvious abnormalities on inspection of external nose and ears  NECK: no obvious masses on inspection palpation  LUNGS: clear to auscultation bilaterally, no wheezes, rales  or rhonchi, good air movement CV: HRRR, no clubbing cyanosis or  peripheral edema nl cap refill  Abdomen:  Sof,t normal bowel sounds without hepatosplenomegaly, no guarding rebound or masses no CVA tenderness MS: moves all extremities without noticeable focal  abnormality PSYCH: pleasant and cooperative, no obvious depression or anxiety   ASSESSMENT AND PLAN:  Discussed the following assessment and plan:  Essential hypertension - Plan: Basic metabolic panel, CBC with Differential/Platelet, Hemoglobin A1c, Hepatic function panel, Lipid panel, TSH, T4, free, Hepatitis C antibody, HIV antibody, Vitamin B12  Other fatigue - Plan: Basic metabolic panel, CBC with Differential/Platelet, Hemoglobin A1c, Hepatic function panel, Lipid panel, TSH, T4, free, Hepatitis C antibody, HIV antibody, Vitamin B12  Need for hepatitis C screening test - Plan: Hepatitis C antibody  Screening for HIV (human immunodeficiency virus) - Plan: HIV antibody  Midline low back pain without sciatica - Plan: Basic metabolic panel, CBC with Differential/Platelet, Hemoglobin A1c, Hepatic function panel, Lipid panel, TSH, T4, free, Hepatitis C antibody, HIV antibody, Vitamin B12  Fasting hyperglycemia - Plan: Basic metabolic panel, CBC with Differential/Platelet, Hemoglobin A1c, Hepatic function panel, Lipid panel, TSH, T4, free, Hepatitis C antibody, HIV antibody, Vitamin B12 Plan rov in 2 months or send in readings  For bp control issues  -Patient advised to return or notify health care team  if symptoms worsen ,persist or new concerns arise. Total visit 2mins > 50% spent counseling and coordinating care as indicated in above note and in instructions to patient .   Patient Instructions   Checking blood work today. Blood pressure is up. We may need to try different blood pressure medicine at 1 dose. Need follow-up in a month after beginning medicine. Although you got a cough with valsartan there may be other medicines in  the group we could try if needed.    Standley Brooking. Lynsie Mcwatters M.D. Lab Results  Component Value Date   WBC 6.2 07/17/2015   HGB 13.5 07/17/2015   HCT 39.8 07/17/2015   PLT 210.0 07/17/2015   GLUCOSE 92 07/17/2015   CHOL 190 07/17/2015   TRIG 139.0 07/17/2015   HDL 64.80 07/17/2015   LDLCALC 98 07/17/2015   ALT 19 07/17/2015   AST 18 07/17/2015   NA 143 07/17/2015   K 4.0 07/17/2015   CL 106 07/17/2015   CREATININE 0.63 07/17/2015  BUN 15 07/17/2015   CO2 30 07/17/2015   TSH 2.92 07/17/2015   INR 1.0 ratio 04/03/2009   HGBA1C 5.5 07/17/2015  labs  Normal  So far reassuring  Will let her know .

## 2015-07-17 NOTE — Patient Instructions (Signed)
  Checking blood work today. Blood pressure is up. We may need to try different blood pressure medicine at 1 dose. Need follow-up in a month after beginning medicine. Although you got a cough with valsartan there may be other medicines in the group we could try if needed.

## 2015-07-18 LAB — HIV ANTIBODY (ROUTINE TESTING W REFLEX): HIV 1&2 Ab, 4th Generation: NONREACTIVE

## 2015-07-18 LAB — HEPATITIS C ANTIBODY: HCV Ab: NEGATIVE

## 2015-08-03 DIAGNOSIS — M5431 Sciatica, right side: Secondary | ICD-10-CM | POA: Diagnosis not present

## 2015-08-03 DIAGNOSIS — M5127 Other intervertebral disc displacement, lumbosacral region: Secondary | ICD-10-CM | POA: Diagnosis not present

## 2015-08-04 DIAGNOSIS — M5431 Sciatica, right side: Secondary | ICD-10-CM | POA: Diagnosis not present

## 2015-08-10 DIAGNOSIS — M5127 Other intervertebral disc displacement, lumbosacral region: Secondary | ICD-10-CM | POA: Diagnosis not present

## 2015-08-10 DIAGNOSIS — M5431 Sciatica, right side: Secondary | ICD-10-CM | POA: Diagnosis not present

## 2015-08-10 DIAGNOSIS — M542 Cervicalgia: Secondary | ICD-10-CM | POA: Diagnosis not present

## 2015-08-11 DIAGNOSIS — H43812 Vitreous degeneration, left eye: Secondary | ICD-10-CM | POA: Diagnosis not present

## 2015-08-14 DIAGNOSIS — M542 Cervicalgia: Secondary | ICD-10-CM | POA: Diagnosis not present

## 2015-08-18 DIAGNOSIS — M542 Cervicalgia: Secondary | ICD-10-CM | POA: Diagnosis not present

## 2015-08-18 DIAGNOSIS — M5431 Sciatica, right side: Secondary | ICD-10-CM | POA: Diagnosis not present

## 2015-08-21 DIAGNOSIS — M5431 Sciatica, right side: Secondary | ICD-10-CM | POA: Diagnosis not present

## 2015-08-21 DIAGNOSIS — M542 Cervicalgia: Secondary | ICD-10-CM | POA: Diagnosis not present

## 2015-08-24 DIAGNOSIS — M5431 Sciatica, right side: Secondary | ICD-10-CM | POA: Diagnosis not present

## 2015-08-24 DIAGNOSIS — M542 Cervicalgia: Secondary | ICD-10-CM | POA: Diagnosis not present

## 2015-08-26 DIAGNOSIS — M542 Cervicalgia: Secondary | ICD-10-CM | POA: Diagnosis not present

## 2015-08-31 DIAGNOSIS — Z1389 Encounter for screening for other disorder: Secondary | ICD-10-CM | POA: Diagnosis not present

## 2015-08-31 DIAGNOSIS — Z130 Encounter for screening for diseases of the blood and blood-forming organs and certain disorders involving the immune mechanism: Secondary | ICD-10-CM | POA: Diagnosis not present

## 2015-08-31 DIAGNOSIS — Z8041 Family history of malignant neoplasm of ovary: Secondary | ICD-10-CM | POA: Diagnosis not present

## 2015-08-31 DIAGNOSIS — Z01419 Encounter for gynecological examination (general) (routine) without abnormal findings: Secondary | ICD-10-CM | POA: Diagnosis not present

## 2015-08-31 DIAGNOSIS — Z6841 Body Mass Index (BMI) 40.0 and over, adult: Secondary | ICD-10-CM | POA: Diagnosis not present

## 2015-09-04 DIAGNOSIS — M5127 Other intervertebral disc displacement, lumbosacral region: Secondary | ICD-10-CM | POA: Diagnosis not present

## 2015-09-04 DIAGNOSIS — M5431 Sciatica, right side: Secondary | ICD-10-CM | POA: Diagnosis not present

## 2015-09-04 DIAGNOSIS — M542 Cervicalgia: Secondary | ICD-10-CM | POA: Diagnosis not present

## 2015-11-13 DIAGNOSIS — H1132 Conjunctival hemorrhage, left eye: Secondary | ICD-10-CM | POA: Diagnosis not present

## 2016-01-29 NOTE — Progress Notes (Signed)
Pre visit review using our clinic review tool, if applicable. No additional management support is needed unless otherwise documented below in the visit note.  Chief Complaint  Patient presents with  . Follow-up    Also complains of morning headaches.  Had about 5 since New Years.  Pounding coming from the back of her head.    HPI: Alicia [name] 61 y.o.   Fu bp control  Etc last visit 6 17   Not taking medication ! Because wasn't convinced she needed it doesn't like to take medicine. However she has been checking her blood pressure readings and they are in the 158/89 ranges. Since new year she number of episodes of wakening at 3 or 4 in the morning with headaches that are throbbing relieved by Aleve. Her right shoulder has been problematic is most things haven't helped it except for Aleve. She denies any neurologic symptoms acutely. She has had anxiety most recently. Works in school system. He is now willing to take medication. She did just get filled amlodipine 5 mg.  Night headaches about 2 x per week.  Aleve helps .    No osa.   Too much aleve.  ROS: See pertinent positives and negatives per HPI. No chest pain shortness of breath or cough at this time. Denied sleep apnea symptoms. Choking further loud snoring. No sig  etooh caffiene  Past Medical History:  Diagnosis Date  . Allergy   . Chronic tension headaches   . Hx of varicella   . Hypertension   . Kidney stones   . Near syncope   . Nephrolithiasis    history of  passed on own.   . Palpitations   . Thyroid disease   . Varicose veins     Family History  Problem Relation Age of Onset  . Cancer Mother     CERVICAL uterin cancer   . Liver disease Mother     from chronic heaptitis   . Heart disease Father     avdisease avr  . Stroke Father   . Cancer Sister     Ovarian and endometrial  . Cancer Maternal Aunt     Colon  . Heart disease Maternal Grandfather   . Heart disease Paternal Grandmother   . Heart  disease Paternal Grandfather   . Diabetes Mother   . Diabetes Sister   . Diabetes Brother     Social History   Social History  . Marital status: Married    Spouse name: N/A  . Number of children: 3  . Years of education: 52   Occupational History  . teacher [employer]   Social History Main Topics  . Smoking status: Never Smoker  . Smokeless tobacco: Never Used  . Alcohol use No  . Drug use: No  . Sexual activity: Yes    Partners: Male   Other Topics Concern  . None   Social History Narrative   [employer]- BA. 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 2 daughters- '82, '93, 1 son- '87. 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 Multiple Vitamin (MULTIVITAMIN WITH MINERALS)  TABS tablet Take 1 tablet by mouth daily. Women's formula    . vitamin B-12 (CYANOCOBALAMIN) 100 MCG tablet Take 100 mcg by mouth daily.    Marland Kitchen amLODipine (NORVASC) 2.5 MG tablet Take 1 tablet (2.5 mg total) by mouth daily. (Patient not taking: Reported on 02/01/2016) 30 tablet 3  . acetaminophen (TYLENOL) 500 MG tablet Take 500 mg by mouth every 6 (six) hours as needed for mild pain or headache.    . cholecalciferol (VITAMIN D) 1000 units tablet Take 1,000 Units by mouth daily.     No facility-administered medications prior to visit.      EXAM:  BP (!) 184/98 (BP Location: Right Arm, Patient Position: Sitting, Cuff Size: Large)   Temp 97.9 F (36.6 C) (Oral)   Wt 255 lb (115.7 kg)   BMI 41.16 kg/m   Body mass index is 41.16 kg/m. Repeat blood pressure reading right arm sitting is 170/98. Large cuff. GENERAL: vitals reviewed and listed above, alert, oriented, appears well hydrated and in no acute  distress HEENT: atraumatic, conjunctiva  clear, no obvious abnormalities on inspection of external nose and ears  NECK: no obvious masses on inspection  CV: HRRR, no clubbing cyanosis or  peripheral edema nl cap refill  PSYCH: pleasant and cooperative, no obvious depression or anxiety Neurologic is grossly nonfocal. BP Readings from Last 3 Encounters:  02/01/16 (!) 184/98  07/17/15 (!) 158/90  04/10/15 120/82   Wt Readings from Last 3 Encounters:  02/01/16 255 lb (115.7 kg)  07/17/15 255 lb 4.8 oz (115.8 kg)  04/10/15 251 lb 4.8 oz (114 kg)   Lab Results  Component Value Date   WBC 6.2 07/17/2015   HGB 13.5 07/17/2015   HCT 39.8 07/17/2015   PLT 210.0 07/17/2015   GLUCOSE 92 07/17/2015   CHOL 190 07/17/2015   TRIG 139.0 07/17/2015   HDL 64.80 07/17/2015   LDLCALC 98 07/17/2015   ALT 19 07/17/2015   AST 18 07/17/2015   NA 143 07/17/2015   K 4.0 07/17/2015   CL 106 07/17/2015   CREATININE 0.63 07/17/2015   BUN 15 07/17/2015   CO2 30 07/17/2015   TSH 2.92 07/17/2015   INR 1.0 ratio 04/03/2009   HGBA1C 5.5 07/17/2015    ASSESSMENT AND PLAN:  Discussed the following assessment and plan:  Essential hypertension  Nocturnal headaches - episodic  new onset re,ieved by aleve  Out of control hypertension worse might be aggravated by the Aleve. Awakenings at night with headaches could be related to her hypertension or other problems. Otherwise no alarm symptoms or focal symptoms. Begin amlodipine 5 mg a day this may not be enough and we may have to add another medication. Caution with anti-inflammatories follow-up with her orthopedic doctor being about her right shoulder. Send in readings in 3 weeks may have to intensify therapy if needed and track her headaches. Follow-up in 2 months otherwise. -Patient advised to return or notify health care team  if symptoms worsen ,persist or new concerns arise.  Patient Instructions  Please begin  Amlodipine  5 mg   Every day   And then  plan follow up.  Goal is below 140/90 and   Now 130 /80.     Limit the amount of aleve.  As possible   Send in readings on my chart in 3 weeks  And then will make a plan   Otherwise ROV . In 2 months  Track your headaches .     Standley Brooking. Panosh M.D.

## 2016-02-01 ENCOUNTER — Ambulatory Visit (INDEPENDENT_AMBULATORY_CARE_PROVIDER_SITE_OTHER): Payer: Federal, State, Local not specified - PPO | Admitting: Internal Medicine

## 2016-02-01 ENCOUNTER — Encounter: Payer: Self-pay | Admitting: Internal Medicine

## 2016-02-01 VITALS — BP 184/98 | Temp 97.9°F | Wt 255.0 lb

## 2016-02-01 DIAGNOSIS — R519 Headache, unspecified: Secondary | ICD-10-CM

## 2016-02-01 DIAGNOSIS — I10 Essential (primary) hypertension: Secondary | ICD-10-CM | POA: Diagnosis not present

## 2016-02-01 DIAGNOSIS — R51 Headache: Secondary | ICD-10-CM

## 2016-02-01 MED ORDER — AMLODIPINE BESYLATE 5 MG PO TABS: 5.0000 mg | ORAL_TABLET | Freq: Every day | ORAL | 3 refills | Status: DC

## 2016-02-01 NOTE — Patient Instructions (Addendum)
Please begin  Amlodipine  5 mg   Every day   And then plan follow up.  Goal is below 140/90 and   Now 130 /80.     Limit the amount of aleve.  As possible   Send in readings on my chart in 3 weeks  And then will make a plan   Otherwise ROV . In 2 months  Track your headaches .

## 2016-02-05 ENCOUNTER — Telehealth: Payer: Self-pay

## 2016-02-05 NOTE — Telephone Encounter (Signed)
Spoke with pt and she states that headache has improved. She has been taking her BP and it has been running 145/90's. She has started experiencing some dizziness. Advised her to take 1/2 tab in AM and 1/2 tab in PM to try and avoid the dizziness. Also advised her to call office Monday if symptoms do not improve. Pt voiced understanding. Nothing further needed at this time.     Patient Name: Alicia [name] Gender: Female DOB: 04/18/1955 Age: 61 Y 10 M 12 D Return Phone Number: [phone] (Primary), [phone] (Secondary) Address: City/State/Zip: [place] Client [facility] Night - Client Client Site [facility] - Night Physician Shanon Ace - MD Contact Type Call Who Is Calling Patient / Member / Family / Caregiver Call Type Triage / Clinical Relationship To Patient Self Return Phone Number [phone] (Primary) Chief Complaint Blood Pressure High Reason for Call Symptomatic / Request for [place] states she was in the office Monday for high blood pressure, prescribed medication which she started Tues. Had a bad headache yesterday. This morning her BP was 165/98. She wants to know if she should increase her medications, take ASA or something.

## 2016-02-10 DIAGNOSIS — M7501 Adhesive capsulitis of right shoulder: Secondary | ICD-10-CM | POA: Diagnosis not present

## 2016-02-10 DIAGNOSIS — M25511 Pain in right shoulder: Secondary | ICD-10-CM | POA: Diagnosis not present

## 2016-02-10 DIAGNOSIS — M7541 Impingement syndrome of right shoulder: Secondary | ICD-10-CM | POA: Diagnosis not present

## 2016-02-10 DIAGNOSIS — G8929 Other chronic pain: Secondary | ICD-10-CM | POA: Diagnosis not present

## 2016-02-17 DIAGNOSIS — M7501 Adhesive capsulitis of right shoulder: Secondary | ICD-10-CM | POA: Diagnosis not present

## 2016-02-24 DIAGNOSIS — M7541 Impingement syndrome of right shoulder: Secondary | ICD-10-CM | POA: Diagnosis not present

## 2016-02-24 DIAGNOSIS — M7501 Adhesive capsulitis of right shoulder: Secondary | ICD-10-CM | POA: Diagnosis not present

## 2016-02-24 DIAGNOSIS — G8929 Other chronic pain: Secondary | ICD-10-CM | POA: Diagnosis not present

## 2016-02-24 DIAGNOSIS — M25511 Pain in right shoulder: Secondary | ICD-10-CM | POA: Diagnosis not present

## 2016-02-26 ENCOUNTER — Encounter: Payer: Self-pay | Admitting: Internal Medicine

## 2016-03-07 ENCOUNTER — Telehealth: Payer: Self-pay | Admitting: Internal Medicine

## 2016-03-07 NOTE — Telephone Encounter (Signed)
[facility] states they never received the Rx amLODipine (NORVASC) 5 MG tablet  Sent on 02/01/16  Pt called and I did follow up with pharmacy, who states they never received. Can you resend?  Pt prefers a 30 day rx w/ refills  [facility] [address] [address] [place]

## 2016-03-08 NOTE — Telephone Encounter (Signed)
Sent to the pharmacy by e-scribe.  See refill request 

## 2016-03-08 NOTE — Telephone Encounter (Signed)
Sent to the pharmacy by e-scribe. 

## 2016-03-14 DIAGNOSIS — M25511 Pain in right shoulder: Secondary | ICD-10-CM | POA: Diagnosis not present

## 2016-03-14 DIAGNOSIS — S43421A Sprain of right rotator cuff capsule, initial encounter: Secondary | ICD-10-CM | POA: Diagnosis not present

## 2016-03-14 DIAGNOSIS — M7501 Adhesive capsulitis of right shoulder: Secondary | ICD-10-CM | POA: Diagnosis not present

## 2016-04-18 DIAGNOSIS — S43421D Sprain of right rotator cuff capsule, subsequent encounter: Secondary | ICD-10-CM | POA: Diagnosis not present

## 2016-04-18 DIAGNOSIS — Z01818 Encounter for other preprocedural examination: Secondary | ICD-10-CM | POA: Diagnosis not present

## 2016-04-20 DIAGNOSIS — H43812 Vitreous degeneration, left eye: Secondary | ICD-10-CM | POA: Diagnosis not present

## 2016-05-03 NOTE — Progress Notes (Deleted)
No chief complaint on file.   HPI: Alicia [name] 61 y.o. come in for Chronic disease management  Fu BP  ROS: See pertinent positives and negatives per HPI.  Past Medical History:  Diagnosis Date  . Allergy   . Chronic tension headaches   . Hx of varicella   . Hypertension   . Kidney stones   . Near syncope   . Nephrolithiasis    history of  passed on own.   . Palpitations   . Thyroid disease   . Varicose veins     Family History  Problem Relation Age of Onset  . Cancer Mother     CERVICAL uterin cancer   . Liver disease Mother     from chronic heaptitis   . Heart disease Father     avdisease avr  . Stroke Father   . Cancer Sister     Ovarian and endometrial  . Cancer Maternal Aunt     Colon  . Heart disease Maternal Grandfather   . Heart disease Paternal Grandmother   . Heart disease Paternal Grandfather   . Diabetes Mother   . Diabetes Sister   . Diabetes Brother     Social History   Social History  . Marital status: Married    Spouse name: N/A  . Number of children: 3  . Years of education: 23   Occupational History  . teacher [employer]   Social History Main Topics  . Smoking status: Never Smoker  . Smokeless tobacco: Never Used  . Alcohol use No  . Drug use: No  . Sexual activity: Yes    Partners: Male   Other Topics Concern  . Not on file   Social History Narrative   [place]- BA. 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 2 daughters- '82, '93, 1 son- '87. 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 ALPRAZolam (XANAX PO) Take by mouth. USES BEFORE DENTAL VISITS.    Marland Kitchen amLODipine (NORVASC) 5 MG tablet TAKE 1 TABLET BY MOUTH DAILY 30  tablet 1  . Multiple Vitamin (MULTIVITAMIN WITH MINERALS) TABS tablet Take 1 tablet by mouth daily. Women's formula    . naproxen sodium (ANAPROX) 220 MG tablet Take 220 mg by mouth daily. FOR RIGHT SHOULDER PAIN    . vitamin B-12 (CYANOCOBALAMIN) 100 MCG tablet Take 100 mcg by mouth daily.     No facility-administered medications prior to visit.      EXAM:  There were no vitals taken for this visit.  There is no height or weight on file to calculate BMI.  GENERAL: vitals reviewed and listed above, alert, oriented, appears well hydrated and in no acute distress HEENT: atraumatic, conjunctiva  clear, no obvious abnormalities on inspection of external nose and ears OP : no lesion edema or exudate  NECK: no obvious masses on inspection palpation  LUNGS: clear to auscultation bilaterally, no wheezes, rales or rhonchi, good air movement CV: HRRR, no clubbing cyanosis or  peripheral edema nl cap refill  MS: moves all extremities without noticeable focal  abnormality PSYCH: pleasant and cooperative, no obvious  depression or anxiety Lab Results  Component Value Date   WBC 6.2 07/17/2015   HGB 13.5 07/17/2015   HCT 39.8 07/17/2015   PLT 210.0 07/17/2015   GLUCOSE 92 07/17/2015   CHOL 190 07/17/2015   TRIG 139.0 07/17/2015   HDL 64.80 07/17/2015   LDLCALC 98 07/17/2015   ALT 19 07/17/2015   AST 18 07/17/2015   NA 143 07/17/2015   K 4.0 07/17/2015   CL 106 07/17/2015   CREATININE 0.63 07/17/2015   BUN 15 07/17/2015   CO2 30 07/17/2015   TSH 2.92 07/17/2015   INR 1.0 ratio 04/03/2009   HGBA1C 5.5 07/17/2015   BP Readings from Last 3 Encounters:  02/01/16 (!) 184/98  07/17/15 (!) 158/90  04/10/15 120/82    ASSESSMENT AND PLAN:  Discussed the following assessment and plan:  No diagnosis found.  -Patient advised to return or notify health care team  if  new concerns arise.  There are no Patient Instructions on file for this visit.   Standley Brooking. Panosh M.D.

## 2016-05-04 ENCOUNTER — Ambulatory Visit: Payer: Federal, State, Local not specified - PPO | Admitting: Internal Medicine

## 2016-05-04 ENCOUNTER — Encounter: Payer: Self-pay | Admitting: Internal Medicine

## 2016-05-04 ENCOUNTER — Ambulatory Visit (INDEPENDENT_AMBULATORY_CARE_PROVIDER_SITE_OTHER): Payer: Federal, State, Local not specified - PPO | Admitting: Internal Medicine

## 2016-05-04 VITALS — BP 130/80 | HR 78 | Temp 98.3°F | Ht 66.0 in | Wt 253.4 lb

## 2016-05-04 DIAGNOSIS — R51 Headache: Secondary | ICD-10-CM

## 2016-05-04 DIAGNOSIS — R519 Headache, unspecified: Secondary | ICD-10-CM | POA: Insufficient documentation

## 2016-05-04 DIAGNOSIS — I10 Essential (primary) hypertension: Secondary | ICD-10-CM

## 2016-05-04 MED ORDER — AMLODIPINE BESYLATE 5 MG PO TABS: 5.0000 mg | ORAL_TABLET | Freq: Every day | ORAL | 3 refills | Status: DC

## 2016-05-04 NOTE — Progress Notes (Signed)
Chief Complaint  Patient presents with  . Follow-up    HPI: Alicia [name] 61 y.o. come in for Chronic disease management  After last visit she started the amlodipine. She is tolerating it well although occasionally gets lightheaded when she has the something. Her nocturnal headaches have totally resolved and her blood pressure is now in the 120 130/70 range. She is to have shoulder surgery for rotator cuff in July at [facility] ([place]). Cortisone shot has helped somewhat. Feels well otherwise. She is to see cardiology preop.    ROS: See pertinent positives and negatives per HPI. No cp sob   Past Medical History:  Diagnosis Date  . Allergy   . Chronic tension headaches   . Hx of varicella   . Hypertension   . Kidney stones   . Near syncope   . Nephrolithiasis    history of  passed on own.   . Palpitations   . Thyroid disease   . Varicose veins     Family History  Problem Relation Age of Onset  . Cancer Mother     CERVICAL uterin cancer   . Liver disease Mother     from chronic heaptitis   . Heart disease Father     avdisease avr  . Stroke Father   . Cancer Sister     Ovarian and endometrial  . Cancer Maternal Aunt     Colon  . Heart disease Maternal Grandfather   . Heart disease Paternal Grandmother   . Heart disease Paternal Grandfather   . Diabetes Mother   . Diabetes Sister   . Diabetes Brother     Social History   Social History  . Marital status: Married    Spouse name: N/A  . Number of children: 3  . Years of education: 16   Occupational History  . teacher [employer]   Social History Main Topics  . Smoking status: Never Smoker  . Smokeless tobacco: Never Used  . Alcohol use No  . Drug use: No  . Sexual activity: Yes    Partners: Male   Other Topics Concern  . None   Social History Narrative   [employer]- BA. 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 2 daughters- '82, '93, 1 son- '87. 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 ALPRAZolam (XANAX PO) Take by mouth. USES BEFORE DENTAL VISITS.    Marland Kitchen Multiple Vitamin (MULTIVITAMIN WITH MINERALS) TABS tablet Take 1 tablet by mouth daily. Women's formula    . vitamin B-12 (CYANOCOBALAMIN) 100 MCG tablet Take 100 mcg by mouth daily.    Marland Kitchen amLODipine (NORVASC) 5 MG tablet TAKE 1 TABLET BY MOUTH DAILY 30 tablet 1  . naproxen sodium (ANAPROX) 220 MG tablet Take 220 mg by mouth daily. FOR RIGHT SHOULDER PAIN     No facility-administered medications prior to visit.      EXAM:  BP 130/80 (BP Location: Right Arm, Patient Position: Sitting, Cuff Size: Normal)   Pulse 78   Temp 98.3 F (36.8 C) (Oral)   Ht 5\' 6"  (1.676 m)   Wt 253 lb 6.4  oz (114.9 kg)   BMI 40.90 kg/m   Body mass index is 40.9 kg/m. Repeat BP right arm large sitting  124/78 GENERAL: vitals reviewed and listed above, alert, oriented, appears well hydrated and in no acute distress HEENT: atraumatic, conjunctiva  clear, no obvious abnormalities on inspection of external nose and ears PSYCH: pleasant and cooperative, no obvious depression or anxiety Lab Results  Component Value Date   WBC 6.2 07/17/2015   HGB 13.5 07/17/2015   HCT 39.8 07/17/2015   PLT 210.0 07/17/2015   GLUCOSE 92 07/17/2015   CHOL 190 07/17/2015   TRIG 139.0 07/17/2015   HDL 64.80 07/17/2015   LDLCALC 98 07/17/2015   ALT 19 07/17/2015   AST 18 07/17/2015   NA 143 07/17/2015   K 4.0 07/17/2015   CL 106 07/17/2015   CREATININE 0.63 07/17/2015   BUN 15 07/17/2015   CO2 30 07/17/2015   TSH 2.92 07/17/2015   INR 1.0 ratio 04/03/2009   HGBA1C 5.5 07/17/2015   BP Readings from Last 3 Encounters:  05/04/16 130/80  02/01/16 (!) 184/98  07/17/15  (!) 158/90    ASSESSMENT AND PLAN:  Discussed the following assessment and plan:  Essential hypertension - controlled   Nocturnal headaches - resolved after bp control  and rx ! Continue lifestyle intervention healthy eating and exercise . And med  Refilled  ROV 6  - 12 mos or as needed  -Patient advised to return or notify health care team  if  new concerns arise.  Patient Instructions  Your blood pressure is so much better. Continue same medication. Also address and continue attention to healthy diet and activity. If your blood pressures rising again or her headaches are returning for no reason that you can intervene with and plan follow-up visit. Otherwise  ROV or PV in about 6 months      Sefora Tietje K. Naphtali Zywicki M.D.

## 2016-05-04 NOTE — Patient Instructions (Addendum)
Your blood pressure is so much better. Continue same medication. Also address and continue attention to healthy diet and activity. If your blood pressures rising again or her headaches are returning for no reason that you can intervene with and plan follow-up visit. Otherwise  ROV or PV in about 6 months

## 2016-05-11 DIAGNOSIS — H02413 Mechanical ptosis of bilateral eyelids: Secondary | ICD-10-CM | POA: Diagnosis not present

## 2016-05-24 ENCOUNTER — Encounter: Payer: Self-pay | Admitting: Physician Assistant

## 2016-05-25 ENCOUNTER — Encounter: Payer: Self-pay | Admitting: Physician Assistant

## 2016-05-25 ENCOUNTER — Ambulatory Visit (INDEPENDENT_AMBULATORY_CARE_PROVIDER_SITE_OTHER): Payer: Federal, State, Local not specified - PPO | Admitting: Physician Assistant

## 2016-05-25 VITALS — BP 136/90 | HR 67 | Ht 66.0 in | Wt 253.1 lb

## 2016-05-25 DIAGNOSIS — Z01818 Encounter for other preprocedural examination: Secondary | ICD-10-CM | POA: Diagnosis not present

## 2016-05-25 DIAGNOSIS — R002 Palpitations: Secondary | ICD-10-CM | POA: Diagnosis not present

## 2016-05-25 DIAGNOSIS — I10 Essential (primary) hypertension: Secondary | ICD-10-CM | POA: Diagnosis not present

## 2016-05-25 NOTE — Progress Notes (Signed)
Cardiology Office Note:    Date:  05/25/2016   ID:  Alicia [name], DOB [birth year], MRN [id number]  PCP:  Burnis Medin, MD  Cardiologist:  Dr. Dorris Carnes    Referring MD: Valentino Saxon, MD   Chief Complaint  Patient presents with  . Follow-up    Surgical clearance    History of Present Illness:    Alicia [name] is a 61 y.o. female with a hx of HTN, palpitations who is being seen today for surgical clearance at the request of Wittstein, Jocelyn, MD.     She was seen by Dr. Rich Fuchs at [facility] in 2014 for the evaluation of palpitations.  She was evaluated by Dr. Harrington Challenger in 2/17 for chest pain. Symptoms were atypical and no further testing was warranted.  She is here alone today. She denies any hx of chest pain or significant dyspnea on exertion.  She denies orthopnea, PND.  She is now on Amlodipine for HTN and has some ankle edema.  She denies syncope. She has had some stomach issues and gets lightheaded at work when she has not been eating well.  She has occ palpitations described as fluttering or skipped beats.    Prior CV studies:   The following studies were reviewed today:  Echo 5/14 - [place] Mild LVH, grade 2 diastolic dysfunction, EF >71, mild LAE  Holter 4/12 NSR, sinus brady, few PVCs, few episodes of SVT  Echo 10/10 Mild LVH, EF 60, normal wall motion, mild LAE   Past Medical History:  Diagnosis Date  . Allergy   . Chronic tension headaches   . Hx of varicella   . Hypertension   . Kidney stones   . Near syncope   . Nephrolithiasis    history of  passed on own.   . Palpitations   . Thyroid disease   . Varicose veins     Past Surgical History:  Procedure Laterality Date  . ABDOMINAL HYSTERECTOMY     fibroids non cancer   . CATARACT EXTRACTION     Bilateral  . cataract surgery Right 03-02-2012  . CHOLECYSTECTOMY    . TONSILLECTOMY      Current Medications: Current Meds  Medication Sig  . ALPRAZolam (XANAX PO) Take by mouth.  USES BEFORE DENTAL VISITS.  Marland Kitchen amLODipine (NORVASC) 5 MG tablet Take 1 tablet (5 mg total) by mouth daily.  . Multiple Vitamin (MULTIVITAMIN WITH MINERALS) TABS tablet Take 1 tablet by mouth daily. Women's formula  . naproxen sodium (ANAPROX) 220 MG tablet Take by mouth.  . vitamin B-12 (CYANOCOBALAMIN) 100 MCG tablet Take 100 mcg by mouth daily.     Allergies:   Contrast media [iodinated diagnostic agents]; Iohexol; Prednisone; Valsartan; Morphine; Shellfish allergy; Ciprofloxacin; Penicillins; Sulfamethoxazole-trimethoprim; and Sulfonamide derivatives   Social History   Social History  . Marital status: Married    Spouse name: N/A  . Number of children: 3  . Years of education: 1   Occupational History  . teacher [employer]   Social History Main Topics  . Smoking status: Never Smoker  . Smokeless tobacco: Never Used  . Alcohol use No  . Drug use: No  . Sexual activity: Yes    Partners: Male   Other Topics Concern  . None   Social History Narrative   [employer]- BA. 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 2 daughters- '82, '93, 1 son- '87. 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  ROS:   Please see the history of present illness.    ROS All other systems reviewed and are negative.   EKGs/Labs/Other Test Reviewed:    EKG:  EKG is  ordered today.  The ekg ordered today demonstrates NSR, HR 67, normal axis, NSSTTW changes, QTc 448 ms, no changes.    Recent Labs: 07/17/2015: ALT 19; BUN 15; Creatinine, Ser 0.63; Hemoglobin 13.5; Platelets 210.0; Potassium 4.0; Sodium 143; TSH 2.92   Recent Lipid Panel    Component Value Date/Time   CHOL 190 07/17/2015 1500   TRIG 139.0 07/17/2015 1500   HDL 64.80 07/17/2015 1500   CHOLHDL 3 07/17/2015 1500   VLDL 27.8 07/17/2015 1500   LDLCALC 98 07/17/2015 1500     Physical Exam:    VS:  BP 136/90   Pulse 67   Ht 5\' 6"  (1.676 m)   Wt 253 lb 1.9 oz (114.8 kg)   BMI 40.85 kg/m     Wt Readings from Last 3 Encounters:  05/25/16 253 lb 1.9 oz (114.8 kg)  05/04/16 253 lb 6.4 oz (114.9 kg)  02/01/16 255 lb (115.7 kg)     Physical Exam  Constitutional: She is oriented to person, place, and time. She appears well-developed and well-nourished. No distress.  HENT:  Head: Normocephalic and atraumatic.  Eyes: No scleral icterus.  Neck: Normal range of motion. No JVD present. Carotid bruit is not present.  Cardiovascular: Normal rate, regular rhythm, S1 normal, S2 normal and normal heart sounds.   No murmur heard. Pulmonary/Chest: Breath sounds normal. She has no wheezes. She has no rhonchi. She has no rales.  Abdominal: Soft. There is no tenderness.  Musculoskeletal: She exhibits edema (very trace bilat ankle ).  Neurological: She is alert and oriented to person, place, and time.  Skin: Skin is warm and dry.  Psychiatric: She has a normal mood and affect.    ASSESSMENT:    1. Preoperative clearance   2. Essential hypertension   3. Palpitations    PLAN:    In order of problems listed above:  1. Preoperative clearance -  The patient does not have any unstable cardiac conditions.  Upon evaluation today, she can achieve 4 METs or greater without anginal symptoms.  According to [facility] and AHA guidelines, she requires no further cardiac workup prior to her noncardiac surgery and should be at acceptable risk.  Our service is available as necessary in the perioperative period.   2.  Essential hypertension - BP elevated today. She notes it usually has been optimal since starting the Amlodipine.  Continue to monitor and follow up with PCP as planned.   3. Palpitations - Echo in 2014 with normal EF.  She has a hx of a Holter that demonstrated PVCs.  Her symptoms sound c/w PVCs vs PACs.  Her symptoms are currently well controlled.  No testing is required at this time.    Dispo:  Return as needed with Dr. Harrington Challenger.  Medication Adjustments/Labs and Tests Ordered: Current medicines are reviewed at length with the patient today.  Concerns regarding medicines are outlined above.  Orders/Tests:  Orders Placed This Encounter  Procedures  . EKG 12-Lead   Medication changes: No orders of the defined types were placed in this encounter.  Signed, Richardson Dopp, PA-C  05/25/2016 4:23 PM    [facility] [place], [place], [place]  [address] Phone: [phone]; Fax: [phone]

## 2016-05-25 NOTE — Patient Instructions (Signed)
Medication Instructions:    Your physician recommends that you continue on your current medications as directed. Please refer to the Current Medication list given to you today.  Labwork:  None ordered  Testing/Procedures:  None ordered  Follow-Up:  No follow up is needed at this time with Dr. Harrington Challenger.  He will see you on an as needed basis.  - If you need a refill on your cardiac medications before your next appointment, please call your pharmacy.   Thank you for choosing [facility]!!

## 2016-05-30 ENCOUNTER — Telehealth: Payer: Self-pay

## 2016-05-30 NOTE — Telephone Encounter (Signed)
Spoke with pt and advised. She will try Ranitidine and see if that helps. She will contact office if not improving.

## 2016-05-30 NOTE — Telephone Encounter (Signed)
LMTCB

## 2016-05-30 NOTE — Telephone Encounter (Signed)
Spoke with Alicia [name] and she states that for the past 3 weeks, since starting Amlodipine, she has been experiencing severe stomach upset with nausea. She reports that this starts almost immediately after taking medication. She changed to taking it in the evening and symptoms have not improved. She would like to know what you recommend.  Dr. Regis Bill - Please advise. Thanks!

## 2016-05-30 NOTE — Telephone Encounter (Signed)
Sometimes amlodipine  aggravated reflux   Silent or otherwise  .   Try adding  Ranitidine  150 mg    Hour before taking  Amlodipine    Wait at least 30 minutes before  Laying down to sleep.   This shouldn't  Keep you from surgical procedure

## 2016-06-01 DIAGNOSIS — M75101 Unspecified rotator cuff tear or rupture of right shoulder, not specified as traumatic: Secondary | ICD-10-CM | POA: Diagnosis not present

## 2016-06-01 DIAGNOSIS — M25511 Pain in right shoulder: Secondary | ICD-10-CM | POA: Diagnosis not present

## 2016-06-01 DIAGNOSIS — I10 Essential (primary) hypertension: Secondary | ICD-10-CM | POA: Diagnosis not present

## 2016-06-01 DIAGNOSIS — Z01818 Encounter for other preprocedural examination: Secondary | ICD-10-CM | POA: Diagnosis not present

## 2016-06-10 DIAGNOSIS — M75121 Complete rotator cuff tear or rupture of right shoulder, not specified as traumatic: Secondary | ICD-10-CM | POA: Diagnosis not present

## 2016-06-22 DIAGNOSIS — K625 Hemorrhage of anus and rectum: Secondary | ICD-10-CM | POA: Diagnosis not present

## 2016-06-30 DIAGNOSIS — M19011 Primary osteoarthritis, right shoulder: Secondary | ICD-10-CM | POA: Diagnosis not present

## 2016-06-30 DIAGNOSIS — M7501 Adhesive capsulitis of right shoulder: Secondary | ICD-10-CM | POA: Diagnosis not present

## 2016-06-30 DIAGNOSIS — M75121 Complete rotator cuff tear or rupture of right shoulder, not specified as traumatic: Secondary | ICD-10-CM | POA: Diagnosis not present

## 2016-06-30 DIAGNOSIS — G8918 Other acute postprocedural pain: Secondary | ICD-10-CM | POA: Diagnosis not present

## 2016-06-30 DIAGNOSIS — Z79899 Other long term (current) drug therapy: Secondary | ICD-10-CM | POA: Diagnosis not present

## 2016-06-30 DIAGNOSIS — Z86010 Personal history of colonic polyps: Secondary | ICD-10-CM | POA: Diagnosis not present

## 2016-06-30 DIAGNOSIS — M25511 Pain in right shoulder: Secondary | ICD-10-CM | POA: Diagnosis not present

## 2016-06-30 DIAGNOSIS — I10 Essential (primary) hypertension: Secondary | ICD-10-CM | POA: Diagnosis not present

## 2016-06-30 DIAGNOSIS — M75101 Unspecified rotator cuff tear or rupture of right shoulder, not specified as traumatic: Secondary | ICD-10-CM | POA: Diagnosis not present

## 2016-06-30 DIAGNOSIS — M7551 Bursitis of right shoulder: Secondary | ICD-10-CM | POA: Diagnosis not present

## 2016-07-06 DIAGNOSIS — M25511 Pain in right shoulder: Secondary | ICD-10-CM | POA: Diagnosis not present

## 2016-07-08 ENCOUNTER — Other Ambulatory Visit: Payer: Self-pay

## 2016-07-08 ENCOUNTER — Telehealth: Payer: Self-pay

## 2016-07-08 DIAGNOSIS — M25511 Pain in right shoulder: Secondary | ICD-10-CM | POA: Diagnosis not present

## 2016-07-08 NOTE — Telephone Encounter (Signed)
Spoke with pt and advised. She will increase medication as directed and call office with update. Advised pt to contact office sooner if any further issues. Nothing further needed at this time.

## 2016-07-08 NOTE — Telephone Encounter (Signed)
If taking 2.5 bid  Increase to 2.5 am 5 mg pm  For now and let us know how doing in a week

## 2016-07-08 NOTE — Telephone Encounter (Signed)
Attempted to call patient but phone disconnects after pt answers.  WCB

## 2016-07-08 NOTE — Telephone Encounter (Signed)
Pt called to report that she had recent shoulder surgery and is recovering well. She has noticed an increase in her blood pressure and frequent early morning headaches. She states that her BP readings have been around 167/95 in the early mornings accompanied by headache. She reports that her BP returns to a more normal reading by later in the morning, 140/80. She has been taking Amlodipine daily 1/2 tab in AM and 1/2 tab in PM. She would like to know what you recommend regarding her BP.  Dr. Regis Bill - Please advise. Thanks!

## 2016-07-12 ENCOUNTER — Ambulatory Visit (INDEPENDENT_AMBULATORY_CARE_PROVIDER_SITE_OTHER): Payer: Federal, State, Local not specified - PPO | Admitting: Internal Medicine

## 2016-07-12 ENCOUNTER — Encounter: Payer: Self-pay | Admitting: Internal Medicine

## 2016-07-12 ENCOUNTER — Ambulatory Visit (INDEPENDENT_AMBULATORY_CARE_PROVIDER_SITE_OTHER): Payer: Federal, State, Local not specified - PPO

## 2016-07-12 VITALS — BP 140/80 | HR 63 | Temp 98.0°F

## 2016-07-12 DIAGNOSIS — I10 Essential (primary) hypertension: Secondary | ICD-10-CM

## 2016-07-12 DIAGNOSIS — R0600 Dyspnea, unspecified: Secondary | ICD-10-CM | POA: Diagnosis not present

## 2016-07-12 DIAGNOSIS — R600 Localized edema: Secondary | ICD-10-CM

## 2016-07-12 DIAGNOSIS — R0602 Shortness of breath: Secondary | ICD-10-CM | POA: Diagnosis not present

## 2016-07-12 LAB — CBC WITH DIFFERENTIAL/PLATELET
Basophils Absolute: 0.1 10*3/uL (ref 0.0–0.1)
Basophils Relative: 1.2 % (ref 0.0–3.0)
Eosinophils Absolute: 0.3 10*3/uL (ref 0.0–0.7)
Eosinophils Relative: 4.6 % (ref 0.0–5.0)
HCT: 37.9 % (ref 36.0–46.0)
Hemoglobin: 12.9 g/dL (ref 12.0–15.0)
Lymphocytes Relative: 23.7 % (ref 12.0–46.0)
Lymphs Abs: 1.6 10*3/uL (ref 0.7–4.0)
MCHC: 33.9 g/dL (ref 30.0–36.0)
MCV: 91.1 fl (ref 78.0–100.0)
Monocytes Absolute: 0.8 10*3/uL (ref 0.1–1.0)
Monocytes Relative: 11.9 % (ref 3.0–12.0)
Neutro Abs: 3.8 10*3/uL (ref 1.4–7.7)
Neutrophils Relative %: 58.6 % (ref 43.0–77.0)
Platelets: 269 10*3/uL (ref 150.0–400.0)
RBC: 4.16 Mil/uL (ref 3.87–5.11)
RDW: 12.6 % (ref 11.5–15.5)
WBC: 6.6 10*3/uL (ref 4.0–10.5)

## 2016-07-12 LAB — COMPREHENSIVE METABOLIC PANEL
ALT: 14 U/L (ref 0–35)
AST: 15 U/L (ref 0–37)
Albumin: 4 g/dL (ref 3.5–5.2)
Alkaline Phosphatase: 66 U/L (ref 39–117)
BUN: 12 mg/dL (ref 6–23)
CO2: 30 mEq/L (ref 19–32)
Calcium: 9.2 mg/dL (ref 8.4–10.5)
Chloride: 105 mEq/L (ref 96–112)
Creatinine, Ser: 0.57 mg/dL (ref 0.40–1.20)
GFR: 114.87 mL/min (ref >60.00)
Glucose, Bld: 90 mg/dL (ref 70–99)
Potassium: 3.7 mEq/L (ref 3.5–5.1)
Sodium: 141 mEq/L (ref 135–145)
Total Bilirubin: 0.5 mg/dL (ref 0.2–1.2)
Total Protein: 6.5 g/dL (ref 6.0–8.3)

## 2016-07-12 LAB — BRAIN NATRIURETIC PEPTIDE: Pro B Natriuretic peptide (BNP): 69 pg/mL (ref 0.0–100.0)

## 2016-07-12 MED ORDER — FUROSEMIDE 20 MG PO TABS: 20.0000 mg | ORAL_TABLET | Freq: Every day | ORAL | 0 refills | Status: DC

## 2016-07-12 NOTE — Patient Instructions (Addendum)
Plan  X ray   Chest   And blood tests   Fluid pill for a week and then decide on next step.  Will have  to add potassium pill if continuing .

## 2016-07-12 NOTE — Progress Notes (Signed)
Chief Complaint  Patient presents with  . Shortness of Breath  . Edema    ankles     HPI: Alicia [name] 61 y.o. come in for  New problem  as above  Seen by surgeon yestserday and wanted to make sure had fu visit .  She is here with her daughter Alicia [name] today was driving. shw is 2 weeks out of shoulder surgery .   Has had sob since surgery .    And feet ankles swelling  . Days after  Surgery  Swelling.  Not sleeping flat  Cause of shoulder. So  That may contribute  Local block     90 min surgery .   No leg immobility .  Home that day  No cough pnd shest pain  Syncope bleeding  Was on bp ok up some   Oxycodone first week.q 3 hours    And then not off and  Taking  Tylenol and  And asa  And ocass aleve not recnet   has been on norvasc for about 5 months  No sig edema   . Remote hx of diuretic and leg cramps for ht .     No hx  Of clotting  Leg pain  Doesn't think had any excess fluid at surgery . Sob comes and goes like nard to get deep breath catching some  Some anxiety but no attacks   No pnd specific doe.  Had heart m heard bu anesthesia and not felt to be sig.  ROS: See pertinent positives and negatives per HPI. No gi gu sx .   Past Medical History:  Diagnosis Date  . Allergy   . Chronic tension headaches   . Hx of varicella   . Hypertension   . Kidney stones   . Near syncope   . Nephrolithiasis    history of  passed on own.   . Palpitations   . Thyroid disease   . Varicose veins     Family History  Problem Relation Age of Onset  . Cancer Mother        CERVICAL uterin cancer   . Liver disease Mother        from chronic heaptitis   . Diabetes Mother   . Heart disease Father        avdisease avr  . Stroke Father   . Cancer Sister        Ovarian and endometrial  . Cancer Maternal Aunt        Colon  . Heart disease Maternal Grandfather   . Heart disease Paternal Grandmother   . Heart disease Paternal Grandfather   . Diabetes Sister   . Diabetes Brother      Social History   Social History  . Marital status: Married    Spouse name: N/A  . Number of children: 3  . Years of education: 42   Occupational History  . teacher [employer]   Social History Main Topics  . Smoking status: Never Smoker  . Smokeless tobacco: Never Used  . Alcohol use No  . Drug use: No  . Sexual activity: Yes    Partners: Male   Other Topics Concern  . None   Social History Narrative   [employer]- BA. 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 2 daughters- '82, '93, 1 son- '87. 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 acetaminophen (TYLENOL) 500 MG tablet Take 1 tablet by mouth every 8 (eight) hours as needed.    . ALPRAZolam (XANAX PO) Take by mouth. USES BEFORE DENTAL VISITS.    Marland Kitchen amLODipine (NORVASC) 5 MG tablet Take 1 tablet (5 mg total) by mouth daily. 90 tablet 3  . aspirin 325 MG tablet Take 1 tablet by mouth daily.    . Multiple Vitamin (MULTIVITAMIN WITH MINERALS) TABS tablet Take 1 tablet by mouth daily. Women's formula    . naproxen sodium (ANAPROX) 220 MG tablet Take by mouth.    . vitamin B-12 (CYANOCOBALAMIN) 100 MCG tablet Take 100 mcg by mouth daily.    Marland Kitchen oxyCODONE (OXY IR/ROXICODONE) 5 MG immediate release tablet Take 1-2 tablets by mouth every 6 (six) hours as needed.    . promethazine (PHENERGAN) 12.5 MG tablet Take 1 tablet by mouth every 6 (six) hours as needed.    . ranitidine (ZANTAC) 150 MG tablet Take 1 tablet by mouth 2 (two) times daily.     No facility-administered medications prior to visit.      EXAM:  BP 140/80 (BP Location: Left Arm, Patient Position: Sitting, Cuff Size: Large)   Pulse 63   Temp 98 F (36.7 C) (Oral)    SpO2 98%   There is no height or weight on file to calculate BMI.  GENERAL: vitals reviewed and listed above, alert, oriented, appears well hydrated and in no acute distress ambulaotry with shoulder apparatus  HEENT: atraumatic, conjunctiva  clear, no obvious abnormalities on inspection of external nose and ears NECK: no obvious masses on inspection palpation  LUNGS: clear to auscultation bilaterally, no wheezes, rales or rhonchi,   Has  Shoulder strap on  Uncertain  Bases  CV: HRRR, short intermittent sem lusb no radiation  No obv jvd sitting  no clubbing cyanosis 2-3 + pedal;  edema nl cap refill  MS: moves all extremities without noticeable focal  Abnormality x right ue immobile  PSYCH: pleasant and cooperative, no obvious   BP Readings from Last 3 Encounters:  07/12/16 140/80  05/25/16 136/90  05/04/16 130/80   Wt Readings from Last 3 Encounters:  05/25/16 253 lb 1.9 oz (114.8 kg)  05/04/16 253 lb 6.4 oz (114.9 kg)  02/01/16 255 lb (115.7 kg)    ASSESSMENT AND PLAN:  Discussed the following assessment and plan:  Bilateral leg edema - Plan: CBC with Differential/Platelet, Comprehensive metabolic panel, Brain natriuretic peptide, DG Chest 2 View  Dyspnea, unspecified type - Plan: CBC with Differential/Platelet, Comprehensive metabolic panel, Brain natriuretic peptide, DG Chest 2 View  Essential hypertension - Plan: CBC with Differential/Platelet, Comprehensive metabolic panel, Brain natriuretic peptide Low risk surgery    Check labs  X ray  Add empiric lasix for a week and then fu on line or in person  .   elevated legs as much possible ( cant do this much since surgery but up and around)  Plan depending on results   Plan short term diuretic or as appropraite .  Will have to add potassium supp if  staying gon diuretic  -Patient advised to return or notify health care team  if  new concerns arise.  Patient Instructions  Plan  X ray   Chest   And blood tests   Fluid pill for a  week and then decide on next step.  Will have  to add potassium pill if continuing .   Standley Brooking. Panosh M.D.  Lab Results  Component Value Date   WBC 6.6 07/12/2016   HGB 12.9 07/12/2016   HCT 37.9 07/12/2016   PLT 269.0 07/12/2016   GLUCOSE 90 07/12/2016   CHOL 190 07/17/2015   TRIG 139.0 07/17/2015   HDL 64.80 07/17/2015   LDLCALC 98 07/17/2015   ALT 14 07/12/2016   AST 15 07/12/2016   NA 141 07/12/2016   K 3.7 07/12/2016   CL 105 07/12/2016   CREATININE 0.57 07/12/2016   BUN 12 07/12/2016   CO2 30 07/12/2016   TSH 2.92 07/17/2015   INR 1.0 ratio 04/03/2009   HGBA1C 5.5 07/17/2015

## 2016-07-14 DIAGNOSIS — M25611 Stiffness of right shoulder, not elsewhere classified: Secondary | ICD-10-CM | POA: Diagnosis not present

## 2016-07-14 DIAGNOSIS — M25511 Pain in right shoulder: Secondary | ICD-10-CM | POA: Diagnosis not present

## 2016-07-14 DIAGNOSIS — M6281 Muscle weakness (generalized): Secondary | ICD-10-CM | POA: Diagnosis not present

## 2016-07-14 DIAGNOSIS — M25521 Pain in right elbow: Secondary | ICD-10-CM | POA: Diagnosis not present

## 2016-07-19 DIAGNOSIS — M25511 Pain in right shoulder: Secondary | ICD-10-CM | POA: Diagnosis not present

## 2016-07-19 DIAGNOSIS — M25611 Stiffness of right shoulder, not elsewhere classified: Secondary | ICD-10-CM | POA: Diagnosis not present

## 2016-07-19 DIAGNOSIS — M25521 Pain in right elbow: Secondary | ICD-10-CM | POA: Diagnosis not present

## 2016-07-19 DIAGNOSIS — M6281 Muscle weakness (generalized): Secondary | ICD-10-CM | POA: Diagnosis not present

## 2016-07-21 DIAGNOSIS — M6281 Muscle weakness (generalized): Secondary | ICD-10-CM | POA: Diagnosis not present

## 2016-07-21 DIAGNOSIS — M25511 Pain in right shoulder: Secondary | ICD-10-CM | POA: Diagnosis not present

## 2016-07-21 DIAGNOSIS — M25521 Pain in right elbow: Secondary | ICD-10-CM | POA: Diagnosis not present

## 2016-07-21 DIAGNOSIS — M25611 Stiffness of right shoulder, not elsewhere classified: Secondary | ICD-10-CM | POA: Diagnosis not present

## 2016-07-26 DIAGNOSIS — M25511 Pain in right shoulder: Secondary | ICD-10-CM | POA: Diagnosis not present

## 2016-07-26 DIAGNOSIS — M6281 Muscle weakness (generalized): Secondary | ICD-10-CM | POA: Diagnosis not present

## 2016-07-26 DIAGNOSIS — M25521 Pain in right elbow: Secondary | ICD-10-CM | POA: Diagnosis not present

## 2016-07-26 DIAGNOSIS — M25611 Stiffness of right shoulder, not elsewhere classified: Secondary | ICD-10-CM | POA: Diagnosis not present

## 2016-07-28 DIAGNOSIS — M25611 Stiffness of right shoulder, not elsewhere classified: Secondary | ICD-10-CM | POA: Diagnosis not present

## 2016-07-28 DIAGNOSIS — M25521 Pain in right elbow: Secondary | ICD-10-CM | POA: Diagnosis not present

## 2016-07-28 DIAGNOSIS — M25511 Pain in right shoulder: Secondary | ICD-10-CM | POA: Diagnosis not present

## 2016-07-28 DIAGNOSIS — M6281 Muscle weakness (generalized): Secondary | ICD-10-CM | POA: Diagnosis not present

## 2016-08-04 DIAGNOSIS — M25511 Pain in right shoulder: Secondary | ICD-10-CM | POA: Diagnosis not present

## 2016-08-04 DIAGNOSIS — M6281 Muscle weakness (generalized): Secondary | ICD-10-CM | POA: Diagnosis not present

## 2016-08-04 DIAGNOSIS — M25521 Pain in right elbow: Secondary | ICD-10-CM | POA: Diagnosis not present

## 2016-08-04 DIAGNOSIS — M25611 Stiffness of right shoulder, not elsewhere classified: Secondary | ICD-10-CM | POA: Diagnosis not present

## 2016-08-05 ENCOUNTER — Encounter: Payer: Self-pay | Admitting: Internal Medicine

## 2016-08-09 DIAGNOSIS — M25511 Pain in right shoulder: Secondary | ICD-10-CM | POA: Diagnosis not present

## 2016-08-09 DIAGNOSIS — M6281 Muscle weakness (generalized): Secondary | ICD-10-CM | POA: Diagnosis not present

## 2016-08-09 DIAGNOSIS — M25521 Pain in right elbow: Secondary | ICD-10-CM | POA: Diagnosis not present

## 2016-08-09 DIAGNOSIS — M25611 Stiffness of right shoulder, not elsewhere classified: Secondary | ICD-10-CM | POA: Diagnosis not present

## 2016-08-11 DIAGNOSIS — M25521 Pain in right elbow: Secondary | ICD-10-CM | POA: Diagnosis not present

## 2016-08-11 DIAGNOSIS — M25611 Stiffness of right shoulder, not elsewhere classified: Secondary | ICD-10-CM | POA: Diagnosis not present

## 2016-08-11 DIAGNOSIS — N6459 Other signs and symptoms in breast: Secondary | ICD-10-CM | POA: Diagnosis not present

## 2016-08-11 DIAGNOSIS — M6281 Muscle weakness (generalized): Secondary | ICD-10-CM | POA: Diagnosis not present

## 2016-08-11 DIAGNOSIS — M25511 Pain in right shoulder: Secondary | ICD-10-CM | POA: Diagnosis not present

## 2016-08-15 ENCOUNTER — Other Ambulatory Visit: Payer: Self-pay | Admitting: Gynecology

## 2016-08-15 ENCOUNTER — Encounter: Payer: Self-pay | Admitting: Internal Medicine

## 2016-08-15 DIAGNOSIS — R234 Changes in skin texture: Secondary | ICD-10-CM

## 2016-08-22 ENCOUNTER — Ambulatory Visit
Admission: RE | Admit: 2016-08-22 | Discharge: 2016-08-22 | Disposition: A | Discharge status: Home / Self Care | Payer: Federal, State, Local not specified - PPO | Source: Ambulatory Visit | Attending: Gynecology | Admitting: Gynecology

## 2016-08-22 DIAGNOSIS — R234 Changes in skin texture: Secondary | ICD-10-CM

## 2016-08-22 DIAGNOSIS — N6489 Other specified disorders of breast: Secondary | ICD-10-CM | POA: Diagnosis not present

## 2016-08-22 DIAGNOSIS — R928 Other abnormal and inconclusive findings on diagnostic imaging of breast: Secondary | ICD-10-CM | POA: Diagnosis not present

## 2016-08-30 DIAGNOSIS — M25521 Pain in right elbow: Secondary | ICD-10-CM | POA: Diagnosis not present

## 2016-08-30 DIAGNOSIS — M6281 Muscle weakness (generalized): Secondary | ICD-10-CM | POA: Diagnosis not present

## 2016-08-30 DIAGNOSIS — M25611 Stiffness of right shoulder, not elsewhere classified: Secondary | ICD-10-CM | POA: Diagnosis not present

## 2016-08-30 DIAGNOSIS — M25511 Pain in right shoulder: Secondary | ICD-10-CM | POA: Diagnosis not present

## 2016-09-05 DIAGNOSIS — M25521 Pain in right elbow: Secondary | ICD-10-CM | POA: Diagnosis not present

## 2016-09-05 DIAGNOSIS — M25611 Stiffness of right shoulder, not elsewhere classified: Secondary | ICD-10-CM | POA: Diagnosis not present

## 2016-09-05 DIAGNOSIS — M6281 Muscle weakness (generalized): Secondary | ICD-10-CM | POA: Diagnosis not present

## 2016-09-05 DIAGNOSIS — M25511 Pain in right shoulder: Secondary | ICD-10-CM | POA: Diagnosis not present

## 2016-09-08 DIAGNOSIS — M6281 Muscle weakness (generalized): Secondary | ICD-10-CM | POA: Diagnosis not present

## 2016-09-08 DIAGNOSIS — M25611 Stiffness of right shoulder, not elsewhere classified: Secondary | ICD-10-CM | POA: Diagnosis not present

## 2016-09-08 DIAGNOSIS — M25511 Pain in right shoulder: Secondary | ICD-10-CM | POA: Diagnosis not present

## 2016-09-08 DIAGNOSIS — M25521 Pain in right elbow: Secondary | ICD-10-CM | POA: Diagnosis not present

## 2016-09-12 DIAGNOSIS — M25511 Pain in right shoulder: Secondary | ICD-10-CM | POA: Diagnosis not present

## 2016-09-12 DIAGNOSIS — M25521 Pain in right elbow: Secondary | ICD-10-CM | POA: Diagnosis not present

## 2016-09-12 DIAGNOSIS — M25611 Stiffness of right shoulder, not elsewhere classified: Secondary | ICD-10-CM | POA: Diagnosis not present

## 2016-09-12 DIAGNOSIS — M6281 Muscle weakness (generalized): Secondary | ICD-10-CM | POA: Diagnosis not present

## 2016-10-06 ENCOUNTER — Encounter: Payer: Self-pay | Admitting: Internal Medicine

## 2016-10-06 ENCOUNTER — Ambulatory Visit (INDEPENDENT_AMBULATORY_CARE_PROVIDER_SITE_OTHER): Payer: Federal, State, Local not specified - PPO | Admitting: Internal Medicine

## 2016-10-06 VITALS — BP 142/86 | HR 68 | Ht 66.0 in | Wt 259.4 lb

## 2016-10-06 DIAGNOSIS — R002 Palpitations: Secondary | ICD-10-CM | POA: Diagnosis not present

## 2016-10-06 DIAGNOSIS — R011 Cardiac murmur, unspecified: Secondary | ICD-10-CM

## 2016-10-06 DIAGNOSIS — R0789 Other chest pain: Secondary | ICD-10-CM

## 2016-10-06 DIAGNOSIS — I10 Essential (primary) hypertension: Secondary | ICD-10-CM

## 2016-10-06 MED ORDER — SPIRONOLACTONE 25 MG PO TABS: 12.5000 mg | ORAL_TABLET | Freq: Every day | ORAL | 3 refills | Status: DC

## 2016-10-06 MED ORDER — SPIRONOLACTONE 25 MG PO TABS: 25.0000 mg | ORAL_TABLET | Freq: Every day | ORAL | 3 refills | Status: DC

## 2016-10-06 NOTE — Progress Notes (Signed)
Cardiology Office Note   Date:  10/06/2016   ID:  Alicia [name], DOB [birth year], MRN [id number]  PCP:  Burnis Medin, MD  Cardiologist:   Dorris Carnes, MD   Pt referred by Dr Regis Bill for CP     History of Present Illness: Alicia [name] is a 61 y.o. female with a history of CP(atypical), palpitation I saw her a few years ago She was last seen by Kathleen Argue as part of a preop eval for shoulder surgery  The pt notes occasional fluttering  Usually feels in bed  Self limited.   Seen in ortho clinic  Told had heart murmur Denies CP  Breathing is OK Notes a lot of stress in job  Severall younger people have had stroke, heart attack   She is concerned about her own heart risks     Current Outpatient Prescriptions  Medication Sig Dispense Refill  . ALPRAZolam (XANAX PO) Take by mouth. USES BEFORE DENTAL VISITS.    Marland Kitchen amLODipine (NORVASC) 5 MG tablet Take 1 tablet (5 mg total) by mouth daily. 90 tablet 3  . Multiple Vitamin (MULTIVITAMIN WITH MINERALS) TABS tablet Take 1 tablet by mouth daily. Women's formula    . Pyridoxine HCl (VITAMIN B-6 PO) Take by mouth as directed.     No current facility-administered medications for this visit.     Allergies:   Contrast media [iodinated diagnostic agents]; Iohexol; Prednisone; Valsartan; Morphine; Shellfish allergy; Sulfa antibiotics; Ciprofloxacin; Penicillins; Sulfamethoxazole-trimethoprim; and Sulfonamide derivatives   Past Medical History:  Diagnosis Date  . Allergy   . Chronic tension headaches   . Hx of varicella   . Hypertension   . Kidney stones   . Near syncope   . Nephrolithiasis    history of  passed on own.   . Palpitations   . Thyroid disease   . Varicose veins     Past Surgical History:  Procedure Laterality Date  . ABDOMINAL HYSTERECTOMY     fibroids non cancer   . CATARACT EXTRACTION     Bilateral  . cataract surgery Right 03-02-2012  . CHOLECYSTECTOMY    . TONSILLECTOMY       Social History:  The  patient  reports that she has never smoked. She has never used smokeless tobacco. She reports that she does not drink alcohol or use drugs.   Family History:  The patient's family history includes Cancer in her maternal aunt, mother, and sister; Diabetes in her brother, mother, and sister; Heart disease in her father, maternal grandfather, paternal grandfather, and paternal grandmother; Liver disease in her mother; Stroke in her father.    ROS:  Please see the history of present illness. All other systems are reviewed and  Negative to the above problem except as noted.    PHYSICAL EXAM: VS:  BP (!) 142/86   Pulse 68   Ht 5\' 6"  (1.676 m)   Wt 259 lb 6.4 oz (117.7 kg)   SpO2 97%   BMI 41.87 kg/m   GEN:  Morbidly obese 61 yo  in no acute distress  HEENT: normal  Neck: no JVD, carotid bruits, or masses Cardiac: RRR; Gr I/VI systolic murmur  No  , rubs, or gallops,  Tr edema  Respiratory:  clear to auscultation bilaterally, normal work of breathing GI: soft, nontender, nondistended, + BS  No hepatomegaly  MS: no deformity Moving all extremities   Skin: warm and dry, no rash Neuro:  Strength and sensation are intact Psych: euthymic  mood, full affect   EKG:  EKG is not  ordered today.     Lipid Panel    Component Value Date/Time   CHOL 190 07/17/2015 1500   TRIG 139.0 07/17/2015 1500   HDL 64.80 07/17/2015 1500   CHOLHDL 3 07/17/2015 1500   VLDL 27.8 07/17/2015 1500   LDLCALC 98 07/17/2015 1500      Wt Readings from Last 3 Encounters:  10/06/16 259 lb 6.4 oz (117.7 kg)  05/25/16 253 lb 1.9 oz (114.8 kg)  05/04/16 253 lb 6.4 oz (114.9 kg)      ASSESSMENT AND PLAN:  1  CP  Denies     2.  HTN  BP is not controlled  I would recomm adding aldosterone 12.5 mg   F/U BMET in 10 days   F?U  In clinic in 2 months   3  Murmur  Will get echo  ALso eval LV   4  Dizziness Denies  5  Cardiac risk Reviewed with pt    Could consider calcium score CT  Need to control BP better   Lipids good    Recomm she stay active       Current medicines are reviewed at length with the patient today.  The patient does not have concerns regarding medicines.  F/U with me as needed if symptoms change   Signed, Dorris Carnes, MD  10/06/2016 4:13 PM    [facility] [address], [place], [place]  [address] Phone: [phone]; Fax: [phone]

## 2016-10-06 NOTE — Patient Instructions (Signed)
Your physician has recommended you make the following change in your medication:  1.) aldactone (spironolactone) 12.5 mg once daily  Your physician recommends that you return for lab work in: 10 days (BMET)  Your physician has requested that you have an echocardiogram. Echocardiography is a painless test that uses sound waves to create images of your heart. It provides your doctor with information about the size and shape of your heart and how well your heart's chambers and valves are working. This procedure takes approximately one hour. There are no restrictions for this procedure.  Your physician recommends that you schedule a follow-up appointment in: 2 months with Dr. Harrington Challenger.

## 2016-10-07 ENCOUNTER — Encounter: Payer: Self-pay | Admitting: Internal Medicine

## 2016-10-17 ENCOUNTER — Other Ambulatory Visit: Payer: Federal, State, Local not specified - PPO

## 2016-10-18 ENCOUNTER — Other Ambulatory Visit: Payer: Self-pay

## 2016-10-18 ENCOUNTER — Ambulatory Visit (HOSPITAL_COMMUNITY): Payer: Federal, State, Local not specified - PPO | Attending: Cardiology

## 2016-10-18 ENCOUNTER — Other Ambulatory Visit: Payer: Federal, State, Local not specified - PPO

## 2016-10-18 DIAGNOSIS — I5030 Unspecified diastolic (congestive) heart failure: Secondary | ICD-10-CM | POA: Diagnosis not present

## 2016-10-18 DIAGNOSIS — I10 Essential (primary) hypertension: Secondary | ICD-10-CM

## 2016-10-18 LAB — ECHOCARDIOGRAM COMPLETE
Area-P 1/2: 2.65 cm2
E decel time: 282 msec
E/e' ratio: 4.89
FS: 31 % (ref 28–44)
IVS/LV PW RATIO, ED: 0.77
LA ID, A-P, ES: 46 mm
LA diam end sys: 46 mm
LA diam index: 1.92 cm/m2
LA vol A4C: 53.2 ml
LA vol index: 28 mL/m2
LA vol: 67.3 mL
LV E/e' medial: 4.89
LV E/e'average: 4.89
LV PW d: 13 mm — AB (ref 0.6–1.1)
LV e' LATERAL: 14.1 cm/s
LVOT SV: 84 mL
LVOT VTI: 26.9 cm
LVOT area: 3.14 cm2
LVOT diameter: 20 mm
LVOT peak vel: 106 cm/s
Lateral S' vel: 9.68 cm/s
MV Dec: 282
MV pk A vel: 100 m/s
MV pk E vel: 68.9 m/s
P 1/2 time: 83 ms
Peak grad: 246 mmHg
RV sys press: 27 mmHg
Reg peak vel: 246 cm/s
TAPSE: 26.4 mm
TDI e' lateral: 14.1
TDI e' medial: 9.89
TR max vel: 246 cm/s

## 2016-10-21 ENCOUNTER — Telehealth: Payer: Self-pay | Admitting: Internal Medicine

## 2016-10-21 DIAGNOSIS — Z79899 Other long term (current) drug therapy: Secondary | ICD-10-CM

## 2016-10-21 NOTE — Telephone Encounter (Signed)
°  Follow Up ° °Calling to follow up on echocardiogram results. Please call. °

## 2016-10-21 NOTE — Telephone Encounter (Signed)
Reviewed echo results with patient. She verbalizes understanding.  She didn't have labs drawn earlier this week.  She will come back for them.  She started taking sprionolactone.  Only took 5 days and had stomach pains from it.  So she stopped and started taking furosemide that she has at home.  20 mg (got when she had shoulder surgery from PCP)  Tolerating.  Swelling in her feet and ankles still present.  Helps just a little.  End of the day swelling is worse.  Painful to walk.   Reviewed sodium intake.  Does not add salt.  Will monitor salt intake and try to stay under 2000 mg per day. She asked if the amlodipine could cause the swelling, even though she has been on it for a year.  I am forwarding to PharmD for recommendations and will call her back on Monday.  I would like her to trial off amlodipine but she needs something in its place for BP.  At last month's appointment her BP was 142/86

## 2016-10-24 NOTE — Telephone Encounter (Signed)
Would have pt try chlorthalidone 25mg  daily instead of the Lasix she recently started to help with swelling and BP. F/u BMET 10 days after starting chlorthalidone.

## 2016-10-25 NOTE — Telephone Encounter (Signed)
Left detailed message for patient to call back.

## 2016-10-26 MED ORDER — CHLORTHALIDONE 25 MG PO TABS: 25.0000 mg | ORAL_TABLET | Freq: Every day | ORAL | 11 refills | Status: DC

## 2016-10-26 NOTE — Telephone Encounter (Signed)
F/u Message ° °Pt returning RN call .please call back to discuss  °

## 2016-10-26 NOTE — Telephone Encounter (Signed)
Discussed with Jinny Blossom, [place] to confirm we are to stop amlodipine as well.  Returned call to patient and made her aware of the recommendations to stop amlodipine, spironolactone, and lasix and start chlorthalidone 25 mg QD. BMET ordered and scheduled for Monday 10/22. Patient verbalized understanding and thanked me for the call.

## 2016-10-26 NOTE — Telephone Encounter (Signed)
Follow up   Pt is returning call    

## 2016-11-07 ENCOUNTER — Other Ambulatory Visit: Payer: Federal, State, Local not specified - PPO

## 2016-12-05 DIAGNOSIS — T7840XA Allergy, unspecified, initial encounter: Secondary | ICD-10-CM | POA: Insufficient documentation

## 2016-12-05 DIAGNOSIS — G44229 Chronic tension-type headache, not intractable: Secondary | ICD-10-CM | POA: Insufficient documentation

## 2016-12-05 DIAGNOSIS — N200 Calculus of kidney: Secondary | ICD-10-CM | POA: Insufficient documentation

## 2016-12-05 DIAGNOSIS — R002 Palpitations: Secondary | ICD-10-CM | POA: Insufficient documentation

## 2016-12-05 DIAGNOSIS — I10 Essential (primary) hypertension: Secondary | ICD-10-CM | POA: Insufficient documentation

## 2016-12-05 DIAGNOSIS — Z8619 Personal history of other infectious and parasitic diseases: Secondary | ICD-10-CM | POA: Insufficient documentation

## 2016-12-05 DIAGNOSIS — E079 Disorder of thyroid, unspecified: Secondary | ICD-10-CM | POA: Insufficient documentation

## 2016-12-05 DIAGNOSIS — R55 Syncope and collapse: Secondary | ICD-10-CM | POA: Insufficient documentation

## 2016-12-16 ENCOUNTER — Ambulatory Visit: Payer: Federal, State, Local not specified - PPO | Admitting: Internal Medicine

## 2017-01-02 ENCOUNTER — Encounter (HOSPITAL_COMMUNITY): Payer: Self-pay | Admitting: Urgent Care

## 2017-01-02 ENCOUNTER — Emergency Department (HOSPITAL_COMMUNITY)
Admission: EM | Admit: 2017-01-02 | Discharge: 2017-01-02 | Disposition: A | Discharge status: Home / Self Care | Payer: Federal, State, Local not specified - PPO | Attending: Emergency Medicine | Admitting: Emergency Medicine

## 2017-01-02 ENCOUNTER — Ambulatory Visit (HOSPITAL_COMMUNITY): Payer: Federal, State, Local not specified - PPO

## 2017-01-02 ENCOUNTER — Ambulatory Visit (HOSPITAL_COMMUNITY)
Admission: EM | Admit: 2017-01-02 | Discharge: 2017-01-02 | Disposition: A | Discharge status: Other Acute Inpatient Hospital | Payer: Federal, State, Local not specified - PPO | Source: Home / Self Care | Attending: Urgent Care | Admitting: Urgent Care

## 2017-01-02 ENCOUNTER — Other Ambulatory Visit: Payer: Self-pay

## 2017-01-02 ENCOUNTER — Encounter (HOSPITAL_COMMUNITY): Payer: Self-pay

## 2017-01-02 ENCOUNTER — Emergency Department (HOSPITAL_COMMUNITY): Payer: Federal, State, Local not specified - PPO

## 2017-01-02 DIAGNOSIS — I10 Essential (primary) hypertension: Secondary | ICD-10-CM

## 2017-01-02 DIAGNOSIS — R0602 Shortness of breath: Secondary | ICD-10-CM | POA: Diagnosis not present

## 2017-01-02 DIAGNOSIS — R05 Cough: Secondary | ICD-10-CM

## 2017-01-02 DIAGNOSIS — R1084 Generalized abdominal pain: Secondary | ICD-10-CM

## 2017-01-02 DIAGNOSIS — J101 Influenza due to other identified influenza virus with other respiratory manifestations: Secondary | ICD-10-CM

## 2017-01-02 DIAGNOSIS — R5381 Other malaise: Secondary | ICD-10-CM

## 2017-01-02 DIAGNOSIS — E079 Disorder of thyroid, unspecified: Secondary | ICD-10-CM | POA: Insufficient documentation

## 2017-01-02 DIAGNOSIS — J09X2 Influenza due to identified novel influenza A virus with other respiratory manifestations: Secondary | ICD-10-CM | POA: Insufficient documentation

## 2017-01-02 DIAGNOSIS — Z79899 Other long term (current) drug therapy: Secondary | ICD-10-CM | POA: Insufficient documentation

## 2017-01-02 DIAGNOSIS — R1110 Vomiting, unspecified: Secondary | ICD-10-CM | POA: Diagnosis not present

## 2017-01-02 DIAGNOSIS — R0989 Other specified symptoms and signs involving the circulatory and respiratory systems: Secondary | ICD-10-CM | POA: Diagnosis not present

## 2017-01-02 DIAGNOSIS — R110 Nausea: Secondary | ICD-10-CM

## 2017-01-02 DIAGNOSIS — R059 Cough, unspecified: Secondary | ICD-10-CM

## 2017-01-02 LAB — COMPREHENSIVE METABOLIC PANEL
ALT: 25 U/L (ref 14–54)
AST: 34 U/L (ref 15–41)
Albumin: 3.5 g/dL (ref 3.5–5.0)
Alkaline Phosphatase: 65 U/L (ref 38–126)
Anion gap: 13 (ref 5–15)
BUN: 13 mg/dL (ref 6–20)
CO2: 26 mmol/L (ref 22–32)
Calcium: 8.4 mg/dL — ABNORMAL LOW (ref 8.9–10.3)
Chloride: 94 mmol/L — ABNORMAL LOW (ref 101–111)
Creatinine, Ser: 0.89 mg/dL (ref 0.44–1.00)
GFR calc Af Amer: >60 mL/min (ref >60)
GFR calc non Af Amer: >60 mL/min (ref >60)
Glucose, Bld: 101 mg/dL — ABNORMAL HIGH (ref 65–99)
Potassium: 3 mmol/L — ABNORMAL LOW (ref 3.5–5.1)
Sodium: 133 mmol/L — ABNORMAL LOW (ref 135–145)
Total Bilirubin: 1.7 mg/dL — ABNORMAL HIGH (ref 0.3–1.2)
Total Protein: 7 g/dL (ref 6.5–8.1)

## 2017-01-02 LAB — CBC WITH DIFFERENTIAL/PLATELET
Basophils Absolute: 0 10*3/uL (ref 0.0–0.1)
Basophils Relative: 0 %
Eosinophils Absolute: 0 10*3/uL (ref 0.0–0.7)
Eosinophils Relative: 0 %
HCT: 41.6 % (ref 36.0–46.0)
Hemoglobin: 14.2 g/dL (ref 12.0–15.0)
Lymphocytes Relative: 13 %
Lymphs Abs: 1 10*3/uL (ref 0.7–4.0)
MCH: 31.3 pg (ref 26.0–34.0)
MCHC: 34.1 g/dL (ref 30.0–36.0)
MCV: 91.6 fL (ref 78.0–100.0)
Monocytes Absolute: 0.9 10*3/uL (ref 0.1–1.0)
Monocytes Relative: 11 %
Neutro Abs: 6 10*3/uL (ref 1.7–7.7)
Neutrophils Relative %: 76 %
Platelets: 171 10*3/uL (ref 150–400)
RBC: 4.54 MIL/uL (ref 3.87–5.11)
RDW: 12.7 % (ref 11.5–15.5)
WBC: 7.9 10*3/uL (ref 4.0–10.5)

## 2017-01-02 LAB — POCT I-STAT, CHEM 8
BUN: 13 mg/dL (ref 6–20)
Calcium, Ion: 1.04 mmol/L — ABNORMAL LOW (ref 1.15–1.40)
Chloride: 92 mmol/L — ABNORMAL LOW (ref 101–111)
Creatinine, Ser: 0.8 mg/dL (ref 0.44–1.00)
Glucose, Bld: 121 mg/dL — ABNORMAL HIGH (ref 65–99)
HCT: 44 % (ref 36.0–46.0)
Hemoglobin: 15 g/dL (ref 12.0–15.0)
Potassium: 3.2 mmol/L — ABNORMAL LOW (ref 3.5–5.1)
Sodium: 134 mmol/L — ABNORMAL LOW (ref 135–145)
TCO2: 26 mmol/L (ref 22–32)

## 2017-01-02 LAB — INFLUENZA PANEL BY PCR (TYPE A & B)
Influenza A By PCR: POSITIVE — AB
Influenza B By PCR: NEGATIVE

## 2017-01-02 LAB — URINALYSIS, ROUTINE W REFLEX MICROSCOPIC
Bilirubin Urine: NEGATIVE
Glucose, UA: NEGATIVE mg/dL
Hgb urine dipstick: NEGATIVE
Ketones, ur: 20 mg/dL — AB
Leukocytes, UA: NEGATIVE
Nitrite: NEGATIVE
Protein, ur: NEGATIVE mg/dL
Specific Gravity, Urine: 1.009 (ref 1.005–1.030)
pH: 6 (ref 5.0–8.0)

## 2017-01-02 LAB — I-STAT CG4 LACTIC ACID, ED
Lactic Acid, Venous: 1.42 mmol/L (ref 0.5–1.9)
Lactic Acid, Venous: 0.86 mmol/L (ref 0.5–1.9)

## 2017-01-02 LAB — TROPONIN I: Troponin I: <0.03 ng/mL (ref <0.03)

## 2017-01-02 LAB — LIPASE, BLOOD: Lipase: 26 U/L (ref 11–51)

## 2017-01-02 MED ORDER — ALBUTEROL SULFATE (2.5 MG/3ML) 0.083% IN NEBU
5.0000 mg | INHALATION_SOLUTION | Freq: Once | RESPIRATORY_TRACT | Status: AC
  Administered 2017-01-02: 5 mg via RESPIRATORY_TRACT
  Filled 2017-01-02: qty 6

## 2017-01-02 MED ORDER — ONDANSETRON HCL 4 MG/2ML IJ SOLN: 4.0000 mg | Freq: Once | INTRAMUSCULAR | Status: DC

## 2017-01-02 MED ORDER — ACETAMINOPHEN 500 MG PO TABS
1000.0000 mg | ORAL_TABLET | Freq: Once | ORAL | Status: AC
  Administered 2017-01-02: 1000 mg via ORAL
  Filled 2017-01-02: qty 2

## 2017-01-02 MED ORDER — AEROCHAMBER PLUS FLO-VU LARGE MISC
Status: AC
  Administered 2017-01-02: 1
  Filled 2017-01-02: qty 1

## 2017-01-02 MED ORDER — ALBUTEROL SULFATE HFA 108 (90 BASE) MCG/ACT IN AERS
2.0000 | INHALATION_SPRAY | RESPIRATORY_TRACT | Status: DC | PRN
  Administered 2017-01-02: 2 via RESPIRATORY_TRACT
  Filled 2017-01-02: qty 6.7

## 2017-01-02 MED ORDER — IPRATROPIUM-ALBUTEROL 0.5-2.5 (3) MG/3ML IN SOLN
3.0000 mL | Freq: Once | RESPIRATORY_TRACT | Status: AC
  Administered 2017-01-02: 3 mL via RESPIRATORY_TRACT
  Filled 2017-01-02: qty 3

## 2017-01-02 MED ORDER — OSELTAMIVIR PHOSPHATE 75 MG PO CAPS
75.0000 mg | ORAL_CAPSULE | Freq: Once | ORAL | Status: AC
  Administered 2017-01-02: 75 mg via ORAL
  Filled 2017-01-02: qty 1

## 2017-01-02 MED ORDER — POTASSIUM CHLORIDE CRYS ER 20 MEQ PO TBCR
20.0000 meq | EXTENDED_RELEASE_TABLET | Freq: Once | ORAL | Status: AC
  Administered 2017-01-02: 20 meq via ORAL
  Filled 2017-01-02: qty 1

## 2017-01-02 MED ORDER — ONDANSETRON 8 MG PO TBDP: 8.0000 mg | ORAL_TABLET | Freq: Three times a day (TID) | ORAL | 0 refills | Status: DC | PRN

## 2017-01-02 MED ORDER — OSELTAMIVIR PHOSPHATE 75 MG PO CAPS: 75.0000 mg | ORAL_CAPSULE | Freq: Two times a day (BID) | ORAL | 0 refills | Status: DC

## 2017-01-02 MED ORDER — ASPIRIN 81 MG PO CHEW
CHEWABLE_TABLET | ORAL | Status: AC
  Filled 2017-01-02: qty 4

## 2017-01-02 MED ORDER — NITROGLYCERIN 0.4 MG SL SUBL
SUBLINGUAL_TABLET | SUBLINGUAL | Status: AC
  Filled 2017-01-02: qty 1

## 2017-01-02 MED ORDER — NITROGLYCERIN 0.4 MG SL SUBL
0.4000 mg | SUBLINGUAL_TABLET | SUBLINGUAL | Status: DC | PRN
  Administered 2017-01-02: 0.4 mg via SUBLINGUAL

## 2017-01-02 MED ORDER — IBUPROFEN 400 MG PO TABS
600.0000 mg | ORAL_TABLET | Freq: Once | ORAL | Status: AC
  Administered 2017-01-02: 600 mg via ORAL
  Filled 2017-01-02: qty 1

## 2017-01-02 MED ORDER — POTASSIUM CHLORIDE CRYS ER 20 MEQ PO TBCR: 20.0000 meq | EXTENDED_RELEASE_TABLET | Freq: Two times a day (BID) | ORAL | 0 refills | Status: DC

## 2017-01-02 MED ORDER — ASPIRIN 81 MG PO CHEW
324.0000 mg | CHEWABLE_TABLET | Freq: Once | ORAL | Status: AC
  Administered 2017-01-02: 324 mg via ORAL

## 2017-01-02 MED ORDER — AEROCHAMBER PLUS FLO-VU LARGE MISC
1.0000 | Freq: Once | Status: AC
  Administered 2017-01-02: 1

## 2017-01-02 NOTE — ED Provider Notes (Signed)
MRN: [id number] DOB: 11-01-1955  Subjective:   Alicia [name] is a 61 y.o. female presenting for 3 day history of nausea without vomiting, is dry-heaving, lower abdominal pain, decreased, loose stools (~3 BM per day), fever (highest was 101F). Belly pain is achy and cramping-type sensation, rates belly pain at 8/10. Has not tried any medications for relief. Denies bloody stools, vomiting, chest pain. Denies history of heart disease, MI. Has a history of HTN, takes chlorthalidone for this, BP readings at home are 657'Q systolic. Denies smoking cigarettes.    No current facility-administered medications for this encounter.   Current Outpatient Medications:  .  chlorthalidone (HYGROTON) 25 MG tablet, Take 1 tablet (25 mg total) by mouth daily., Disp: 30 tablet, Rfl: 11 .  ALPRAZolam (XANAX PO), Take by mouth. USES BEFORE DENTAL VISITS., Disp: , Rfl:  .  Multiple Vitamin (MULTIVITAMIN WITH MINERALS) TABS tablet, Take 1 tablet by mouth daily. Women's formula, Disp: , Rfl:  .  Pyridoxine HCl (VITAMIN B-6 PO), Take by mouth as directed., Disp: , Rfl:    Alicia [name] is allergic to contrast media [iodinated diagnostic agents]; iohexol; prednisone; valsartan; morphine; shellfish allergy; sulfa antibiotics; ciprofloxacin; penicillins; sulfamethoxazole-trimethoprim; and sulfonamide derivatives.  Alicia [name]  has a past medical history of Allergy, Chronic tension headaches, varicella, Hypertension, Kidney stones, Near syncope, Nephrolithiasis, Palpitations, Thyroid disease, and Varicose veins. Also  has a past surgical history that includes Abdominal hysterectomy; Cholecystectomy; Tonsillectomy; cataract surgery (Right, 03-02-2012); and Cataract extraction.  Objective:   Vitals: BP (!) 123/53 (BP Location: Left Arm)   Pulse 80   Temp 99.9 F (37.7 C) (Oral)   SpO2 96%   BP Readings from Last 3 Encounters:  01/02/17 (!) 123/53  10/06/16 (!) 142/86  07/12/16 140/80    Physical Exam  Constitutional: She is  oriented to person, place, and time. She appears well-developed and well-nourished.  HENT:  Mouth/Throat: Oropharynx is clear and moist.  Eyes: Pupils are equal, round, and reactive to light. No scleral icterus.  Cardiovascular: Normal rate, regular rhythm and intact distal pulses. Exam reveals no gallop and no friction rub.  No murmur heard. Pulmonary/Chest: No respiratory distress. She has no wheezes. She has no rales.  Abdominal: Soft. Bowel sounds are normal. She exhibits no distension and no mass. There is tenderness (mid-abdomen). There is no guarding.  Neurological: She is alert and oriented to person, place, and time.  Skin: Skin is warm and dry.   ED ECG REPORT   Date: 01/02/2017  Rate: 72bpm  Rhythm: normal sinus rhythm  QRS Axis: normal  Intervals: QT prolonged, 740/822ms  ST/T Wave abnormalities: ST depression in anterior lateral leads V2-V4, also has ST depression in Lead II  Conduction Disutrbances:none  Narrative Interpretation:   Old EKG Reviewed: changes noted and compared to ECG from 05/25/2016  I have personally reviewed the EKG tracing and agree with the computerized printout as noted.  Results for orders placed or performed during the hospital encounter of 01/02/17 (from the past 24 hour(s))  I-STAT, chem 8     Status: Abnormal   Collection Time: 01/02/17 11:32 AM  Result Value Ref Range   Sodium 134 (L) 135 - 145 mmol/L   Potassium 3.2 (L) 3.5 - 5.1 mmol/L   Chloride 92 (L) 101 - 111 mmol/L   BUN 13 6 - 20 mg/dL   Creatinine, Ser 0.80 0.44 - 1.00 mg/dL   Glucose, Bld 121 (H) 65 - 99 mg/dL   Calcium, Ion 1.04 (L) 1.15 - 1.40 mmol/L  TCO2 26 22 - 32 mmol/L   Hemoglobin 15.0 12.0 - 15.0 g/dL   HCT 44.0 36.0 - 46.0 %   Assessment and Plan :   Case precepted with Dr. Mannie Stabile.  Generalized abdominal pain  Cough  Malaise  Nausea without vomiting  Essential hypertension   Will send to [facility] ER for ACS. Patient received aspirin PO, SL nitro, IV line  established in clinic.    Jaynee Eagles, [place] 01/02/17 1158

## 2017-01-02 NOTE — ED Triage Notes (Signed)
Pt coming from [facility] via [facility]. She initially checked in for nausea/vomiting/abd pain. She was sent here for further evaluation of an abnormal EKG. Pt alert and oriented.

## 2017-01-02 NOTE — Discharge Instructions (Signed)
Please drink plenty of fluids.  Take tamiflu as prescribed. Use inhaler two puff every four hours as needed for cough and dyspnea. Return if you are worse at any time. Take potassium as prescribed and recheck of potassium with your doctor 2-3 days. Recheck of ekg with your doctor when improved.  Return if any chest pain.

## 2017-01-02 NOTE — ED Provider Notes (Addendum)
61 year old female presents today with cough fever, nausea, diarrhea.  She has some dyspnea here with sats around 94%.  She received albuterol inhaler with improvement of her symptoms.  She has a positive flu test for flu A.  She is receiving Tamiflu and albuterol here.  She is advised regarding return precautions and need for close follow-up and voices understanding  mild hypokalemia with oral repletion started here ekg with nsst changes no evidence of ischemia Pattricia Boss, MD 01/02/17 2010    Pattricia Boss, MD 01/02/17 2019

## 2017-01-02 NOTE — ED Triage Notes (Signed)
Per pt she got sick Saturday night, abd pain, cant not eat anything nauseous, throat sore, per pt she's having "thick" Mucus,

## 2017-01-02 NOTE — ED Notes (Signed)
[facility] at bedside 

## 2017-01-02 NOTE — ED Provider Notes (Signed)
[facility] EMERGENCY DEPARTMENT Provider Note   CSN: [id number] Arrival date & time: 01/02/17  1215     History   Chief Complaint Chief Complaint  Patient presents with  . Emesis  . Abnormal ECG    HPI Alicia [name] is a 61 y.o. female.  HPI Patient presents to the emergency department with cough, vomiting, sore throat, nasal congestion, and fever.  Patient initially went to the urgent care because of the vomiting and fever and was sent here for EKG changes.  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  Patient did not take any medications prior to arrival for her symptoms.  Patient states that this is make the condition better or worse.  The patient denies chest pain, shortness of breath, headache,blurred vision, neck pain, weakness, numbness, dizziness, anorexia, edema, abdominal pain,rash, back pain, dysuria, hematemesis, bloody stool, near syncope, or syncope. Past Medical History:  Diagnosis Date  . Allergy   . Chronic tension headaches   . Hx of varicella   . Hypertension   . Kidney stones   . Near syncope   . Nephrolithiasis    history of  passed on own.   . Palpitations   . Thyroid disease   . Varicose veins     Patient Active Problem List   Diagnosis Date Noted  . Thyroid disease   . Palpitations   . Nephrolithiasis   . Near syncope   . Kidney stones   . Hypertension   . Hx of varicella   . Chronic tension headaches   . Allergy   . Nocturnal headaches 05/04/2016  . Coccygodynia 01/29/2015  . Atypical chest pain 09/09/2014  . Essential hypertension 09/09/2014  . Midline low back pain without sciatica 09/05/2013  . Renal cyst 09/05/2013  . Fatty infiltration of liver 09/05/2013  . Umbilical hernia without obstruction and without gangrene 09/05/2013  . Flank pain 07/29/2013  . Encounter to establish care with new doctor 07/11/2013  . Left leg pain 07/11/2013  . Cough 01/22/2013  . Swelling in head/neck 10/31/2012  .  Routine health maintenance 04/05/2012  . Unspecified vitamin D deficiency 04/04/2012  . Varicose veins of lower extremities with other complications 95/62/1308  . Chronic venous insufficiency 09/02/2011  . Allergic rhinitis, cause unspecified 06/08/2011  . Paresthesia of left arm 06/08/2011  . Essential hypertension, benign 03/26/2010  . OBESITY, CLASS III 10/02/2009  . Other and unspecified coagulation defects 04/03/2009  . PERIPHERAL EDEMA 10/22/2008  . HEMORRHOIDS, INTERNAL, WITH BLEEDING 05/07/2008  . NEPHROLITHIASIS, HX OF 04/28/2008  . SYNCOPE 04/19/2007  . ANXIETY DEPRESSION 10/31/2006  . HYSTERECTOMY, VAGINAL, HX OF 10/31/2006    Past Surgical History:  Procedure Laterality Date  . ABDOMINAL HYSTERECTOMY     fibroids non cancer   . CATARACT EXTRACTION     Bilateral  . cataract surgery Right 03-02-2012  . CHOLECYSTECTOMY    . TONSILLECTOMY      OB History    Gravida Para Term Preterm AB Living   3 3 3          SAB TAB Ectopic Multiple Live Births                   Home Medications    Prior to Admission medications   Medication Sig Start Date End Date Taking? Authorizing Provider  acetaminophen (TYLENOL) 325 MG tablet Take 650 mg by mouth every 6 (six) hours as needed for mild pain.   Yes [provider]  chlorthalidone (  HYGROTON) 25 MG tablet Take 1 tablet (25 mg total) by mouth daily. 10/26/16  Yes Supple, Megan E, RPH  Multiple Vitamin (MULTIVITAMIN WITH MINERALS) TABS tablet Take 1 tablet by mouth daily. Women's formula   Yes [provider]  Pyridoxine HCl (VITAMIN B-6 PO) Take 1 tablet by mouth daily.    Yes [provider]    Family History Family History  Problem Relation Age of Onset  . Cancer Mother        CERVICAL uterin cancer   . Liver disease Mother        from chronic heaptitis   . Diabetes Mother   . Heart disease Father        avdisease avr  . Stroke Father   . Cancer Sister        Ovarian and endometrial  .  Cancer Maternal Aunt        Colon  . Heart disease Maternal Grandfather   . Heart disease Paternal Grandmother   . Heart disease Paternal Grandfather   . Diabetes Sister   . Diabetes Brother     Social History Social History   Tobacco Use  . Smoking status: Never Smoker  . Smokeless tobacco: Never Used  Substance Use Topics  . Alcohol use: No  . Drug use: No     Allergies   Contrast media [iodinated diagnostic agents]; Iohexol; Morphine; Prednisone; Valsartan; Lidocaine hcl; Shellfish allergy; Sulfa antibiotics; Ciprofloxacin; Penicillins; Sulfamethoxazole-trimethoprim; and Sulfonamide derivatives   Review of Systems Review of Systems All other systems negative except as documented in the HPI. All pertinent positives and negatives as reviewed in the HPI.  Physical Exam Updated Vital Signs BP 101/63   Pulse 73   Temp (!) 101.4 F (38.6 C) (Oral)   Resp 13   SpO2 96%   Physical Exam  Constitutional: She is oriented to person, place, and time. She appears well-developed and well-nourished. No distress.  HENT:  Head: Normocephalic and atraumatic.  Mouth/Throat: Oropharynx is clear and moist.  Eyes: Pupils are equal, round, and reactive to light.  Neck: Normal range of motion. Neck supple.  Cardiovascular: Normal rate, regular rhythm and normal heart sounds. Exam reveals no gallop and no friction rub.  No murmur heard. Pulmonary/Chest: Effort normal. No respiratory distress. She has decreased breath sounds in the right lower field and the left lower field. She has no wheezes.  Abdominal: Soft. Bowel sounds are normal. She exhibits no distension. There is no tenderness.  Neurological: She is alert and oriented to person, place, and time. She exhibits normal muscle tone. Coordination normal.  Skin: Skin is warm and dry. Capillary refill takes less than 2 seconds. No rash noted. No erythema.  Psychiatric: She has a normal mood and affect. Her behavior is normal.  Nursing  note and vitals reviewed.    ED Treatments / Results  Labs (all labs ordered are listed, but only abnormal results are displayed) Labs Reviewed  COMPREHENSIVE METABOLIC PANEL - Abnormal; Notable for the following components:      Result Value   Sodium 133 (*)    Potassium 3.0 (*)    Chloride 94 (*)    Glucose, Bld 101 (*)    Calcium 8.4 (*)    Total Bilirubin 1.7 (*)    All other components within normal limits  URINALYSIS, ROUTINE W REFLEX MICROSCOPIC - Abnormal; Notable for the following components:   APPearance HAZY (*)    Ketones, ur 20 (*)    All other components  within normal limits  URINE CULTURE  CBC WITH DIFFERENTIAL/PLATELET  TROPONIN I  LIPASE, BLOOD  INFLUENZA PANEL BY PCR (TYPE A & B)  I-STAT CG4 LACTIC ACID, ED  I-STAT CG4 LACTIC ACID, ED    EKG  EKG Interpretation None       Radiology Dg Chest 2 View  Result Date: 01/02/2017 CLINICAL DATA:  Cough, chest congestion, and shortness of breath for the past 3 days. Abnormal EKG today. History of asthma, hypertension, never smoked. EXAM: CHEST  2 VIEW COMPARISON:  PA and lateral chest x-ray of July 12, 2016 FINDINGS: The lungs are adequately inflated. There is linear subsegmental atelectasis just above the left hemidiaphragm laterally. The heart and pulmonary vascularity are normal. The mediastinum is normal in width. The trachea is midline. There is no pleural effusion IMPRESSION: Subsegmental atelectasis at the left lung base laterally. No pneumonia, CHF, nor other acute cardiopulmonary abnormality. Electronically Signed   By: David  Martinique M.D.   On: 01/02/2017 13:28    Procedures Procedures (including critical care time)  Medications Ordered in ED Medications  acetaminophen (TYLENOL) tablet 1,000 mg (1,000 mg Oral Given 01/02/17 1426)     Initial Impression / Assessment and Plan / ED Course  I have reviewed the triage vital signs and the nursing notes.  Pertinent labs & imaging results that were  available during my care of the patient were reviewed by me and considered in my medical decision making (see chart for details).     Patient may have influenza.  The patient does not have any chest pain or other cardiac findings on physical exam or history.  The patient does have nausea vomiting abdominal discomfort cough nasal congestion and sore throat.  The patient will be ambulated to assess her breathing status.  The patient's EKG does have more pronounced T wave depressions but in the same morphology she had back in  Final Clinical Impressions(s) / ED Diagnoses   Final diagnoses:  None    ED Discharge Orders    None       Dalia Heading, PA-C 01/02/17 1659    Carmin Muskrat, MD 01/06/17 2130

## 2017-01-02 NOTE — ED Notes (Signed)
Patient transported to X-ray 

## 2017-01-02 NOTE — ED Notes (Signed)
Pt ambulated, while checking O2, around Pod B and around Pod D back to room. Pt's O2 started at 96%, while ambulating pt's O2 dropped to 93% and jumped back up to 96%. Nurse and PA notified.

## 2017-01-02 NOTE — ED Notes (Signed)
First iv attempt, right forearm/ac.  Positive for blood return, unable to advance and puffing at insertion site.  Catheter removed and intact.  Pressure held to site Second attempt, left forearm.  Successful and fluid infusing without difficulty.

## 2017-01-03 LAB — URINE CULTURE

## 2017-01-04 ENCOUNTER — Telehealth: Payer: Self-pay | Admitting: Internal Medicine

## 2017-01-04 DIAGNOSIS — E876 Hypokalemia: Secondary | ICD-10-CM

## 2017-01-04 NOTE — Telephone Encounter (Signed)
Come in Friday for BMET and also EKG

## 2017-01-04 NOTE — Telephone Encounter (Signed)
Pt has flu, went to urgent care.  Was sent to ER for abn EKG, was instructed to follow up with Dr. Harrington Challenger for this.  Scheduled with APP on 01/23/17.  Her potassium was 3.0 ER.  Received oral replacement and went home with prescription.  She will place tablets in applesauce or allow to dissolve in liquids to help with swallowing them. Advised that hypokalemia can cause EKG changes.  She is increasing fluid intake but is still unable to eat more than bites at a time.  Does not like V8.  Will try Gatorade.   Pt aware I will ask Dr. Harrington Challenger to review EKG and if pt should be seen sooner than 01/23/17 or if she should have labs in the meantime we will call her back.  Tolerating chlorithalidone for BP, swelling in ankles is better.

## 2017-01-04 NOTE — Telephone Encounter (Signed)
Alicia [name] is wanting to know if their is an alternative for the potassium pill . States it a rather large pill and it is making her nausea. Want to know can she break in half . Please call

## 2017-01-05 NOTE — Telephone Encounter (Signed)
Called patient who will come 12/21 for EKG and BMET, 9 am nurse visit and lab appointment made.

## 2017-01-06 ENCOUNTER — Ambulatory Visit (INDEPENDENT_AMBULATORY_CARE_PROVIDER_SITE_OTHER): Payer: Federal, State, Local not specified - PPO | Admitting: *Deleted

## 2017-01-06 ENCOUNTER — Other Ambulatory Visit: Payer: Federal, State, Local not specified - PPO | Admitting: *Deleted

## 2017-01-06 VITALS — BP 119/75 | HR 59 | Temp 98.2°F | Wt 250.4 lb

## 2017-01-06 DIAGNOSIS — E876 Hypokalemia: Secondary | ICD-10-CM

## 2017-01-06 DIAGNOSIS — R9431 Abnormal electrocardiogram [ECG] [EKG]: Secondary | ICD-10-CM

## 2017-01-06 NOTE — Progress Notes (Signed)
1.) Reason for visit: EKG and BMET  2.) Name of MD requesting visit: Dr. Harrington Challenger  3.) H&P: abnormal EKG and potassium at ER on 12/17. Positive Influenza A  4.) ROS related to problem: Completed course of potassium chloride, taking fluids well, flu symptoms improving;   5.) Assessment and plan per MD: EKG reviewed by Dr. Lovena Le (DOD) and will be scanned into Epic.  Awaiting results of BMET.

## 2017-01-09 ENCOUNTER — Telehealth: Payer: Self-pay | Admitting: Internal Medicine

## 2017-01-09 DIAGNOSIS — E876 Hypokalemia: Secondary | ICD-10-CM

## 2017-01-09 LAB — BASIC METABOLIC PANEL
BUN/Creatinine Ratio: 19 (ref 12–28)
BUN: 14 mg/dL (ref 8–27)
CO2: 28 mmol/L (ref 20–29)
Calcium: 8.8 mg/dL (ref 8.7–10.3)
Chloride: 94 mmol/L — ABNORMAL LOW (ref 96–106)
Creatinine, Ser: 0.72 mg/dL (ref 0.57–1.00)
GFR calc Af Amer: 105 mL/min/{1.73_m2} (ref >59)
GFR calc non Af Amer: 91 mL/min/{1.73_m2} (ref >59)
Glucose: 130 mg/dL — ABNORMAL HIGH (ref 65–99)
Potassium: 3.1 mmol/L — ABNORMAL LOW (ref 3.5–5.2)
Sodium: 138 mmol/L (ref 134–144)

## 2017-01-09 MED ORDER — POTASSIUM CHLORIDE CRYS ER 20 MEQ PO TBCR: EXTENDED_RELEASE_TABLET | ORAL | 0 refills | Status: DC

## 2017-01-09 NOTE — Telephone Encounter (Signed)
[name] is returning a call. Thanks

## 2017-01-09 NOTE — Telephone Encounter (Signed)
K=3.1 on lab done 01/06/17-see lab results.   Notes recorded by Katrine Coho, RN on 01/09/2017 at 8:40 AM EST Pt states she took KCL for 5 doses starting 01/02/17, she is not currently taking KCL supplement.   Per Dr Cathrine Muster KCL 20 mEq bid today, tomorrow, and Wednesday, recheck BMET on Thursday 01/12/17.   I discussed with patient, she verbalized understanding, agreed with plan.

## 2017-01-12 ENCOUNTER — Other Ambulatory Visit: Payer: Federal, State, Local not specified - PPO | Admitting: *Deleted

## 2017-01-12 DIAGNOSIS — E876 Hypokalemia: Secondary | ICD-10-CM | POA: Diagnosis not present

## 2017-01-12 LAB — BASIC METABOLIC PANEL
BUN/Creatinine Ratio: 19 (ref 12–28)
BUN: 12 mg/dL (ref 8–27)
CO2: 27 mmol/L (ref 20–29)
Calcium: 9 mg/dL (ref 8.7–10.3)
Chloride: 101 mmol/L (ref 96–106)
Creatinine, Ser: 0.63 mg/dL (ref 0.57–1.00)
GFR calc Af Amer: 113 mL/min/{1.73_m2} (ref >59)
GFR calc non Af Amer: 98 mL/min/{1.73_m2} (ref >59)
Glucose: 94 mg/dL (ref 65–99)
Potassium: 3.7 mmol/L (ref 3.5–5.2)
Sodium: 143 mmol/L (ref 134–144)

## 2017-01-13 ENCOUNTER — Telehealth: Payer: Self-pay

## 2017-01-13 MED ORDER — POTASSIUM CHLORIDE CRYS ER 20 MEQ PO TBCR: 20.0000 meq | EXTENDED_RELEASE_TABLET | Freq: Two times a day (BID) | ORAL | 3 refills | Status: DC

## 2017-01-13 NOTE — Telephone Encounter (Signed)
Notes recorded by Teressa Senter, RN on 01/13/2017 at 5:07 PM EST Per Dr. Harrington Challenger take kdur 20 meq twice a day and patient is scheduled for a BMET on 02/16/17. Patient in agreement with plan, order sent to pharmacy, pt verbalized understanding and thanked me for the call.

## 2017-01-13 NOTE — Telephone Encounter (Signed)
-----   Message from Loren Racer, LPN sent at 86/38/1771  4:39 PM EST -----   ----- Message ----- From: Fay Records, MD Sent: 01/13/2017   4:20 PM To: Loren Racer, LPN  I would  recomm she keep on 20 bid from now on   BMET in 3 wks

## 2017-01-18 ENCOUNTER — Telehealth: Payer: Self-pay | Admitting: Internal Medicine

## 2017-01-18 NOTE — Telephone Encounter (Signed)
I spoke with pt and told her potassium is 20 meq twice daily.

## 2017-01-18 NOTE — Telephone Encounter (Signed)
Pt c/o medication issue:  1. Name of Medication: potassium chloride SA (K-DUR,KLOR-CON) 20 MEQ tablet  2. How are you currently taking this medication (dosage and times per day)? Take 1 tablet (20 mEq total) by mouth 2 (two) times daily.  3. Are you having a reaction (difficulty breathing--STAT)? no  4. What is your medication issue? Pt want to go over how this medication should be taken she said that she has two different instructions

## 2017-01-23 ENCOUNTER — Encounter: Payer: Self-pay | Admitting: Physician Assistant

## 2017-01-23 ENCOUNTER — Ambulatory Visit: Payer: Federal, State, Local not specified - PPO | Admitting: Physician Assistant

## 2017-01-23 VITALS — BP 122/76 | HR 68 | Ht 66.0 in | Wt 253.1 lb

## 2017-01-23 DIAGNOSIS — I10 Essential (primary) hypertension: Secondary | ICD-10-CM

## 2017-01-23 DIAGNOSIS — E876 Hypokalemia: Secondary | ICD-10-CM | POA: Diagnosis not present

## 2017-01-23 DIAGNOSIS — R072 Precordial pain: Secondary | ICD-10-CM | POA: Diagnosis not present

## 2017-01-23 DIAGNOSIS — R9431 Abnormal electrocardiogram [ECG] [EKG]: Secondary | ICD-10-CM

## 2017-01-23 NOTE — Patient Instructions (Addendum)
Medication Instructions:  No changes.  Continue current medications.  Labwork: Today - BMET  Testing/Procedures: Schedule an exercise nuclear stress test.  Your physician has requested that you have an exercise stress myoview. For further information please visit [email or web address]. Please follow instruction sheet, as given.  Follow-Up: Dr. Dorris Carnes or Richardson Dopp, PA-C in 3-4 weeks.  Any Other Special Instructions Will Be Listed Below (If Applicable).  If you need a refill on your cardiac medications before your next appointment, please call your pharmacy.

## 2017-01-23 NOTE — Progress Notes (Signed)
Cardiology Office Note:    Date:  01/23/2017   ID:  Alicia [name], DOB 11-28-1955, MRN [id number]  PCP:  Burnis Medin, MD  Cardiologist:  Dorris Carnes, MD   Referring MD: Burnis Medin, MD   Chief Complaint  Patient presents with  . Hospitalization Follow-up    Seen in ED for abnormal EKG during symptoms of influenza    History of Present Illness:    Alicia [name] is a 62 y.o. female with a hx of HTN, palpitations.     Last seen by Dr. Harrington Challenger September 2018.  She was noted to have a murmur and a follow-up echo demonstrated normal LV function with mild diastolic dysfunction.  She went to the emergency room on January 02, 2017 and was diagnosed with the flu.  Her potassium was low she was noted to have ECG abnormalities.  Follow-up ECG in the office was within normal limits January 06, 2017.  Potassium has been replaced and last lab January 12, 2017 demonstrated potassium 3.7.  Review of her ECG in the emergency room demonstrates inferior and anterolateral ST depression as well as QT prolongation.  Alicia [name] returns for follow-up.  She tells me that during her trip to the emergency room, she did not have chest discomfort or shortness of breath.  She had typical symptoms of the flu as well as nausea and vomiting.  She continues to feel better each day.  She still has some congestion.  She has not had exertional chest pain.  However, she did take a brisk walk a few days ago and noted some questionable discomfort in her upper chest that resolved with rest.  She has not had dyspnea, orthopnea, PND or edema.  She denies syncope.  Prior CV studies:   The following studies were reviewed today:  Echo 10/18/16 Mild LVH, EF 55-60, normal wall motion, grade 1 diastolic dysfunction  Echo 5/14 - [facility] Mild LVH, grade 2 diastolic dysfunction, EF >02, mild LAE   Holter 4/12 NSR, sinus brady, few PVCs, few episodes of SVT   Echo 10/10 Mild LVH, EF 60, normal wall motion,  mild LAE   Past Medical History:  Diagnosis Date  . Allergy   . Chronic tension headaches   . Hx of varicella   . Hypertension   . Kidney stones   . Near syncope   . Nephrolithiasis    history of  passed on own.   . Palpitations   . Thyroid disease   . Varicose veins     Past Surgical History:  Procedure Laterality Date  . ABDOMINAL HYSTERECTOMY     fibroids non cancer   . CATARACT EXTRACTION     Bilateral  . cataract surgery Right 03-02-2012  . CHOLECYSTECTOMY    . TONSILLECTOMY      Current Medications: Current Meds  Medication Sig  . acetaminophen (TYLENOL) 325 MG tablet Take 650 mg by mouth every 6 (six) hours as needed for mild pain.  . chlorthalidone (HYGROTON) 25 MG tablet Take 1 tablet (25 mg total) by mouth daily.  . Multiple Vitamin (MULTIVITAMIN WITH MINERALS) TABS tablet Take 1 tablet by mouth daily. Women's formula  . potassium chloride SA (K-DUR,KLOR-CON) 20 MEQ tablet Take 1 tablet (20 mEq total) by mouth 2 (two) times daily.     Allergies:   Contrast media [iodinated diagnostic agents]; Iohexol; Morphine; Prednisone; Valsartan; Lidocaine hcl; Shellfish allergy; Sulfa antibiotics; Ciprofloxacin; Penicillins; Sulfamethoxazole-trimethoprim; and Sulfonamide derivatives   Social History  Tobacco Use  . Smoking status: Never Smoker  . Smokeless tobacco: Never Used  Substance Use Topics  . Alcohol use: No  . Drug use: No     Family Hx: The patient's family history includes Cancer in her maternal aunt, mother, and sister; Diabetes in her brother, mother, and sister; Heart disease in her father, maternal grandfather, paternal grandfather, and paternal grandmother; Liver disease in her mother; Stroke in her father.  ROS:   Please see the history of present illness.    ROS All other systems reviewed and are negative.   EKGs/Labs/Other Test Reviewed:    EKG:  EKG is  ordered today.  The ekg ordered today demonstrates NSR, HR 67, normal axis,  nonspecific ST-T wave changes, QTC 462 ms, similar to prior tracings  Recent Labs: 07/12/2016: Pro B Natriuretic peptide (BNP) 69.0 01/02/2017: ALT 25; Hemoglobin 14.2; Platelets 171 01/12/2017: BUN 12; Creatinine, Ser 0.63; Potassium 3.7; Sodium 143   Recent Lipid Panel Lab Results  Component Value Date/Time   CHOL 190 07/17/2015 03:00 PM   TRIG 139.0 07/17/2015 03:00 PM   HDL 64.80 07/17/2015 03:00 PM   CHOLHDL 3 07/17/2015 03:00 PM   LDLCALC 98 07/17/2015 03:00 PM    Physical Exam:    VS:  BP 122/76   Pulse 68   Ht 5\' 6"  (1.676 m)   Wt 253 lb 1.9 oz (114.8 kg)   SpO2 97%   BMI 40.85 kg/m     Wt Readings from Last 3 Encounters:  01/23/17 253 lb 1.9 oz (114.8 kg)  01/06/17 250 lb 6.4 oz (113.6 kg)  10/06/16 259 lb 6.4 oz (117.7 kg)     Physical Exam  Constitutional: She is oriented to person, place, and time. She appears well-developed and well-nourished. No distress.  HENT:  Head: Normocephalic and atraumatic.  Eyes: No scleral icterus.  Neck: No JVD present.  Cardiovascular: Normal rate and regular rhythm.  No murmur heard. Pulmonary/Chest: Effort normal. She has no wheezes.  Abdominal: Soft. She exhibits no distension.  Musculoskeletal: She exhibits no edema.  Neurological: She is alert and oriented to person, place, and time.  Skin: Skin is warm and dry.    ASSESSMENT:    1. Precordial pain   2. Abnormal EKG   3. Essential hypertension   4. Hypokalemia    PLAN:    In order of problems listed above:  1. Precordial pain She has generally not had chest discomfort.  She is currently recovering from influenza.  She went for a brisk walk the other day and did notice some questionable discomfort in her chest.  She denies any associated symptoms.  She denies exertional dyspnea.  She did have a quite abnormal ECG when her potassium was low and she went to the emergency room with influenza symptoms.  Her ECG looks back to baseline now.  She did have a negative  troponin in the emergency room.  She does not have significant risk factors (no diabetes, no history of smoking, no strong family history of CAD).  However given her abnormal ECG, I think she at least needs workup with stress testing.  -Arrange ETT-Myoview  -Follow-up 3-4 weeks  2. Abnormal EKG Question if related to hypokalemia.  Proceed with stress testing as outlined above.  3. Essential hypertension The patient's blood pressure is controlled on her current regimen.  Continue current therapy.   4. Hypokalemia  Recent potassium normal.  Arrange repeat BMET today.   Dispo:  Return in about 4  weeks (around 02/20/2017) for Close Follow Up, w/ Dr. Harrington Challenger, or Richardson Dopp, PA-C.   Medication Adjustments/Labs and Tests Ordered: Current medicines are reviewed at length with the patient today.  Concerns regarding medicines are outlined above.  Tests Ordered: Orders Placed This Encounter  Procedures  . Basic metabolic panel  . Myocardial Perfusion Imaging  . EKG 12-Lead   Medication Changes: No orders of the defined types were placed in this encounter.   Signed, Richardson Dopp, PA-C  01/23/2017 5:21 PM    [facility] [address], [place], [place]  [address] Phone: [phone]; Fax: [phone]

## 2017-01-24 ENCOUNTER — Telehealth: Payer: Self-pay | Admitting: *Deleted

## 2017-01-24 LAB — BASIC METABOLIC PANEL WITH GFR
BUN/Creatinine Ratio: 21 (ref 12–28)
BUN: 15 mg/dL (ref 8–27)
CO2: 23 mmol/L (ref 20–29)
Calcium: 9 mg/dL (ref 8.7–10.3)
Chloride: 100 mmol/L (ref 96–106)
Creatinine, Ser: 0.73 mg/dL (ref 0.57–1.00)
GFR calc Af Amer: 104 mL/min/1.73 (ref >59)
GFR calc non Af Amer: 90 mL/min/1.73 (ref >59)
Glucose: 94 mg/dL (ref 65–99)
Potassium: 3.1 mmol/L — ABNORMAL LOW (ref 3.5–5.2)
Sodium: 140 mmol/L (ref 134–144)

## 2017-01-24 NOTE — Telephone Encounter (Signed)
I called and lmtcb to need to schedule ETT Myoview and go over instructions. Pt was seen yesterday by Richardson Dopp, PA though she left after she had her lab work and I did not get a chance to get her test scheduled or go over instructions. It was right a t5 pm and needed to lab work done before lab closed.

## 2017-01-25 ENCOUNTER — Telehealth: Payer: Self-pay | Admitting: *Deleted

## 2017-01-25 DIAGNOSIS — I10 Essential (primary) hypertension: Secondary | ICD-10-CM

## 2017-01-25 DIAGNOSIS — E876 Hypokalemia: Secondary | ICD-10-CM | POA: Insufficient documentation

## 2017-01-25 MED ORDER — POTASSIUM CHLORIDE CRYS ER 20 MEQ PO TBCR: 20.0000 meq | EXTENDED_RELEASE_TABLET | ORAL | 3 refills | Status: DC

## 2017-01-25 NOTE — Telephone Encounter (Signed)
Pt has been notified of lab results by phone. Pt has been advised to increase K+ to 40 meq in the AM and 20 meq in the PM, pt states she did not need a new Rx though. BMET 02/02/17.

## 2017-01-25 NOTE — Addendum Note (Signed)
Addended by: Michae Kava on: 01/25/2017 06:35 PM   Modules accepted: Orders

## 2017-01-25 NOTE — Telephone Encounter (Signed)
-----   Message from Liliane Shi, [place] sent at 01/24/2017  5:19 PM EST ----- Please call the patient Renal function normal.  Potassium remains low. PLAN:  1.  Increase potassium to 40 mEq in the a.m. and 20 mEq in the p.m. 2.  Repeat BMET 7 days Richardson Dopp, PA-C 01/24/2017 5:19 PM

## 2017-01-25 NOTE — Telephone Encounter (Signed)
S/w pt who has been scheduled for ETT Myoview next week. Went over instructions by phone with the pt which I will also place in the mail. Pt only had 1 concern which she states she s/w Richardson Dopp, PA about as well. Pt states she had about 10 yrs ago anaphylaxis reaction to contrast media and she is very apprehensive about Myoview and receiving tracer drug. Pt states PA thought she would be fine to proceed with stress test , though she is still very nervous and would like to know if someone from the Nuclear Dept would call her as well and just make sure this is ok to proceed. I states I will pass this message on to Richardson Dopp, [place] and see if we can get one of the Nuclear Techs to call her. Pt was very thankful. Pt did verbalized understanding as well to Myoview instructions.

## 2017-01-25 NOTE — Telephone Encounter (Signed)
Lmtcb on cell # x 2. I tried home # as well and s/w pt's husband who said pt was at the grocery store and should be home by 6 pm. I will call back at 6 pm before I leave tonight. I am off tomorrow and wanted to make sure pt was aware of her lab results and recommendations.

## 2017-01-25 NOTE — Telephone Encounter (Signed)
F/U Call:  Patient returning call, asks that you call back in 15 mins or after 3:30pm.

## 2017-01-25 NOTE — Telephone Encounter (Signed)
Left message to lab results and recommendations.

## 2017-01-26 ENCOUNTER — Telehealth (HOSPITAL_COMMUNITY): Payer: Self-pay | Admitting: *Deleted

## 2017-01-26 NOTE — Telephone Encounter (Signed)
Attempted calling patient back  In regards to questions about the nuclear tracer used.  No answer.

## 2017-01-26 NOTE — Telephone Encounter (Signed)
The nuclear tracer used for nuclear stress tests does not contain iodine.  Therefore, there are no concerns for allergic reaction.  Someone from our nuclear medicine department will contact the patient prior to her procedure.  I will also forward this to 1 of our nurses in nuclear medicine to make them aware of her concerns.  Richardson Dopp, PA-C 01/26/2017 10:51 AM

## 2017-01-27 ENCOUNTER — Telehealth (HOSPITAL_COMMUNITY): Payer: Self-pay | Admitting: Radiology

## 2017-01-27 NOTE — Telephone Encounter (Signed)
Patient given detailed instructions per Myocardial Perfusion Study Information Sheet for the test on 02/06/2017 at 7:45. Patient notified to arrive 15 minutes early and that it is imperative to arrive on time for appointment to keep from having the test rescheduled.  If you need to cancel or reschedule your appointment, please call the office within 24 hours of your appointment. . Patient verbalized understanding.EHK

## 2017-02-02 ENCOUNTER — Ambulatory Visit (HOSPITAL_COMMUNITY): Payer: Federal, State, Local not specified - PPO

## 2017-02-02 DIAGNOSIS — N9089 Other specified noninflammatory disorders of vulva and perineum: Secondary | ICD-10-CM | POA: Diagnosis not present

## 2017-02-02 DIAGNOSIS — L292 Pruritus vulvae: Secondary | ICD-10-CM | POA: Diagnosis not present

## 2017-02-03 ENCOUNTER — Ambulatory Visit (HOSPITAL_COMMUNITY): Payer: Federal, State, Local not specified - PPO

## 2017-02-06 ENCOUNTER — Ambulatory Visit (HOSPITAL_COMMUNITY): Payer: Federal, State, Local not specified - PPO

## 2017-02-08 ENCOUNTER — Ambulatory Visit (HOSPITAL_COMMUNITY): Payer: Federal, State, Local not specified - PPO

## 2017-02-16 ENCOUNTER — Other Ambulatory Visit: Payer: Federal, State, Local not specified - PPO | Admitting: *Deleted

## 2017-02-16 DIAGNOSIS — Z79899 Other long term (current) drug therapy: Secondary | ICD-10-CM | POA: Diagnosis not present

## 2017-02-17 LAB — BASIC METABOLIC PANEL
BUN/Creatinine Ratio: 28 (ref 12–28)
BUN: 20 mg/dL (ref 8–27)
CO2: 26 mmol/L (ref 20–29)
Calcium: 9.6 mg/dL (ref 8.7–10.3)
Chloride: 103 mmol/L (ref 96–106)
Creatinine, Ser: 0.71 mg/dL (ref 0.57–1.00)
GFR calc Af Amer: 107 mL/min/{1.73_m2} (ref >59)
GFR calc non Af Amer: 93 mL/min/{1.73_m2} (ref >59)
Glucose: 111 mg/dL — ABNORMAL HIGH (ref 65–99)
Potassium: 3.8 mmol/L (ref 3.5–5.2)
Sodium: 144 mmol/L (ref 134–144)

## 2017-02-20 ENCOUNTER — Ambulatory Visit: Payer: Federal, State, Local not specified - PPO | Admitting: Internal Medicine

## 2017-02-20 ENCOUNTER — Encounter: Payer: Self-pay | Admitting: Internal Medicine

## 2017-02-20 VITALS — BP 130/80 | HR 67 | Ht 66.0 in | Wt 255.1 lb

## 2017-02-20 DIAGNOSIS — I10 Essential (primary) hypertension: Secondary | ICD-10-CM

## 2017-02-20 NOTE — Patient Instructions (Signed)
Your physician recommends that you continue on your current medications as directed. Please refer to the Current Medication list given to you today.  Your physician recommends that you return for lab work today ([place], BMET)  Your physician recommends that you schedule a follow-up appointment in: June, 2019 with Dr. Harrington Challenger.

## 2017-02-20 NOTE — Progress Notes (Signed)
Cardiology Office Note   Date:  02/20/2017   ID:  Alicia [name], DOB [birth year], MRN [id number]  PCP:  Burnis Medin, MD  Cardiologist:   Dorris Carnes, MD      History of Present Illness: Alicia [name] is a 62 y.o. female with a history of CP(atypical), palpitation I saw her in 2018  Echo LVEF normal  Mild diastolic dysfunction Seen in ED on 01/02/17 for flu like symtoms  K ws low   She was seen by Kathleen Argue in Jan   Denied CP  Had some congestion (URI)   Denies CP with activity  EKG abnormal   Recomm exercise myovue   The pt did not have this done   SInce seen she said she is finally feeling back to normal from the flu  Feels hydration is back to normal.     She is active  Denies CP  No palpitations  Breathing is OK    Current Outpatient Medications  Medication Sig Dispense Refill  . acetaminophen (TYLENOL) 325 MG tablet Take 650 mg by mouth every 6 (six) hours as needed for mild pain.    . chlorthalidone (HYGROTON) 25 MG tablet Take 1 tablet (25 mg total) by mouth daily. 30 tablet 11  . Multiple Vitamin (MULTIVITAMIN WITH MINERALS) TABS tablet Take 1 tablet by mouth daily. Women's formula    . potassium chloride SA (K-DUR,KLOR-CON) 20 MEQ tablet Take 1 tablet (20 mEq total) by mouth as directed. Take 2 tabs in the AM.: take 1 tab in the PM 135 tablet 3   No current facility-administered medications for this visit.     Allergies:   Contrast media [iodinated diagnostic agents]; Iohexol; Morphine; Prednisone; Valsartan; Lidocaine hcl; Shellfish allergy; Sulfa antibiotics; Ciprofloxacin; Penicillins; Sulfamethoxazole-trimethoprim; and Sulfonamide derivatives   Past Medical History:  Diagnosis Date  . Allergy   . Chronic tension headaches   . Hx of varicella   . Hypertension   . Kidney stones   . Near syncope   . Nephrolithiasis    history of  passed on own.   . Palpitations   . Thyroid disease   . Varicose veins     Past Surgical History:  Procedure Laterality  Date  . ABDOMINAL HYSTERECTOMY     fibroids non cancer   . CATARACT EXTRACTION     Bilateral  . cataract surgery Right 03-02-2012  . CHOLECYSTECTOMY    . TONSILLECTOMY       Social History:  The patient  reports that  has never smoked. she has never used smokeless tobacco. She reports that she does not drink alcohol or use drugs.   Family History:  The patient's family history includes Cancer in her maternal aunt, mother, and sister; Diabetes in her brother, mother, and sister; Heart disease in her father, maternal grandfather, paternal grandfather, and paternal grandmother; Liver disease in her mother; Stroke in her father.    ROS:  Please see the history of present illness. All other systems are reviewed and  Negative to the above problem except as noted.    PHYSICAL EXAM: VS:  BP 130/80   Pulse 67   Ht 5\' 6"  (1.676 m)   Wt 255 lb 1.9 oz (115.7 kg)   SpO2 97%   BMI 41.18 kg/m   GEN:  Morbidly obese 62 yo  in no acute distress  HEENT: normal  Neck: no JVD, carotid bruits, or masses Cardiac: RRR; Gr I/VI systolic murmur  No  , rubs,  or gallops,  Tr edema  Respiratory:  clear to auscultation bilaterally, normal work of breathing GI: soft, nontender, nondistended, + BS  No hepatomegaly  MS: no deformity Moving all extremities   Skin: warm and dry, no rash Neuro:  Strength and sensation are intact Psych: euthymic mood, full affect   EKG:  EKG is not  ordered today.     Lipid Panel    Component Value Date/Time   CHOL 190 07/17/2015 1500   TRIG 139.0 07/17/2015 1500   HDL 64.80 07/17/2015 1500   CHOLHDL 3 07/17/2015 1500   VLDL 27.8 07/17/2015 1500   LDLCALC 98 07/17/2015 1500      Wt Readings from Last 3 Encounters:  02/20/17 255 lb 1.9 oz (115.7 kg)  01/23/17 253 lb 1.9 oz (114.8 kg)  01/06/17 250 lb 6.4 oz (113.6 kg)      ASSESSMENT AND PLAN:  1  CP  Denies     2.  HTN  BP is good   Her K has been low but she is recovering from a viral illness   Will  check BMET today  Determine what to do with meds (chlorthalidone, K supplemnets)  KDur has caused some GI irritation at night   May be able to cut back    Wll set f/u in June      Current medicines are reviewed at length with the patient today.  The patient does not have concerns regarding medicines.  F/U with me as needed if symptoms change   Signed, Dorris Carnes, MD  02/20/2017 4:29 PM    [address], [place], [place]  [address] Phone: [phone]; Fax: [phone]

## 2017-02-21 LAB — BASIC METABOLIC PANEL
BUN/Creatinine Ratio: 21 (ref 12–28)
BUN: 14 mg/dL (ref 8–27)
CO2: 28 mmol/L (ref 20–29)
Calcium: 9.3 mg/dL (ref 8.7–10.3)
Chloride: 100 mmol/L (ref 96–106)
Creatinine, Ser: 0.68 mg/dL (ref 0.57–1.00)
GFR calc Af Amer: 110 mL/min/{1.73_m2} (ref >59)
GFR calc non Af Amer: 95 mL/min/{1.73_m2} (ref >59)
Glucose: 89 mg/dL (ref 65–99)
Potassium: 3.7 mmol/L (ref 3.5–5.2)
Sodium: 142 mmol/L (ref 134–144)

## 2017-02-21 LAB — MAGNESIUM: Magnesium: 2.2 mg/dL (ref 1.6–2.3)

## 2017-02-22 ENCOUNTER — Telehealth: Payer: Self-pay | Admitting: Internal Medicine

## 2017-02-22 DIAGNOSIS — E876 Hypokalemia: Secondary | ICD-10-CM

## 2017-02-22 DIAGNOSIS — I10 Essential (primary) hypertension: Secondary | ICD-10-CM

## 2017-02-22 MED ORDER — POTASSIUM CHLORIDE CRYS ER 20 MEQ PO TBCR: 40.0000 meq | EXTENDED_RELEASE_TABLET | ORAL | 3 refills | Status: DC

## 2017-02-22 NOTE — Telephone Encounter (Signed)
New message  PT is returning a call from today in regards to labs.

## 2017-02-22 NOTE — Telephone Encounter (Signed)
Notes recorded by Rodman Key, RN on 02/22/2017 at 11:54 AM EST Results released to MyChart patient portal by MD. Called patient to inform of changes/recommendations. Left message for her to call back. ------  Notes recorded by Fay Records, MD on 02/21/2017 at 10:04 PM EST Potassium is OK Magnesium is OK Can cut back to potassium in AM only Will need BMET in 10 days   Spoke with the pt and informed her that per Dr Harrington Challenger, her K is OK, magnesium is OK, and Dr Harrington Challenger recommends that she cut her K-dur back to AM ONLY (40 mEq total), and recheck a BMET in 10 days.  Made the change in pts med list.  Lab appt scheduled for 03/03/17, to recheck a BMET.  Pt verbalized understanding and agrees with this plan.

## 2017-03-03 ENCOUNTER — Other Ambulatory Visit: Payer: Federal, State, Local not specified - PPO

## 2017-03-10 ENCOUNTER — Other Ambulatory Visit: Payer: Federal, State, Local not specified - PPO | Admitting: *Deleted

## 2017-03-10 DIAGNOSIS — E876 Hypokalemia: Secondary | ICD-10-CM | POA: Diagnosis not present

## 2017-03-10 DIAGNOSIS — I10 Essential (primary) hypertension: Secondary | ICD-10-CM | POA: Diagnosis not present

## 2017-03-11 LAB — BASIC METABOLIC PANEL
BUN/Creatinine Ratio: 27 (ref 12–28)
BUN: 20 mg/dL (ref 8–27)
CO2: 27 mmol/L (ref 20–29)
Calcium: 9.2 mg/dL (ref 8.7–10.3)
Chloride: 103 mmol/L (ref 96–106)
Creatinine, Ser: 0.73 mg/dL (ref 0.57–1.00)
GFR calc Af Amer: 104 mL/min/{1.73_m2} (ref >59)
GFR calc non Af Amer: 90 mL/min/{1.73_m2} (ref >59)
Glucose: 92 mg/dL (ref 65–99)
Potassium: 3.5 mmol/L (ref 3.5–5.2)
Sodium: 146 mmol/L — ABNORMAL HIGH (ref 134–144)

## 2017-03-13 ENCOUNTER — Encounter: Payer: Self-pay | Admitting: *Deleted

## 2017-03-13 ENCOUNTER — Other Ambulatory Visit: Payer: Self-pay | Admitting: *Deleted

## 2017-03-13 DIAGNOSIS — I10 Essential (primary) hypertension: Secondary | ICD-10-CM

## 2017-04-06 DIAGNOSIS — S90122A Contusion of left lesser toe(s) without damage to nail, initial encounter: Secondary | ICD-10-CM | POA: Diagnosis not present

## 2017-04-10 ENCOUNTER — Other Ambulatory Visit: Payer: Federal, State, Local not specified - PPO | Admitting: *Deleted

## 2017-04-10 DIAGNOSIS — I10 Essential (primary) hypertension: Secondary | ICD-10-CM | POA: Diagnosis not present

## 2017-04-11 LAB — BASIC METABOLIC PANEL
BUN/Creatinine Ratio: 28 (ref 12–28)
BUN: 22 mg/dL (ref 8–27)
CO2: 26 mmol/L (ref 20–29)
Calcium: 9.4 mg/dL (ref 8.7–10.3)
Chloride: 103 mmol/L (ref 96–106)
Creatinine, Ser: 0.78 mg/dL (ref 0.57–1.00)
GFR calc Af Amer: 95 mL/min/{1.73_m2} (ref >59)
GFR calc non Af Amer: 82 mL/min/{1.73_m2} (ref >59)
Glucose: 79 mg/dL (ref 65–99)
Potassium: 4 mmol/L (ref 3.5–5.2)
Sodium: 144 mmol/L (ref 134–144)

## 2017-04-21 DIAGNOSIS — L293 Anogenital pruritus, unspecified: Secondary | ICD-10-CM | POA: Diagnosis not present

## 2017-05-11 DIAGNOSIS — Z8041 Family history of malignant neoplasm of ovary: Secondary | ICD-10-CM | POA: Diagnosis not present

## 2017-05-23 DIAGNOSIS — Z8041 Family history of malignant neoplasm of ovary: Secondary | ICD-10-CM | POA: Diagnosis not present

## 2017-05-23 DIAGNOSIS — N83201 Unspecified ovarian cyst, right side: Secondary | ICD-10-CM | POA: Diagnosis not present

## 2017-05-31 DIAGNOSIS — N83201 Unspecified ovarian cyst, right side: Secondary | ICD-10-CM | POA: Diagnosis not present

## 2017-05-31 DIAGNOSIS — Z8041 Family history of malignant neoplasm of ovary: Secondary | ICD-10-CM | POA: Diagnosis not present

## 2017-06-19 ENCOUNTER — Encounter: Payer: Self-pay | Admitting: Internal Medicine

## 2017-06-19 ENCOUNTER — Ambulatory Visit: Payer: Federal, State, Local not specified - PPO | Admitting: Internal Medicine

## 2017-06-19 VITALS — BP 137/96 | HR 57 | Ht 66.0 in | Wt 256.8 lb

## 2017-06-19 DIAGNOSIS — R072 Precordial pain: Secondary | ICD-10-CM

## 2017-06-19 DIAGNOSIS — I10 Essential (primary) hypertension: Secondary | ICD-10-CM

## 2017-06-19 NOTE — Progress Notes (Signed)
Cardiology Office Note   Date:  06/19/2017   ID:  Alicia [name], DOB Jan 19, 1955, MRN [id number]  PCP:  Burnis Medin, MD  Cardiologist:   Dorris Carnes, MD   Pt presents for f/u of CP and HTN   History of Present Illness: Alicia [name] is a 62 y.o. 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 acetaminophen (TYLENOL) 325 MG tablet Take 650 mg by mouth every 6 (six) hours as needed for mild pain.    . chlorthalidone (HYGROTON) 25 MG tablet Take 1 tablet (25 mg total) by mouth daily. 30 tablet 11  . Multiple Vitamin (MULTIVITAMIN WITH MINERALS) TABS tablet Take 1 tablet by mouth daily. Women's formula    . potassium chloride SA (K-DUR,KLOR-CON) 20 MEQ tablet Take 2 tablets (40 mEq total) by mouth as directed. Take 2 tabs in the AM. 60 tablet 3   No current facility-administered medications for this visit.     Allergies:   Ciprofloxacin hcl; Iodinated diagnostic agents; Iohexol; Morphine; Sulfa antibiotics; Prednisone; Valsartan; Lidocaine hcl; Shellfish allergy; Ciprofloxacin; Penicillins; Sulfamethoxazole-trimethoprim; and Sulfonamide derivatives   Past Medical History:  Diagnosis Date  . Allergy   . Chronic tension headaches   . Hx of varicella   . Hypertension   . Kidney stones   . Near syncope   . Nephrolithiasis    history of  passed on own.   . Palpitations   . Thyroid disease   . Varicose veins     Past Surgical History:  Procedure Laterality Date  . ABDOMINAL  HYSTERECTOMY     fibroids non cancer   . CATARACT EXTRACTION     Bilateral  . cataract surgery Right 03-02-2012  . CHOLECYSTECTOMY    . TONSILLECTOMY       Social History:  The patient  reports that she has never smoked. She has never used smokeless tobacco. She reports that she does not drink alcohol or use drugs.   Family History:  The patient's family history includes Cancer in her maternal aunt, mother, and sister; Diabetes in her brother, mother, and sister; Heart disease in her father, maternal grandfather, paternal grandfather, and paternal grandmother; Liver disease in her mother; Stroke in her father.    ROS:  Please see the history of present illness. All other systems are reviewed and  Negative to the above problem except as noted.    PHYSICAL EXAM: VS:  BP (!) 137/96   Pulse (!) 57   Ht 5\' 6"  (1.676 m)   Wt 116.5 kg (256 lb 12.8 oz)   BMI 41.45 kg/m   GEN:  Morbidly obese 62 yo  in no acute distress  HEENT: normal  Neck:  JVP is normal  No  carotid bruits, or masses Cardiac: RRR; Gr I/VI systolic murmur  No  , rubs, or gallops,  Tr edema  Respiratory:  clear to auscultation bilaterally, normal work of breathing GI: soft, nontender, nondistended, + BS  No hepatomegaly  MS: no deformity Moving all extremities   Skin: warm and dry, no rash Neuro:  Strength and sensation are intact Psych: euthymic mood, full affect   EKG:  EKG is not  ordered today.     Lipid Panel    Component Value Date/Time   CHOL 190 07/17/2015 1500   TRIG 139.0 07/17/2015 1500   HDL 64.80 07/17/2015 1500   CHOLHDL 3 07/17/2015 1500   VLDL 27.8 07/17/2015 1500   LDLCALC 98 07/17/2015 1500      Wt Readings from Last 3 Encounters:  06/19/17 116.5 kg (256 lb 12.8 oz)  02/20/17 115.7 kg (255 lb 1.9 oz)  01/23/17 114.8 kg (253 lb 1.9 oz)      ASSESSMENT AND PLAN:  1  CP  Denies symptoms   Follow   2.  HTN  BP is better at home   It is lower on my check Keep on same meds   3   HCM   Will check lipids   Check BMET today   Wll set f/u in December / Jan   Current medicines are reviewed at length with the patient today.  The patient does not have concerns regarding medicines.  F/U with me as needed if symptoms change   Signed, Dorris Carnes, MD  06/19/2017 4:24 PM    [address], [place], [place]  [address] Phone: [phone]; Fax: [phone]

## 2017-06-19 NOTE — Patient Instructions (Addendum)
Your physician recommends that you continue on your current medications as directed. Please refer to the Current Medication list given to you today.  Your physician recommends that you return for lab work today (LIPIDS/BMET)  Your physician wants you to follow-up in: Dec/Jan with Dr. Harrington Challenger.  You will receive a reminder letter in the mail two months in advance. If you don't receive a letter, please call our office to schedule the follow-up appointment.

## 2017-06-20 LAB — BASIC METABOLIC PANEL
BUN/Creatinine Ratio: 28 (ref 12–28)
BUN: 19 mg/dL (ref 8–27)
CO2: 24 mmol/L (ref 20–29)
Calcium: 9.4 mg/dL (ref 8.7–10.3)
Chloride: 101 mmol/L (ref 96–106)
Creatinine, Ser: 0.67 mg/dL (ref 0.57–1.00)
GFR calc Af Amer: 110 mL/min/{1.73_m2} (ref >59)
GFR calc non Af Amer: 95 mL/min/{1.73_m2} (ref >59)
Glucose: 66 mg/dL (ref 65–99)
Potassium: 3.5 mmol/L (ref 3.5–5.2)
Sodium: 144 mmol/L (ref 134–144)

## 2017-06-20 LAB — LIPID PANEL
Chol/HDL Ratio: 3.2 ratio (ref 0.0–4.4)
Cholesterol, Total: 205 mg/dL — ABNORMAL HIGH (ref 100–199)
HDL: 65 mg/dL (ref >39)
LDL Calculated: 115 mg/dL — ABNORMAL HIGH (ref 0–99)
Triglycerides: 125 mg/dL (ref 0–149)
VLDL Cholesterol Cal: 25 mg/dL (ref 5–40)

## 2017-06-23 ENCOUNTER — Telehealth: Payer: Self-pay | Admitting: Internal Medicine

## 2017-06-23 NOTE — Telephone Encounter (Signed)
Pt aware of lab results ./cy 

## 2017-06-23 NOTE — Telephone Encounter (Signed)
Pt returning call regarding lab results-pls call [phone] until 145pm

## 2017-06-29 DIAGNOSIS — N83201 Unspecified ovarian cyst, right side: Secondary | ICD-10-CM | POA: Diagnosis not present

## 2017-06-29 DIAGNOSIS — Z6841 Body Mass Index (BMI) 40.0 and over, adult: Secondary | ICD-10-CM | POA: Diagnosis not present

## 2017-06-29 DIAGNOSIS — I10 Essential (primary) hypertension: Secondary | ICD-10-CM | POA: Diagnosis not present

## 2017-06-29 DIAGNOSIS — Z8041 Family history of malignant neoplasm of ovary: Secondary | ICD-10-CM | POA: Diagnosis not present

## 2017-07-25 ENCOUNTER — Ambulatory Visit: Payer: Self-pay | Admitting: *Deleted

## 2017-07-25 NOTE — Telephone Encounter (Signed)
Pt had questions regarding a cough from bronchitis. She was treated for bronchitis while on vacation with an antibiotic and cough med. finish up the antibiotic 4 days ago but still have the cough . But now she thinks it is more productive as before it was not. Denies fever. Has Delsym to take at night. Home care advice given with understanding. Try honey at night, also hot tea with honey and lemon, increase fluids and use humidifier.  Will call back if fever develops or dark sputum with chest pain. Will route to flow at [facility] at [place].  Reason for Disposition . General information question, no triage required and triager able to answer question  Answer Assessment - Initial Assessment Questions 1. REASON FOR CALL or QUESTION: "What is your reason for calling today?" or "How can I best help you?" or "What question do you have that I can help answer?"     Suggestions for a cough, following being dx with bronchitis.  Protocols used: INFORMATION ONLY CALL-A-AH

## 2017-07-29 ENCOUNTER — Encounter: Payer: Self-pay | Admitting: Family Medicine

## 2017-07-29 ENCOUNTER — Ambulatory Visit: Payer: Federal, State, Local not specified - PPO | Admitting: Family Medicine

## 2017-07-29 VITALS — BP 120/82 | HR 58 | Temp 98.2°F | Wt 255.8 lb

## 2017-07-29 DIAGNOSIS — J40 Bronchitis, not specified as acute or chronic: Secondary | ICD-10-CM

## 2017-07-29 MED ORDER — METHYLPREDNISOLONE ACETATE 80 MG/ML IJ SUSP
80.0000 mg | Freq: Once | INTRAMUSCULAR | Status: AC
Start: 2017-07-29 — End: 2017-07-29
  Administered 2017-07-29: 80 mg via INTRAMUSCULAR

## 2017-07-29 MED ORDER — MOMETASONE FURO-FORMOTEROL FUM 100-5 MCG/ACT IN AERO: INHALATION_SPRAY | RESPIRATORY_TRACT | 0 refills | Status: DC

## 2017-07-29 MED ORDER — DOXYCYCLINE HYCLATE 100 MG PO TABS: 100.0000 mg | ORAL_TABLET | Freq: Two times a day (BID) | ORAL | 0 refills | Status: DC

## 2017-07-29 NOTE — Patient Instructions (Signed)

## 2017-07-29 NOTE — Progress Notes (Signed)
Patient ID: Alicia [name], female    DOB: 1955-06-20  Age: 62 y.o. MRN: [id number]    Subjective:  Subjective  HPI Alicia [name] presents for f/u bronchitis.  She [place] and given erythromycin -----she is only slightly better.  Still coughing mostly at night and using her albuterol   Review of Systems  Constitutional: Positive for fatigue. Negative for activity change, appetite change and unexpected weight change.  Respiratory: Positive for cough and wheezing. Negative for shortness of breath.   Cardiovascular: Negative for chest pain and palpitations.  Psychiatric/Behavioral: Negative for behavioral problems and dysphoric mood. The patient is not nervous/anxious.     History Past Medical History:  Diagnosis Date  . Allergy   . Chronic tension headaches   . Hx of varicella   . Hypertension   . Kidney stones   . Near syncope   . Nephrolithiasis    history of  passed on own.   . Palpitations   . Thyroid disease   . Varicose veins     She has a past surgical history that includes Abdominal hysterectomy; Cholecystectomy; Tonsillectomy; cataract surgery (Right, 03-02-2012); and Cataract extraction.   Her family history includes Cancer in her maternal aunt, mother, and sister; Diabetes in her brother, mother, and sister; Heart disease in her father, maternal grandfather, paternal grandfather, and paternal grandmother; Liver disease in her mother; Stroke in her father.She reports that she has never smoked. She has never used smokeless tobacco. She reports that she does not drink alcohol or use drugs.  Current Outpatient Medications on File Prior to Visit  Medication Sig Dispense Refill  . acetaminophen (TYLENOL) 325 MG tablet Take 650 mg by mouth every 6 (six) hours as needed for mild pain.    Marland Kitchen albuterol (PROVENTIL HFA;VENTOLIN HFA) 108 (90 Base) MCG/ACT inhaler INHALE 2 PUFFS EVERY 4 TO 6 HOURS AS NEEDED FOR CHEST TIGHTNESS/SHORTNESS OF  BREATH/WHEEZING  0  . chlorthalidone (HYGROTON) 25 MG tablet Take 1 tablet (25 mg total) by mouth daily. 30 tablet 11  . Multiple Vitamin (MULTIVITAMIN WITH MINERALS) TABS tablet Take 1 tablet by mouth daily. Women's formula    . potassium chloride SA (K-DUR,KLOR-CON) 20 MEQ tablet Take 2 tablets (40 mEq total) by mouth as directed. Take 2 tabs in the AM. 60 tablet 3   No current facility-administered medications on file prior to visit.      Objective:  Objective  Physical Exam  Constitutional: She is oriented to person, place, and time. She appears well-developed and well-nourished.  HENT:  Right Ear: External ear normal.  Left Ear: External ear normal.  + PND + errythema  Eyes: Conjunctivae are normal. Right eye exhibits no discharge. Left eye exhibits no discharge.  Cardiovascular: Normal rate, regular rhythm and normal heart sounds.  No murmur heard. Pulmonary/Chest: Effort normal. No respiratory distress. She has wheezes. She has no rales. She exhibits no tenderness.  Musculoskeletal: She exhibits no edema.  Lymphadenopathy:    She has cervical adenopathy.  Neurological: She is alert and oriented to person, place, and time.  Nursing note and vitals reviewed.  BP 120/82   Pulse (!) 58   Temp 98.2 F (36.8 C) (Oral)   Wt 255 lb 12.8 oz (116 kg)   SpO2 99%   BMI 41.29 kg/m  Wt Readings from Last 3 Encounters:  07/29/17 255 lb 12.8 oz (116 kg)  06/19/17 256 lb 12.8 oz (116.5 kg)  02/20/17 255 lb 1.9  oz (115.7 kg)     Lab Results  Component Value Date   WBC 7.9 01/02/2017   HGB 14.2 01/02/2017   HCT 41.6 01/02/2017   PLT 171 01/02/2017   GLUCOSE 66 06/19/2017   CHOL 205 (H) 06/19/2017   TRIG 125 06/19/2017   HDL 65 06/19/2017   LDLCALC 115 (H) 06/19/2017   ALT 25 01/02/2017   AST 34 01/02/2017   NA 144 06/19/2017   K 3.5 06/19/2017   CL 101 06/19/2017   CREATININE 0.67 06/19/2017   BUN 19 06/19/2017   CO2 24 06/19/2017   TSH 2.92 07/17/2015   INR 1.0  ratio 04/03/2009   HGBA1C 5.5 07/17/2015    Dg Chest 2 View  Result Date: 01/02/2017 CLINICAL DATA:  Cough, chest congestion, and shortness of breath for the past 3 days. Abnormal EKG today. History of asthma, hypertension, never smoked. EXAM: CHEST  2 VIEW COMPARISON:  PA and lateral chest x-ray of July 12, 2016 FINDINGS: The lungs are adequately inflated. There is linear subsegmental atelectasis just above the left hemidiaphragm laterally. The heart and pulmonary vascularity are normal. The mediastinum is normal in width. The trachea is midline. There is no pleural effusion IMPRESSION: Subsegmental atelectasis at the left lung base laterally. No pneumonia, CHF, nor other acute cardiopulmonary abnormality. Electronically Signed   By: David  Martinique M.D.   On: 01/02/2017 13:28     Assessment & Plan:  Plan  I am having Alicia [name] start on doxycycline and mometasone-formoterol. I am also having her maintain her multivitamin with minerals, chlorthalidone, acetaminophen, potassium chloride SA, and albuterol. We administered methylPREDNISolone acetate.  Meds ordered this encounter  Medications  . doxycycline (VIBRA-TABS) 100 MG tablet    Sig: Take 1 tablet (100 mg total) by mouth 2 (two) times daily.    Dispense:  20 tablet    Refill:  0  . mometasone-formoterol (DULERA) 100-5 MCG/ACT AERO    Sig: 2 puffs bid    Dispense:  1 Inhaler    Refill:  0  . methylPREDNISolone acetate (DEPO-MEDROL) injection 80 mg    Problem List Items Addressed This Visit    None    Visit Diagnoses    Bronchitis    -  Primary   Relevant Medications   doxycycline (VIBRA-TABS) 100 MG tablet   mometasone-formoterol (DULERA) 100-5 MCG/ACT AERO   methylPREDNISolone acetate (DEPO-MEDROL) injection 80 mg (Completed)    con't albuterol-- f/u pcp next week Pt allergic to prednisone tab but has had depo medrol in the past Im injection given  dulera sample lot [id number]  Ex 12/24/17 Follow-up: No follow-ups on  file.  Ann Held, DO

## 2017-07-31 ENCOUNTER — Ambulatory Visit: Payer: Self-pay | Admitting: *Deleted

## 2017-07-31 NOTE — Telephone Encounter (Signed)
[facility], Dx with bronchitis. Received Depo-Medrol injection and placed on doxycycline. Also using Dulera inhaler.  States this am coughed up "Few streaks of light pinkish-brown strands" after coughing spell. One episode only, scant amount. States SOB better and cough has lessened but more productive, greenish. Afebrile. Taking doxycycline as ordered.  Home care advise given per protocol. Instructed to call back if hemoptysis continues, increases, symptoms worsen. Reason for Disposition . Few streaks of blood mixed in with yellow or green sputum (all other triage questions negative)  Answer Assessment - Initial Assessment Questions 1. ONSET: "When did you start coughing up blood?"    This am 2. SEVERITY: "How many times?" "How much blood?" (e.g., flecks, streaks, tablespoons, etc)     One time, few flecks 3. COUGHING SPASMS: "Did the blood appear after a coughing spell?"       yes 4. RESPIRATORY DISTRESS: "Describe your breathing."      SOB a little better since Saturday, seen at clinic. 5. FEVER: "Do you have a fever?" If so, ask: "What is your temperature, how was it measured, and when did it start?"     no 6. SPUTUM: "Describe the color of your sputum" (clear, white, yellow, green), "Has there been any change recently?"     greenish 7. CARDIAC HISTORY: "Do you have any history of heart disease?" (e.g., heart attack, congestive heart failure)     HTN 8. LUNG HISTORY: "Do you have any history of lung disease?"  (e.g., pulmonary embolus, asthma, emphysema)     no 9. PE RISK FACTORS: "Do you have a history of blood clots?" (or: recent major surgery, recent prolonged travel, bedridden)     no 10. OTHER SYMPTOMS: "Do you have any other symptoms?" (e.g., nosebleed, chest pain, abdominal pain, vomiting) no  Protocols used: COUGHING UP BLOOD-A-AH

## 2017-08-01 ENCOUNTER — Ambulatory Visit (INDEPENDENT_AMBULATORY_CARE_PROVIDER_SITE_OTHER): Payer: Federal, State, Local not specified - PPO

## 2017-08-01 ENCOUNTER — Ambulatory Visit (HOSPITAL_COMMUNITY)
Admission: EM | Admit: 2017-08-01 | Discharge: 2017-08-01 | Disposition: A | Discharge status: Home / Self Care | Payer: Federal, State, Local not specified - PPO | Attending: Family Medicine | Admitting: Family Medicine

## 2017-08-01 ENCOUNTER — Other Ambulatory Visit: Payer: Self-pay

## 2017-08-01 ENCOUNTER — Encounter (HOSPITAL_COMMUNITY): Payer: Self-pay | Admitting: Emergency Medicine

## 2017-08-01 DIAGNOSIS — J189 Pneumonia, unspecified organism: Secondary | ICD-10-CM

## 2017-08-01 DIAGNOSIS — J40 Bronchitis, not specified as acute or chronic: Secondary | ICD-10-CM

## 2017-08-01 DIAGNOSIS — R05 Cough: Secondary | ICD-10-CM | POA: Diagnosis not present

## 2017-08-01 DIAGNOSIS — J181 Lobar pneumonia, unspecified organism: Secondary | ICD-10-CM | POA: Diagnosis not present

## 2017-08-01 LAB — POCT I-STAT, CHEM 8
BUN: 14 mg/dL (ref 8–23)
Calcium, Ion: 1.08 mmol/L — ABNORMAL LOW (ref 1.15–1.40)
Chloride: 102 mmol/L (ref 98–111)
Creatinine, Ser: 0.7 mg/dL (ref 0.44–1.00)
Glucose, Bld: 83 mg/dL (ref 70–99)
HCT: 43 % (ref 36.0–46.0)
Hemoglobin: 14.6 g/dL (ref 12.0–15.0)
Potassium: 3.8 mmol/L (ref 3.5–5.1)
Sodium: 140 mmol/L (ref 135–145)
TCO2: 25 mmol/L (ref 22–32)

## 2017-08-01 MED ORDER — IPRATROPIUM-ALBUTEROL 0.5-2.5 (3) MG/3ML IN SOLN
3.0000 mL | Freq: Once | RESPIRATORY_TRACT | Status: AC
  Administered 2017-08-01: 3 mL via RESPIRATORY_TRACT

## 2017-08-01 MED ORDER — IPRATROPIUM-ALBUTEROL 0.5-2.5 (3) MG/3ML IN SOLN
RESPIRATORY_TRACT | Status: AC
  Filled 2017-08-01: qty 3

## 2017-08-01 MED ORDER — PREDNISONE 10 MG (21) PO TBPK: ORAL_TABLET | Freq: Every day | ORAL | 0 refills | Status: DC

## 2017-08-01 MED ORDER — GUAIFENESIN 100 MG/5ML PO LIQD: 100.0000 mg | ORAL | 0 refills | Status: DC | PRN

## 2017-08-01 NOTE — Discharge Instructions (Addendum)
It was nice meeting you!!  Your chest x-ray showed pneumonia and bronchitis. Your potassium was normal.  Continue taking the doxycycline for the full 10 days to treat the pneumonia. I am prescribing you prednisone taper for inflammation in your lungs.  Please start the prednisone as soon as you can. Use the albuterol inhaler every 6 hours as needed for wheezing, shortness of breath.  I am also giving you medication for cough and congestion. If you develop any severe symptoms to include extreme shortness of breath, chest pain, dizziness please go to the ER.

## 2017-08-01 NOTE — ED Provider Notes (Signed)
[place]    CSN: [id number] Arrival date & time: 08/01/17  1442     History   Chief Complaint Chief Complaint  Patient presents with  . Cough    HPI Kadasia Kassing is a 62 y.o. female.   Patient is a 62 year old female with significant past medical history and allergy list.  She presents today with approximately 2 weeks of cough, shortness of breath.  This all started while she was on vacation in [place] on 25 June.  At that time she was seen at an urgent care in [place]  and treated for bronchitis with an antibiotic which she reports helped her fever but not her cough.  She then returned home and was  seen by her physician on 07/29/17 and given a steroid injection, doxycycline and an albuterol inhaler.   She reports an episode of coughing where she almost passed out today.  She was concerned today because she had some hemoptysis,  increased fatigue, shortness of breath and some anxiety about the whole situation.   She denies any chest pain, except for with coughing, dizziness, syncope, leg pain or leg swelling.   No medical or family hx of PE or DVT. She does not smoke.        Past Medical History:  Diagnosis Date  . Allergy   . Chronic tension headaches   . Hx of varicella   . Hypertension   . Kidney stones   . Near syncope   . Nephrolithiasis    history of  passed on own.   . Palpitations   . Thyroid disease   . Varicose veins     Patient Active Problem List   Diagnosis Date Noted  . Hypokalemia 01/25/2017  . Thyroid disease   . Palpitations   . Nephrolithiasis   . Near syncope   . Kidney stones   . Hypertension   . Hx of varicella   . Chronic tension headaches   . Allergy   . Nocturnal headaches 05/04/2016  . Coccygodynia 01/29/2015  . Atypical chest pain 09/09/2014  . Essential hypertension 09/09/2014  . Midline low back pain without sciatica 09/05/2013  . Renal cyst 09/05/2013  . Fatty infiltration of liver 09/05/2013  . Umbilical  hernia without obstruction and without gangrene 09/05/2013  . Flank pain 07/29/2013  . Encounter to establish care with new doctor 07/11/2013  . Left leg pain 07/11/2013  . Cough 01/22/2013  . Swelling in head/neck 10/31/2012  . Routine health maintenance 04/05/2012  . Unspecified vitamin D deficiency 04/04/2012  . Varicose veins of lower extremities with other complications 39/76/7341  . Chronic venous insufficiency 09/02/2011  . Allergic rhinitis, cause unspecified 06/08/2011  . Paresthesia of left arm 06/08/2011  . Essential hypertension, benign 03/26/2010  . OBESITY, CLASS III 10/02/2009  . Other and unspecified coagulation defects 04/03/2009  . PERIPHERAL EDEMA 10/22/2008  . HEMORRHOIDS, INTERNAL, WITH BLEEDING 05/07/2008  . NEPHROLITHIASIS, HX OF 04/28/2008  . SYNCOPE 04/19/2007  . ANXIETY DEPRESSION 10/31/2006  . HYSTERECTOMY, VAGINAL, HX OF 10/31/2006    Past Surgical History:  Procedure Laterality Date  . ABDOMINAL HYSTERECTOMY     fibroids non cancer   . CATARACT EXTRACTION     Bilateral  . cataract surgery Right 03-02-2012  . CHOLECYSTECTOMY    . TONSILLECTOMY      OB History    Gravida  3   Para  3   Term  3   Preterm      AB  Living        SAB      TAB      Ectopic      Multiple      Live Births               Home Medications    Prior to Admission medications   Medication Sig Start Date End Date Taking? Authorizing Provider  albuterol (PROVENTIL HFA;VENTOLIN HFA) 108 (90 Base) MCG/ACT inhaler INHALE 2 PUFFS EVERY 4 TO 6 HOURS AS NEEDED FOR CHEST TIGHTNESS/SHORTNESS OF BREATH/WHEEZING 07/11/17  Yes [provider]  chlorthalidone (HYGROTON) 25 MG tablet Take 1 tablet (25 mg total) by mouth daily. 10/26/16  Yes Supple, Megan E, RPH  doxycycline (VIBRA-TABS) 100 MG tablet Take 1 tablet (100 mg total) by mouth 2 (two) times daily. 07/29/17  Yes Carollee Herter, Kendrick Fries R, DO  mometasone-formoterol (DULERA) 100-5 MCG/ACT AERO 2  puffs bid 07/29/17  Yes Ann Held, DO  Multiple Vitamin (MULTIVITAMIN WITH MINERALS) TABS tablet Take 1 tablet by mouth daily. Women's formula   Yes [provider]  potassium chloride SA (K-DUR,KLOR-CON) 20 MEQ tablet Take 2 tablets (40 mEq total) by mouth as directed. Take 2 tabs in the AM. 02/22/17 08/01/17 Yes Fay Records, MD  acetaminophen (TYLENOL) 325 MG tablet Take 650 mg by mouth every 6 (six) hours as needed for mild pain.    [provider]  benzonatate (TESSALON) 100 MG capsule TAKE ONE CAPSULE BY MOUTH 3 TIMES A DAY AS NEEDED FOR COUGH 07/11/17   [provider]  erythromycin base (E-MYCIN) 500 MG tablet Take 500 mg by mouth every 12 (twelve) hours. 07/11/17   [provider]  guaiFENesin (ROBITUSSIN) 100 MG/5ML liquid Take 5-10 mLs (100-200 mg total) by mouth every 4 (four) hours as needed for cough. 08/01/17   Sabrena Gavitt, Tressia Miners A, NP  predniSONE (STERAPRED UNI-PAK 21 TAB) 10 MG (21) TBPK tablet Take by mouth daily. Take 6 tabs by mouth daily  for 1 day, then 5 tabs for 1days, then 4 tabs for 1days, then 3 tabs for 1days, 2 tabs for 1days, then 1 tab by mouth daily for 1days 08/01/17   Orvan July, NP    Family History Family History  Problem Relation Age of Onset  . Cancer Mother        CERVICAL uterin cancer   . Liver disease Mother        from chronic heaptitis   . Diabetes Mother   . Heart disease Father        avdisease avr  . Stroke Father   . Cancer Sister        Ovarian and endometrial  . Cancer Maternal Aunt        Colon  . Heart disease Maternal Grandfather   . Heart disease Paternal Grandmother   . Heart disease Paternal Grandfather   . Diabetes Sister   . Diabetes Brother     Social History Social History   Tobacco Use  . Smoking status: Never Smoker  . Smokeless tobacco: Never Used  Substance Use Topics  . Alcohol use: No  . Drug use: No     Allergies   Ciprofloxacin hcl; Iodinated diagnostic agents;  Iohexol; Morphine; Sulfa antibiotics; Prednisone; Valsartan; Lidocaine hcl; Shellfish allergy; Ciprofloxacin; Penicillins; Sulfamethoxazole-trimethoprim; and Sulfonamide derivatives   Review of Systems Review of Systems   Physical Exam Triage Vital Signs ED Triage Vitals  Enc Vitals Group     BP 08/01/17 1455 Marland Kitchen)  152/102     Pulse Rate 08/01/17 1455 70     Resp 08/01/17 1455 20     Temp 08/01/17 1455 98.5 F (36.9 C)     Temp src --      SpO2 08/01/17 1455 95 %     Weight --      Height --      Head Circumference --      Peak Flow --      Pain Score 08/01/17 1500 0     Pain Loc --      Pain Edu? --      Excl. in [place]? --    No data found.  Updated Vital Signs BP (!) 152/102 (BP Location: Left Arm)   Pulse 70   Temp 98.5 F (36.9 C)   Resp 20   SpO2 95%   Visual Acuity Right Eye Distance:   Left Eye Distance:   Bilateral Distance:    Right Eye Near:   Left Eye Near:    Bilateral Near:     Physical Exam  Constitutional: She is oriented to person, place, and time. She appears well-developed. She appears distressed.  Anxious   HENT:  Head: Normocephalic.  Cardiovascular: Normal rate, regular rhythm and normal heart sounds.  No tachycardia.   Pulmonary/Chest:  She has rhonchi and inspiratory, expiratory wheezing throughout lung fields.  Slightly tachypneic during exam.   Musculoskeletal: Normal range of motion.  No edema or pain in the lower extremities.   Neurological: She is alert and oriented to person, place, and time.  Skin: Skin is warm and dry. Capillary refill takes less than 2 seconds.  Psychiatric: She has a normal mood and affect.     UC Treatments / Results  Labs (all labs ordered are listed, but only abnormal results are displayed) Labs Reviewed  POCT I-STAT, CHEM 8 - Abnormal; Notable for the following components:      Result Value   Calcium, Ion 1.08 (*)    All other components within normal limits    EKG None  Radiology Dg Chest 2  View  Result Date: 08/01/2017 CLINICAL DATA:  Feeling unwell since June 25th: Cough, chest congestion. History of pneumonia. EXAM: CHEST - 2 VIEW COMPARISON:  Chest radiograph January 02, 2017 FINDINGS: Cardiac silhouette is upper limits of normal in size. Mediastinal silhouette is not suspicious. Mild bronchitic changes. Patchy LEFT mid lung zone airspace opacity. Small pleural effusion best appreciated on the lateral radiograph, likely on the RIGHT. LEFT lung base granuloma. No pneumothorax. Soft tissue planes and included osseous structures are non suspicious. Mild degenerative change of the thoracic spine. Surgical clips in the included abdomen compatible with cholecystectomy. IMPRESSION: 1. LEFT mid lung zone atelectasis versus pneumonia. Mild bronchitic changes. Small pleural effusion. 2. Borderline cardiomegaly. Electronically Signed   By: Elon Alas M.D.   On: 08/01/2017 15:55    Procedures Procedures (including critical care time)  Medications Ordered in UC Medications  ipratropium-albuterol (DUONEB) 0.5-2.5 (3) MG/3ML nebulizer solution 3 mL (3 mLs Nebulization Given 08/01/17 1553)    Initial Impression / Assessment and Plan / UC Course  I have reviewed the triage vital signs and the nursing notes.  Pertinent labs & imaging results that were available during my care of the patient were reviewed by me and considered in my medical decision making (see chart for details).     Obtaining chest x-ray to rule out pneumonia, vs CHF vs pulmonary edema.  I-STAT Chem-8 to evaluate potassium level.  Per pt request.  DuoNeb in clinic for shortness of breath, wheezing.  Differentials  include PE, CHF. Less likely but considered.  No hx or family of DVT or PE. HR normal. No LE swelling or pain.    Chest x-ray showed possible pneumonia with small pleural effusion and mild bronchitic changes. No CHF.  I-STAT Chem-8 was normal.   Lung sounds and tachypnea mildly improved post DuoNeb.    Spoke with patient about the benefits of prednisone outweighing the risk.  She is agreeing to take the prednisone starting tomorrow when her husband arrives home from out of town.  Warned if she has any severe reaction to go to the ER.  Previous reaction included flushing to face, no anaphylaxis. Told to take benadryl for hives or itching.   Continue doxycycline for the pneumonia and guaifenesin for cough and congestion.  Continue to use albuterol inhaler as needed for wheezing, shortness of breath.  Warned that if she develops any severe or worsening symptoms to include sharp chest pain, increased HR, increased work of breathing or dizziness please go straight to the ER.   Pt agreeable to plan. Will call her in the am to check on how she is doing.     Final Clinical Impressions(s) / UC Diagnoses   Final diagnoses:  Bronchitis  Community acquired pneumonia of left lung, unspecified part of lung     Discharge Instructions     It was nice meeting you!!  Your chest x-ray showed pneumonia and bronchitis. Your potassium was normal.  Continue taking the doxycycline for the full 10 days to treat the pneumonia. I am prescribing you prednisone taper for inflammation in your lungs.  Please start the prednisone as soon as you can. Use the albuterol inhaler every 6 hours as needed for wheezing, shortness of breath.  I am also giving you medication for cough and congestion. If you develop any severe symptoms to include extreme shortness of breath, chest pain, dizziness please go to the ER.      ED Prescriptions    Medication Sig Dispense Auth. Provider   predniSONE (STERAPRED UNI-PAK 21 TAB) 10 MG (21) TBPK tablet Take by mouth daily. Take 6 tabs by mouth daily  for 1 day, then 5 tabs for 1days, then 4 tabs for 1days, then 3 tabs for 1days, 2 tabs for 1days, then 1 tab by mouth daily for 1days 21 tablet Sharita Bienaime A, NP   guaiFENesin (ROBITUSSIN) 100 MG/5ML liquid Take 5-10 mLs (100-200 mg  total) by mouth every 4 (four) hours as needed for cough. 60 mL Loura Halt A, NP     Controlled Substance Prescriptions [place] Controlled Substance Registry consulted? Not Applicable   Orvan July, NP 08/01/17 [phone]

## 2017-08-01 NOTE — ED Triage Notes (Signed)
Pt was diagnosed with bronchitis on June 25 and given an antibiotic.  She saw her PCP this Saturday and was given three new medications.  Pt is here today because she has been SOB, having anxiety, blood in her sputum and near syncope from coughing so hard.

## 2017-08-02 ENCOUNTER — Ambulatory Visit: Payer: Self-pay | Admitting: *Deleted

## 2017-08-02 NOTE — Telephone Encounter (Signed)
I spoke with pt, said she is feeling a little better, not having dizziness right now, no trouble breathing. She will call the back to schedule the appointment with Dr. Regis Bill, advised to make sure it is a ER follow up, to have enough time for this visit, pt agreed.

## 2017-08-02 NOTE — Telephone Encounter (Signed)
difficult to answer  Questions  Since I have not  Seeing her for   This problem   (BUt it should be ok to take the [facility] as this is a topical medication .  For wheezing )  If she has severe shortness of breath   Worsening pain or fever chills    then seek emergent care  .( ed)  Otherwise     Cam make fu appt   With me tomorrow . or Friday  ( 2 slots )

## 2017-08-02 NOTE — Telephone Encounter (Signed)
Patient was seen at [facility]- diagnosed with bronchitis and pneumonia. Patient is currently on doxycycline and she was given inhaler- Dulara. Patient was given prednisone and she has had a reaction to it before- so she is waiting to use it.  Patient went to [facility] Saturday and she was given injection- and antibiotic. She started with dizziness and coughing yesterday- congestion was moving out. Patient went to [facility] and was given breathing treatment and she felt better. Patient has been feeling lightheaded today. Patient has been using expectorant last night- she has not coughed as much today.The dizziness makes her feel more swimmy headed. She is able to walk without feeling faint. She does feel very fatigued. Patient is going to take her Doxycycline and her BP medication today- she is going to wait until her husband gets home before she starts her Dulara.  Patient does have questions for her PCP- she was given a Rx for prednisone- she was told to take with benadryl. She wants to know if she should take this given she has had a reaction to prednisone in the past. She also wants to know when to follow up-  She can be reached at [phone]    Reason for Disposition . [1] MILD dizziness (e.g., walking normally) AND [2] has been evaluated by physician for this  Answer Assessment - Initial Assessment Questions 1. DESCRIPTION: "Describe your dizziness."     swimmy feeling in head 2. LIGHTHEADED: "Do you feel lightheaded?" (e.g., somewhat faint, woozy, weak upon standing)     Not faint 3. VERTIGO: "Do you feel like either you or the room is spinning or tilting?" (i.e. vertigo)     no 4. SEVERITY: "How bad is it?"  "Do you feel like you are going to faint?" "Can you stand and walk?"   - MILD - walking normally   - MODERATE - interferes with normal activities (e.g., work, school)    - SEVERE - unable to stand, requires support to walk, feels like passing out now.      mild 5. ONSET:  "When did the dizziness  begin?"     Several days ago- has been worse 6. AGGRAVATING FACTORS: "Does anything make it worse?" (e.g., standing, change in head position)     n/a 7. HEART RATE: "Can you tell me your heart rate?" "How many beats in 15 seconds?"  (Note: not all patients can do this)       Not asked 8. CAUSE: "What do you think is causing the dizziness?"     illness 9. RECURRENT SYMPTOM: "Have you had dizziness before?" If so, ask: "When was the last time?" "What happened that time?"     Yes- current 10. OTHER SYMPTOMS: "Do you have any other symptoms?" (e.g., fever, chest pain, vomiting, diarrhea, bleeding)       cough 11. PREGNANCY: "Is there any chance you are pregnant?" "When was your last menstrual period?"       n/a  Protocols used: [facility]

## 2017-08-22 DIAGNOSIS — H02831 Dermatochalasis of right upper eyelid: Secondary | ICD-10-CM | POA: Diagnosis not present

## 2017-08-29 DIAGNOSIS — Z8041 Family history of malignant neoplasm of ovary: Secondary | ICD-10-CM | POA: Diagnosis not present

## 2017-08-29 DIAGNOSIS — N83201 Unspecified ovarian cyst, right side: Secondary | ICD-10-CM | POA: Diagnosis not present

## 2017-08-31 DIAGNOSIS — Z6841 Body Mass Index (BMI) 40.0 and over, adult: Secondary | ICD-10-CM | POA: Diagnosis not present

## 2017-08-31 DIAGNOSIS — N83201 Unspecified ovarian cyst, right side: Secondary | ICD-10-CM | POA: Diagnosis not present

## 2017-08-31 DIAGNOSIS — Z8041 Family history of malignant neoplasm of ovary: Secondary | ICD-10-CM | POA: Diagnosis not present

## 2017-11-12 NOTE — Progress Notes (Signed)
Chief Complaint  Patient presents with  . Thigh numbness    Numbness in right thigh with burning sensation x 1 month.   . Cyst    Cyst on left foot  . Leg Pain    Left leg pain - achy feeling. Previous sciatic nerve pain. Pain and achiness at night is worse.    HPI: Alicia [name] 62 y.o. come in for  A number of issues     numbess right thigh for months lateral area no injury comes and goes burning at times.  No weakness in the right leg. Left leg   pain most recently is more problematic over the last weeks to months.  She states she has baseline sciatica from a fall a couple years ago however she has not newer pain with continuous waxing and waning deeper pain in the left leg knee down the shin to the foot and upper without specific hip pain.  There is no weakness or burning however it is problematic and is hard for her to walk at times.  This weekend it was hard for her to get out of bed.  No bowel or bladder changes. She also has noted on her left foot small cystic area this is nontender uncertain significance.   ROS: See pertinent positives and negatives per HPI.  Under observation for ovarian cyst mass.  Past Medical History:  Diagnosis Date  . Allergy   . Chronic tension headaches   . Hx of varicella   . Hypertension   . Kidney stones   . Near syncope   . Nephrolithiasis    history of  passed on own.   . Palpitations   . Thyroid disease   . Varicose veins     Family History  Problem Relation Age of Onset  . Cancer Mother        CERVICAL uterin cancer   . Liver disease Mother        from chronic heaptitis   . Diabetes Mother   . Heart disease Father        avdisease avr  . Stroke Father   . Cancer Sister        Ovarian and endometrial  . Cancer Maternal Aunt        Colon  . Heart disease Maternal Grandfather   . Heart disease Paternal Grandmother   . Heart disease Paternal Grandfather   . Diabetes Sister   . Diabetes Brother     Social History     Socioeconomic History  . Marital status: Married    Spouse name: Not on file  . Number of children: 3  . Years of education: 7  . Highest education level: Not on file  Occupational History  . Occupation: Product manager: [employer]  . Financial resource strain: Not on file  . Food insecurity:    Worry: Not on file    Inability: Not on file  . Transportation needs:    Medical: Not on file    Non-medical: Not on file  Tobacco Use  . Smoking status: Never Smoker  . Smokeless tobacco: Never Used  Substance and Sexual Activity  . Alcohol use: No  . Drug use: No  . Sexual activity: Yes    Partners: Male  Lifestyle  . Physical activity:    Days per week: Not on file    Minutes per session: Not on file  . Stress: Not on file  Relationships  .  Social connections:    Talks on phone: Not on file    Gets together: Not on file    Attends religious service: Not on file    Active member of club or organization: Not on file    Attends meetings of clubs or organizations: Not on file    Relationship status: Not on file  Other Topics Concern  . Not on file  Social History Narrative   [employer]- BA. 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 2 daughters- '82, '93, 1 son- '87. 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 acetaminophen (TYLENOL) 325 MG tablet Take 650 mg by mouth every 6 (six) hours as needed for mild pain.    Marland Kitchen albuterol (PROVENTIL HFA;VENTOLIN HFA) 108 (90 Base) MCG/ACT inhaler INHALE 2 PUFFS EVERY 4 TO 6 HOURS AS NEEDED FOR CHEST TIGHTNESS/SHORTNESS OF BREATH/WHEEZING  0  . benzonatate (TESSALON) 100 MG capsule TAKE ONE CAPSULE BY MOUTH 3 TIMES A DAY AS  NEEDED FOR COUGH  0  . chlorthalidone (HYGROTON) 25 MG tablet Take 1 tablet (25 mg total) by mouth daily. 30 tablet 11  . doxycycline (VIBRA-TABS) 100 MG tablet Take 1 tablet (100 mg total) by mouth 2 (two) times daily. 20 tablet 0  . erythromycin base (E-MYCIN) 500 MG tablet Take 500 mg by mouth every 12 (twelve) hours.  0  . guaiFENesin (ROBITUSSIN) 100 MG/5ML liquid Take 5-10 mLs (100-200 mg total) by mouth every 4 (four) hours as needed for cough. 60 mL 0  . mometasone-formoterol (DULERA) 100-5 MCG/ACT AERO 2 puffs bid 1 Inhaler 0  . Multiple Vitamin (MULTIVITAMIN WITH MINERALS) TABS tablet Take 1 tablet by mouth daily. Women's formula    . potassium chloride SA (K-DUR,KLOR-CON) 20 MEQ tablet Take 2 tablets (40 mEq total) by mouth as directed. Take 2 tabs in the AM. 60 tablet 3  . predniSONE (STERAPRED UNI-PAK 21 TAB) 10 MG (21) TBPK tablet Take by mouth daily. Take 6 tabs by mouth daily  for 1 day, then 5 tabs for 1days, then 4 tabs for 1days, then 3 tabs for 1days, 2 tabs for 1days, then 1 tab by mouth daily for 1days 21 tablet 0   No facility-administered medications prior to visit.      EXAM:  BP 120/82 (BP Location: Right Arm, Patient Position: Sitting, Cuff Size: Normal)   Pulse (!) 55   Temp 98.1 F (36.7 C) (Oral)   Wt 257 lb 8 oz (116.8 kg)   BMI 41.56 kg/m   Body mass index is 41.56 kg/m.  GENERAL: vitals reviewed and listed above, alert, oriented, appears well hydrated and in no acute distress HEENT: atraumatic, conjunctiva  clear, no obvious abnormalities on inspection of external nose and ears NECK: no obvious masses on inspection palpation  Gait is mildly antalgic but steady.  In no acute distress no midline back pain.  Abdomen soft without organomegaly obvious.  Left hip good range of motion supine passive. A number of varicose veins bilaterally left more than right but no acute findings no acute swelling or edema some tenderness around the knee but no acute swelling.  Negative SLR no obvious major focal neurologic abnormality. Did not test sensation of her right thrive but points to the lateral cutaneous area of distribution. There is a small nontender cystic lesion over the left lateral foot below the ankle. PSYCH: pleasant and cooperative, no obvious depression or anxiety Lab Results  Component Value Date   WBC 7.9 01/02/2017   HGB 14.6 08/01/2017   HCT 43.0 08/01/2017   PLT 171 01/02/2017   GLUCOSE 83 08/01/2017   CHOL 205 (H) 06/19/2017   TRIG 125 06/19/2017   HDL 65 06/19/2017   LDLCALC 115 (H) 06/19/2017   ALT 25 01/02/2017   AST 34 01/02/2017   NA 140 08/01/2017   K 3.8 08/01/2017   CL 102 08/01/2017   CREATININE 0.70 08/01/2017   BUN 14 08/01/2017   CO2 24 06/19/2017   TSH 2.92 07/17/2015   INR 1.0 ratio 04/03/2009   HGBA1C 5.5 07/17/2015   BP Readings from Last 3 Encounters:  11/13/17 120/82  08/01/17 (!) 152/102  07/29/17 120/82    ASSESSMENT AND PLAN:  Discussed the following assessment and plan:  Left leg pain - Plan: VAS [place] LOWER EXTREMITY VENOUS (DVT)  Neuritis - Plan: VAS [place] LOWER EXTREMITY VENOUS (DVT)  Varicose veins of both lower extremities, unspecified whether complicated Uncertain cause of her pain that is progressing but is probably related to back but atypical and not obvious.  Get Doppler because of her varicose veins in the quality of her pain. Burning on the right leg sounds like lateral cutaneous nerve symptoms.  Uncertain etiology.  Currently not problematic.  Anti-inflammatories follow-up as planned in 2 to 3 weeks either by my chart or follow-up visit. Patient agrees to not think the left foot cyst is serious to her health at this time. -Patient advised to return or notify health care team  if  new concerns arise.  Patient Instructions  I agree sounds like form back but atypical .  Radiation to   agree  with using   antiinflammatory    In the interim .   Take 2 aleve   Twice a day for 10 days  .     Will get   Ultrasound  To check venous  System for clots .   depending  If so  consider getting   Neuro or ortho  Evaluation for futher opinion.     Plan fu  In 2-  3 weeks if not improving to decide in next step .      [place] Brooking. Marq Rebello M.D.

## 2017-11-13 ENCOUNTER — Ambulatory Visit: Payer: Federal, State, Local not specified - PPO | Admitting: Internal Medicine

## 2017-11-13 ENCOUNTER — Encounter

## 2017-11-13 ENCOUNTER — Encounter: Payer: Self-pay | Admitting: Internal Medicine

## 2017-11-13 VITALS — BP 120/82 | HR 55 | Temp 98.1°F | Wt 257.5 lb

## 2017-11-13 DIAGNOSIS — I8393 Asymptomatic varicose veins of bilateral lower extremities: Secondary | ICD-10-CM

## 2017-11-13 DIAGNOSIS — M79605 Pain in left leg: Secondary | ICD-10-CM | POA: Diagnosis not present

## 2017-11-13 DIAGNOSIS — M792 Neuralgia and neuritis, unspecified: Secondary | ICD-10-CM

## 2017-11-13 NOTE — Patient Instructions (Addendum)
I agree sounds like form back but atypical .  Radiation to   agree  with using   antiinflammatory    In the interim .   Take 2 aleve   Twice a day for 10 days  .   Will get   Ultrasound  To check venous  System for clots .   depending  If so  consider getting   Neuro or ortho  Evaluation for futher opinion.     Plan fu  In 2-  3 weeks if not improving to decide in next step .

## 2017-11-18 IMAGING — MG 2D DIGITAL DIAGNOSTIC BILATERAL MAMMOGRAM WITH CAD AND ADJUNCT T
8 of 16 series · 8 of 36 positions shown · non-contrast
Comparison: Previous exam(s).

CLINICAL DATA: Patient presents with a linear skin indentation
along upper outer left breast. She has had a relatively recent right
shoulder rotator cuff surgery and has been sleeping on her left
side. No reported breast lumps.

EXAM:
2D DIGITAL DIAGNOSTIC BILATERAL MAMMOGRAM WITH CAD AND ADJUNCT TOMO
ULTRASOUND LEFT BREAST

[L MLO (1 of 2)]
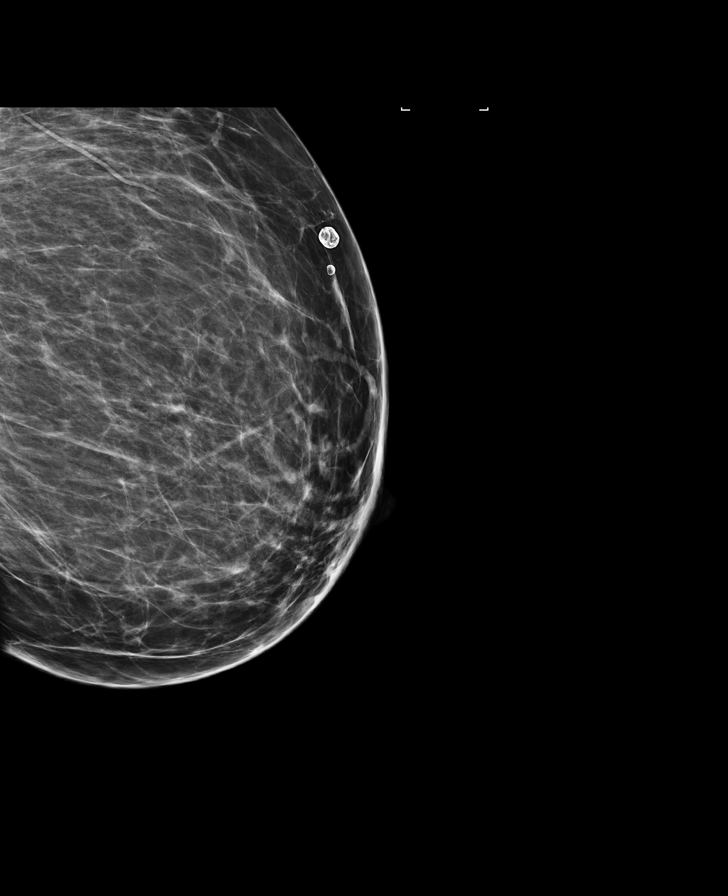

[L CC synth-2D]
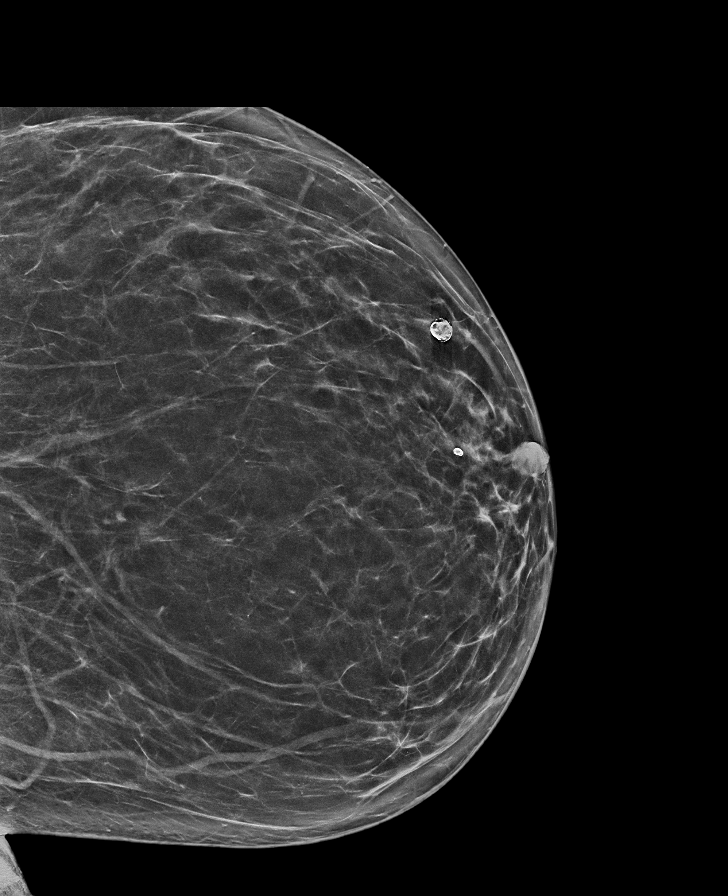

[R CC]
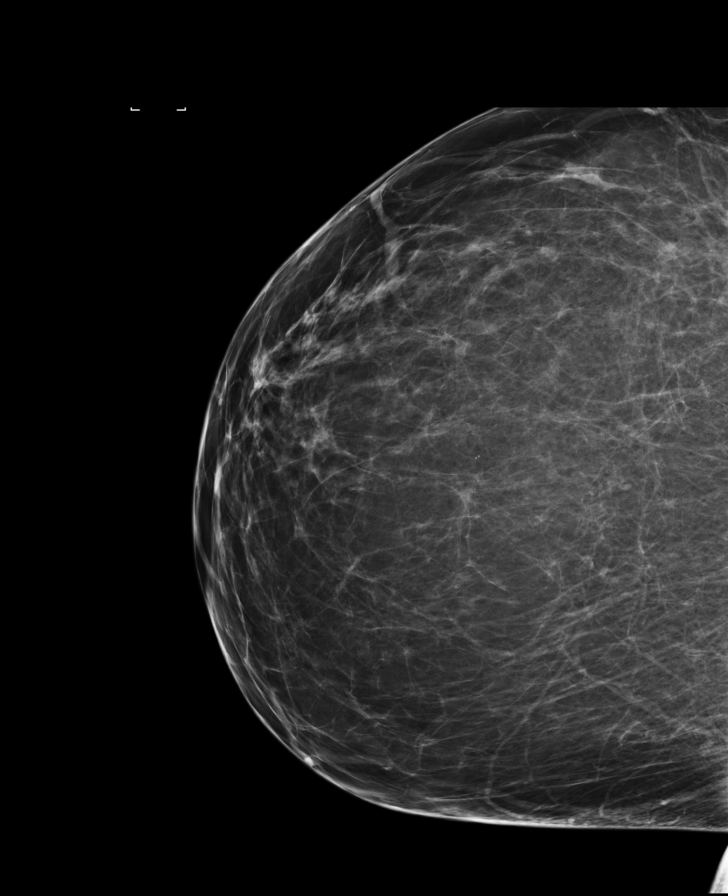

[L MLO synth-2D]
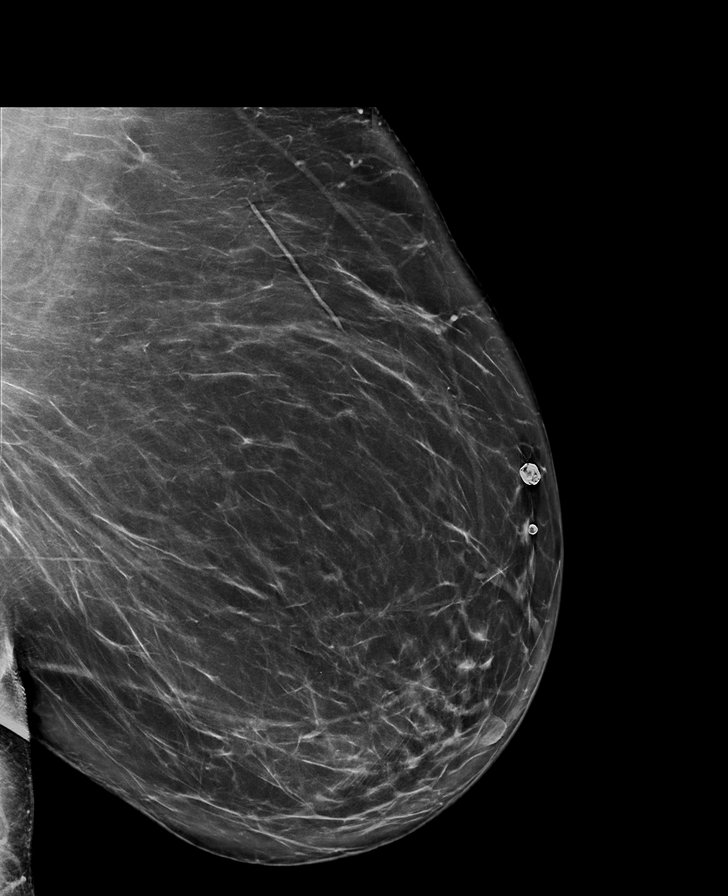

[L TAN]
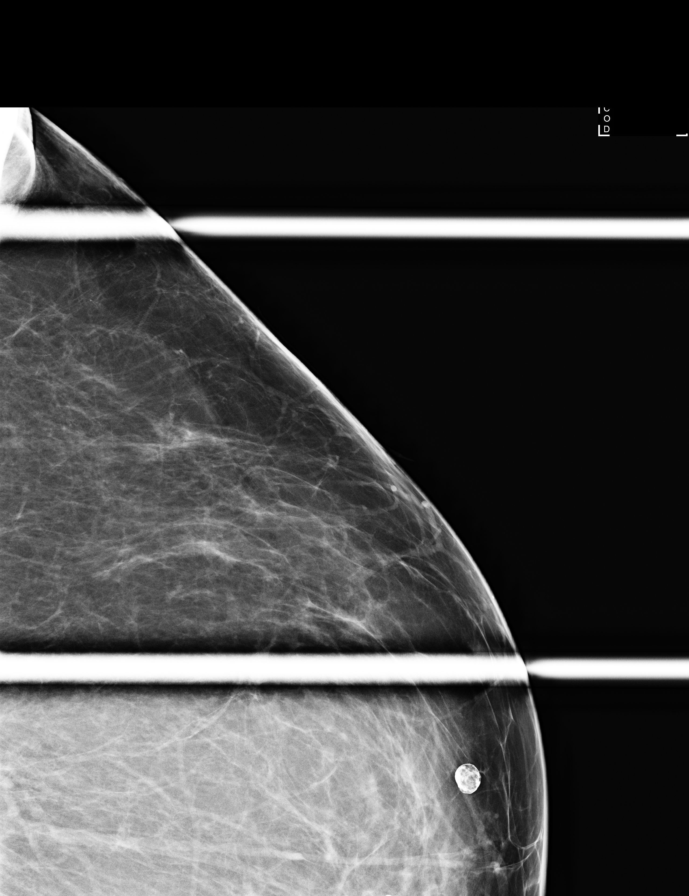

[L MLO (2 of 2)]
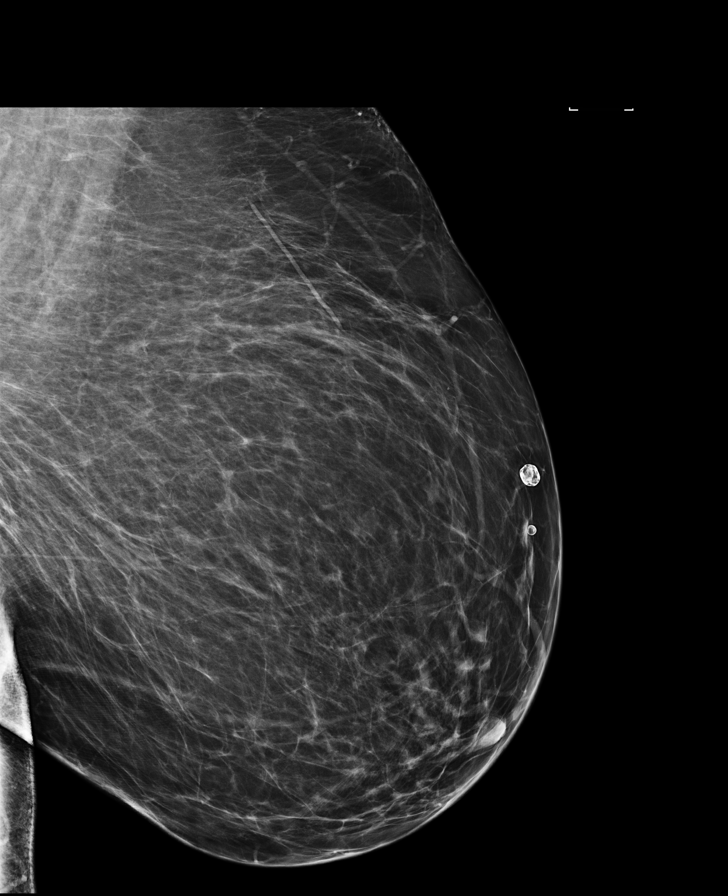

[L CC]
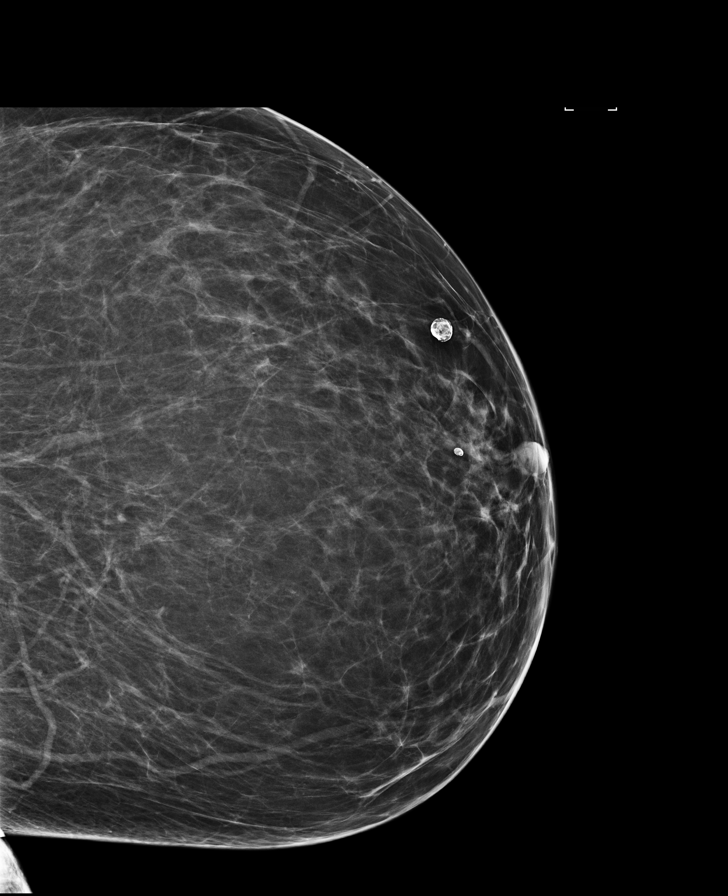

[R MLO]
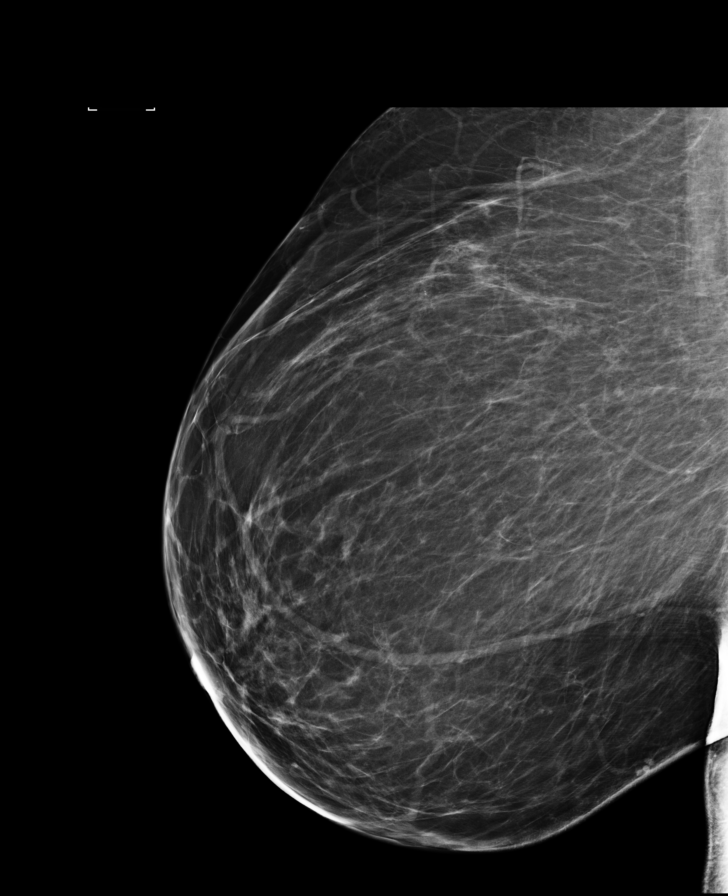

[8 of 36 positions shown; findings below may reference images not displayed]

ACR Breast Density Category b: There are scattered areas of
fibroglandular density.
FINDINGS: There are no discrete masses, areas architectural distortion, areas
of significant asymmetry or suspicious calcifications. No
mammographic change.

Mammographic images were processed with CAD.

On physical exam, there is a subtle linear skin crease with mild
reddish discoloration along the upper outer left breast with no
underlying palpable lump.

Targeted ultrasound is performed, showing normal fibroglandular
tissue deep to the subtle skin indentation. No mass, cyst or
suspicious lesion.
IMPRESSION: 1. No evidence of breast malignancy.
2. The skin indication likely a skin crease related to the patient's
sleeping on her left side.

RECOMMENDATION:
Screening mammogram in one year.(Code:[id number])

I have discussed the findings and recommendations with the patient.
Results were also provided in writing at the conclusion of the
visit. If applicable, a reminder letter will be sent to the patient
regarding the next appointment.

BI-RADS CATEGORY  1: Negative.

## 2017-11-20 ENCOUNTER — Ambulatory Visit (HOSPITAL_COMMUNITY)
Admission: RE | Admit: 2017-11-20 | Discharge: 2017-11-20 | Disposition: A | Discharge status: Home / Self Care | Payer: Federal, State, Local not specified - PPO | Source: Ambulatory Visit | Attending: Internal Medicine | Admitting: Internal Medicine

## 2017-11-20 DIAGNOSIS — M79605 Pain in left leg: Secondary | ICD-10-CM | POA: Diagnosis not present

## 2017-11-20 DIAGNOSIS — M792 Neuralgia and neuritis, unspecified: Secondary | ICD-10-CM | POA: Diagnosis not present

## 2017-11-29 ENCOUNTER — Other Ambulatory Visit: Payer: Self-pay | Admitting: Internal Medicine

## 2017-11-29 MED ORDER — CHLORTHALIDONE 25 MG PO TABS: 25.0000 mg | ORAL_TABLET | Freq: Every day | ORAL | 2 refills | Status: DC

## 2017-11-29 NOTE — Telephone Encounter (Signed)
Pt's medication was sent to pt's pharmacy as requested. Confirmation received.  °

## 2017-11-29 NOTE — Telephone Encounter (Signed)
°*  STAT* If patient is at the pharmacy, call can be transferred to refill team.   1. Which medications need to be refilled? (please list name of each medication and dose if known)  Chlorthalidone- first appt pt can get with Dr Harrington Challenger is February  2. Which pharmacy/location (including street and city if local pharmacy) is medication to be sent to? [facility] [phone]  3. Do they need a 30 day or 90 day supply? 90 and refills

## 2018-01-03 ENCOUNTER — Telehealth: Payer: Self-pay

## 2018-01-03 DIAGNOSIS — M79605 Pain in left leg: Secondary | ICD-10-CM

## 2018-01-03 NOTE — Telephone Encounter (Signed)
Copied from [place] [phone]. Topic: Referral - Request for Referral >> Jan 03, 2018  4:03 PM Percell Belt A wrote: Has patient seen PCP for this complaint? Yes  *If NO, is insurance requiring patient see PCP for this issue before PCP can refer them? Referral for which specialty: Neurology  Preferred provider/office:  NA  Reason for referral: left leg pain . She stated she was suppose to try a few things that Dr Regis Bill has suggested and if it didn't help she was to call back and request referral to Neurology

## 2018-01-05 NOTE — Telephone Encounter (Signed)
Please advise Dr Panosh, thanks.   

## 2018-01-08 NOTE — Telephone Encounter (Signed)
Please  Do referral to neurology( see my last OV note)

## 2018-01-22 NOTE — Telephone Encounter (Signed)
Referral placed.  Left detailed message making aware that someone will reach out to schedule.  Nothing further needed.

## 2018-02-14 DIAGNOSIS — M5417 Radiculopathy, lumbosacral region: Secondary | ICD-10-CM | POA: Diagnosis not present

## 2018-02-19 ENCOUNTER — Encounter: Payer: Self-pay | Admitting: Internal Medicine

## 2018-02-19 ENCOUNTER — Ambulatory Visit: Payer: Federal, State, Local not specified - PPO | Admitting: Internal Medicine

## 2018-02-19 VITALS — BP 130/86 | HR 72 | Ht 66.0 in | Wt 256.2 lb

## 2018-02-19 DIAGNOSIS — I10 Essential (primary) hypertension: Secondary | ICD-10-CM | POA: Diagnosis not present

## 2018-02-19 DIAGNOSIS — E782 Mixed hyperlipidemia: Secondary | ICD-10-CM | POA: Diagnosis not present

## 2018-02-19 NOTE — Patient Instructions (Signed)
Medication Instructions:  No changes If you need a refill on your cardiac medications before your next appointment, please call your pharmacy.   Lab work: BMET today If you have labs (blood work) drawn today and your tests are completely normal, you will receive your results only by: Marland Kitchen MyChart Message (if you have MyChart) OR . A paper copy in the mail If you have any lab test that is abnormal or we need to change your treatment, we will call you to review the results.  Testing/Procedures: none  Follow-Up: At [facility], you and your health needs are our priority.  As part of our continuing mission to provide you with exceptional heart care, we have created designated Provider Care Teams.  These Care Teams include your primary Cardiologist (physician) and Advanced Practice Providers (APPs -  Physician Assistants and Nurse Practitioners) who all work together to provide you with the care you need, when you need it. You will need a follow up appointment in:  9 months.  Please call our office 2 months in advance to schedule this appointment.  You may see Dorris Carnes, MD or one of the following Advanced Practice Providers on your designated Care Team: Richardson Dopp, PA-C [place], [place] . Daune Perch, NP  Any Other Special Instructions Will Be Listed Below (If Applicable).

## 2018-02-19 NOTE — Progress Notes (Signed)
Cardiology Office Note   Date:  02/19/2018   ID:  Alicia [name], Alicia [name] [birth year], MRN [id number]  PCP:  Burnis Medin, MD  Cardiologist:   Dorris Carnes, MD   Pt presents for f/u of CP and HTN   History of Present Illness: Alicia [name] is a 63 y.o. female with a history of CP(atypical), palpitation I saw her in 2018  Echo LVEF normal  Mild diastolic dysfunction Seen in ED on 01/02/17 for flu like symtoms  K ws low   I last saw the pt in June 2019    Patient had pneumonia in [place]   Has sciatica due to lumber disc  Breathing is OK   No CP   Starting physical therapy  Seen by physiatry in [facility] on gabapenti    Pt contiues to Biomedical engineer at [employer]     Current Outpatient Medications  Medication Sig Dispense Refill  . acetaminophen (TYLENOL) 325 MG tablet Take 650 mg by mouth every 6 (six) hours as needed for mild pain.    . chlorthalidone (HYGROTON) 25 MG tablet Take 1 tablet (25 mg total) by mouth daily. 90 tablet 2  . gabapentin (NEURONTIN) 300 MG capsule Take 300 mg by mouth every evening.    . Multiple Vitamin (MULTIVITAMIN WITH MINERALS) TABS tablet Take 1 tablet by mouth daily. Women's formula    . potassium chloride SA (K-DUR,KLOR-CON) 20 MEQ tablet Take 2 tablets (40 mEq total) by mouth as directed. Take 2 tabs in the AM. 60 tablet 3   No current facility-administered medications for this visit.     Allergies:   Ciprofloxacin hcl; Iodinated diagnostic agents; Iohexol; Morphine; Sulfa antibiotics; Prednisone; Valsartan; Lidocaine hcl; Shellfish allergy; Ciprofloxacin; Penicillins; Sulfamethoxazole-trimethoprim; and Sulfonamide derivatives   Past Medical History:  Diagnosis Date  . Allergy   . Chronic tension headaches   . Hx of varicella   . Hypertension   . Kidney stones   . Near syncope   . Nephrolithiasis    history of  passed on own.   . Palpitations   . Thyroid disease   . Varicose veins     Past Surgical  History:  Procedure Laterality Date  . ABDOMINAL HYSTERECTOMY     fibroids non cancer   . CATARACT EXTRACTION     Bilateral  . cataract surgery Right 03-02-2012  . CHOLECYSTECTOMY    . TONSILLECTOMY       Social History:  The patient  reports that she has never smoked. She has never used smokeless tobacco. She reports that she does not drink alcohol or use drugs.   Family History:  The patient's family history includes Cancer in her maternal aunt, mother, and sister; Diabetes in her brother, mother, and sister; Heart disease in her father, maternal grandfather, paternal grandfather, and paternal grandmother; Liver disease in her mother; Stroke in her father.    ROS:  Please see the history of present illness. All other systems are reviewed and  Negative to the above problem except as noted.    PHYSICAL EXAM: VS:  BP 130/86   Pulse 72   Ht 5\' 6"  (1.676 m)   Wt 256 lb 3.2 oz (116.2 kg)   SpO2 97%   BMI 41.35 kg/m   GEN:  Morbidly obese 63 yo  in no acute distress  HEENT: normal  Neck:  JVP is not elevated   No  carotid bruits, or masses Cardiac: RRR; No signif  murmur  No  , rubs, or gallops,  Tr edema  Respiratory:  clear to auscultation bilaterally, normal work of breathing GI: soft, nontender, nondistended, + BS  No hepatomegaly  MS: no deformity Moving all extremities   Skin: warm and dry, no rash  EKG:  EKG is ordered today.   SR 77 bpm  Nonspecific ST changes      Lipid Panel    Component Value Date/Time   CHOL 205 (H) 06/19/2017 1643   TRIG 125 06/19/2017 1643   HDL 65 06/19/2017 1643   CHOLHDL 3.2 06/19/2017 1643   CHOLHDL 3 07/17/2015 1500   VLDL 27.8 07/17/2015 1500   LDLCALC 115 (H) 06/19/2017 1643      Wt Readings from Last 3 Encounters:  02/19/18 256 lb 3.2 oz (116.2 kg)  11/13/17 257 lb 8 oz (116.8 kg)  07/29/17 255 lb 12.8 oz (116 kg)      ASSESSMENT AND PLAN:  1  CP  Pt denies  2.  HTN  BP is good  Keep on same meds   Will check K today    She stopped taking K supplements a couple months ago    3  HCM  Lpids are pretty good   HDL 65  LDL 115  (June 2019)  4   Sciatica   Pt being seen in [place] by PMR   About to enter PT   Check BMET today    F/U in the fall    Current medicines are reviewed at length with the patient today.  The patient does not have concerns regarding medicines.  F/U with me as needed if symptoms change   Signed, Dorris Carnes, MD  02/19/2018 [address], [place],   [address] Phone: [phone]; Fax: [phone]

## 2018-02-20 LAB — BASIC METABOLIC PANEL
BUN/Creatinine Ratio: 31 — ABNORMAL HIGH (ref 12–28)
BUN: 20 mg/dL (ref 8–27)
CO2: 22 mmol/L (ref 20–29)
Calcium: 9.2 mg/dL (ref 8.7–10.3)
Chloride: 103 mmol/L (ref 96–106)
Creatinine, Ser: 0.65 mg/dL (ref 0.57–1.00)
GFR calc Af Amer: 111 mL/min/{1.73_m2} (ref >59)
GFR calc non Af Amer: 96 mL/min/{1.73_m2} (ref >59)
Glucose: 92 mg/dL (ref 65–99)
Potassium: 3.8 mmol/L (ref 3.5–5.2)
Sodium: 142 mmol/L (ref 134–144)

## 2018-02-28 DIAGNOSIS — M25562 Pain in left knee: Secondary | ICD-10-CM | POA: Diagnosis not present

## 2018-02-28 DIAGNOSIS — M545 Low back pain: Secondary | ICD-10-CM | POA: Diagnosis not present

## 2018-02-28 DIAGNOSIS — M79605 Pain in left leg: Secondary | ICD-10-CM | POA: Diagnosis not present

## 2018-02-28 DIAGNOSIS — R202 Paresthesia of skin: Secondary | ICD-10-CM | POA: Diagnosis not present

## 2018-03-01 DIAGNOSIS — Z8041 Family history of malignant neoplasm of ovary: Secondary | ICD-10-CM | POA: Diagnosis not present

## 2018-03-01 DIAGNOSIS — N83201 Unspecified ovarian cyst, right side: Secondary | ICD-10-CM | POA: Diagnosis not present

## 2018-03-06 DIAGNOSIS — M79605 Pain in left leg: Secondary | ICD-10-CM | POA: Diagnosis not present

## 2018-03-06 DIAGNOSIS — R202 Paresthesia of skin: Secondary | ICD-10-CM | POA: Diagnosis not present

## 2018-03-06 DIAGNOSIS — M25562 Pain in left knee: Secondary | ICD-10-CM | POA: Diagnosis not present

## 2018-03-06 DIAGNOSIS — M545 Low back pain: Secondary | ICD-10-CM | POA: Diagnosis not present

## 2018-03-12 DIAGNOSIS — Z23 Encounter for immunization: Secondary | ICD-10-CM | POA: Diagnosis not present

## 2018-03-12 DIAGNOSIS — L821 Other seborrheic keratosis: Secondary | ICD-10-CM | POA: Diagnosis not present

## 2018-03-12 DIAGNOSIS — L700 Acne vulgaris: Secondary | ICD-10-CM | POA: Diagnosis not present

## 2018-03-13 DIAGNOSIS — R202 Paresthesia of skin: Secondary | ICD-10-CM | POA: Diagnosis not present

## 2018-03-13 DIAGNOSIS — M545 Low back pain: Secondary | ICD-10-CM | POA: Diagnosis not present

## 2018-03-13 DIAGNOSIS — M79605 Pain in left leg: Secondary | ICD-10-CM | POA: Diagnosis not present

## 2018-03-13 DIAGNOSIS — M25562 Pain in left knee: Secondary | ICD-10-CM | POA: Diagnosis not present

## 2018-03-15 DIAGNOSIS — M79605 Pain in left leg: Secondary | ICD-10-CM | POA: Diagnosis not present

## 2018-03-15 DIAGNOSIS — R202 Paresthesia of skin: Secondary | ICD-10-CM | POA: Diagnosis not present

## 2018-03-15 DIAGNOSIS — M25562 Pain in left knee: Secondary | ICD-10-CM | POA: Diagnosis not present

## 2018-03-15 DIAGNOSIS — M545 Low back pain: Secondary | ICD-10-CM | POA: Diagnosis not present

## 2018-03-20 DIAGNOSIS — M79605 Pain in left leg: Secondary | ICD-10-CM | POA: Diagnosis not present

## 2018-03-20 DIAGNOSIS — R202 Paresthesia of skin: Secondary | ICD-10-CM | POA: Diagnosis not present

## 2018-03-20 DIAGNOSIS — M545 Low back pain: Secondary | ICD-10-CM | POA: Diagnosis not present

## 2018-03-20 DIAGNOSIS — M25562 Pain in left knee: Secondary | ICD-10-CM | POA: Diagnosis not present

## 2018-03-22 DIAGNOSIS — M79605 Pain in left leg: Secondary | ICD-10-CM | POA: Diagnosis not present

## 2018-03-22 DIAGNOSIS — M545 Low back pain: Secondary | ICD-10-CM | POA: Diagnosis not present

## 2018-03-22 DIAGNOSIS — M25562 Pain in left knee: Secondary | ICD-10-CM | POA: Diagnosis not present

## 2018-03-22 DIAGNOSIS — R202 Paresthesia of skin: Secondary | ICD-10-CM | POA: Diagnosis not present

## 2018-08-02 ENCOUNTER — Other Ambulatory Visit: Payer: Self-pay | Admitting: Obstetrics and Gynecology

## 2018-08-02 DIAGNOSIS — Z1231 Encounter for screening mammogram for malignant neoplasm of breast: Secondary | ICD-10-CM

## 2018-08-06 DIAGNOSIS — B373 Candidiasis of vulva and vagina: Secondary | ICD-10-CM | POA: Diagnosis not present

## 2018-08-06 DIAGNOSIS — N763 Subacute and chronic vulvitis: Secondary | ICD-10-CM | POA: Diagnosis not present

## 2018-08-17 ENCOUNTER — Ambulatory Visit
Admission: RE | Admit: 2018-08-17 | Discharge: 2018-08-17 | Disposition: A | Discharge status: Home / Self Care | Payer: Federal, State, Local not specified - PPO | Source: Ambulatory Visit | Attending: Obstetrics and Gynecology | Admitting: Obstetrics and Gynecology

## 2018-08-17 ENCOUNTER — Other Ambulatory Visit: Payer: Self-pay

## 2018-08-17 DIAGNOSIS — Z1231 Encounter for screening mammogram for malignant neoplasm of breast: Secondary | ICD-10-CM

## 2018-08-22 ENCOUNTER — Other Ambulatory Visit: Payer: Self-pay

## 2018-08-22 ENCOUNTER — Encounter: Payer: Self-pay | Admitting: Internal Medicine

## 2018-08-22 ENCOUNTER — Ambulatory Visit (INDEPENDENT_AMBULATORY_CARE_PROVIDER_SITE_OTHER): Payer: Federal, State, Local not specified - PPO | Admitting: Internal Medicine

## 2018-08-22 DIAGNOSIS — R21 Rash and other nonspecific skin eruption: Secondary | ICD-10-CM | POA: Diagnosis not present

## 2018-08-22 DIAGNOSIS — R599 Enlarged lymph nodes, unspecified: Secondary | ICD-10-CM

## 2018-08-22 MED ORDER — DOXYCYCLINE HYCLATE 100 MG PO TABS: 100.0000 mg | ORAL_TABLET | Freq: Two times a day (BID) | ORAL | 0 refills | Status: DC

## 2018-08-22 NOTE — Progress Notes (Signed)
Virtual Visit via Video Note  I connected with@ on 08/22/18 at  3:15 PM EDT by a video enabled telemedicine application and verified that I am speaking with the correct person using two identifiers. Location patient: home Location provider:work office Persons participating in the virtual visit: patient, provider  [employer] national recommendations  regarding COVID 19 pandemic   video visit is advised over in office visit for this patient.  Patient aware  of the limitations of evaluation and management by telemedicine and  availability of in person appointments. and agreed to proceed.   HPI: Alicia [name] presents for video visit  Has had a rash that appear about 3 days ago left cheek Using lotrimin and now  Fading but minimal itching   Developed  Tender  Swelling under chin ear area left for the past 2+ days  A bit worse   Not related to eating chewing and no uri or fever sore throat. Had ebv infection with adenopathy  Dx 10 years ago and had ? Could this be related?    ROS: See pertinent positives and negatives per HPI. No fever chills sytems sx  Under rx for "yeast inf " from [facility] clobetisol after "2 pills"   Past Medical History:  Diagnosis Date  . Allergy   . Chronic tension headaches   . Hx of varicella   . Hypertension   . Kidney stones   . Near syncope   . Nephrolithiasis    history of  passed on own.   . Palpitations   . Thyroid disease   . Varicose veins     Past Surgical History:  Procedure Laterality Date  . ABDOMINAL HYSTERECTOMY     fibroids non cancer   . CATARACT EXTRACTION     Bilateral  . cataract surgery Right 03-02-2012  . CHOLECYSTECTOMY    . TONSILLECTOMY      Family History  Problem Relation Age of Onset  . Cancer Mother        CERVICAL uterin cancer   . Liver disease Mother        from chronic heaptitis   . Diabetes Mother   . Heart disease Father        avdisease avr  . Stroke Father   . Cancer Sister        Ovarian and endometrial   . Cancer Maternal Aunt        Colon  . Heart disease Maternal Grandfather   . Heart disease Paternal Grandmother   . Heart disease Paternal Grandfather   . Diabetes Sister   . Diabetes Brother     Social History   Tobacco Use  . Smoking status: Never Smoker  . Smokeless tobacco: Never Used  Substance Use Topics  . Alcohol use: No  . Drug use: No      Current Outpatient Medications:  .  acetaminophen (TYLENOL) 325 MG tablet, Take 650 mg by mouth every 6 (six) hours as needed for mild pain., Disp: , Rfl:  .  chlorthalidone (HYGROTON) 25 MG tablet, Take 1 tablet (25 mg total) by mouth daily., Disp: 90 tablet, Rfl: 2 .  doxycycline (VIBRA-TABS) 100 MG tablet, Take 1 tablet (100 mg total) by mouth 2 (two) times daily., Disp: 14 tablet, Rfl: 0 .  gabapentin (NEURONTIN) 300 MG capsule, Take 300 mg by mouth every evening., Disp: , Rfl:  .  Multiple Vitamin (MULTIVITAMIN WITH MINERALS) TABS tablet, Take 1 tablet by mouth daily. Women's formula, Disp: , Rfl:   EXAM:  BP Readings from Last 3 Encounters:  02/19/18 130/86  11/13/17 120/82  08/01/17 (!) 152/102    VITALS per patient if applicable: GENERAL: alert, oriented, appears well and in no acute distress HEENT: atraumatic, conjunttiva clear, no obvious abnormalities on inspection of external nose and ears NECK: normal movements of the head and neck  Points to a small swelling left ac jaw  Area on left  No redness   Skin left cheeck is improved see scanned pix from my chart  No facila edema LUNGS: on inspection no signs of respiratory distress, breathing rate appears normal, no obvious gross SOB, gasping or wheezing CV: no obvious cyanosis MS: moves all visible extremities without noticeable abnormality  PSYCH/NEURO: pleasant and cooperative, no obvious depression or anxiety, speech and thought processing grossly intact   ASSESSMENT AND PLAN:  Discussed the following assessment and plan:    ICD-10-CM   1. Rash  R21    left  cheek  2. Swollen gland  R59.9    left prob reactive aden ? no systemic sx  rash looked more like tinea but could have been bacterial do not think from ebv from 10 years ago    Diff dx   Reviewed  The cheek  rash really looked like tinea   early and is resolving with lotrimin ( or time)   Will cover for secondary bacteria infection causing reactive swollen gland doxy for 7 days ( Allergic to pcn sulfa and cipro) Counseled. For alarm sx and if not better in a week   Not felt related to remote hx of  EBV mono  Will follow  Keep using the lotrimin  For now and after till better . ( in case  Is tinea resolving)   Expectant management and discussion of plan and treatment with opportunity to ask questions and all were answered. The patient agreed with the plan and demonstrated an understanding of the instructions.   Advised to call back or seek an in-person evaluation if worsening  or having  further concerns . Shanon Ace, MD

## 2018-09-14 ENCOUNTER — Ambulatory Visit: Payer: Federal, State, Local not specified - PPO

## 2018-10-10 DIAGNOSIS — R635 Abnormal weight gain: Secondary | ICD-10-CM | POA: Diagnosis not present

## 2018-10-29 ENCOUNTER — Telehealth (INDEPENDENT_AMBULATORY_CARE_PROVIDER_SITE_OTHER): Payer: Federal, State, Local not specified - PPO | Admitting: Internal Medicine

## 2018-10-29 ENCOUNTER — Encounter: Payer: Self-pay | Admitting: Internal Medicine

## 2018-10-29 ENCOUNTER — Other Ambulatory Visit: Payer: Self-pay

## 2018-10-29 DIAGNOSIS — I10 Essential (primary) hypertension: Secondary | ICD-10-CM

## 2018-10-29 DIAGNOSIS — Z8701 Personal history of pneumonia (recurrent): Secondary | ICD-10-CM

## 2018-10-29 NOTE — Progress Notes (Signed)
Virtual Visit via Video Note  I connected with@ on 10/29/18 at  3:15 PM EDT by a video enabled telemedicine application and verified that I am speaking with the correct person using two identifiers. Location patient: home Location provider:work office Persons participating in the virtual visit: patient, provider  [employer] national recommendations  regarding COVID 19 pandemic   video visit is advised over in office visit for this patient.  Patient aware  of the limitations of evaluation and management by telemedicine and  availability of in person appointments. and agreed to proceed.   HPI: Alicia [name] presents for video visit. 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  Students to be in 12  At a time and teachers move to different classrooms .   Asks about pna shot  She has had pna x 2     ROS: See pertinent positives and negatives per HPI.  Past Medical History:  Diagnosis Date  . Allergy   . Chronic tension headaches   . Hx of varicella   . Hypertension   . Kidney stones   . Near syncope   . Nephrolithiasis    history of  passed on own.   . Palpitations   . Thyroid disease   . Varicose veins     Past Surgical History:  Procedure Laterality Date  . ABDOMINAL HYSTERECTOMY     fibroids non cancer   . CATARACT EXTRACTION     Bilateral  . cataract surgery Right 03-02-2012  . CHOLECYSTECTOMY    . TONSILLECTOMY      Family History  Problem Relation Age of Onset  . Cancer Mother        CERVICAL uterin cancer   . Liver disease Mother        from chronic heaptitis   . Diabetes Mother   . Heart disease Father        avdisease avr  . Stroke Father   . Cancer Sister        Ovarian and endometrial  . Cancer Maternal Aunt        Colon  . Heart disease Maternal Grandfather   . Heart disease Paternal Grandmother   . Heart disease Paternal Grandfather    . Diabetes Sister   . Diabetes Brother     Social History   Tobacco Use  . Smoking status: Never Smoker  . Smokeless tobacco: Never Used  Substance Use Topics  . Alcohol use: No  . Drug use: No      Current Outpatient Medications:  .  acetaminophen (TYLENOL) 325 MG tablet, Take 650 mg by mouth every 6 (six) hours as needed for mild pain., Disp: , Rfl:  .  chlorthalidone (HYGROTON) 25 MG tablet, Take 1 tablet (25 mg total) by mouth daily., Disp: 90 tablet, Rfl: 2 .  doxycycline (VIBRA-TABS) 100 MG tablet, Take 1 tablet (100 mg total) by mouth 2 (two) times daily., Disp: 14 tablet, Rfl: 0 .  gabapentin (NEURONTIN) 300 MG capsule, Take 300 mg by mouth every evening., Disp: , Rfl:  .  Multiple Vitamin (MULTIVITAMIN WITH MINERALS) TABS tablet, Take 1 tablet by mouth daily. Women's formula, Disp: , Rfl:   EXAM: BP Readings from Last 3 Encounters:  02/19/18 130/86  11/13/17 120/82  08/01/17 (!) 152/102    VITALS per patient if applicable: GENERAL: alert, oriented,  appears well and in no acute distress HEENT: atraumatic, conjunttiva clear, no obvious abnormalities on inspection of external nose and ears NECK: normal movements of the head and neck LUNGS: on inspection no signs of respiratory distress, breathing rate appears normal, no obvious gross SOB, gasping or wheezing CV: no obvious cyanosis MS: moves all visible extremities without noticeable abnormality PSYCH/NEURO: pleasant and cooperative, no obvious depression or anxiety, speech and thought processing grossly intact Lab Results  Component Value Date   WBC 7.9 01/02/2017   HGB 14.6 08/01/2017   HCT 43.0 08/01/2017   PLT 171 01/02/2017   GLUCOSE 92 02/19/2018   CHOL 205 (H) 06/19/2017   TRIG 125 06/19/2017   HDL 65 06/19/2017   LDLCALC 115 (H) 06/19/2017   ALT 25 01/02/2017   AST 34 01/02/2017   NA 142 02/19/2018   K 3.8 02/19/2018   CL 103 02/19/2018   CREATININE 0.65 02/19/2018   BUN 20 02/19/2018   CO2 22  02/19/2018   TSH 2.92 07/17/2015   INR 1.0 ratio 04/03/2009   HGBA1C 5.5 07/17/2015    ASSESSMENT AND PLAN:  Discussed the following assessment and plan:    ICD-10-CM   1. Essential hypertension  I10   2. OBESITY, CLASS III  E66.01   3. History of pneumonia  Z87.01    Pt has risk  Ht hx pna and obesity weight   Letter about risk to be sent to my chart  Counseled.   Expectant management and discussion of plan and treatment with opportunity to ask questions and all were answered. The patient agreed with the plan and demonstrated an understanding of the instructions.  get flu vaccine and ok to get pneumovax 23    Advised to call back or seek an in-person evaluation if worsening  or having  further concerns . In interim     Shanon Ace, MD

## 2018-10-30 NOTE — Telephone Encounter (Signed)
See virtual visit  

## 2018-10-31 DIAGNOSIS — D485 Neoplasm of uncertain behavior of skin: Secondary | ICD-10-CM | POA: Diagnosis not present

## 2018-10-31 DIAGNOSIS — L7211 Pilar cyst: Secondary | ICD-10-CM | POA: Diagnosis not present

## 2018-10-31 DIAGNOSIS — L820 Inflamed seborrheic keratosis: Secondary | ICD-10-CM | POA: Diagnosis not present

## 2018-11-05 DIAGNOSIS — H04123 Dry eye syndrome of bilateral lacrimal glands: Secondary | ICD-10-CM | POA: Diagnosis not present

## 2018-12-07 ENCOUNTER — Ambulatory Visit: Payer: Federal, State, Local not specified - PPO | Admitting: Internal Medicine

## 2019-02-06 NOTE — Progress Notes (Signed)
Cardiology Office Note   Date:  02/08/2019   ID:  Alicia [name], DOB 07/16/1955, MRN [id number]  PCP:  Burnis Medin, MD  Cardiologist:   Dorris Carnes, MD   Pt presents for f/u of CP and HTN   History of Present Illness: Alicia [name] is a 64 y.o. 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   Starting to walk regularly.    Wants to get wt down Does have some SOB with activity Attributes to wt.    Current Outpatient Medications  Medication Sig Dispense Refill  . acetaminophen (TYLENOL) 325 MG tablet Take 650 mg by mouth every 6 (six) hours as needed for mild pain.    . chlorthalidone (HYGROTON) 25 MG tablet Take 1 tablet (25 mg total) by mouth daily. 90 tablet 2  . Multiple Vitamin (MULTIVITAMIN WITH MINERALS) TABS tablet Take 1 tablet by mouth daily. Women's formula     No current facility-administered medications for this visit.    Allergies:   Ciprofloxacin hcl, Iodinated diagnostic agents, Iohexol, Lidocaine hcl, Morphine, Sulfa antibiotics, Prednisone, Valsartan, Shellfish allergy, Ciprofloxacin, Penicillins, Sulfamethoxazole-trimethoprim, and Sulfonamide derivatives   Past Medical History:  Diagnosis Date  . Allergy   . Chronic tension headaches   . Hx of varicella   . Hypertension   . Kidney stones   . Near syncope   . Nephrolithiasis    history of  passed on own.   . Palpitations   . Thyroid disease   . Varicose veins     Past Surgical History:  Procedure Laterality Date  . ABDOMINAL HYSTERECTOMY     fibroids non cancer   . CATARACT EXTRACTION     Bilateral  . cataract surgery Right 03-02-2012  .  CHOLECYSTECTOMY    . TONSILLECTOMY       Social History:  The patient  reports that she has never smoked. She has never used smokeless tobacco. She reports that she does not drink alcohol or use drugs.   Family History:  The patient's family history includes Cancer in her maternal aunt, mother, and sister; Diabetes in her brother, mother, and sister; Heart disease in her father, maternal grandfather, paternal grandfather, and paternal grandmother; Liver disease in her mother; Stroke in her father.    ROS:  Please see the history of present illness. All other systems are reviewed and  Negative to the above problem except as noted.    PHYSICAL EXAM: VS:  BP (!) 154/80   Pulse 67   Ht 5\' 6"  (1.676 m)   Wt 265 lb 12.8 oz (120.6 kg)   SpO2 98%   BMI 42.90 kg/m   GEN:  Morbidly obese 64 yo  in no acute distress  HEENT: normal  Neck:  JVP is normal    No  carotid bruits, Cardiac: RRR; No signif murmur  No  , rubs, or gallops, No signif LE edema  Respiratory:  clear to auscultation bilaterally, normal work of breathing GI: soft, nontender, nondistended, + BS  No hepatomegaly  MS: no deformity Moving all  extremities   Skin: warm and dry, no rash  EKG:  EKG is  not ordered today.     Lipid Panel    Component Value Date/Time   CHOL 205 (H) 06/19/2017 1643   TRIG 125 06/19/2017 1643   HDL 65 06/19/2017 1643   CHOLHDL 3.2 06/19/2017 1643   CHOLHDL 3 07/17/2015 1500   VLDL 27.8 07/17/2015 1500   LDLCALC 115 (H) 06/19/2017 1643      Wt Readings from Last 3 Encounters:  02/08/19 265 lb 12.8 oz (120.6 kg)  02/19/18 256 lb 3.2 oz (116.2 kg)  11/13/17 257 lb 8 oz (116.8 kg)      ASSESSMENT AND PLAN:  1  HTN BP is elevated  Will add Toprol XL to regimen  25 mg   Continue chlorthalidone    I have asked pt to write in in about 4 wks with BP respone   Check labs in a few wks  2  HCM    Encouraged her to increase activity as she plans  Get wt down Will set up for fasting lipoids ,  Hgb A1 C, Vit D in a few wks     3   CP   Pt denies siginif symptoms    F/U in the fall    Current medicines are reviewed at length with the patient today.  The patient does not have concerns regarding medicines.  F/U with me as needed if symptoms change   Signed, Dorris Carnes, MD  02/08/2019 4:51 PM    [facility] [place], [place],   [address] Phone: [phone]; Fax: [phone]

## 2019-02-08 ENCOUNTER — Ambulatory Visit: Payer: Federal, State, Local not specified - PPO | Admitting: Internal Medicine

## 2019-02-08 ENCOUNTER — Encounter: Payer: Self-pay | Admitting: Internal Medicine

## 2019-02-08 ENCOUNTER — Other Ambulatory Visit: Payer: Self-pay

## 2019-02-08 VITALS — BP 154/80 | HR 67 | Ht 66.0 in | Wt 265.8 lb

## 2019-02-08 DIAGNOSIS — E079 Disorder of thyroid, unspecified: Secondary | ICD-10-CM

## 2019-02-08 DIAGNOSIS — I10 Essential (primary) hypertension: Secondary | ICD-10-CM

## 2019-02-08 MED ORDER — METOPROLOL SUCCINATE ER 25 MG PO TB24: 25.0000 mg | ORAL_TABLET | Freq: Every day | ORAL | 3 refills | Status: DC

## 2019-02-08 MED ORDER — CHLORTHALIDONE 25 MG PO TABS: 25.0000 mg | ORAL_TABLET | Freq: Every day | ORAL | 3 refills | Status: DC

## 2019-02-08 NOTE — Patient Instructions (Signed)
Medication Instructions:  Your physician has recommended you make the following change in your medication:  1.) start metoprolol succinate (Toprol XL) 25 mg once a day for blood pressure  *If you need a refill on your cardiac medications before your next appointment, please call your pharmacy*  Lab Work: Return for fasting labs (lipids, hgA1c, cbc, tsh, bmet, vit d  Testing/Procedures: none  Follow-Up: At [facility], you and your health needs are our priority.  As part of our continuing mission to provide you with exceptional heart care, we have created designated Provider Care Teams.  These Care Teams include your primary Cardiologist (physician) and Advanced Practice Providers (APPs -  Physician Assistants and Nurse Practitioners) who all work together to provide you with the care you need, when you need it.  Your next appointment:   10 month(s)  The format for your next appointment:   Either In Person or Virtual  Provider:   You may see Dorris Carnes, MD or one of the following Advanced Practice Providers on your designated Care Team:    Richardson Dopp, PA-C  [place], [place]  Daune Perch, NP   Other Instructions Keep track of blood pressure/heart rate and send list of readings or drop of a list to Dr. Harrington Challenger in a few weeks.

## 2019-02-15 ENCOUNTER — Other Ambulatory Visit: Payer: Federal, State, Local not specified - PPO | Admitting: *Deleted

## 2019-02-15 ENCOUNTER — Other Ambulatory Visit: Payer: Self-pay

## 2019-02-15 DIAGNOSIS — I10 Essential (primary) hypertension: Secondary | ICD-10-CM | POA: Diagnosis not present

## 2019-02-15 DIAGNOSIS — Z1321 Encounter for screening for nutritional disorder: Secondary | ICD-10-CM | POA: Diagnosis not present

## 2019-02-15 DIAGNOSIS — E079 Disorder of thyroid, unspecified: Secondary | ICD-10-CM | POA: Diagnosis not present

## 2019-02-16 LAB — LIPID PANEL
Chol/HDL Ratio: 2.7 ratio (ref 0.0–4.4)
Cholesterol, Total: 191 mg/dL (ref 100–199)
HDL: 72 mg/dL (ref >39)
LDL Chol Calc (NIH): 103 mg/dL — ABNORMAL HIGH (ref 0–99)
Triglycerides: 92 mg/dL (ref 0–149)
VLDL Cholesterol Cal: 16 mg/dL (ref 5–40)

## 2019-02-16 LAB — CBC
Hematocrit: 39.8 % (ref 34.0–46.6)
Hemoglobin: 14.2 g/dL (ref 11.1–15.9)
MCH: 32.1 pg (ref 26.6–33.0)
MCHC: 35.7 g/dL (ref 31.5–35.7)
MCV: 90 fL (ref 79–97)
Platelets: 235 10*3/uL (ref 150–450)
RBC: 4.42 x10E6/uL (ref 3.77–5.28)
RDW: 11.9 % (ref 11.7–15.4)
WBC: 6.3 10*3/uL (ref 3.4–10.8)

## 2019-02-16 LAB — BASIC METABOLIC PANEL
BUN/Creatinine Ratio: 21 (ref 12–28)
BUN: 14 mg/dL (ref 8–27)
CO2: 23 mmol/L (ref 20–29)
Calcium: 9.1 mg/dL (ref 8.7–10.3)
Chloride: 102 mmol/L (ref 96–106)
Creatinine, Ser: 0.67 mg/dL (ref 0.57–1.00)
GFR calc Af Amer: 109 mL/min/{1.73_m2} (ref >59)
GFR calc non Af Amer: 95 mL/min/{1.73_m2} (ref >59)
Glucose: 109 mg/dL — ABNORMAL HIGH (ref 65–99)
Potassium: 3.8 mmol/L (ref 3.5–5.2)
Sodium: 142 mmol/L (ref 134–144)

## 2019-02-16 LAB — VITAMIN D 25 HYDROXY (VIT D DEFICIENCY, FRACTURES): Vit D, 25-Hydroxy: 26.7 ng/mL — ABNORMAL LOW (ref 30.0–100.0)

## 2019-02-16 LAB — TSH: TSH: 3.34 u[IU]/mL (ref 0.450–4.500)

## 2019-02-16 LAB — HEMOGLOBIN A1C
Est. average glucose Bld gHb Est-mCnc: 131 mg/dL
Hgb A1c MFr Bld: 6.2 % — ABNORMAL HIGH (ref 4.8–5.6)

## 2019-03-16 ENCOUNTER — Ambulatory Visit: Payer: Federal, State, Local not specified - PPO | Attending: Internal Medicine

## 2019-03-16 DIAGNOSIS — Z23 Encounter for immunization: Secondary | ICD-10-CM | POA: Insufficient documentation

## 2019-03-16 NOTE — Progress Notes (Signed)
   Covid-19 Vaccination Clinic  Name:  Alicia [name]    MRN: [id number] DOB: 05-02-1955  03/16/2019  [name] was observed post Covid-19 immunization for 30 minutes based on pre-vaccination screening without incidence. She was provided with Vaccine Information Sheet and instruction to access the V-Safe system.   [name] was instructed to call 911 with any severe reactions post vaccine: Marland Kitchen Difficulty breathing  . Swelling of your face and throat  . A fast heartbeat  . A bad rash all over your body  . Dizziness and weakness    Immunizations Administered    Name Date Dose VIS Date Route   Pfizer COVID-19 Vaccine 03/16/2019  5:31 PM 0.3 mL 12/28/2018 Intramuscular   Manufacturer: [place]   Lot: [id number]   [place]: [id number]

## 2019-04-06 ENCOUNTER — Ambulatory Visit: Payer: Federal, State, Local not specified - PPO | Attending: Internal Medicine

## 2019-04-06 DIAGNOSIS — Z23 Encounter for immunization: Secondary | ICD-10-CM

## 2019-04-06 NOTE — Progress Notes (Signed)
   Covid-19 Vaccination Clinic  Name:  Alicia [name]    MRN: [id number] DOB: 10-Mar-1955  04/06/2019  Alicia [name] was observed post Covid-19 immunization for 30 minutes based on pre-vaccination screening without incident. She was provided with Vaccine Information Sheet and instruction to access the V-Safe system.   Alicia [name] was instructed to call 911 with any severe reactions post vaccine: Marland Kitchen Difficulty breathing  . Swelling of face and throat  . A fast heartbeat  . A bad rash all over body  . Dizziness and weakness   Immunizations Administered    Name Date Dose VIS Date Route   Pfizer COVID-19 Vaccine 04/06/2019  4:56 PM 0.3 mL 12/28/2018 Intramuscular   Manufacturer: [place]   Lot: [id number]   [place]: [id number]

## 2019-04-22 NOTE — Telephone Encounter (Signed)
Make her a virtual appt  To discuss so we can make appropriate referral .

## 2019-04-29 ENCOUNTER — Encounter: Payer: Self-pay | Admitting: Internal Medicine

## 2019-04-29 ENCOUNTER — Other Ambulatory Visit: Payer: Self-pay

## 2019-04-29 ENCOUNTER — Telehealth (INDEPENDENT_AMBULATORY_CARE_PROVIDER_SITE_OTHER): Payer: Federal, State, Local not specified - PPO | Admitting: Internal Medicine

## 2019-04-29 VITALS — BP 140/78 | Temp 98.5°F | Ht 66.0 in | Wt 255.0 lb

## 2019-04-29 DIAGNOSIS — K625 Hemorrhage of anus and rectum: Secondary | ICD-10-CM

## 2019-04-29 NOTE — Progress Notes (Signed)
Virtual Visit via Video Note  I connected with@ on 04/29/19 at  4:00 PM EDT by a video enabled telemedicine application and verified that I am speaking with the correct person using two identifiers. Location patient: home Location provider:work  office Persons participating in the virtual visit: patient, provider  [place] national recommendations  regarding COVID 19 pandemic   video visit is advised over in office visit for this patient.  Patient aware  of the limitations of evaluation and management by telemedicine and  availability of in person appointments. and agreed to proceed.   HPI: Alicia [name] presents for video visit need ing referral  Gi doc dr Amedeo Plenty is no  Longer available  and needs new Gi provider  ;ast colon about 3 years ago  Remote hx of hemorrhoids but not like this. Now having daily bleeding and having  To wear pads also . No other bleeding fever abd pain  Or asa use .  Using local otc  Topicals only.   ROS: See pertinent positives and negatives per HPI. No cv sx  Moving plans  to [place] in the next year  Past Medical History:  Diagnosis Date  . Allergy   . Chronic tension headaches   . Hx of varicella   . Hypertension   . Kidney stones   . Near syncope   . Nephrolithiasis    history of  passed on own.   . Palpitations   . Thyroid disease   . Varicose veins     Past Surgical History:  Procedure Laterality Date  . ABDOMINAL HYSTERECTOMY     fibroids non cancer   . CATARACT EXTRACTION     Bilateral  . cataract surgery Right 03-02-2012  . CHOLECYSTECTOMY    . TONSILLECTOMY      Family History  Problem Relation Age of Onset  . Cancer Mother        CERVICAL uterin cancer   . Liver disease Mother        from chronic heaptitis   . Diabetes Mother   . Heart disease Father        avdisease avr  . Stroke Father   . Cancer Sister        Ovarian and endometrial  . Cancer Maternal Aunt        Colon  . Heart disease Maternal Grandfather    . Heart disease Paternal Grandmother   . Heart disease Paternal Grandfather   . Diabetes Sister   . Diabetes Brother     Social History   Tobacco Use  . Smoking status: Never Smoker  . Smokeless tobacco: Never Used  Substance Use Topics  . Alcohol use: No  . Drug use: No      Current Outpatient Medications:  .  acetaminophen (TYLENOL) 325 MG tablet, Take 650 mg by mouth every 6 (six) hours as needed for mild pain., Disp: , Rfl:  .  chlorthalidone (HYGROTON) 25 MG tablet, Take 1 tablet (25 mg total) by mouth daily., Disp: 90 tablet, Rfl: 3 .  Multiple Vitamin (MULTIVITAMIN WITH MINERALS) TABS tablet, Take 1 tablet by mouth daily. Women's formula, Disp: , Rfl:  .  metoprolol succinate (TOPROL XL) 25 MG 24 hr tablet, Take 1 tablet (25 mg total) by mouth daily. (Patient not taking: Reported on 04/29/2019), Disp: 90 tablet, Rfl: 3  EXAM: BP Readings from Last 3 Encounters:  04/29/19 140/78  02/08/19 (!) 154/80  02/19/18 130/86    VITALS per patient if applicable:  GENERAL: alert, oriented, appears well and in no acute distress  HEENT: atraumatic, conjunttiva clear, no obvious abnormalities on inspection of external nose and ears  NECK: normal movements of the head and neck  LUNGS: on inspection no signs of respiratory distress, breathing rate appears normal, no obvious gross SOB, gasping or wheezing  CV: no obvious cyanosis  MS: moves all visible extremities without noticeable abnormality  PSYCH/NEURO: pleasant and cooperative, no obvious depression or anxiety, speech and thought processing grossly intact Lab Results  Component Value Date   WBC 6.3 02/15/2019   HGB 14.2 02/15/2019   HCT 39.8 02/15/2019   PLT 235 02/15/2019   GLUCOSE 109 (H) 02/15/2019   CHOL 191 02/15/2019   TRIG 92 02/15/2019   HDL 72 02/15/2019   LDLCALC 103 (H) 02/15/2019   ALT 25 01/02/2017   AST 34 01/02/2017   NA 142 02/15/2019   K 3.8 02/15/2019   CL 102 02/15/2019   CREATININE 0.67  02/15/2019   BUN 14 02/15/2019   CO2 23 02/15/2019   TSH 3.340 02/15/2019   INR 1.0 ratio 04/03/2009   HGBA1C 6.2 (H) 02/15/2019    ASSESSMENT AND PLAN:  Discussed the following assessment and plan:    ICD-10-CM   1. Rectal bleeding  K62.5    Increase frequency  prev doc retired dr Amedeo Plenty     Last colon utd  Hx of hemorrhoids  Counseled.  Refer   Expectant management and discussion of plan and treatment with opportunity to ask questions and all were answered. The patient agreed with the plan and demonstrated an understanding of the instructions.   Advised to call back or seek an in-person evaluation if worsening  or having  further concerns . In interim for emergent sx  Return if symptoms worsen before referral.    Shanon Ace, MD

## 2019-04-30 ENCOUNTER — Encounter: Payer: Self-pay | Admitting: Gastroenterology

## 2019-05-13 ENCOUNTER — Other Ambulatory Visit: Payer: Self-pay

## 2019-05-13 ENCOUNTER — Encounter: Payer: Self-pay | Admitting: Podiatry

## 2019-05-13 ENCOUNTER — Other Ambulatory Visit: Payer: Self-pay | Admitting: Podiatry

## 2019-05-13 ENCOUNTER — Ambulatory Visit (INDEPENDENT_AMBULATORY_CARE_PROVIDER_SITE_OTHER): Payer: Federal, State, Local not specified - PPO

## 2019-05-13 ENCOUNTER — Ambulatory Visit: Payer: Federal, State, Local not specified - PPO | Admitting: Podiatry

## 2019-05-13 VITALS — BP 151/85 | HR 68 | Temp 98.2°F | Resp 16

## 2019-05-13 DIAGNOSIS — M6740 Ganglion, unspecified site: Secondary | ICD-10-CM | POA: Diagnosis not present

## 2019-05-13 DIAGNOSIS — M67472 Ganglion, left ankle and foot: Secondary | ICD-10-CM | POA: Diagnosis not present

## 2019-05-13 DIAGNOSIS — M79672 Pain in left foot: Secondary | ICD-10-CM

## 2019-05-13 NOTE — Progress Notes (Signed)
   Subjective:    Patient ID: Alicia [name], female    DOB: 1955/03/29, 64 y.o.   MRN: [id number]  HPI    Review of Systems  All other systems reviewed and are negative.      Objective:   Physical Exam        Assessment & Plan:

## 2019-05-16 DIAGNOSIS — R29810 Facial weakness: Secondary | ICD-10-CM | POA: Diagnosis not present

## 2019-05-16 DIAGNOSIS — R110 Nausea: Secondary | ICD-10-CM | POA: Diagnosis not present

## 2019-05-16 DIAGNOSIS — R42 Dizziness and giddiness: Secondary | ICD-10-CM | POA: Diagnosis not present

## 2019-05-16 DIAGNOSIS — G459 Transient cerebral ischemic attack, unspecified: Secondary | ICD-10-CM | POA: Diagnosis not present

## 2019-05-17 ENCOUNTER — Encounter: Payer: Self-pay | Admitting: Internal Medicine

## 2019-05-17 ENCOUNTER — Other Ambulatory Visit: Payer: Self-pay

## 2019-05-17 ENCOUNTER — Telehealth: Payer: Self-pay | Admitting: Internal Medicine

## 2019-05-17 ENCOUNTER — Telehealth (INDEPENDENT_AMBULATORY_CARE_PROVIDER_SITE_OTHER): Payer: Federal, State, Local not specified - PPO | Admitting: Internal Medicine

## 2019-05-17 VITALS — BP 145/85 | HR 63 | Temp 97.0°F | Ht 66.0 in | Wt 250.0 lb

## 2019-05-17 DIAGNOSIS — Z8719 Personal history of other diseases of the digestive system: Secondary | ICD-10-CM | POA: Diagnosis not present

## 2019-05-17 DIAGNOSIS — R55 Syncope and collapse: Secondary | ICD-10-CM | POA: Diagnosis not present

## 2019-05-17 DIAGNOSIS — R109 Unspecified abdominal pain: Secondary | ICD-10-CM | POA: Diagnosis not present

## 2019-05-17 NOTE — Progress Notes (Signed)
Subjective:   Patient ID: Alicia [name], female   DOB: 64 y.o.   MRN: [id number]   HPI Patient presents stating that she has had quite a bit of irritation in the outside of her left foot and she is not sure if it is an injury or what it may be.  States is been present for at least 4 months and is more bothersome when she wears tighter shoes.  Patient does not smoke likes to be active   Review of Systems  All other systems reviewed and are negative.       Objective:  Physical Exam Vitals and nursing note reviewed.  Constitutional:      Appearance: She is well-developed.  Pulmonary:     Effort: Pulmonary effort is normal.  Musculoskeletal:        General: Normal range of motion.  Skin:    General: Skin is warm.  Neurological:     Mental Status: She is alert.     Neurovascular status intact muscle strength adequate range of motion was within normal limits.  Patient is found to have a small area on the lateral side of the left foot with no erythema no edema associated with it that is mildly tender when pressed but is small at about 5 x 5 mm.  I did not note freely movable area within subcutaneous tissue.  Patient has good digital perfusion well oriented x3     Assessment:  Appears to be more of a cystic area versus a true ganglion on and at this point I educated her on there took x-rays precaution and went ahead and did a careful steroid injection to try to shrink and reduce the inflammation along with compression and instructions for home moist heat therapy.  Patient will be seen back for [place] to recheck     Plan:  H&P was done and above procedure performed which was tolerated well.  Patient will be seen back on an as-needed basis and ultimately may require excision  X-ray was negative for signs of calcification or bony injury associated with this area

## 2019-05-17 NOTE — Progress Notes (Signed)
Virtual Visit via Video Note  I connected with@ on 05/17/19 at  2:30 PM EDT by a video enabled telemedicine application and verified that I am speaking with the correct person using two identifiers. Location patient: home Location provider:work  office Persons participating in the virtual visit: patient, provider  [place] national recommendations  regarding COVID 19 pandemic   video visit is advised over in office visit for this patient.  Patient aware  of the limitations of evaluation and management by telemedicine and  availability of in person appointments. and agreed to proceed.   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 No hx of syncope x when had ed visit for some Gi illness and hydration issues in past  No cp sob rapid heart rate palpitation  Pre sybncop Past Medical History:  Diagnosis Date  . Allergy   . Chronic tension headaches   . Hx of varicella   . Hypertension   . Kidney stones   . Near syncope   . Nephrolithiasis    history of  passed on own.   . Palpitations   . Thyroid disease   . Tubular adenoma of colon 02/1997   w/ HGD  . Varicose veins     Past Surgical History:  Procedure Laterality Date  . ABDOMINAL HYSTERECTOMY     fibroids non cancer   . CATARACT EXTRACTION     Bilateral  . cataract  surgery Right 03-02-2012  . CHOLECYSTECTOMY    . TONSILLECTOMY      Family History  Problem Relation Age of Onset  . Cancer Mother        CERVICAL uterin cancer   . Liver disease Mother        from chronic heaptitis   . Diabetes Mother   . Heart disease Father        avdisease avr  . Stroke Father   . Cancer Sister        Ovarian and endometrial  . Cancer Maternal Aunt        Colon  . Heart disease Maternal Grandfather   . Heart disease Paternal Grandmother   . Heart disease Paternal Grandfather   . Diabetes Sister   . Diabetes Brother     Social History   Tobacco Use  . Smoking status: Never Smoker  . Smokeless tobacco: Never Used  Substance Use Topics  . Alcohol use: No  . Drug use: No      Current Outpatient Medications:  .  acetaminophen (TYLENOL) 325 MG tablet, Take 650 mg by mouth every 6 (six) hours as needed for mild pain., Disp: , Rfl:  .  chlorthalidone (HYGROTON) 25 MG tablet, Take 1 tablet (25 mg total) by mouth daily., Disp: 90 tablet, Rfl: 3 .  metoprolol succinate (TOPROL XL) 25 MG 24 hr tablet, Take 1 tablet (25 mg total) by mouth daily. (Patient taking differently: Take 25 mg by mouth daily. 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  Multiple Vitamin (MULTIVITAMIN WITH MINERALS) TABS tablet, Take 1 tablet by mouth daily. Women's formula, Disp: , Rfl:   EXAM: BP Readings from Last 3 Encounters:  05/17/19 (!) 145/85  05/13/19 (!) 151/85  04/29/19 140/78    VITALS per patient if applicable:  GENERAL: alert, oriented, appears well and in no acute distress  HEENT: atraumatic, conjunttiva clear, no obvious abnormalities on inspection of external nose and ears  NECK: normal movements of the head and neck  LUNGS: on inspection no signs of respiratory distress, breathing rate appears normal, no obvious gross SOB, gasping or wheezing  CV: no obvious  cyanosis  MS: moves all visible extremities without noticeable abnormality  PSYCH/NEURO: pleasant and cooperative, no obvious depression or anxiety, speech and thought processing grossly intact Lab Results  Component Value Date   WBC 6.3 02/15/2019   HGB 14.2 02/15/2019   HCT 39.8 02/15/2019   PLT 235 02/15/2019   GLUCOSE 109 (H) 02/15/2019   CHOL 191 02/15/2019   TRIG 92 02/15/2019   HDL 72 02/15/2019   LDLCALC 103 (H) 02/15/2019   ALT 25 01/02/2017   AST 34 01/02/2017   NA 142 02/15/2019   K 3.8 02/15/2019   CL 102 02/15/2019   CREATININE 0.67 02/15/2019   BUN 14 02/15/2019   CO2 23 02/15/2019   TSH 3.340 02/15/2019   INR 1.0 ratio 04/03/2009   HGBA1C 6.2 (H) 02/15/2019  echo 2018  Dr Harrington Challenger  ASSESSMENT AND PLAN:  Discussed the following assessment and plan:    ICD-10-CM   1. Vasovagal near syncope  R55 CBC with Differential/Platelet    Urinalysis, Routine w reflex microscopic    C-reactive protein  2. Abdominal pain, unspecified abdominal location  R10.9 CBC with Differential/Platelet    Urinalysis, Routine w reflex microscopic    C-reactive protein  3. History of rectal bleeding  Z87.19 CBC with Differential/Platelet    Urinalysis, Routine w reflex microscopic    C-reactive protein   Agree  Near syncope prob vv form gi sx    Fell approach to check out gi  Issues  With hx of bleeding some in past Had gi appt May 11  Counseled.  Get  Updated cbc ua  And in person  Check with me   In ful  Stay hydrated and fu if happens again  bp was up some wil address at in person if still up   Expectant management and discussion of plan and treatment with opportunity to ask questions and all were answered. The patient agreed with the plan and demonstrated an understanding of the instructions.   Advised to call back or seek an in-person evaluation if worsening  or having  further concerns . Return for in person fu may 11 and lab next week .    Shanon Ace, MD

## 2019-05-17 NOTE — Telephone Encounter (Signed)
Pt call and want a call back she stated her husband call ems for her yesterday and they check her for a Vaso Vagal and told her to follow up with her dr.pt want a call .

## 2019-05-17 NOTE — Telephone Encounter (Signed)
I have called the patient and scheduled her for a MyChart video visit at 2:30pm today. Patient verbalized an understanding and knows to wait for you to join.

## 2019-05-20 ENCOUNTER — Other Ambulatory Visit (INDEPENDENT_AMBULATORY_CARE_PROVIDER_SITE_OTHER): Payer: Federal, State, Local not specified - PPO

## 2019-05-20 DIAGNOSIS — R109 Unspecified abdominal pain: Secondary | ICD-10-CM

## 2019-05-20 DIAGNOSIS — Z8719 Personal history of other diseases of the digestive system: Secondary | ICD-10-CM

## 2019-05-20 DIAGNOSIS — R55 Syncope and collapse: Secondary | ICD-10-CM | POA: Diagnosis not present

## 2019-05-20 LAB — CBC WITH DIFFERENTIAL/PLATELET
Basophils Absolute: 0.1 10*3/uL (ref 0.0–0.1)
Basophils Relative: 1 % (ref 0.0–3.0)
Eosinophils Absolute: 0.2 10*3/uL (ref 0.0–0.7)
Eosinophils Relative: 3 % (ref 0.0–5.0)
HCT: 40 % (ref 36.0–46.0)
Hemoglobin: 13.7 g/dL (ref 12.0–15.0)
Lymphocytes Relative: 26.3 % (ref 12.0–46.0)
Lymphs Abs: 1.9 10*3/uL (ref 0.7–4.0)
MCHC: 34.2 g/dL (ref 30.0–36.0)
MCV: 91.9 fl (ref 78.0–100.0)
Monocytes Absolute: 0.6 10*3/uL (ref 0.1–1.0)
Monocytes Relative: 8.7 % (ref 3.0–12.0)
Neutro Abs: 4.3 10*3/uL (ref 1.4–7.7)
Neutrophils Relative %: 61 % (ref 43.0–77.0)
Platelets: 225 10*3/uL (ref 150.0–400.0)
RBC: 4.35 Mil/uL (ref 3.87–5.11)
RDW: 12.7 % (ref 11.5–15.5)
WBC: 7.1 10*3/uL (ref 4.0–10.5)

## 2019-05-20 LAB — URINALYSIS, ROUTINE W REFLEX MICROSCOPIC
Hgb urine dipstick: NEGATIVE
Ketones, ur: NEGATIVE
Leukocytes,Ua: NEGATIVE
Nitrite: NEGATIVE
Specific Gravity, Urine: >=1.03 — AB (ref 1.000–1.030)
Total Protein, Urine: NEGATIVE
Urine Glucose: NEGATIVE
Urobilinogen, UA: 0.2 (ref 0.0–1.0)
pH: 5.5 (ref 5.0–8.0)

## 2019-05-20 LAB — C-REACTIVE PROTEIN: CRP: <1 mg/dL (ref 0.5–20.0)

## 2019-05-21 NOTE — Progress Notes (Signed)
No anemia  inflammation marker is  not elevated  and urine not  infected

## 2019-05-27 ENCOUNTER — Other Ambulatory Visit: Payer: Self-pay

## 2019-05-27 NOTE — Progress Notes (Signed)
Chief Complaint  Patient presents with   Follow-up    Doing better    HPI: Alicia [name] 64 y.o. come in for fu of near syncope  After abd pain and cramping   No further ussyues     No prodrome and feels much better   Still seeing  Gi about rectal bleeding  Hx   Has fam hx cad but no scd or arrythmia  Did have an echo a while back and was "ok"  Bp has been good  ROS: See pertinent positives and negatives per HPI.  Past Medical History:  Diagnosis Date   Allergy    Chronic tension headaches    Hx of varicella    Hypertension    Kidney stones    Near syncope    Nephrolithiasis    history of  passed on own.    Palpitations    Thyroid disease    Tubular adenoma of colon 02/1997   w/ HGD   Varicose veins     Family History  Problem Relation Age of Onset   Cancer Mother        CERVICAL uterin cancer    Liver disease Mother        from chronic heaptitis    Diabetes Mother    Heart disease Father        avdisease avr   Stroke Father    Prostate cancer Father    Cancer Sister        Ovarian and endometrial   Cancer Maternal Aunt        Colon   Heart disease Maternal Grandfather    Heart disease Paternal Grandmother    Heart disease Paternal Grandfather    Diabetes Sister    Diabetes Brother     Social History   Socioeconomic History   Marital status: Married    Spouse name: Not on file   Number of children: 3   Years of education: 16   Highest education level: Not on file  Occupational History   Occupation: Product manager: [employer]  Tobacco Use   Smoking status: Never Smoker   Smokeless tobacco: Never Used  Substance and Sexual Activity   Alcohol use: No   Drug use: No   Sexual activity: Yes    Partners: Male  Other Topics Concern   Not on file  Social History Narrative   [employer]- BA. 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 2 daughters- '82, '93, 1 son- '87. 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   Lives with her husband   2 dogs   orig from [employer]   Currently ob FMLA for dad awaiting valve surgery   Social Determinants of Radio broadcast assistant Strain:    Difficulty of Paying Living Expenses:   Food Insecurity:    Worried About Charity fundraiser in the Last Year:    Arboriculturist in the Last Year:   Transportation Needs:    Film/video editor (Medical):    Lack of Transportation (Non-Medical):   Physical Activity:    Days of Exercise per Week:    Minutes of Exercise per Session:   Stress:    Feeling of Stress :   Social Connections:    Frequency of Communication with Friends and Family:  Frequency of Social Gatherings with Friends and Family:    Attends Religious Services:    Active Member of Clubs or Organizations:    Attends Music therapist:    Marital Status:     Outpatient Medications Prior to Visit  Medication Sig Dispense Refill   acetaminophen (TYLENOL) 325 MG tablet Take 650 mg by mouth every 6 (six) hours as needed for mild pain.     chlorthalidone (HYGROTON) 25 MG tablet Take 1 tablet (25 mg total) by mouth daily. 90 tablet 3   metoprolol succinate (TOPROL XL) 25 MG 24 hr tablet Take 1 tablet (25 mg total) by mouth daily. (Patient not taking: Reported on 05/28/2019) 90 tablet 3   Multiple Vitamin (MULTIVITAMIN WITH MINERALS) TABS tablet Take 1 tablet by mouth daily. Women's formula     No facility-administered medications prior to visit.     EXAM:  BP 128/74    Pulse (!) 57    Temp 98.1 F (36.7 C) (Temporal)    Ht 5\' 6"  (1.676 m)    Wt 267 lb 9.6 oz (121.4 kg)    SpO2 98%    BMI 43.19 kg/m   Body mass index is 43.19 kg/m.  GENERAL: vitals reviewed and listed above, alert, oriented, appears well hydrated and in no acute distress HEENT: atraumatic, conjunctiva  clear, no  obvious abnormalities on inspection of external nose and ears OP : masked  NECK: no obvious masses on inspection palpation  LUNGS: clear to auscultation bilaterally, no wheezes, rales or rhonchi, good air movement CV: HRRR, no clubbing cyanosis or  peripheral edema nl cap refill  Abdomen:  Sof,t normal bowel sounds without hepatosplenomegaly, no guarding rebound or masses no CVA tenderness MS: moves all extremities without noticeable focal  abnormality PSYCH: pleasant and cooperative, no obvious depression or anxiety Lab Results  Component Value Date   WBC 7.1 05/20/2019   HGB 13.7 05/20/2019   HCT 40.0 05/20/2019   PLT 225.0 05/20/2019   GLUCOSE 109 (H) 02/15/2019   CHOL 191 02/15/2019   TRIG 92 02/15/2019   HDL 72 02/15/2019   LDLCALC 103 (H) 02/15/2019   ALT 25 01/02/2017   AST 34 01/02/2017   NA 142 02/15/2019   K 3.8 02/15/2019   CL 102 02/15/2019   CREATININE 0.67 02/15/2019   BUN 14 02/15/2019   CO2 23 02/15/2019   TSH 3.340 02/15/2019   INR 1.0 ratio 04/03/2009   HGBA1C 6.2 (H) 02/15/2019   BP Readings from Last 3 Encounters:  05/28/19 130/70  05/28/19 128/74  05/17/19 (!) 145/85   EKG  s r rate 56 ns stt wave but seems no change from prev EKGs  ASSESSMENT AND PLAN:  Discussed the following assessment and plan:  Near syncope - Plan: EKG 12-Lead, ECHOCARDIOGRAM COMPLETE  Family history of heart disease  Essential hypertension She has ht controlled   And fam hx but this really seems like a vagal gi related trigger  And no other alarm sx   Echo and if no sx follow  If concerns we can get cards involved again Will be seeing gi  -Patient advised to return or notify health care team  if  new concerns arise.  Patient Instructions  suspecting that your near faint was  A reaction to the Gi symptoms you had . EKG today  Non specific  Changes  Echo updated     Standley Brooking. Obinna Ehresman M.D.

## 2019-05-28 ENCOUNTER — Other Ambulatory Visit: Payer: Self-pay

## 2019-05-28 ENCOUNTER — Encounter: Payer: Self-pay | Admitting: Internal Medicine

## 2019-05-28 ENCOUNTER — Ambulatory Visit: Payer: Federal, State, Local not specified - PPO | Admitting: Gastroenterology

## 2019-05-28 ENCOUNTER — Ambulatory Visit (INDEPENDENT_AMBULATORY_CARE_PROVIDER_SITE_OTHER): Payer: Federal, State, Local not specified - PPO | Admitting: Internal Medicine

## 2019-05-28 ENCOUNTER — Encounter: Payer: Self-pay | Admitting: Gastroenterology

## 2019-05-28 VITALS — BP 130/70 | HR 58 | Temp 98.4°F | Ht 66.0 in | Wt 266.0 lb

## 2019-05-28 VITALS — BP 128/74 | HR 57 | Temp 98.1°F | Ht 66.0 in | Wt 267.6 lb

## 2019-05-28 DIAGNOSIS — I10 Essential (primary) hypertension: Secondary | ICD-10-CM

## 2019-05-28 DIAGNOSIS — Z8249 Family history of ischemic heart disease and other diseases of the circulatory system: Secondary | ICD-10-CM

## 2019-05-28 DIAGNOSIS — R55 Syncope and collapse: Secondary | ICD-10-CM

## 2019-05-28 DIAGNOSIS — K921 Melena: Secondary | ICD-10-CM | POA: Diagnosis not present

## 2019-05-28 MED ORDER — SUTAB 1479-225-188 MG PO TABS: 1.0000 | ORAL_TABLET | ORAL | 0 refills | Status: DC

## 2019-05-28 NOTE — Progress Notes (Addendum)
History of Present Illness: This is a 64 year old female referred by Panosh, Standley Brooking, MD for the evaluation of hematochezia.  Patient relates that history of intermittent rectal bleeding with bowel movements for many years.  Over the past year she has had daily rectal bleeding with bowel movements and in between bowel movements.  She notes anal bulging.  No itching or discomfort.  She underwent colonoscopy in Feb. 2017 by Dr. Amedeo Plenty showing scattered sigmoid, descending and transverse colon diverticulosis, a 6 mm transverse colon polyp (path: tubular adenoma: and was otherwise unremarkable.  Exam completed to cecum with a good prep.  She relates very mild right lower quadrant pain that she has noted for 2 months.  On April 29 after eating [place] food she had fairly severe RLQ pain, severe diarrhea and a syncopal episode.  Evaluation by Dr. Regis Bill diagnosed a vasovagal episode.  The diarrhea and pain resolved promptly and there has been no recurrence of similar pain or vasovagal symptoms since.  CBC and CRP on May 3 were normal.  Denies weight loss, constipation, change in stool caliber, melena, nausea, vomiting, dysphagia, reflux symptoms, chest pain.    Allergies  Allergen Reactions  . Ciprofloxacin Hcl Hives  . Iodinated Diagnostic Agents Anaphylaxis and Other (See Comments)  . Iohexol Anaphylaxis    Pt.states she stopped breathing and was admitted to the ICU~10 years ago  . Lidocaine Hcl Other (See Comments)    Difficulty breathing Difficulty breathing Difficulty breathing  . Morphine Other (See Comments)    Hallucination Hallucination  . Sulfa Antibiotics Hives and Rash  . Prednisone Other (See Comments)    Flushed face and felt very hot all over with 40 of pred  See march 17 notes Flushed face and felt very hot all over with 40 of pred  See march 17 notes  . Valsartan Cough  . Shellfish Allergy Diarrhea and Nausea Only  . Ciprofloxacin Itching and Rash  . Penicillins Rash    Has  patient had a PCN reaction causing immediate rash, facial/tongue/throat swelling, SOB or lightheadedness with hypotension: yes Has patient had a PCN reaction causing severe rash involving mucus membranes or skin necrosis: yes Has patient had a PCN reaction that required hospitalization: no Has patient had a PCN reaction occurring within the last 10 years: no If all of the above answers are "NO", then may proceed with Cephalosporin use.   . Sulfamethoxazole-Trimethoprim Rash  . Sulfonamide Derivatives Rash   Outpatient Medications Prior to Visit  Medication Sig Dispense Refill  . acetaminophen (TYLENOL) 325 MG tablet Take 650 mg by mouth every 6 (six) hours as needed for mild pain.    . chlorthalidone (HYGROTON) 25 MG tablet Take 1 tablet (25 mg total) by mouth daily. 90 tablet 3  . Multiple Vitamin (MULTIVITAMIN WITH MINERALS) TABS tablet Take 1 tablet by mouth daily. Women's formula    . metoprolol succinate (TOPROL XL) 25 MG 24 hr tablet Take 1 tablet (25 mg total) by mouth daily. (Patient not taking: Reported on 05/28/2019) 90 tablet 3   No facility-administered medications prior to visit.   Past Medical History:  Diagnosis Date  . Allergy   . Chronic tension headaches   . Hx of varicella   . Hypertension   . Kidney stones   . Near syncope   . Nephrolithiasis    history of  passed on own.   . Palpitations   . Thyroid disease   . Tubular adenoma of colon 02/1997  w/ HGD  . Varicose veins    Past Surgical History:  Procedure Laterality Date  . ABDOMINAL HYSTERECTOMY     fibroids non cancer   . CATARACT EXTRACTION     Bilateral  . cataract surgery Right 03-02-2012  . CHOLECYSTECTOMY    . ROTATOR CUFF REPAIR Right   . TONSILLECTOMY     Social History   Socioeconomic History  . Marital status: Married    Spouse name: Not on file  . Number of children: 3  . Years of education: 75  . Highest education level: Not on file  Occupational History  . Occupation: Associate Professor: [employer]  Tobacco Use  . Smoking status: Never Smoker  . Smokeless tobacco: Never Used  Substance and Sexual Activity  . Alcohol use: No  . Drug use: No  . Sexual activity: Yes    Partners: Male  Other Topics Concern  . Not on file  Social History Narrative   [employer]- BA. 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 2 daughters- '82, '93, 1 son- '87. 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 Difficulty of Paying Living Expenses:   Food Insecurity:   . Worried About Charity fundraiser in the Last Year:   . Arboriculturist in the Last Year:   Transportation Needs:   . Film/video editor (Medical):   Marland Kitchen Lack of Transportation (Non-Medical):   Physical Activity:   . Days of Exercise per Week:   . Minutes of Exercise per Session:   Stress:   . Feeling of Stress :   Social Connections:   . Frequency of Communication with Friends and Family:   . Frequency of Social Gatherings with Friends and Family:   . Attends Religious Services:   . Active Member of Clubs or Organizations:   . Attends Archivist Meetings:   Marland Kitchen Marital Status:    Family History  Problem Relation Age of Onset  . Cancer Mother        CERVICAL uterin cancer   . Liver disease Mother        from chronic heaptitis   . Diabetes Mother   . Heart disease Father        avdisease avr  . Stroke Father   . Prostate cancer Father   . Cancer Sister        Ovarian and endometrial  . Cancer Maternal Aunt        Colon  . Heart disease Maternal Grandfather   . Heart disease Paternal Grandmother   . Heart disease Paternal Grandfather   . Diabetes Sister   . Diabetes Brother       Review of Systems:  Pertinent positive and negative review of systems were noted in the above HPI section. All other review of systems were otherwise negative.   Physical Exam: General: Well developed, well nourished, obese, no acute distress Head: Normocephalic and atraumatic Eyes:  sclerae anicteric, EOMI Ears: Normal auditory acuity Mouth: Not examined, mask on during Covid-19 pandemic Neck: Supple, no masses or thyromegaly Lungs: Clear throughout to auscultation  Heart: Regular rate and rhythm; no murmurs, rubs or bruits Abdomen: Soft, non tender and non distended. No masses, hepatosplenomegaly or hernias noted. Normal Bowel sounds Rectal: Hemorrhoids, internal prolapsed and external, a localized area with a fine surface nodularity, heme + brown stool, no tenderness Musculoskeletal: Symmetrical with no gross deformities  Skin: No lesions on visible extremities Pulses:  Normal pulses noted Extremities: No clubbing, cyanosis, edema or deformities noted Neurological: Alert oriented x 4, grossly nonfocal Cervical Nodes:  No significant cervical adenopathy Inguinal Nodes: No significant inguinal adenopathy Psychological:  Alert and cooperative. Normal mood and affect   Assessment and Recommendations:  1.  Persistent daily hematochezia with and without bowel movements.  Suspected hemorrhoidal bleeding.  Continue Preparation H cream twice daily.  Begin Preparation H suppositories PR twice daily coated with 1% hydrocortisone cream.  Standard rectal care instructions.  Rule out proctitis, neoplasm, other disorder. Suspect she will need surgical management for internal and external hemorrhoids. Schedule colonoscopy. The risks (including bleeding, perforation, infection, missed lesions, medication reactions and possible hospitalization or surgery if complications occur), benefits, and alternatives to colonoscopy with possible biopsy and possible polypectomy were discussed with the patient and they consent to proceed.    2.  Personal history of an adenomatous colon polyp in 2017. Not yet due for surveillance. Colonoscopy as above.   3. Mild RLQ pain.  Etiology unclear.  Await findings of colonoscopy.  If symptoms persist consider CT AP.  cc: Panosh, Standley Brooking, MD [address] [place],  [place] [address]

## 2019-05-28 NOTE — Patient Instructions (Signed)
You can use over the counter hydrocortisone cream and hemorrhoid suppositories as needed.   You have been scheduled for a colonoscopy. Please follow written instructions given to you at your visit today.  Please pick up your prep supplies at the pharmacy within the next 1-3 days. If you use inhalers (even only as needed), please bring them with you on the day of your procedure.  Normal BMI (Body Mass Index- based on height and weight) is between 19 and 25. Your BMI today is Body mass index is 42.93 kg/m. Marland Kitchen Please consider follow up  regarding your BMI with your Primary Care Provider.  Thank you for choosing me and [facility].  Pricilla Riffle. Dagoberto Ligas., MD., Marval Regal

## 2019-05-28 NOTE — Progress Notes (Signed)
Sinus rhythm rate 56 NS st t wave changes  no sig change from previous ekg

## 2019-05-28 NOTE — Patient Instructions (Addendum)
suspecting that your near faint was  A reaction to the Gi symptoms you had . EKG today  Non specific  Changes  Echo updated

## 2019-05-29 ENCOUNTER — Telehealth: Payer: Self-pay | Admitting: Internal Medicine

## 2019-05-29 NOTE — Telephone Encounter (Signed)
Alicia [name] and Alicia [name] can you see if can be done before her colon to be sure all is ok?

## 2019-05-29 NOTE — Telephone Encounter (Signed)
Called patient and she stated that she has a colonoscopy scheduled for 06/11/19 and is wondering if she is going to have the Echocardiogram done before this procedure? Please advise.

## 2019-05-29 NOTE — Telephone Encounter (Signed)
Called patient and let her know that I have reached out to our referral coordinator and Suzi Roots has sent a message to [facility] and patient stated that someone just called her and scheduled her for 06/21/19 and she is going to try to call them to see if she can have done before her colonoscopy. Patient verbalized an understanding.

## 2019-05-29 NOTE — Telephone Encounter (Signed)
Patient questions about an upcoming test that she is having done.   Please advise

## 2019-05-30 ENCOUNTER — Encounter: Payer: Self-pay | Admitting: Gastroenterology

## 2019-05-30 ENCOUNTER — Other Ambulatory Visit: Payer: Self-pay

## 2019-05-30 ENCOUNTER — Ambulatory Visit (HOSPITAL_COMMUNITY): Payer: Federal, State, Local not specified - PPO | Attending: Cardiovascular Disease

## 2019-05-30 DIAGNOSIS — R55 Syncope and collapse: Secondary | ICD-10-CM | POA: Insufficient documentation

## 2019-05-31 NOTE — Progress Notes (Signed)
Echo shows no worrisome findings   there is some  thickining of the left bentricle muscle that can occur with hypertension.  But should not  cause syncope.  I do advise we have good blood pressure  control  and  goal should be closer to 120/80   Advise  check bp readings over the next month and send in readings   Should be good to go for your colonoscopy

## 2019-06-11 ENCOUNTER — Encounter: Payer: Self-pay | Admitting: Gastroenterology

## 2019-06-11 ENCOUNTER — Ambulatory Visit (AMBULATORY_SURGERY_CENTER): Payer: Federal, State, Local not specified - PPO | Admitting: Gastroenterology

## 2019-06-11 ENCOUNTER — Other Ambulatory Visit: Payer: Self-pay

## 2019-06-11 VITALS — BP 113/57 | HR 59 | Temp 96.9°F | Resp 13 | Ht 66.0 in | Wt 266.0 lb

## 2019-06-11 DIAGNOSIS — K641 Second degree hemorrhoids: Secondary | ICD-10-CM

## 2019-06-11 DIAGNOSIS — K921 Melena: Secondary | ICD-10-CM

## 2019-06-11 DIAGNOSIS — K5730 Diverticulosis of large intestine without perforation or abscess without bleeding: Secondary | ICD-10-CM

## 2019-06-11 DIAGNOSIS — Z86010 Personal history of colonic polyps: Secondary | ICD-10-CM

## 2019-06-11 MED ORDER — SODIUM CHLORIDE 0.9 % IV SOLN: 500.0000 mL | Freq: Once | INTRAVENOUS | Status: DC

## 2019-06-11 NOTE — Progress Notes (Signed)
Report to PACU, RN, vss, BBS= Clear.  

## 2019-06-11 NOTE — Op Note (Signed)
[facility] Patient Name: Alicia [name] Procedure Date: 06/11/2019 2:46 PM MRN: [id number] Endoscopist: Ladene Artist , MD Age: 64 Referring MD:  Date of Birth: Jul 05, 1955 Gender: Female Account #: [id number] Procedure:                Colonoscopy Indications:              Hematochezia. Personal history of adenomatous colon                            polyps. Medicines:                Monitored Anesthesia Care Procedure:                Pre-Anesthesia Assessment:                           - Prior to the procedure, a History and Physical                            was performed, and patient medications and                            allergies were reviewed. The patient's tolerance of                            previous anesthesia was also reviewed. The risks                            and benefits of the procedure and the sedation                            options and risks were discussed with the patient.                            All questions were answered, and informed consent                            was obtained. Prior Anticoagulants: The patient has                            taken no previous anticoagulant or antiplatelet                            agents. ASA Grade Assessment: III - A patient with                            severe systemic disease. After reviewing the risks                            and benefits, the patient was deemed in                            satisfactory condition to undergo the procedure.  After obtaining informed consent, the colonoscope                            was passed under direct vision. Throughout the                            procedure, the patient's blood pressure, pulse, and                            oxygen saturations were monitored continuously. The                            Colonoscope was introduced through the anus and                            advanced to the the cecum, identified by                      appendiceal orifice and ileocecal valve. The                            ileocecal valve, appendiceal orifice, and rectum                            were photographed. The quality of the bowel                            preparation was adequate. The colonoscopy was                            performed without difficulty. The patient tolerated                            the procedure well. Scope In: 2:52:16 PM Scope Out: 3:04:05 PM Scope Withdrawal Time: 0 hours 9 minutes 9 seconds  Total Procedure Duration: 0 hours 11 minutes 49 seconds  Findings:                 External hemorrhoids and hemorrhoid tags were found                            on perianal exam.                           Multiple medium-mouthed diverticula were found in                            the entire colon. There was narrowing of the colon                            in association with the diverticular opening. There                            was evidence of diverticular spasm. There was no  evidence of diverticular bleeding.                           Internal hemorrhoids were found during                            retroflexion. The hemorrhoids were medium-sized and                            Grade II (internal hemorrhoids that prolapse but                            reduce spontaneously).                           The exam was otherwise without abnormality on                            direct and retroflexion views. Complications:            No immediate complications. Estimated blood loss:                            None. Estimated Blood Loss:     Estimated blood loss: none. Impression:               - External hemorrhoids, hemorrhoid tags found on                            perianal exam.                           - Severe diverticulosis thoughout the colon.                           - Grade II internal hemorrhoids.                           - Otherwise normal  appearing colonoscopy.                           - No specimens collected. Recommendation:           - Repeat colonoscopy in 7 years for surveillance.                           - Patient has a contact number available for                            emergencies. The signs and symptoms of potential                            delayed complications were discussed with the                            patient. Return to normal activities tomorrow.  Written discharge instructions were provided to the                            patient.                           - High fiber diet.                           - Continue present medications.                           - Colorectal surgery referral for hemorrhoid mgmt. Ladene Artist, MD 06/11/2019 3:10:48 PM This report has been signed electronically.

## 2019-06-11 NOTE — Progress Notes (Signed)
VS-DT 

## 2019-06-11 NOTE — Patient Instructions (Signed)
Discharge instructions given. °Handouts on Diverticulosis and Hemorrhoids. °Resume previous medications. °YOU HAD AN ENDOSCOPIC PROCEDURE TODAY AT THE  ENDOSCOPY CENTER:   Refer to the procedure report that was given to you for any specific questions about what was found during the examination.  If the procedure report does not answer your questions, please call your gastroenterologist to clarify.  If you requested that your care partner not be given the details of your procedure findings, then the procedure report has been included in a sealed envelope for you to review at your convenience later. ° °YOU SHOULD EXPECT: Some feelings of bloating in the abdomen. Passage of more gas than usual.  Walking can help get rid of the air that was put into your GI tract during the procedure and reduce the bloating. If you had a lower endoscopy (such as a colonoscopy or flexible sigmoidoscopy) you may notice spotting of blood in your stool or on the toilet paper. If you underwent a bowel prep for your procedure, you may not have a normal bowel movement for a few days. ° °Please Note:  You might notice some irritation and congestion in your nose or some drainage.  This is from the oxygen used during your procedure.  There is no need for concern and it should clear up in a day or so. ° °SYMPTOMS TO REPORT IMMEDIATELY: ° °Following lower endoscopy (colonoscopy or flexible sigmoidoscopy): ° Excessive amounts of blood in the stool ° Significant tenderness or worsening of abdominal pains ° Swelling of the abdomen that is new, acute ° Fever of 100°F or higher ° ° °For urgent or emergent issues, a gastroenterologist can be reached at any hour by calling [phone]. °Do not use MyChart messaging for urgent concerns.  ° ° °DIET:  We do recommend a small meal at first, but then you may proceed to your regular diet.  Drink plenty of fluids but you should avoid alcoholic beverages for 24 hours. ° °ACTIVITY:  You should plan to  take it easy for the rest of today and you should NOT DRIVE or use heavy machinery until tomorrow (because of the sedation medicines used during the test).   ° °FOLLOW UP: °Our staff will call the number listed on your records 48-72 hours following your procedure to check on you and address any questions or concerns that you may have regarding the information given to you following your procedure. If we do not reach you, we will leave a message.  We will attempt to reach you two times.  During this call, we will ask if you have developed any symptoms of COVID 19. If you develop any symptoms (ie: fever, flu-like symptoms, shortness of breath, cough etc.) before then, please call [phone].  If you test positive for Covid 19 in the 2 weeks post procedure, please call and report this information to us.   ° °If any biopsies were taken you will be contacted by phone or by letter within the next 1-3 weeks.  Please call us at [phone] if you have not heard about the biopsies in 3 weeks.  ° ° °SIGNATURES/CONFIDENTIALITY: °You and/or your care partner have signed paperwork which will be entered into your electronic medical record.  These signatures attest to the fact that that the information above on your After Visit Summary has been reviewed and is understood.  Full responsibility of the confidentiality of this discharge information lies with you and/or your care-partner.  °

## 2019-06-12 ENCOUNTER — Telehealth: Payer: Self-pay

## 2019-06-12 NOTE — Telephone Encounter (Signed)
Referral faxed to [facility] for external hemorrhoid consult.  Patient will be contacted directly for an appt date and time.

## 2019-06-13 ENCOUNTER — Telehealth: Payer: Self-pay | Admitting: *Deleted

## 2019-06-13 ENCOUNTER — Telehealth: Payer: Self-pay

## 2019-06-13 NOTE — Telephone Encounter (Signed)
  Follow up Call-  Call back number 06/11/2019  Post procedure Call Back phone  # [phone]  Permission to leave phone message Yes  Some recent data might be hidden   Answering machine not set up

## 2019-06-13 NOTE — Telephone Encounter (Signed)
Voicemail full on follow up call.

## 2019-06-20 NOTE — Telephone Encounter (Signed)
[facility] has not been able to reach the patient.  MyChart message sent asking her to please call them to schedule.

## 2019-06-21 ENCOUNTER — Other Ambulatory Visit (HOSPITAL_COMMUNITY): Payer: Federal, State, Local not specified - PPO

## 2019-06-28 DIAGNOSIS — Z01419 Encounter for gynecological examination (general) (routine) without abnormal findings: Secondary | ICD-10-CM | POA: Diagnosis not present

## 2019-06-28 DIAGNOSIS — N83209 Unspecified ovarian cyst, unspecified side: Secondary | ICD-10-CM | POA: Diagnosis not present

## 2019-06-28 DIAGNOSIS — N393 Stress incontinence (female) (male): Secondary | ICD-10-CM | POA: Diagnosis not present

## 2019-07-24 DIAGNOSIS — K643 Fourth degree hemorrhoids: Secondary | ICD-10-CM | POA: Diagnosis not present

## 2019-07-25 ENCOUNTER — Telehealth: Payer: Self-pay | Admitting: Gastroenterology

## 2019-07-25 NOTE — Telephone Encounter (Signed)
Patient would like a second opinion for her hemorrhoids.  Saw [facility], but seeking opinion from [facility]  She asked that I fax her information to [phone].  She reports that once they receive the fax they will contact her with the appt.  Referral faxed.

## 2019-08-04 DIAGNOSIS — H43811 Vitreous degeneration, right eye: Secondary | ICD-10-CM | POA: Diagnosis not present

## 2019-08-04 DIAGNOSIS — D3132 Benign neoplasm of left choroid: Secondary | ICD-10-CM | POA: Diagnosis not present

## 2019-08-04 DIAGNOSIS — R519 Headache, unspecified: Secondary | ICD-10-CM | POA: Diagnosis not present

## 2019-08-04 DIAGNOSIS — H43813 Vitreous degeneration, bilateral: Secondary | ICD-10-CM | POA: Diagnosis not present

## 2019-08-04 DIAGNOSIS — H538 Other visual disturbances: Secondary | ICD-10-CM | POA: Diagnosis not present

## 2019-08-04 DIAGNOSIS — H269 Unspecified cataract: Secondary | ICD-10-CM | POA: Diagnosis not present

## 2019-08-04 DIAGNOSIS — H43391 Other vitreous opacities, right eye: Secondary | ICD-10-CM | POA: Diagnosis not present

## 2019-08-04 DIAGNOSIS — Z9889 Other specified postprocedural states: Secondary | ICD-10-CM | POA: Diagnosis not present

## 2019-08-04 DIAGNOSIS — D3131 Benign neoplasm of right choroid: Secondary | ICD-10-CM | POA: Diagnosis not present

## 2019-08-04 DIAGNOSIS — I10 Essential (primary) hypertension: Secondary | ICD-10-CM | POA: Diagnosis not present

## 2019-08-19 DIAGNOSIS — D3132 Benign neoplasm of left choroid: Secondary | ICD-10-CM | POA: Diagnosis not present

## 2019-08-19 DIAGNOSIS — D3131 Benign neoplasm of right choroid: Secondary | ICD-10-CM | POA: Diagnosis not present

## 2019-08-19 DIAGNOSIS — H43811 Vitreous degeneration, right eye: Secondary | ICD-10-CM | POA: Diagnosis not present

## 2019-09-02 DIAGNOSIS — K642 Third degree hemorrhoids: Secondary | ICD-10-CM | POA: Diagnosis not present

## 2019-10-09 DIAGNOSIS — S83241A Other tear of medial meniscus, current injury, right knee, initial encounter: Secondary | ICD-10-CM | POA: Diagnosis not present

## 2019-10-17 DIAGNOSIS — N83201 Unspecified ovarian cyst, right side: Secondary | ICD-10-CM | POA: Diagnosis not present

## 2019-10-25 DIAGNOSIS — M25561 Pain in right knee: Secondary | ICD-10-CM | POA: Diagnosis not present

## 2019-10-25 DIAGNOSIS — M222X1 Patellofemoral disorders, right knee: Secondary | ICD-10-CM | POA: Diagnosis not present

## 2019-10-25 DIAGNOSIS — M1711 Unilateral primary osteoarthritis, right knee: Secondary | ICD-10-CM | POA: Diagnosis not present

## 2019-10-25 DIAGNOSIS — G8929 Other chronic pain: Secondary | ICD-10-CM | POA: Diagnosis not present

## 2019-11-12 DIAGNOSIS — M4316 Spondylolisthesis, lumbar region: Secondary | ICD-10-CM | POA: Diagnosis not present

## 2019-11-12 DIAGNOSIS — M5116 Intervertebral disc disorders with radiculopathy, lumbar region: Secondary | ICD-10-CM | POA: Diagnosis not present

## 2019-11-18 DIAGNOSIS — M5459 Other low back pain: Secondary | ICD-10-CM | POA: Diagnosis not present

## 2019-11-18 DIAGNOSIS — M25561 Pain in right knee: Secondary | ICD-10-CM | POA: Diagnosis not present

## 2019-11-19 DIAGNOSIS — H35371 Puckering of macula, right eye: Secondary | ICD-10-CM | POA: Diagnosis not present

## 2019-11-25 DIAGNOSIS — M5459 Other low back pain: Secondary | ICD-10-CM | POA: Diagnosis not present

## 2019-11-25 DIAGNOSIS — M25561 Pain in right knee: Secondary | ICD-10-CM | POA: Diagnosis not present

## 2019-11-27 DIAGNOSIS — M5459 Other low back pain: Secondary | ICD-10-CM | POA: Diagnosis not present

## 2019-11-27 DIAGNOSIS — M25561 Pain in right knee: Secondary | ICD-10-CM | POA: Diagnosis not present

## 2019-12-04 DIAGNOSIS — M5459 Other low back pain: Secondary | ICD-10-CM | POA: Diagnosis not present

## 2019-12-04 DIAGNOSIS — M25561 Pain in right knee: Secondary | ICD-10-CM | POA: Diagnosis not present

## 2019-12-16 ENCOUNTER — Ambulatory Visit: Payer: Federal, State, Local not specified - PPO | Admitting: Internal Medicine

## 2019-12-16 ENCOUNTER — Other Ambulatory Visit: Payer: Self-pay

## 2019-12-16 ENCOUNTER — Encounter: Payer: Self-pay | Admitting: Internal Medicine

## 2019-12-16 VITALS — BP 152/92 | HR 55 | Ht 66.0 in | Wt 261.2 lb

## 2019-12-16 DIAGNOSIS — I10 Essential (primary) hypertension: Secondary | ICD-10-CM | POA: Diagnosis not present

## 2019-12-16 DIAGNOSIS — E782 Mixed hyperlipidemia: Secondary | ICD-10-CM | POA: Diagnosis not present

## 2019-12-16 MED ORDER — CHLORTHALIDONE 50 MG PO TABS
50.0000 mg | ORAL_TABLET | Freq: Every day | ORAL | 3 refills | Status: DC
Start: 2019-12-16 — End: 2021-05-11

## 2019-12-16 MED ORDER — POTASSIUM CHLORIDE ER 10 MEQ PO TBCR
10.0000 meq | EXTENDED_RELEASE_TABLET | Freq: Every day | ORAL | 3 refills | Status: DC
Start: 2019-12-16 — End: 2021-04-04

## 2019-12-16 NOTE — Patient Instructions (Signed)
Medication Instructions:  Your physician has recommended you make the following change in your medication:  1.) increase chlorthalidone to 50 mg daily 2.) potassium chloride 10 meq daily   *If you need a refill on your cardiac medications before your next appointment, please call your pharmacy*   Lab Work: In 10-14 days - BMET, CBC, Vit D, LIPIDS  If you have labs (blood work) drawn today and your tests are completely normal, you will receive your results only by: Marland Kitchen MyChart Message (if you have MyChart) OR . A paper copy in the mail If you have any lab test that is abnormal or we need to change your treatment, we will call you to review the results.   Testing/Procedures: none   Follow-Up: At [facility], you and your health needs are our priority.  As part of our continuing mission to provide you with exceptional heart care, we have created designated Provider Care Teams.  These Care Teams include your primary Cardiologist (physician) and Advanced Practice Providers (APPs -  Physician Assistants and Nurse Practitioners) who all work together to provide you with the care you need, when you need it.  Your next appointment:   9 month(s)  The format for your next appointment:   In Person  Provider:   You may see Dorris Carnes, MD or one of the following Advanced Practice Providers on your designated Care Team:    Richardson Dopp, PA-C  Robbie Lis, [place]    Other Instructions

## 2019-12-16 NOTE — Progress Notes (Signed)
Cardiology Office Note   Date:  12/16/2019   ID:  Alicia [name], DOB Jul 18, 1955, MRN [id number]  PCP:  Burnis Medin, MD  Cardiologist:   Dorris Carnes, MD   Pt presents for f/u of CP and HTN   History of Present Illness: Alicia [name] is a 64 y.o. 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    Takes BP at home   130s to 140s/  Denies CP   Breathing is OK  No significant palptations   Admits to not starting Toprol XL  Current Outpatient Medications  Medication Sig Dispense Refill  . acetaminophen (TYLENOL) 325 MG tablet Take 650 mg by mouth every 6 (six) hours as needed for mild pain.    . chlorthalidone (HYGROTON) 25 MG tablet Take 1 tablet (25 mg total) by mouth daily. 90 tablet 3  . Multiple Vitamin (MULTIVITAMIN WITH MINERALS) TABS tablet Take 1 tablet by mouth daily. Women's formula     No current facility-administered medications for this visit.    Allergies:   Ciprofloxacin hcl, Iodinated diagnostic agents, Iohexol, Lidocaine hcl, Morphine, Sulfa antibiotics, Prednisone, Valsartan, Shellfish allergy, Ciprofloxacin, Penicillins, Sulfamethoxazole-trimethoprim, and Sulfonamide derivatives   Past Medical History:  Diagnosis Date  . Allergy   . Cataract    removed bilateral -2014  . Chronic tension headaches   . Hx of varicella   . Hypertension   . Kidney stones   . Near syncope   . Nephrolithiasis    history of  passed on own.   . Palpitations   . Thyroid disease    pt. denies 06/11/19  . Tubular adenoma of colon 02/1997   w/ HGD  . Varicose veins     Past Surgical History:  Procedure Laterality Date  . ABDOMINAL HYSTERECTOMY     fibroids non cancer   . CATARACT EXTRACTION     Bilateral  . cataract surgery Right 03-02-2012  . CHOLECYSTECTOMY    . ROTATOR CUFF REPAIR Right     . TONSILLECTOMY       Social History:  The patient  reports that she has never smoked. She has never used smokeless tobacco. She reports that she does not drink alcohol and does not use drugs.   Family History:  The patient's family history includes Cancer in her maternal aunt, mother, and sister; Colon cancer in her paternal aunt; Diabetes in her brother, mother, and sister; Esophageal cancer in her paternal uncle; Heart disease in her father, maternal grandfather, paternal grandfather, and paternal grandmother; Liver disease in her mother; Prostate cancer in her father; Stroke in her father.    ROS:  Please see the history of present illness. All other systems are reviewed and  Negative to the above problem except as noted.    PHYSICAL EXAM: VS:  BP (!) 152/92   Pulse (!) 55   Ht 5\' 6"  (1.676 m)   Wt 261 lb 3.2 oz (118.5 kg)   SpO2 97%   BMI 42.16 kg/m   GEN:  Morbidly obese 64 yo  in no acute distress  HEENT: normal  Neck:  JVP is normal    No  carotid bruits, Cardiac: RRR; No signif murmur No LE edema  Respiratory:  clear to auscultation bilaterally,  GI: soft, nontender, nondistended, +  BS  No hepatomegaly  MS: no deformity Moving all extremities   Skin: warm and dry, no rash  EKG:  EKG is  not ordered today.     Lipid Panel    Component Value Date/Time   CHOL 191 02/15/2019 0858   TRIG 92 02/15/2019 0858   HDL 72 02/15/2019 0858   CHOLHDL 2.7 02/15/2019 0858   CHOLHDL 3 07/17/2015 1500   VLDL 27.8 07/17/2015 1500   LDLCALC 103 (H) 02/15/2019 0858      Wt Readings from Last 3 Encounters:  12/16/19 261 lb 3.2 oz (118.5 kg)  06/11/19 266 lb (120.7 kg)  05/28/19 266 lb (120.7 kg)      ASSESSMENT AND PLAN:  1  HTN Ms Feagan's BP is still above optimal   She is sensitive to meds I would recomm increasing chlorithalidone to 50 mg per day   Add 10 KCL to this    Check BMET in 1 wk      2 CP   Denies  3  Hx palpitations   Denies   4   HCM  Lipids in Jan  LDL 103  HDL 72  Trig 92.    Discussed diet, carbs   Will check Vit D   Reviewed COVID plans   Will be in touch with her re lab results and BP  Tentative f/u in AUg/Sept 2022    Current medicines are reviewed at length with the patient today.  The patient does not have concerns regarding medicines.  F/U with me as needed if symptoms change   Signed, Dorris Carnes, MD  12/16/2019 4:55 PM    [address], [place],   [address] Phone: [phone]; Fax: [phone]

## 2019-12-25 ENCOUNTER — Other Ambulatory Visit (HOSPITAL_COMMUNITY): Payer: Self-pay | Admitting: Physical Medicine and Rehabilitation

## 2019-12-25 DIAGNOSIS — M48061 Spinal stenosis, lumbar region without neurogenic claudication: Secondary | ICD-10-CM

## 2019-12-25 DIAGNOSIS — G8929 Other chronic pain: Secondary | ICD-10-CM

## 2020-01-02 ENCOUNTER — Ambulatory Visit (HOSPITAL_COMMUNITY): Admission: RE | Admit: 2020-01-02 | Payer: Federal, State, Local not specified - PPO | Source: Ambulatory Visit

## 2020-01-02 ENCOUNTER — Encounter (HOSPITAL_COMMUNITY): Payer: Self-pay

## 2020-01-02 DIAGNOSIS — M1711 Unilateral primary osteoarthritis, right knee: Secondary | ICD-10-CM | POA: Diagnosis not present

## 2020-01-02 DIAGNOSIS — M222X1 Patellofemoral disorders, right knee: Secondary | ICD-10-CM | POA: Diagnosis not present

## 2020-01-02 DIAGNOSIS — M25561 Pain in right knee: Secondary | ICD-10-CM | POA: Diagnosis not present

## 2020-01-02 DIAGNOSIS — G8929 Other chronic pain: Secondary | ICD-10-CM | POA: Diagnosis not present

## 2020-01-08 DIAGNOSIS — M1711 Unilateral primary osteoarthritis, right knee: Secondary | ICD-10-CM | POA: Diagnosis not present

## 2020-01-28 ENCOUNTER — Telehealth: Payer: Self-pay | Admitting: Internal Medicine

## 2020-01-28 NOTE — Telephone Encounter (Signed)
Spoke with the pt and she stated her husband tested positive for Covid last night.  Denies any symptoms other than a headache with this but complains of flank pain, has a history of a kidney stone and questioned if this was related?  I advised the pt I am not aware of flank pain being a symptom of Covid and offered a virtual visit to evaluate the symptoms.  Patient stated she will wait, see how she feels and if symptoms worsen she will call back for a virtual visit.  Patient also stated she is going to get tested, asked if the Covid test is negative, should she repeat the test and if so how often?  I advised the pt to call back for a virtual regarding this also and she agreed.  Message sent to PCP.

## 2020-01-28 NOTE — Telephone Encounter (Signed)
Patient is calling and wanted to see if she can speak to the nurse regarding covid symptoms. CB is [phone]

## 2020-01-29 ENCOUNTER — Telehealth (INDEPENDENT_AMBULATORY_CARE_PROVIDER_SITE_OTHER): Payer: Federal, State, Local not specified - PPO | Admitting: Internal Medicine

## 2020-01-29 ENCOUNTER — Encounter: Payer: Self-pay | Admitting: Internal Medicine

## 2020-01-29 VITALS — BP 140/78 | Temp 98.7°F | Ht 66.0 in | Wt 250.0 lb

## 2020-01-29 DIAGNOSIS — R1010 Upper abdominal pain, unspecified: Secondary | ICD-10-CM | POA: Diagnosis not present

## 2020-01-29 DIAGNOSIS — Z20822 Contact with and (suspected) exposure to covid-19: Secondary | ICD-10-CM

## 2020-01-29 DIAGNOSIS — Z87442 Personal history of urinary calculi: Secondary | ICD-10-CM

## 2020-01-29 DIAGNOSIS — R109 Unspecified abdominal pain: Secondary | ICD-10-CM

## 2020-01-29 DIAGNOSIS — R10A0 Flank pain, unspecified side: Secondary | ICD-10-CM

## 2020-01-29 NOTE — Progress Notes (Signed)
Virtual Visit via Video Note  I connected with@ on 01/29/20 at  3:30 PM EST by a video enabled telemedicine application and verified that I am speaking with the correct person using two identifiers. Location patient: home Location provider:work office Persons participating in the virtual visit: patient, provider  [place] national recommendations  regarding COVID 19 pandemic   video visit is advised over in office visit for this patient.  Patient aware  of the limitations of evaluation and management by telemedicine and  availability of in person appointments. and agreed to proceed.   HPI: Alicia [name] presents for video visit regarding stomachache , husband's positive COVID test  Patient developed left left flank pain that radiated similarly to when she had a renal stone.  That is subsided then she developed upper abdominal pain like an ache without associated symptoms or radiation.  Temperature 100.1 last night and 99 today.  No nausea vomiting or diarrhea.  She might have a sore throat for 1 day.  She was tested for COVID 2 days ago and is waiting for the results.  Husband had which seemed like an upper respiratory cold the end of last week and he tested positive results ordered 3 days ago. She has been vaccinated but has not yet had the booster last dose in March.  No chest pain shortness of breath.  No dysuria. . ROS: See pertinent positives and negatives per HPI.  Past Medical History:  Diagnosis Date  . Allergy   . Cataract    removed bilateral -2014  . Chronic tension headaches   . Hx of varicella   . Hypertension   . Kidney stones   . Near syncope   . Nephrolithiasis    history of  passed on own.   . Palpitations   . Thyroid disease    pt. denies 06/11/19  . Tubular adenoma of colon 02/1997   w/ HGD  . Varicose veins     Past Surgical History:  Procedure Laterality Date  . ABDOMINAL HYSTERECTOMY     fibroids non cancer   . CATARACT EXTRACTION      Bilateral  . cataract surgery Right 03-02-2012  . CHOLECYSTECTOMY    . ROTATOR CUFF REPAIR Right   . TONSILLECTOMY      Family History  Problem Relation Age of Onset  . Cancer Mother        CERVICAL uterin cancer   . Liver disease Mother        from chronic heaptitis   . Diabetes Mother   . Heart disease Father        avdisease avr  . Stroke Father   . Prostate cancer Father   . Cancer Sister        Ovarian and endometrial  . Cancer Maternal Aunt        Colon  . Heart disease Maternal Grandfather   . Heart disease Paternal Grandmother   . Heart disease Paternal Grandfather   . Diabetes Sister   . Diabetes Brother   . Colon cancer Paternal Aunt   . Esophageal cancer Paternal Uncle   . Rectal cancer Neg Hx   . Stomach cancer Neg Hx     Social History   Tobacco Use  . Smoking status: Never Smoker  . Smokeless tobacco: Never Used  Vaping Use  . Vaping Use: Never used  Substance Use Topics  . Alcohol use: No  . Drug use: No      Current Outpatient Medications:  .  acetaminophen (TYLENOL) 325 MG tablet, Take 650 mg by mouth every 6 (six) hours as needed for mild pain., Disp: , Rfl:  .  chlorthalidone (HYGROTON) 50 MG tablet, Take 1 tablet (50 mg total) by mouth daily., Disp: 90 tablet, Rfl: 3 .  Multiple Vitamin (MULTIVITAMIN WITH MINERALS) TABS tablet, Take 1 tablet by mouth daily. Women's formula, Disp: , Rfl:  .  potassium chloride (KLOR-CON) 10 MEQ tablet, Take 1 tablet (10 mEq total) by mouth daily., Disp: 90 tablet, Rfl: 3  EXAM: BP Readings from Last 3 Encounters:  01/29/20 140/78  12/16/19 (!) 152/92  06/11/19 (!) 113/57    VITALS per patient if applicable:  GENERAL: alert, oriented, appears well and in no acute distress  HEENT: atraumatic, conjunttiva clear, no obvious abnormalities on inspection of external nose and ears  NECK: normal movements of the head and neck  LUNGS: on inspection no signs of respiratory distress, breathing rate appears  normal, no obvious gross SOB, gasping or wheezing  CV: no obvious cyanosis Points to just above umbilicus as area of discomfort.  No guarding or rebound MS: moves all visible extremities without noticeable abnormality  PSYCH/NEURO: pleasant and cooperative, no obvious depression or anxiety, speech and thought processing grossly intact Lab Results  Component Value Date   WBC 7.1 05/20/2019   HGB 13.7 05/20/2019   HCT 40.0 05/20/2019   PLT 225.0 05/20/2019   GLUCOSE 109 (H) 02/15/2019   CHOL 191 02/15/2019   TRIG 92 02/15/2019   HDL 72 02/15/2019   LDLCALC 103 (H) 02/15/2019   ALT 25 01/02/2017   AST 34 01/02/2017   NA 142 02/15/2019   K 3.8 02/15/2019   CL 102 02/15/2019   CREATININE 0.67 02/15/2019   BUN 14 02/15/2019   CO2 23 02/15/2019   TSH 3.340 02/15/2019   INR 1.0 ratio 04/03/2009   HGBA1C 6.2 (H) 02/15/2019    ASSESSMENT AND PLAN:  Discussed the following assessment and plan:    ICD-10-CM   1. Pain of upper abdomen  R10.10 Urinalysis, Routine w reflex microscopic    Urine Culture  2. Close exposure to COVID-19 virus  Z20.822   3. Flank pain  R10.9 Urinalysis, Routine w reflex microscopic    Urine Culture  4. Personal history of kidney stones  Z87.442 Urinalysis, Routine w reflex microscopic    Urine Culture   Uncertain cause of upper abdominal pain differential diagnosis discussed suppose it could be atypical COVID will observe over the next 24 to 48 hours.  However with her history of renal stones UTI and fever last night advised we get urinalysis and urine culture order placed for [facility] that she can get today or tomorrow. She can inform us when her COVID test is back. Counseled on alarm and urgent symptoms for when she needs to seek urgent care for acute abdomen.  Such as appendicitis etc. Counseled.   Expectant management and discussion of plan and treatment with opportunity to ask questions and all were answered. The patient agreed with the plan and  demonstrated an understanding of the instructions.   Advised to call back or seek an in-person evaluation if worsening  or having  further concerns . Return if symptoms worsen or fail to improve as expected.    Shanon Ace, MD

## 2020-01-30 ENCOUNTER — Other Ambulatory Visit (INDEPENDENT_AMBULATORY_CARE_PROVIDER_SITE_OTHER): Payer: Federal, State, Local not specified - PPO

## 2020-01-30 DIAGNOSIS — R1010 Upper abdominal pain, unspecified: Secondary | ICD-10-CM

## 2020-01-30 DIAGNOSIS — R109 Unspecified abdominal pain: Secondary | ICD-10-CM | POA: Diagnosis not present

## 2020-01-30 DIAGNOSIS — Z87442 Personal history of urinary calculi: Secondary | ICD-10-CM

## 2020-01-30 DIAGNOSIS — R10A0 Flank pain, unspecified side: Secondary | ICD-10-CM

## 2020-01-30 LAB — URINALYSIS, ROUTINE W REFLEX MICROSCOPIC
Bilirubin Urine: NEGATIVE
Hgb urine dipstick: NEGATIVE
Nitrite: NEGATIVE
RBC / HPF: NONE SEEN (ref 0–?)
Specific Gravity, Urine: 1.025 (ref 1.000–1.030)
Total Protein, Urine: NEGATIVE
Urine Glucose: NEGATIVE
Urobilinogen, UA: 0.2 (ref 0.0–1.0)
pH: 6 (ref 5.0–8.0)

## 2020-01-31 ENCOUNTER — Telehealth: Payer: Self-pay | Admitting: Internal Medicine

## 2020-01-31 LAB — URINE CULTURE
MICRO NUMBER:: 11415010
SPECIMEN QUALITY:: ADEQUATE

## 2020-01-31 NOTE — Telephone Encounter (Signed)
Patient is calling and requesting a call back regarding lab results, please advise. CB is [phone]

## 2020-01-31 NOTE — Progress Notes (Signed)
Probably not a uti  await culture results

## 2020-02-01 ENCOUNTER — Other Ambulatory Visit: Payer: Federal, State, Local not specified - PPO

## 2020-02-01 DIAGNOSIS — Z1152 Encounter for screening for COVID-19: Secondary | ICD-10-CM | POA: Diagnosis not present

## 2020-02-01 DIAGNOSIS — Z20822 Contact with and (suspected) exposure to covid-19: Secondary | ICD-10-CM

## 2020-02-03 NOTE — Progress Notes (Signed)
urine culture showed multiple bacteria but not predominant germ. See report   probabaly no uti  but if having problems symptoms then  repeat culture with better attention to clean collection

## 2020-02-04 LAB — NOVEL CORONAVIRUS, NAA: SARS-CoV-2, NAA: NOT DETECTED

## 2020-02-04 NOTE — Progress Notes (Signed)
Mychart message sent: urine culture showed multiple bacteria but not predominant germ. See report   probabaly no uti  but if having problems symptoms then  repeat culture with better attention to clean collection

## 2020-02-04 NOTE — Progress Notes (Signed)
Mychart message sent: Probably not a uti  await culture results

## 2020-02-05 NOTE — Telephone Encounter (Signed)
Patient informed of the results.  States her temperature has been 99 degrees for the past week.  I advised the pt this does not indicate a fever in an adult and advised she return a call if her temperature is higher or she develops any other urinary symptoms as mentioned in the results.  Message sent to PCP.

## 2020-02-06 NOTE — Telephone Encounter (Signed)
noted 

## 2020-02-18 DIAGNOSIS — L723 Sebaceous cyst: Secondary | ICD-10-CM | POA: Diagnosis not present

## 2020-02-18 DIAGNOSIS — L821 Other seborrheic keratosis: Secondary | ICD-10-CM | POA: Diagnosis not present

## 2020-02-18 DIAGNOSIS — D485 Neoplasm of uncertain behavior of skin: Secondary | ICD-10-CM | POA: Diagnosis not present

## 2020-03-13 ENCOUNTER — Other Ambulatory Visit: Payer: Self-pay | Admitting: Obstetrics and Gynecology

## 2020-03-13 DIAGNOSIS — Z1231 Encounter for screening mammogram for malignant neoplasm of breast: Secondary | ICD-10-CM

## 2020-03-16 ENCOUNTER — Inpatient Hospital Stay: Admission: RE | Admit: 2020-03-16 | Payer: Federal, State, Local not specified - PPO | Source: Ambulatory Visit

## 2020-04-06 DIAGNOSIS — I8393 Asymptomatic varicose veins of bilateral lower extremities: Secondary | ICD-10-CM | POA: Diagnosis not present

## 2020-04-07 DIAGNOSIS — M23203 Derangement of unspecified medial meniscus due to old tear or injury, right knee: Secondary | ICD-10-CM | POA: Diagnosis not present

## 2020-04-07 DIAGNOSIS — M1711 Unilateral primary osteoarthritis, right knee: Secondary | ICD-10-CM | POA: Diagnosis not present

## 2020-04-07 DIAGNOSIS — M25561 Pain in right knee: Secondary | ICD-10-CM | POA: Diagnosis not present

## 2020-04-07 DIAGNOSIS — M659 Synovitis and tenosynovitis, unspecified: Secondary | ICD-10-CM | POA: Diagnosis not present

## 2020-04-14 DIAGNOSIS — M1711 Unilateral primary osteoarthritis, right knee: Secondary | ICD-10-CM | POA: Diagnosis not present

## 2020-04-17 HISTORY — PX: KNEE SURGERY: SHX244

## 2020-04-27 DIAGNOSIS — M25551 Pain in right hip: Secondary | ICD-10-CM | POA: Diagnosis not present

## 2020-04-27 DIAGNOSIS — S83241D Other tear of medial meniscus, current injury, right knee, subsequent encounter: Secondary | ICD-10-CM | POA: Diagnosis not present

## 2020-05-04 ENCOUNTER — Ambulatory Visit (HOSPITAL_BASED_OUTPATIENT_CLINIC_OR_DEPARTMENT_OTHER): Payer: Federal, State, Local not specified - PPO | Admitting: Physical Therapy

## 2020-05-07 DIAGNOSIS — S83281A Other tear of lateral meniscus, current injury, right knee, initial encounter: Secondary | ICD-10-CM | POA: Diagnosis not present

## 2020-05-07 DIAGNOSIS — G8918 Other acute postprocedural pain: Secondary | ICD-10-CM | POA: Diagnosis not present

## 2020-05-07 DIAGNOSIS — X58XXXA Exposure to other specified factors, initial encounter: Secondary | ICD-10-CM | POA: Diagnosis not present

## 2020-05-07 DIAGNOSIS — M2242 Chondromalacia patellae, left knee: Secondary | ICD-10-CM | POA: Diagnosis not present

## 2020-05-07 DIAGNOSIS — S83241A Other tear of medial meniscus, current injury, right knee, initial encounter: Secondary | ICD-10-CM | POA: Diagnosis not present

## 2020-05-07 DIAGNOSIS — S83231A Complex tear of medial meniscus, current injury, right knee, initial encounter: Secondary | ICD-10-CM | POA: Diagnosis not present

## 2020-05-07 DIAGNOSIS — Y999 Unspecified external cause status: Secondary | ICD-10-CM | POA: Diagnosis not present

## 2020-05-07 DIAGNOSIS — S83271A Complex tear of lateral meniscus, current injury, right knee, initial encounter: Secondary | ICD-10-CM | POA: Diagnosis not present

## 2020-05-08 ENCOUNTER — Ambulatory Visit: Payer: Federal, State, Local not specified - PPO

## 2020-05-25 DIAGNOSIS — N6321 Unspecified lump in the left breast, upper outer quadrant: Secondary | ICD-10-CM | POA: Diagnosis not present

## 2020-06-02 DIAGNOSIS — N6002 Solitary cyst of left breast: Secondary | ICD-10-CM | POA: Diagnosis not present

## 2020-06-02 DIAGNOSIS — N6321 Unspecified lump in the left breast, upper outer quadrant: Secondary | ICD-10-CM | POA: Diagnosis not present

## 2020-06-23 ENCOUNTER — Ambulatory Visit: Payer: Federal, State, Local not specified - PPO

## 2020-08-18 ENCOUNTER — Encounter: Payer: Self-pay | Admitting: Family Medicine

## 2020-08-18 ENCOUNTER — Ambulatory Visit: Payer: Federal, State, Local not specified - PPO | Admitting: Family Medicine

## 2020-08-18 ENCOUNTER — Other Ambulatory Visit: Payer: Self-pay

## 2020-08-18 VITALS — BP 192/110 | HR 90 | Temp 98.7°F | Wt 259.1 lb

## 2020-08-18 DIAGNOSIS — R109 Unspecified abdominal pain: Secondary | ICD-10-CM

## 2020-08-18 DIAGNOSIS — I10 Essential (primary) hypertension: Secondary | ICD-10-CM | POA: Diagnosis not present

## 2020-08-18 DIAGNOSIS — R350 Frequency of micturition: Secondary | ICD-10-CM | POA: Diagnosis not present

## 2020-08-18 LAB — POCT URINALYSIS DIPSTICK
Bilirubin, UA: NEGATIVE
Blood, UA: NEGATIVE
Glucose, UA: NEGATIVE
Ketones, UA: NEGATIVE
Nitrite, UA: NEGATIVE
Protein, UA: POSITIVE — AB
Spec Grav, UA: 1.025 (ref 1.010–1.025)
Urobilinogen, UA: 0.2 E.U./dL
pH, UA: 6 (ref 5.0–8.0)

## 2020-08-18 MED ORDER — LOSARTAN POTASSIUM 50 MG PO TABS: 50.0000 mg | ORAL_TABLET | Freq: Every day | ORAL | 0 refills | Status: DC

## 2020-08-18 NOTE — Patient Instructions (Signed)
Set up 2 week follow up with Dr Regis Bill  Keep daily sodium intake < 2,500 mg

## 2020-08-18 NOTE — Progress Notes (Signed)
Established Patient Office Visit  Subjective:  Patient ID: Alicia [name], female    DOB: [birth year]  Age: 65 y.o. MRN: [id number]  CC:  Chief Complaint  Patient presents with   Urinary Tract Infection    X1 week, lower abdominal pain, urinary pressure, bloating, urinary frequency, pt states it feels similar to when she had kidney stones    HPI Karisa Hackbarth presents for 1 week history of some lower abdominal "pressure ".  She has felt somewhat bloated.  She states she feels somewhat similar to when she had kidney stones previously.  No flank pain.  No severe pain.  No actual burning with urination.  No gross hematuria.  No fevers or chills.  She states she has chronic abdominal issues of bloating, intermittent diarrhea, history of IBS.  She realizes a lot of this may be more GI related.  History of hypertension.  Currently takes only chlorthalidone.  Has had some recent occasional headaches.  Compliant with chlorthalidone therapy.  No alcohol use.  Past Medical History:  Diagnosis Date   Allergy    Cataract    removed bilateral -2014   Chronic tension headaches    Hx of varicella    Hypertension    Kidney stones    Near syncope    Nephrolithiasis    history of  passed on own.    Palpitations    Thyroid disease    pt. denies 06/11/19   Tubular adenoma of colon 02/1997   w/ HGD   Varicose veins     Past Surgical History:  Procedure Laterality Date   ABDOMINAL HYSTERECTOMY     fibroids non cancer    CATARACT EXTRACTION     Bilateral   cataract surgery Right 03-02-2012   CHOLECYSTECTOMY     ROTATOR CUFF REPAIR Right    TONSILLECTOMY      Family History  Problem Relation Age of Onset   Cancer Mother        CERVICAL uterin cancer    Liver disease Mother        from chronic heaptitis    Diabetes Mother    Heart disease Father        avdisease avr   Stroke Father    Prostate cancer Father    Cancer Sister        Ovarian and endometrial   Cancer  Maternal Aunt        Colon   Heart disease Maternal Grandfather    Heart disease Paternal Grandmother    Heart disease Paternal Grandfather    Diabetes Sister    Diabetes Brother    Colon cancer Paternal Aunt    Esophageal cancer Paternal Uncle    Rectal cancer Neg Hx    Stomach cancer Neg Hx     Social History   Socioeconomic History   Marital status: Married    Spouse name: Not on file   Number of children: 3   Years of education: 16   Highest education level: Not on file  Occupational History   Occupation: Product manager: [employer]  Tobacco Use   Smoking status: Never   Smokeless tobacco: Never  Vaping Use   Vaping Use: Never used  Substance and Sexual Activity   Alcohol use: No   Drug use: No   Sexual activity: Yes    Partners: Male  Other Topics Concern   Not on file  Social History Narrative   [employer]- 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 2 daughters- '82, '93, 1 son- '87. 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     chlorthalidone (HYGROTON) 50 MG tablet Take 1 tablet (50 mg total) by mouth daily. 90 tablet 3   Multiple Vitamin (MULTIVITAMIN WITH MINERALS) TABS tablet Take 1 tablet by mouth daily. Women's formula     potassium chloride (KLOR-CON) 10 MEQ tablet Take 1 tablet (10  mEq total) by mouth daily. 90 tablet 3   No facility-administered medications prior to visit.    Allergies  Allergen Reactions   Ciprofloxacin Hcl Hives   Iodinated Diagnostic Agents Anaphylaxis and Other (See Comments)   Iohexol Anaphylaxis    Pt.states she stopped breathing and was admitted to the ICU~10 years ago   Lidocaine Hcl Other (See Comments)    Difficulty breathing Difficulty breathing Difficulty breathing   Morphine Other (See Comments)    Hallucination Hallucination   Sulfa Antibiotics Hives and Rash   Prednisone Other (See Comments)    Flushed face and felt very hot all over with 40 of pred  See march 17 notes Flushed face and felt very hot all over with 40 of pred  See march 17 notes   Valsartan Cough   Shellfish Allergy Diarrhea and Nausea Only   Ciprofloxacin Itching and Rash   Penicillins Rash    Has patient had a PCN reaction causing immediate rash, facial/tongue/throat swelling, SOB or lightheadedness with hypotension: yes Has patient had a PCN reaction causing severe rash involving mucus membranes or skin necrosis: yes Has patient had a PCN reaction that required hospitalization: no Has patient had a PCN reaction occurring within the last 10 years: no If all of the above answers are "NO", then may proceed with Cephalosporin use.    Sulfamethoxazole-Trimethoprim Rash   Sulfonamide Derivatives Rash    ROS Review of Systems  Constitutional:  Negative for fatigue.  Eyes:  Negative for visual disturbance.  Respiratory:  Negative for cough, chest tightness, shortness of breath and wheezing.   Cardiovascular:  Negative for chest pain, palpitations and leg swelling.  Gastrointestinal:  Negative for anal bleeding, blood in stool, nausea and vomiting.  Genitourinary:  Negative for flank pain and hematuria.  Neurological:  Negative for dizziness, seizures, syncope, weakness, light-headedness and headaches.     Objective:    Physical Exam Vitals reviewed.   Constitutional:      Appearance: Normal appearance.  Cardiovascular:     Rate and Rhythm: Normal rate and regular rhythm.  Pulmonary:     Effort: Pulmonary effort is normal.     Breath sounds: Normal breath sounds.  Abdominal:     General: There is no distension.     Palpations: Abdomen  is soft. There is no mass.     Tenderness: There is no abdominal tenderness. There is no guarding or rebound.  Neurological:     Mental Status: She is alert.    BP (!) 192/110 (BP Location: Left Arm, Cuff Size: Normal)   Pulse 90   Temp 98.7 F (37.1 C) (Oral)   Wt 259 lb 1.6 oz (117.5 kg)   SpO2 98%   BMI 41.82 kg/m  Wt Readings from Last 3 Encounters:  08/18/20 259 lb 1.6 oz (117.5 kg)  01/29/20 250 lb (113.4 kg)  12/16/19 261 lb 3.2 oz (118.5 kg)     Health Maintenance Due  Topic Date Due   Zoster Vaccines- Shingrix (1 of 2) Never done   PAP SMEAR-Modifier  03/17/2012   TETANUS/TDAP  02/17/2013   COVID-19 Vaccine (3 - Booster for Pfizer series) 09/06/2019   MAMMOGRAM  08/16/2020   INFLUENZA VACCINE  08/17/2020    There are no preventive care reminders to display for this patient.  Lab Results  Component Value Date   TSH 3.340 02/15/2019   Lab Results  Component Value Date   WBC 7.1 05/20/2019   HGB 13.7 05/20/2019   HCT 40.0 05/20/2019   MCV 91.9 05/20/2019   PLT 225.0 05/20/2019   Lab Results  Component Value Date   NA 142 02/15/2019   K 3.8 02/15/2019   CO2 23 02/15/2019   GLUCOSE 109 (H) 02/15/2019   BUN 14 02/15/2019   CREATININE 0.67 02/15/2019   BILITOT 1.7 (H) 01/02/2017   ALKPHOS 65 01/02/2017   AST 34 01/02/2017   ALT 25 01/02/2017   PROT 7.0 01/02/2017   ALBUMIN 3.5 01/02/2017   CALCIUM 9.1 02/15/2019   ANIONGAP 13 01/02/2017   GFR 114.87 07/12/2016   Lab Results  Component Value Date   CHOL 191 02/15/2019   Lab Results  Component Value Date   HDL 72 02/15/2019   Lab Results  Component Value Date   LDLCALC 103 (H) 02/15/2019   Lab  Results  Component Value Date   TRIG 92 02/15/2019   Lab Results  Component Value Date   CHOLHDL 2.7 02/15/2019   Lab Results  Component Value Date   HGBA1C 6.2 (H) 02/15/2019      Assessment & Plan:   #1 lower abdominal pressure.  Urine dipstick reveals no blood and no nitrites.  Only small leukocytes.  Doubt UTI.  Suspect may be related more to her chronic IBS symptoms.  Doubt kidney stone with no blood in urine.  -Urine culture sent -Stay well-hydrated and follow-up immediately for any fever or other changes symptoms  #2 severe hypertension.  Repeat reading today left and right arm seated 192/110 -Try to keep daily sodium intake less than 2400 mg -We discussed adding losartan 50 mg daily to her chlorthalidone and recommend follow-up with primary in 2 to 3 weeks   Meds ordered this encounter  Medications   losartan (COZAAR) 50 MG tablet    Sig: Take 1 tablet (50 mg total) by mouth daily.    Dispense:  30 tablet    Refill:  0    Follow-up: Return in about 2 weeks (around 09/01/2020).    Carolann Littler, MD

## 2020-08-19 ENCOUNTER — Other Ambulatory Visit: Payer: Self-pay

## 2020-08-19 ENCOUNTER — Emergency Department (HOSPITAL_BASED_OUTPATIENT_CLINIC_OR_DEPARTMENT_OTHER)
Admission: EM | Admit: 2020-08-19 | Discharge: 2020-08-19 | Disposition: A | Discharge status: Home / Self Care | Payer: Federal, State, Local not specified - PPO | Attending: Emergency Medicine | Admitting: Emergency Medicine

## 2020-08-19 ENCOUNTER — Emergency Department (HOSPITAL_BASED_OUTPATIENT_CLINIC_OR_DEPARTMENT_OTHER): Payer: Federal, State, Local not specified - PPO

## 2020-08-19 DIAGNOSIS — I10 Essential (primary) hypertension: Secondary | ICD-10-CM | POA: Diagnosis not present

## 2020-08-19 DIAGNOSIS — Z20822 Contact with and (suspected) exposure to covid-19: Secondary | ICD-10-CM | POA: Diagnosis not present

## 2020-08-19 DIAGNOSIS — R197 Diarrhea, unspecified: Secondary | ICD-10-CM | POA: Insufficient documentation

## 2020-08-19 DIAGNOSIS — R110 Nausea: Secondary | ICD-10-CM | POA: Diagnosis not present

## 2020-08-19 DIAGNOSIS — R519 Headache, unspecified: Secondary | ICD-10-CM | POA: Insufficient documentation

## 2020-08-19 DIAGNOSIS — Z79899 Other long term (current) drug therapy: Secondary | ICD-10-CM | POA: Insufficient documentation

## 2020-08-19 LAB — COMPREHENSIVE METABOLIC PANEL
ALT: 15 U/L (ref 0–44)
AST: 15 U/L (ref 15–41)
Albumin: 3.9 g/dL (ref 3.5–5.0)
Alkaline Phosphatase: 66 U/L (ref 38–126)
Anion gap: 10 (ref 5–15)
BUN: 12 mg/dL (ref 8–23)
CO2: 29 mmol/L (ref 22–32)
Calcium: 8.9 mg/dL (ref 8.9–10.3)
Chloride: 105 mmol/L (ref 98–111)
Creatinine, Ser: 0.56 mg/dL (ref 0.44–1.00)
GFR, Estimated: >60 mL/min (ref >60)
Glucose, Bld: 101 mg/dL — ABNORMAL HIGH (ref 70–99)
Potassium: 3.4 mmol/L — ABNORMAL LOW (ref 3.5–5.1)
Sodium: 144 mmol/L (ref 135–145)
Total Bilirubin: 1.1 mg/dL (ref 0.3–1.2)
Total Protein: 7 g/dL (ref 6.5–8.1)

## 2020-08-19 LAB — CBC WITH DIFFERENTIAL/PLATELET
Abs Immature Granulocytes: 0.02 10*3/uL (ref 0.00–0.07)
Basophils Absolute: 0.1 10*3/uL (ref 0.0–0.1)
Basophils Relative: 1 %
Eosinophils Absolute: 0.2 10*3/uL (ref 0.0–0.5)
Eosinophils Relative: 3 %
HCT: 40.4 % (ref 36.0–46.0)
Hemoglobin: 13.8 g/dL (ref 12.0–15.0)
Immature Granulocytes: 0 %
Lymphocytes Relative: 19 %
Lymphs Abs: 1.2 10*3/uL (ref 0.7–4.0)
MCH: 31 pg (ref 26.0–34.0)
MCHC: 34.2 g/dL (ref 30.0–36.0)
MCV: 90.8 fL (ref 80.0–100.0)
Monocytes Absolute: 0.7 10*3/uL (ref 0.1–1.0)
Monocytes Relative: 11 %
Neutro Abs: 4.2 10*3/uL (ref 1.7–7.7)
Neutrophils Relative %: 66 %
Platelets: 223 10*3/uL (ref 150–400)
RBC: 4.45 MIL/uL (ref 3.87–5.11)
RDW: 11.8 % (ref 11.5–15.5)
WBC: 6.4 10*3/uL (ref 4.0–10.5)
nRBC: 0 % (ref 0.0–0.2)

## 2020-08-19 LAB — URINE CULTURE
MICRO NUMBER:: 12191391
SPECIMEN QUALITY:: ADEQUATE

## 2020-08-19 LAB — RESP PANEL BY RT-PCR (FLU A&B, COVID) ARPGX2
Influenza A by PCR: NEGATIVE
Influenza B by PCR: NEGATIVE
SARS Coronavirus 2 by RT PCR: NEGATIVE

## 2020-08-19 MED ORDER — PROCHLORPERAZINE EDISYLATE 10 MG/2ML IJ SOLN
10.0000 mg | Freq: Once | INTRAMUSCULAR | Status: AC
  Administered 2020-08-19: 10 mg via INTRAVENOUS
  Filled 2020-08-19: qty 2

## 2020-08-19 MED ORDER — DIPHENHYDRAMINE HCL 50 MG/ML IJ SOLN
25.0000 mg | Freq: Once | INTRAMUSCULAR | Status: AC
  Administered 2020-08-19: 25 mg via INTRAVENOUS
  Filled 2020-08-19: qty 1

## 2020-08-19 MED ORDER — SODIUM CHLORIDE 0.9 % IV BOLUS
1000.0000 mL | Freq: Once | INTRAVENOUS | Status: AC
  Administered 2020-08-19: 1000 mL via INTRAVENOUS

## 2020-08-19 NOTE — ED Notes (Signed)
Patient transported to CT 

## 2020-08-19 NOTE — ED Provider Notes (Signed)
[facility] EMERGENCY DEPT Provider Note   CSN: [id number] Arrival date & time: 08/19/20  0948     History Chief Complaint  Patient presents with   Headache    Alicia [name] is a 65 y.o. female.  HPI     65yo female with history of hypertension, nephrolithiasis, osteoarthritis, presents with concern for headache.  Yesterday she was seen by PCP for lower abdominal pressure. Found to have severe hypertension, 192/11o, added losartan '50mg'$  daily to her chlorthalidone and follow up in 2-3 weeks.  Has not yet started the losartan.  Has had nausea and diarrhea this week as well as lower abdominal cramping.   Last night at 3AM woke with headache, reports it is severe 9/10. Has had severe headaches before but has never woken from sleep with a headache.  Does not have increased nausea with hte headache from what she was having earlier. No significant photophobia or phonophobia. Nothing seems to make it better or worse. Throbbing pain. No fever, no trauma, not on anticoagulation.  Denies numbness, weakness, difficulty talking or walking, visual changes or facial droop.  Reports feeling "foggy", denies feeling off balance.  Father had hx of AAA, no intracranial aneurysm hx.    Past Medical History:  Diagnosis Date   Allergy    Cataract    removed bilateral -2014   Chronic tension headaches    Hx of varicella    Hypertension    Kidney stones    Near syncope    Nephrolithiasis    history of  passed on own.    Palpitations    Thyroid disease    pt. denies 06/11/19   Tubular adenoma of colon 02/1997   w/ HGD   Varicose veins     Patient Active Problem List   Diagnosis Date Noted   Hypokalemia 01/25/2017   Thyroid disease    Palpitations    Nephrolithiasis    Near syncope    Kidney stones    Hypertension    Hx of varicella    Chronic tension headaches    Allergy    Nocturnal headaches 05/04/2016   Coccygodynia 01/29/2015   Atypical chest pain 09/09/2014    Essential hypertension 09/09/2014   Midline low back pain without sciatica 09/05/2013   Renal cyst 09/05/2013   Fatty infiltration of liver [id number]   Umbilical hernia without obstruction and without gangrene 09/05/2013   Flank pain 07/29/2013   Encounter to establish care with new doctor 07/11/2013   Left leg pain 07/11/2013   Cough 01/22/2013   Swelling in head/neck 10/31/2012   Routine health maintenance 04/05/2012   Unspecified vitamin D deficiency 04/04/2012   Varicose veins of lower extremities with other complications [id number]   Chronic venous insufficiency 09/02/2011   Allergic rhinitis, cause unspecified 06/08/2011   Paresthesia of left arm 06/08/2011   Essential hypertension, benign 03/26/2010   OBESITY, CLASS III 10/02/2009   Other and unspecified coagulation defects 04/03/2009   PERIPHERAL EDEMA 10/22/2008   HEMORRHOIDS, INTERNAL, WITH BLEEDING 05/07/2008   NEPHROLITHIASIS, HX OF 04/28/2008   SYNCOPE 04/19/2007   ANXIETY DEPRESSION 10/31/2006   HYSTERECTOMY, VAGINAL, HX OF 10/31/2006    Past Surgical History:  Procedure Laterality Date   ABDOMINAL HYSTERECTOMY     fibroids non cancer    CATARACT EXTRACTION     Bilateral   cataract surgery Right 03-02-2012   CHOLECYSTECTOMY     ROTATOR CUFF REPAIR Right    TONSILLECTOMY       OB History  Gravida  3   Para  3   Term  3   Preterm      AB      Living         SAB      IAB      Ectopic      Multiple      Live Births              Family History  Problem Relation Age of Onset   Cancer Mother        CERVICAL uterin cancer    Liver disease Mother        from chronic heaptitis    Diabetes Mother    Heart disease Father        avdisease avr   Stroke Father    Prostate cancer Father    Cancer Sister        Ovarian and endometrial   Cancer Maternal Aunt        Colon   Heart disease Maternal Grandfather    Heart disease Paternal Grandmother    Heart disease Paternal  Grandfather    Diabetes Sister    Diabetes Brother    Colon cancer Paternal Aunt    Esophageal cancer Paternal Uncle    Rectal cancer Neg Hx    Stomach cancer Neg Hx     Social History   Tobacco Use   Smoking status: Never   Smokeless tobacco: Never  Vaping Use   Vaping Use: Never used  Substance Use Topics   Alcohol use: No   Drug use: No    Home Medications Prior to Admission medications   Medication Sig Start Date End Date Taking? Authorizing Provider  acetaminophen (TYLENOL) 325 MG tablet Take 650 mg by mouth every 6 (six) hours as needed for mild pain.   Yes [provider]  chlorthalidone (HYGROTON) 50 MG tablet Take 1 tablet (50 mg total) by mouth daily. 12/16/19  Yes Fay Records, MD  Multiple Vitamin (MULTIVITAMIN WITH MINERALS) TABS tablet Take 1 tablet by mouth daily. Women's formula   Yes [provider]  losartan (COZAAR) 50 MG tablet Take 1 tablet (50 mg total) by mouth daily. 08/18/20   Burchette, Alinda Sierras, MD  potassium chloride (KLOR-CON) 10 MEQ tablet Take 1 tablet (10 mEq total) by mouth daily. 12/16/19   Fay Records, MD    Allergies    Ciprofloxacin hcl, Iodinated diagnostic agents, Iohexol, Lidocaine hcl, Morphine, Sulfa antibiotics, Prednisone, Valsartan, Shellfish allergy, Ciprofloxacin, Penicillins, Sulfamethoxazole-trimethoprim, and Sulfonamide derivatives  Review of Systems   Review of Systems  Constitutional:  Negative for fever.  Eyes:  Negative for visual disturbance.  Respiratory:  Negative for cough and shortness of breath.   Cardiovascular:  Negative for chest pain.  Gastrointestinal:  Positive for diarrhea and nausea. Negative for abdominal pain and vomiting.  Genitourinary:  Negative for difficulty urinating.  Musculoskeletal:  Negative for back pain and neck pain.  Skin:  Negative for rash.  Neurological:  Positive for headaches. Negative for dizziness, syncope, facial asymmetry, speech difficulty, weakness and numbness.    Physical Exam Updated Vital Signs BP (!) 172/83 (BP Location: Right Arm)   Pulse 65   Temp 98.6 F (37 C) (Oral)   Resp 12   SpO2 97%   Physical Exam Constitutional:      General: She is not in acute distress.    Appearance: Normal appearance. She is not ill-appearing.  HENT:     Head: Normocephalic  and atraumatic.  Eyes:     General: No visual field deficit.    Extraocular Movements: Extraocular movements intact.     Conjunctiva/sclera: Conjunctivae normal.     Pupils: Pupils are equal, round, and reactive to light.  Cardiovascular:     Rate and Rhythm: Normal rate and regular rhythm.     Pulses: Normal pulses.  Pulmonary:     Effort: Pulmonary effort is normal. No respiratory distress.  Musculoskeletal:        General: No swelling or tenderness.     Cervical back: Normal range of motion.  Skin:    General: Skin is warm and dry.     Findings: No erythema or rash.  Neurological:     General: No focal deficit present.     Mental Status: She is alert and oriented to person, place, and time.     GCS: GCS eye subscore is 4. GCS verbal subscore is 5. GCS motor subscore is 6.     Cranial Nerves: No cranial nerve deficit, dysarthria or facial asymmetry.     Sensory: No sensory deficit.     Motor: No weakness or tremor.     Coordination: Coordination normal. Finger-Nose-Finger Test normal.     Gait: Gait normal.    ED Results / Procedures / Treatments   Labs (all labs ordered are listed, but only abnormal results are displayed) Labs Reviewed  RESP PANEL BY RT-PCR (FLU A&B, COVID) [id number]  CBC WITH DIFFERENTIAL/PLATELET  COMPREHENSIVE METABOLIC PANEL    EKG EKG Interpretation  Date/Time:  Wednesday August 19 2020 09:58:09 EDT Ventricular Rate:  56 PR Interval:  143 QRS Duration: 123 QT Interval:  472 QTC Calculation: 456 R Axis:   46 Text Interpretation: Sinus rhythm Nonspecific intraventricular conduction delay Confirmed by Gareth Morgan [phone]) on 08/19/2020  10:05:55 AM  Radiology No results found.  Procedures Procedures   Medications Ordered in ED Medications  sodium chloride 0.9 % bolus 1,000 mL (has no administration in time range)  prochlorperazine (COMPAZINE) injection 10 mg (has no administration in time range)  diphenhydrAMINE (BENADRYL) injection 25 mg (has no administration in time range)    ED Course  I have reviewed the triage vital signs and the nursing notes.  Pertinent labs & imaging results that were available during my care of the patient were reviewed by me and considered in my medical decision making (see chart for details).    MDM Rules/Calculators/A&P                            65yo female with history of hypertension, nephrolithiasis, osteoarthritis, presents with concern for headache.  Also reports nausea, diarrhea, labs completed show no significant abnormalities. Hx not consistent with meningitis, no signs of CVA on history or exam.   CT head shows no acute abnormalities.  Headache improved. Has had headaches in the past,  low suspicion for occult SAH at this time.   Given benadryl, compazine, fluids with improvement.  Suspect viral syndrome or dehydration in setting of nausea/diarrhea leading to headaches. Patient discharged in stable condition with understanding of reasons to return.    Final Clinical Impression(s) / ED Diagnoses Final diagnoses:  Acute nonintractable headache, unspecified headache type    Rx / DC Orders ED Discharge Orders     None        Gareth Morgan, MD 08/19/20 2300

## 2020-08-19 NOTE — ED Triage Notes (Signed)
Pt was awaken with a headache at 0300. Pt has not had a headache like this before. Pt denis N/V or any vision problems. Pt stated that she did see her PCP yesterday and leaned  that she hypertension had increased. PCP prescribed new medication for same but she has not taken them yet. Pt describes dizziness as more lightheadedness but states she has no problems walking.

## 2020-08-31 ENCOUNTER — Telehealth: Payer: Self-pay | Admitting: Gastroenterology

## 2020-08-31 ENCOUNTER — Telehealth: Payer: Self-pay

## 2020-08-31 NOTE — Telephone Encounter (Signed)
Called patient and let her know the soonest opening we have  with any provider is 09/23/20 and that I have put her on the wait list. She choose to keep her current office appt. on 09/30/20. I asked her to please go to urgent care or the ER if her symptoms worsen.

## 2020-08-31 NOTE — Telephone Encounter (Signed)
Pt is requesting a sooner appt than 09/30/20. She stated that she has been experiencing diarrhea since 2 weeks ago but it has gotten worse. Pt reports to have developed pain on the right side of her navel. Pls call her.

## 2020-09-03 ENCOUNTER — Telehealth: Payer: Federal, State, Local not specified - PPO | Admitting: Internal Medicine

## 2020-09-07 ENCOUNTER — Encounter: Payer: Self-pay | Admitting: Gastroenterology

## 2020-09-07 ENCOUNTER — Ambulatory Visit: Payer: Federal, State, Local not specified - PPO | Admitting: Gastroenterology

## 2020-09-07 VITALS — BP 134/80 | HR 62 | Ht 66.0 in | Wt 253.2 lb

## 2020-09-07 DIAGNOSIS — K580 Irritable bowel syndrome with diarrhea: Secondary | ICD-10-CM | POA: Diagnosis not present

## 2020-09-07 DIAGNOSIS — Z86010 Personal history of colonic polyps: Secondary | ICD-10-CM

## 2020-09-07 DIAGNOSIS — R1031 Right lower quadrant pain: Secondary | ICD-10-CM

## 2020-09-07 MED ORDER — DICYCLOMINE HCL 10 MG PO CAPS
10.0000 mg | ORAL_CAPSULE | Freq: Three times a day (TID) | ORAL | 11 refills | Status: DC
Start: 2020-09-07 — End: 2021-04-04

## 2020-09-07 NOTE — Progress Notes (Signed)
    History of Present Illness: This is a 65 year old female who relates worsening problems with right lower quadrant pain.  See May 28, 2019 office note.  Since increasing her exercise routine she has noted an increase in right lower quadrant pain.  She also notes increased right lower quadrant pain following meals associated with urgent bowel movements.  She relates a few episodes of syncope, felt to be vasovagal, following postprandial right lower quadrant abdominal pain.  She generally has 3-4 urgent bowel movements daily and this pattern has been present for years. CBC, CMP in early August were unremarkable. Denies weight loss, constipation, change in stool caliber, melena, hematochezia, nausea, vomiting, dysphagia, reflux symptoms, chest pain.   Colonoscopy 05/2019 - External hemorrhoids, hemorrhoid tags found on perianal exam. - Severe diverticulosis thoughout the colon. - Grade II internal hemorrhoids. - Otherwise normal appearing colonoscopy. - No specimens collected.   Current Medications, Allergies, Past Medical History, Past Surgical History, Family History and Social History were reviewed in [employer] record.   Physical Exam: General: Well developed, well nourished, no acute distress Head: Normocephalic and atraumatic Eyes: Sclerae anicteric, EOMI Ears: Normal auditory acuity Mouth: Not examined, mask on during Covid-19 pandemic Lungs: Clear throughout to auscultation Heart: Regular rate and rhythm; no murmurs, rubs or bruits Abdomen: Soft, non tender and non distended. No masses, hepatosplenomegaly or hernias noted. Normal Bowel sounds Rectal: Not done  Musculoskeletal: Symmetrical with no gross deformities  Pulses:  Normal pulses noted Extremities: No clubbing, cyanosis, edema or deformities noted Neurological: Alert oriented x 4, grossly nonfocal Psychological:  Alert and cooperative. Anxious.    Assessment and Recommendations:  RLQ pain  related to IBS-D and musculoskeletal pain. R/O other causes.  Schedule CT AP with oral contrast and without IV contrast due to history of anaphylaxis.  Begin dicyclomine 10 mg p.o. 3 times daily AC.  If symptoms are not adequately relieved trial of IBgard 1-2 p.o. 3 times daily AC. Diverticulosis.  Higher fiber diet with adequate daily water intake.  Personal history of adenomatous colon polyps.  Surveillance colonoscopy recommended May 2028.                                                                                                                                          `

## 2020-09-07 NOTE — Patient Instructions (Signed)
We have sent the following medications to your pharmacy for you to pick up at your convenience: dicyclomine. If this does not help then take over the counter IBgard 1-2 capsules three times a day with meals.   You have been scheduled for a CT scan of the abdomen and pelvis at [facility] ([address]---this is in the same building as [employer]).   You are scheduled on 09/09/20 at 9:15am. You should arrive 15 minutes prior to your appointment time for registration. Please follow the written instructions below on the day of your exam:  WARNING: IF YOU ARE ALLERGIC TO IODINE/X-RAY DYE, PLEASE NOTIFY RADIOLOGY IMMEDIATELY AT [phone]! YOU WILL BE GIVEN A 13 HOUR PREMEDICATION PREP.  1) Do not eat anything after 4:15am (4 hours prior to your test) 2) You have been given 2 bottles of oral contrast to drink. The solution may taste better if refrigerated, but do NOT add ice or any other liquid to this solution. Shake well before drinking.    Drink 1 bottle of contrast @ 7:15am (2 hours prior to your exam)  Drink 1 bottle of contrast @ 8:15am (1 hour prior to your exam)  You may take any medications as prescribed with a small amount of water, if necessary. If you take any of the following medications: METFORMIN, GLUCOPHAGE, GLUCOVANCE, AVANDAMET, RIOMET, FORTAMET, [place] MET, JANUMET, GLUMETZA or METAGLIP, you MAY be asked to HOLD this medication 48 hours AFTER the exam.  The purpose of you drinking the oral contrast is to aid in the visualization of your intestinal tract. The contrast solution may cause some diarrhea. Depending on your individual set of symptoms, you may also receive an intravenous injection of x-ray contrast/dye. Plan on being at [facility] for 30 minutes or longer, depending on the type of exam you are having performed.  This test typically takes 30-45 minutes to complete.  If you have any questions regarding your exam or if you need to  reschedule, you may call the CT department at [phone] between the hours of 8:00 am and 5:00 pm, Monday-Friday.  ________________________________________________________________________  Due to recent changes in healthcare laws, you may see the results of your imaging and laboratory studies on MyChart before your provider has had a chance to review them.  We understand that in some cases there may be results that are confusing or concerning to you. Not all laboratory results come back in the same time frame and the provider may be waiting for multiple results in order to interpret others.  Please give [place] 48 hours in order for your provider to thoroughly review all the results before contacting the office for clarification of your results.   The [facility] providers would like to encourage you to use [facility] to communicate with providers for non-urgent requests or questions.  Due to long hold times on the telephone, sending your provider a message by [facility] may be a faster and more efficient way to get a response.  Please allow 48 business hours for a response.  Please remember that this is for non-urgent requests.   Thank you for choosing me and [facility].  Pricilla Riffle. Dagoberto Ligas., MD., [facility]

## 2020-09-09 ENCOUNTER — Other Ambulatory Visit: Payer: Self-pay

## 2020-09-09 ENCOUNTER — Ambulatory Visit (INDEPENDENT_AMBULATORY_CARE_PROVIDER_SITE_OTHER)
Admission: RE | Admit: 2020-09-09 | Discharge: 2020-09-09 | Disposition: A | Discharge status: Home / Self Care | Payer: Federal, State, Local not specified - PPO | Source: Ambulatory Visit | Attending: Gastroenterology | Admitting: Gastroenterology

## 2020-09-09 DIAGNOSIS — K429 Umbilical hernia without obstruction or gangrene: Secondary | ICD-10-CM | POA: Diagnosis not present

## 2020-09-09 DIAGNOSIS — R1031 Right lower quadrant pain: Secondary | ICD-10-CM | POA: Diagnosis not present

## 2020-09-09 DIAGNOSIS — N83201 Unspecified ovarian cyst, right side: Secondary | ICD-10-CM | POA: Diagnosis not present

## 2020-09-09 DIAGNOSIS — K5730 Diverticulosis of large intestine without perforation or abscess without bleeding: Secondary | ICD-10-CM | POA: Diagnosis not present

## 2020-09-09 DIAGNOSIS — Z87828 Personal history of other (healed) physical injury and trauma: Secondary | ICD-10-CM | POA: Diagnosis not present

## 2020-09-30 ENCOUNTER — Ambulatory Visit: Payer: Federal, State, Local not specified - PPO | Admitting: Gastroenterology

## 2020-10-27 DIAGNOSIS — N83201 Unspecified ovarian cyst, right side: Secondary | ICD-10-CM | POA: Diagnosis not present

## 2020-10-27 DIAGNOSIS — N83209 Unspecified ovarian cyst, unspecified side: Secondary | ICD-10-CM | POA: Diagnosis not present

## 2020-11-12 DIAGNOSIS — N9489 Other specified conditions associated with female genital organs and menstrual cycle: Secondary | ICD-10-CM | POA: Diagnosis not present

## 2020-11-30 NOTE — Progress Notes (Signed)
Cardiology Office Note   Date:  12/01/2020   ID:  Alicia [name], DOB Nov 26, 1955, MRN [id number]  PCP:  Burnis Medin, MD  Cardiologist:   Dorris Carnes, MD   Pt presents for f/u of CP and HTN   History of Present Illness: Alicia [name] is a 65 y.o. female with a history of CP(atypical), palpitation  Echo in 2018 showed LVEF normal  Mild diastolic dysfunction I last sae the pt in the Fall 2021 Since seen she has retired   She denies CP  Breathing is OK  She says that since she has been retired her BP has been better    She is eating better   Not snacking as much    Br:  cereal, egg, toast, oatmeal   Often skips Lunch   Soup or yogurt Dinner;  Pasta   Chicken Snack  Pretzels,  Drinks   Unsweet tea, coffe  She is working out at [employer] well Does water activities    Current Outpatient Medications  Medication Sig Dispense Refill   acetaminophen (TYLENOL) 325 MG tablet Take 650 mg by mouth every 6 (six) hours as needed for mild pain.     chlorthalidone (HYGROTON) 50 MG tablet Take 1 tablet (50 mg total) by mouth daily. 90 tablet 3   dicyclomine (BENTYL) 10 MG capsule Take 1 capsule (10 mg total) by mouth 3 (three) times daily before meals. 90 capsule 11   Multiple Vitamin (MULTIVITAMIN WITH MINERALS) TABS tablet Take 1 tablet by mouth daily. Women's formula     potassium chloride (KLOR-CON) 10 MEQ tablet Take 1 tablet (10 mEq total) by mouth daily. 90 tablet 3   No current facility-administered medications for this visit.    Allergies:   Ciprofloxacin hcl, Iodinated diagnostic agents, Iohexol, Lidocaine hcl, Morphine, Sulfa antibiotics, Prednisone, Valsartan, Shellfish allergy, Ciprofloxacin, Penicillins, Sulfamethoxazole-trimethoprim, and Sulfonamide derivatives   Past Medical History:  Diagnosis Date   Allergy    Cataract    removed bilateral -2014   Chronic tension headaches    Hx of varicella    Hypertension    Kidney stones    Near syncope    Nephrolithiasis     history of  passed on own.    Palpitations    Thyroid disease    pt. denies 06/11/19   Tubular adenoma of colon 02/1997   w/ HGD   Varicose veins     Past Surgical History:  Procedure Laterality Date   ABDOMINAL HYSTERECTOMY     fibroids non cancer    CATARACT EXTRACTION     Bilateral   cataract surgery Right 03/02/2012   CHOLECYSTECTOMY     KNEE SURGERY  04/2020   ROTATOR CUFF REPAIR Right    TONSILLECTOMY       Social History:  The patient  reports that she has never smoked. She has never used smokeless tobacco. She reports that she does not drink alcohol and does not use drugs.   Family History:  The patient's family history includes Cancer in her maternal aunt, mother, and sister; Colon cancer in her paternal aunt; Diabetes in her brother, mother, and sister; Esophageal cancer in her paternal uncle; Heart disease in her father, maternal grandfather, paternal grandfather, and paternal grandmother; Liver disease in her mother; Prostate cancer in her father; Stroke in her father.    ROS:  Please see the history of present illness. All other systems are reviewed and  Negative to the above problem except as noted.  PHYSICAL EXAM: VS:  BP 120/78   Pulse 61   Ht 5\' 6"  (1.676 m)   Wt 258 lb 9.6 oz (117.3 kg)   SpO2 97%   BMI 41.74 kg/m   GEN:  Morbidly obese 65 yo  in no acute distress  HEENT: normal  Neck:  JVP is normal    No  carotid bruits, Cardiac: RRR; No signif murmur Noedema in legs  Respiratory:  clear to auscultation bilaterally,  GI: soft, nontender, nondistended, + BS  No hepatomegaly  MS: no deformity Moving all extremities   Skin: warm and dry, no rash  EKG:  EKG is  not ordered today.     Lipid Panel    Component Value Date/Time   CHOL 201 (H) 12/01/2020 1159   TRIG 128 12/01/2020 1159   HDL 68 12/01/2020 1159   CHOLHDL 3.0 12/01/2020 1159   CHOLHDL 3 07/17/2015 1500   VLDL 27.8 07/17/2015 1500   LDLCALC 111 (H) 12/01/2020 1159      Wt  Readings from Last 3 Encounters:  12/01/20 258 lb 9.6 oz (117.3 kg)  09/07/20 253 lb 4 oz (114.9 kg)  08/18/20 259 lb 1.6 oz (117.5 kg)      ASSESSMENT AND PLAN:   1 HTN  BP is good   COntinue to follow  Bettersince I saw her last  2  Lipdis   LDL is OK at 103  I have reviewed her CT scan in the past (abdomen)   No signif calcifications  3  CP  Denies    4   Hepatic steatosis  Reviewed scan with her   Would recomm limitiing carbs, sugars  She has started to do this   I would increase  discussed TRE   SHe is already doing this some  Get rid of crackers as snacks  Will check labs today    Tentative follow up in 1 year Stay active      Current medicines are reviewed at length with the patient today.  The patient does not have concerns regarding medicines.  F/U with me as needed if symptoms change   Signed, Dorris Carnes, MD  12/01/2020 6:25 PM    [address], [place], [place]  [address] Phone: [phone]; Fax: [phone]

## 2020-12-01 ENCOUNTER — Ambulatory Visit: Payer: Federal, State, Local not specified - PPO | Admitting: Internal Medicine

## 2020-12-01 ENCOUNTER — Encounter: Payer: Self-pay | Admitting: Internal Medicine

## 2020-12-01 ENCOUNTER — Other Ambulatory Visit: Payer: Self-pay

## 2020-12-01 VITALS — BP 120/78 | HR 61 | Ht 66.0 in | Wt 258.6 lb

## 2020-12-01 DIAGNOSIS — I872 Venous insufficiency (chronic) (peripheral): Secondary | ICD-10-CM

## 2020-12-01 DIAGNOSIS — E782 Mixed hyperlipidemia: Secondary | ICD-10-CM | POA: Diagnosis not present

## 2020-12-01 DIAGNOSIS — I10 Essential (primary) hypertension: Secondary | ICD-10-CM

## 2020-12-01 LAB — BASIC METABOLIC PANEL
BUN/Creatinine Ratio: 23 (ref 12–28)
BUN: 14 mg/dL (ref 8–27)
CO2: 29 mmol/L (ref 20–29)
Calcium: 9.3 mg/dL (ref 8.7–10.3)
Chloride: 99 mmol/L (ref 96–106)
Creatinine, Ser: 0.61 mg/dL (ref 0.57–1.00)
Glucose: 76 mg/dL (ref 70–99)
Potassium: 3.2 mmol/L — ABNORMAL LOW (ref 3.5–5.2)
Sodium: 142 mmol/L (ref 134–144)
eGFR: 100 mL/min/{1.73_m2} (ref >59)

## 2020-12-01 LAB — CBC
Hematocrit: 39.9 % (ref 34.0–46.6)
Hemoglobin: 13.9 g/dL (ref 11.1–15.9)
MCH: 31 pg (ref 26.6–33.0)
MCHC: 34.8 g/dL (ref 31.5–35.7)
MCV: 89 fL (ref 79–97)
Platelets: 227 10*3/uL (ref 150–450)
RBC: 4.48 x10E6/uL (ref 3.77–5.28)
RDW: 11.6 % — ABNORMAL LOW (ref 11.7–15.4)
WBC: 6.4 10*3/uL (ref 3.4–10.8)

## 2020-12-01 LAB — LIPID PANEL
Chol/HDL Ratio: 3 ratio (ref 0.0–4.4)
Cholesterol, Total: 201 mg/dL — ABNORMAL HIGH (ref 100–199)
HDL: 68 mg/dL (ref >39)
LDL Chol Calc (NIH): 111 mg/dL — ABNORMAL HIGH (ref 0–99)
Triglycerides: 128 mg/dL (ref 0–149)
VLDL Cholesterol Cal: 22 mg/dL (ref 5–40)

## 2020-12-01 LAB — TSH: TSH: 2.56 u[IU]/mL (ref 0.450–4.500)

## 2020-12-01 LAB — HEMOGLOBIN A1C
Est. average glucose Bld gHb Est-mCnc: 128 mg/dL
Hgb A1c MFr Bld: 6.1 % — ABNORMAL HIGH (ref 4.8–5.6)

## 2020-12-01 NOTE — Patient Instructions (Addendum)
Medication Instructions:  NO CHANGES *If you need a refill on your cardiac medications before your next appointment, please call your pharmacy*   Lab Work: TODAY BMET CBC LIPID A1C AND TSH If you have labs (blood work) drawn today and your tests are completely normal, you will receive your results only by: [place] (if you have MyChart) OR A paper copy in the mail If you have any lab test that is abnormal or we need to change your treatment, we will call you to review the results.   Testing/Procedures: NONE   Follow-Up: At [facility], you and your health needs are our priority.  As part of our continuing mission to provide you with exceptional heart care, we have created designated Provider Care Teams.  These Care Teams include your primary Cardiologist (physician) and Advanced Practice Providers (APPs -  Physician Assistants and Nurse Practitioners) who all work together to provide you with the care you need, when you need it.  We recommend signing up for the patient portal called "MyChart".  Sign up information is provided on this After Visit Summary.  MyChart is used to connect with patients for Virtual Visits (Telemedicine).  Patients are able to view lab/test results, encounter notes, upcoming appointments, etc.  Non-urgent messages can be sent to your provider as well.   To learn more about what you can do with MyChart, go to [email or web address].    Your next appointment:   YEARLY   The format for your next appointment:   In Person  Provider:   Dorris Carnes, MD     Other Instructions NONE

## 2020-12-28 DIAGNOSIS — I8393 Asymptomatic varicose veins of bilateral lower extremities: Secondary | ICD-10-CM | POA: Diagnosis not present

## 2021-02-03 DIAGNOSIS — L820 Inflamed seborrheic keratosis: Secondary | ICD-10-CM | POA: Diagnosis not present

## 2021-02-03 DIAGNOSIS — L570 Actinic keratosis: Secondary | ICD-10-CM | POA: Diagnosis not present

## 2021-03-08 ENCOUNTER — Telehealth: Payer: Self-pay | Admitting: Internal Medicine

## 2021-03-08 NOTE — Telephone Encounter (Signed)
usually want  reg vaccine utd including tdap    if [place] ,   refer her to the [employer] site for travel information  where she can get advice for specific locations .

## 2021-03-08 NOTE — Telephone Encounter (Signed)
Pt call and want to know is what shot she will need to go to [place] and want a call back.

## 2021-03-09 NOTE — Telephone Encounter (Signed)
Pt informed of the message below and verbalized understanding. 

## 2021-04-04 ENCOUNTER — Emergency Department (HOSPITAL_BASED_OUTPATIENT_CLINIC_OR_DEPARTMENT_OTHER): Payer: Medicare Other

## 2021-04-04 ENCOUNTER — Emergency Department (HOSPITAL_BASED_OUTPATIENT_CLINIC_OR_DEPARTMENT_OTHER)
Admission: EM | Admit: 2021-04-04 | Discharge: 2021-04-04 | Disposition: A | Discharge status: Home / Self Care | Payer: Medicare Other | Attending: Emergency Medicine | Admitting: Emergency Medicine

## 2021-04-04 ENCOUNTER — Encounter (HOSPITAL_BASED_OUTPATIENT_CLINIC_OR_DEPARTMENT_OTHER): Payer: Self-pay | Admitting: *Deleted

## 2021-04-04 ENCOUNTER — Other Ambulatory Visit: Payer: Self-pay

## 2021-04-04 DIAGNOSIS — J029 Acute pharyngitis, unspecified: Secondary | ICD-10-CM | POA: Diagnosis not present

## 2021-04-04 DIAGNOSIS — Z87442 Personal history of urinary calculi: Secondary | ICD-10-CM | POA: Insufficient documentation

## 2021-04-04 DIAGNOSIS — I10 Essential (primary) hypertension: Secondary | ICD-10-CM | POA: Insufficient documentation

## 2021-04-04 DIAGNOSIS — R0602 Shortness of breath: Secondary | ICD-10-CM | POA: Diagnosis not present

## 2021-04-04 DIAGNOSIS — U071 COVID-19: Secondary | ICD-10-CM | POA: Diagnosis not present

## 2021-04-04 DIAGNOSIS — R509 Fever, unspecified: Secondary | ICD-10-CM | POA: Diagnosis not present

## 2021-04-04 LAB — COMPREHENSIVE METABOLIC PANEL
ALT: 32 U/L (ref 0–44)
AST: 38 U/L (ref 15–41)
Albumin: 4.2 g/dL (ref 3.5–5.0)
Alkaline Phosphatase: 55 U/L (ref 38–126)
Anion gap: 11 (ref 5–15)
BUN: 15 mg/dL (ref 8–23)
CO2: 27 mmol/L (ref 22–32)
Calcium: 9.3 mg/dL (ref 8.9–10.3)
Chloride: 99 mmol/L (ref 98–111)
Creatinine, Ser: 0.74 mg/dL (ref 0.44–1.00)
GFR, Estimated: >60 mL/min (ref >60)
Glucose, Bld: 121 mg/dL — ABNORMAL HIGH (ref 70–99)
Potassium: 3.6 mmol/L (ref 3.5–5.1)
Sodium: 137 mmol/L (ref 135–145)
Total Bilirubin: 1 mg/dL (ref 0.3–1.2)
Total Protein: 7.6 g/dL (ref 6.5–8.1)

## 2021-04-04 LAB — CBC
HCT: 43.4 % (ref 36.0–46.0)
Hemoglobin: 14.6 g/dL (ref 12.0–15.0)
MCH: 30.9 pg (ref 26.0–34.0)
MCHC: 33.6 g/dL (ref 30.0–36.0)
MCV: 91.9 fL (ref 80.0–100.0)
Platelets: 155 10*3/uL (ref 150–400)
RBC: 4.72 MIL/uL (ref 3.87–5.11)
RDW: 12.8 % (ref 11.5–15.5)
WBC: 6.3 10*3/uL (ref 4.0–10.5)
nRBC: 0 % (ref 0.0–0.2)

## 2021-04-04 MED ORDER — DEXAMETHASONE 4 MG PO TABS
10.0000 mg | ORAL_TABLET | Freq: Once | ORAL | Status: AC
  Administered 2021-04-04: 10 mg via ORAL
  Filled 2021-04-04: qty 3

## 2021-04-04 MED ORDER — SODIUM CHLORIDE 0.9 % IV BOLUS
1000.0000 mL | Freq: Once | INTRAVENOUS | Status: AC
  Administered 2021-04-04: 1000 mL via INTRAVENOUS

## 2021-04-04 MED ORDER — ONDANSETRON HCL 4 MG/2ML IJ SOLN
4.0000 mg | Freq: Once | INTRAMUSCULAR | Status: AC
  Administered 2021-04-04: 4 mg via INTRAVENOUS
  Filled 2021-04-04: qty 2

## 2021-04-04 MED ORDER — ONDANSETRON 4 MG PO TBDP: 4.0000 mg | ORAL_TABLET | Freq: Three times a day (TID) | ORAL | 0 refills | Status: DC | PRN

## 2021-04-04 MED ORDER — KETOROLAC TROMETHAMINE 15 MG/ML IJ SOLN
15.0000 mg | Freq: Once | INTRAMUSCULAR | Status: AC
  Administered 2021-04-04: 15 mg via INTRAVENOUS
  Filled 2021-04-04: qty 1

## 2021-04-04 NOTE — Discharge Instructions (Signed)
You were evaluated in the Emergency Department and after careful evaluation, we did not find any emergent condition requiring admission or further testing in the hospital. ? ?Your exam/testing today is overall reassuring.  Symptoms seem well explained to COVID-19.  Recommend Tylenol and Motrin at home for discomfort.  Can use the Zofran medication as needed for nausea.  Drink plenty of fluids, get plenty of rest. ? ?Please return to the Emergency Department if you experience any worsening of your condition.   Thank you for allowing [place] to be a part of your care. ?

## 2021-04-04 NOTE — ED Provider Notes (Signed)
?[facility] ?[facility] Emergency Department ?Provider Note ?MRN:  [id number]  ?Arrival date & time: 04/04/21    ? ?Chief Complaint   ?Shortness of Breath ?  ?History of Present Illness   ?Alicia [name] is a 66 y.o. year-old female with no pertinent past medical history presenting to the ED with chief complaint of shortness of breath. ? ?Mild shortness of breath, body aches, malaise, fatigue, fevers, cough, nausea, poor p.o. intake.  Tested positive for COVID-19 at home a few days ago.  Has had symptoms for 4 days.  Recent travel to [place]. ? ?Review of Systems  ?A thorough review of systems was obtained and all systems are negative except as noted in the HPI and PMH.  ? ?Patient's Health History   ? ?Past Medical History:  ?Diagnosis Date  ? Allergy   ? Cataract   ? removed bilateral -2014  ? Chronic tension headaches   ? Hx of varicella   ? Hypertension   ? Kidney stones   ? Near syncope   ? Nephrolithiasis   ? history of  passed on own.   ? Palpitations   ? Thyroid disease   ? pt. denies 06/11/19  ? Tubular adenoma of colon 02/1997  ? w/ HGD  ? Varicose veins   ?  ?Past Surgical History:  ?Procedure Laterality Date  ? ABDOMINAL HYSTERECTOMY    ? fibroids non cancer   ? CATARACT EXTRACTION    ? Bilateral  ? cataract surgery Right 03/02/2012  ? CHOLECYSTECTOMY    ? KNEE SURGERY  04/2020  ? KNEE SURGERY    ? ROTATOR CUFF REPAIR Right   ? TONSILLECTOMY    ?  ?Family History  ?Problem Relation Age of Onset  ? Cancer Mother   ?     CERVICAL uterin cancer   ? Liver disease Mother   ?     from chronic heaptitis   ? Diabetes Mother   ? Heart disease Father   ?     avdisease avr  ? Stroke Father   ? Prostate cancer Father   ? Cancer Sister   ?     Ovarian and endometrial  ? Cancer Maternal Aunt   ?     Colon  ? Heart disease Maternal Grandfather   ? Heart disease Paternal Grandmother   ? Heart disease Paternal Grandfather   ? Diabetes Sister   ? Diabetes Brother   ? Colon cancer Paternal Aunt   ?  Esophageal cancer Paternal Uncle   ? Rectal cancer Neg Hx   ? Stomach cancer Neg Hx   ?  ?Social History  ? ?Socioeconomic History  ? Marital status: Married  ?  Spouse name: Not on file  ? Number of children: 3  ? Years of education: 39  ? Highest education level: Not on file  ?Occupational History  ? Occupation: Pharmacist, hospital  ?  Employer: [employer]  ?Tobacco Use  ? Smoking status: Never  ? Smokeless tobacco: Never  ?Vaping Use  ? Vaping Use: Never used  ?Substance and Sexual Activity  ? Alcohol use: No  ? Drug use: No  ? Sexual activity: Yes  ?  Partners: Male  ?Other Topics Concern  ? Not on file  ?Social History Narrative  ? [employer]- BA. 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 2 daughters- '82, '93, 1 son- '87. 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 SO- [employer]  ?   ? 7-8 HOURS OF SLEEP PER NIGHT  ? 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 Lives with her husband  ? 2 dogs  ? orig from [place]  ? G3P3  ? Currently ob FMLA for dad awaiting valve surgery  ? ?Social Determinants of Health  ? ?Financial Resource Strain: Not on file  ?Food Insecurity: Not on file  ?Transportation Needs: Not on file  ?Physical Activity: Not on file  ?Stress: Not on file  ?Social Connections: Not on file  ?Intimate Partner Violence: Not on file  ?  ? ?Physical Exam  ? ?Vitals:  ? 04/04/21 0454 04/04/21 0519  ?BP:  114/62  ?Pulse: 73 65  ?Resp: 15 16  ?Temp:    ?SpO2: 97% 97%  ?  ?CONSTITUTIONAL: Well-appearing, NAD ?NEURO/PSYCH:  Alert and oriented x 3, no focal deficits ?EYES:  eyes equal and reactive ?ENT/NECK:  no LAD, no JVD ?CARDIO: Regular rate, well-perfused, normal S1 and S2 ?PULM:  CTAB no wheezing or rhonchi ?GI/GU:  non-distended, non-tender ?MSK/SPINE:  No gross deformities, no edema ?SKIN:  no rash, atraumatic ? ? ?*Additional and/or pertinent findings included in MDM below ? ?Diagnostic and Interventional Summary  ? ? EKG Interpretation ? ?Date/Time:    ?Ventricular Rate:    ?PR Interval:    ?QRS  Duration:   ?QT Interval:    ?QTC Calculation:   ?R Axis:     ?Text Interpretation:   ?  ? ?  ? ?Labs Reviewed  ?COMPREHENSIVE METABOLIC PANEL - Abnormal; Notable for the following components:  ?    Result Value  ? Glucose, Bld 121 (*)   ? All other components within normal limits  ?CBC  ?  ?DG Chest Port 1 View  ?Final Result  ?  ?  ?Medications  ?dexamethasone (DECADRON) tablet 10 mg (has no administration in time range)  ?sodium chloride 0.9 % bolus 1,000 mL (1,000 mLs Intravenous New Bag/Given 04/04/21 0513)  ?ketorolac (TORADOL) 15 MG/ML injection 15 mg (15 mg Intravenous Given 04/04/21 0516)  ?ondansetron [facility]) injection 4 mg (4 mg Intravenous Given 04/04/21 0515)  ?  ? ?Procedures  /  Critical Care ?Procedures ? ?ED Course and Medical Decision Making  ?Initial Impression and Ddx ?Symptoms consistent with viral illness and patient is already tested positive for COVID-19.  Complaining of poor p.o. intake, possible dehydration.  Will obtain screening labs, provide fluids and reassess. ? ?Past medical/surgical history that increases complexity of ED encounter: Hypertension ? ?Interpretation of Diagnostics ?I personally reviewed the Chest Xray and my interpretation is as follows: No obvious pneumothorax or opacities ?   ?Labs are reassuring with no significant blood count or electrolyte disturbance ? ?Patient Reassessment and Ultimate Disposition/Management ?On reassessment patient is sitting comfortably, oxygen saturation 96%, no increased work of breathing, vital signs all normal.  She is now on day 5 of symptoms, she is fully vaccinated, doubt any benefit of Paxlovid.  Appropriate for discharge with return precautions.  She is worried about her sore throat, which is particularly bothersome, worse with any swallowing.  Will provide single dose of Decadron. ? ?Patient management required discussion with the following services or consulting groups:  None ? ?Complexity of Problems Addressed ?Acute illness or injury  that poses threat of life of bodily function ? ?Additional Data Reviewed and Analyzed ?Further history obtained from: ?Further history from spouse/family member ? ?Additional Factors Impacting ED Encounter Risk ?Consideration of hospitalization ? ?Barth Kirks. Sedonia Small, MD ?[facility] ?[email or web address] ? ?Final Clinical  Impressions(s) / ED Diagnoses  ? ?  ICD-10-CM   ?1. COVID-19  U07.1   ?  ?  ?ED Discharge Orders   ? ?      Ordered  ?  ondansetron (ZOFRAN-ODT) 4 MG disintegrating tablet  Every 8 hours PRN       ? 04/04/21 0622  ? ?  ?  ? ?  ?  ? ?Discharge Instructions Discussed with and Provided to Patient:  ? ? ? ?Discharge Instructions   ? ?  ?You were evaluated in the Emergency Department and after careful evaluation, we did not find any emergent condition requiring admission or further testing in the hospital. ? ?Your exam/testing today is overall reassuring.  Symptoms seem well explained to COVID-19.  Recommend Tylenol and Motrin at home for discomfort.  Can use the Zofran medication as needed for nausea.  Drink plenty of fluids, get plenty of rest. ? ?Please return to the Emergency Department if you experience any worsening of your condition.   Thank you for allowing [place] to be a part of your care. ? ? ? ? ?  ?Maudie Flakes, MD ?04/04/21 [phone] ? ?

## 2021-04-04 NOTE — ED Triage Notes (Addendum)
Pt states she was diagnosed with covid yesterday. States she has been in [place] until Monday. C/o feeling sob, fever, sore throat. Decreased appetite,chills. Sats 91%. Pt vomiting in triage. Sob with exertion to triage.  ?

## 2021-04-05 NOTE — Telephone Encounter (Signed)
error 

## 2021-04-07 ENCOUNTER — Telehealth: Payer: Self-pay | Admitting: Internal Medicine

## 2021-04-07 NOTE — Telephone Encounter (Signed)
Spoke with pt who reports she currently has Covid.  Last night she awoke to a rapid HR of 160 and elevated BP of 150/95.  She denies additional symptoms of CP, SOB or dizziness with this episode but states she "just didn't feel well" ?She reports her HR and BP began to return to normal after about 10 minutes.  This morning her HR is in the 60's and BP is 130/85. ?Will forward to Dr Harrington Challenger for further review and recommendations.  Reviewed ED precautions with pt.  She verbalizes understanding and agrees with current plan.  ?

## 2021-04-07 NOTE — Telephone Encounter (Signed)
?  STAT if HR is under 50 or over 120 ?(normal HR is 60-100 beats per minute) ? ?What is your heart rate? 160  ? ?Do you have a log of your heart rate readings (document readings)?  ? ?Do you have any other symptoms? Pt said, she tested positive for covid and last night she felt her heart was beating fast and she check her HR it was 160 bpm and her BP was 150/95. She wanted to know if this is something she needs to be concern about. She said, she did have SOB the reason why she went to ED last 03/19, right now the only symptoms she has is cough  ?

## 2021-04-07 NOTE — Telephone Encounter (Signed)
Pt called to report hat she has COVID and last night her HR went up to 160 about 2am.. she had some palpitations but no dizziness, SOB... BP was 150/95... she feels better not BP 130/70 HR 78... she was seen in the 04/04/21 for SOB and she was given a one time dose of Toradol but no new OTC meds.  ? ?She says she had a fever 102 and it is finally improving but she is coughing a lot... I have advised he to talk with her PCP Dr. Regis Bill... she did not get the Paxlovid... she will continue tom monitor her HR and BP... I made her an appt for early 04/2021 with Dr. Harrington Challenger she is overdue for follow up.  ? ?I will forward to Dr. Harrington Challenger for review and recommendations.  ? ? ?

## 2021-04-08 ENCOUNTER — Telehealth (INDEPENDENT_AMBULATORY_CARE_PROVIDER_SITE_OTHER): Payer: Medicare Other | Admitting: Internal Medicine

## 2021-04-08 ENCOUNTER — Encounter: Payer: Self-pay | Admitting: Internal Medicine

## 2021-04-08 DIAGNOSIS — U071 COVID-19: Secondary | ICD-10-CM | POA: Diagnosis not present

## 2021-04-08 NOTE — Progress Notes (Signed)
?Virtual Visit via Video Note ? ?I connected with Alicia [name] on 04/08/21 at  8:15 AM EDT by a video enabled telemedicine application and verified that I am speaking with the correct person using two identifiers. ?Location patient: home ?Location provider:home office ?Persons participating in the virtual visit: patient, provider ? ?[place] national recommendations  regarding COVID 19 pandemic   video visit is advised over in office visit for this patient.  ?Patient aware  of the limitations of evaluation and management by telemedicine and  availability of in person appointments. and agreed to proceed. ? ? ?HPI: ?Alicia [name] presents for video visit dx covid 19 resp infection  seen in ed 3 19 and given decadron for st .  Lab and cxray nl of unrevealing  ?Felt stable enough to not benefit from paxlovid . Had temp of 102 and  had episode of tachycardia  lasting  about 10 minutes and hasnt recurred ( see note Dr Harrington Challenger)  (remote  hx of similar when younger but not lasting very long)  ?Fever gone after a few days  99 range now ?Resp  deep cough  dry  ( remote hs of pna)   no sig sob  pulse ox 94-96  ?Still tired  dec appetite but better today than yesterday .  ? ? ?Had travel to [place] and [place]   husband not sick  but has had covid x 2 infection.  ? ?ROS: See pertinent positives and negatives per HPI. ? ?Past Medical History:  ?Diagnosis Date  ? Allergy   ? Cataract   ? removed bilateral -2014  ? Chronic tension headaches   ? Hx of varicella   ? Hypertension   ? Kidney stones   ? Near syncope   ? Nephrolithiasis   ? history of  passed on own.   ? Palpitations   ? Thyroid disease   ? pt. denies 06/11/19  ? Tubular adenoma of colon 02/1997  ? w/ HGD  ? Varicose veins   ? ? ?Past Surgical History:  ?Procedure Laterality Date  ? ABDOMINAL HYSTERECTOMY    ? fibroids non cancer   ? CATARACT EXTRACTION    ? Bilateral  ? cataract surgery Right 03/02/2012  ? CHOLECYSTECTOMY    ? KNEE SURGERY  04/2020  ? KNEE  SURGERY    ? ROTATOR CUFF REPAIR Right   ? TONSILLECTOMY    ? ? ?Family History  ?Problem Relation Age of Onset  ? Cancer Mother   ?     CERVICAL uterin cancer   ? Liver disease Mother   ?     from chronic heaptitis   ? Diabetes Mother   ? Heart disease Father   ?     avdisease avr  ? Stroke Father   ? Prostate cancer Father   ? Cancer Sister   ?     Ovarian and endometrial  ? Cancer Maternal Aunt   ?     Colon  ? Heart disease Maternal Grandfather   ? Heart disease Paternal Grandmother   ? Heart disease Paternal Grandfather   ? Diabetes Sister   ? Diabetes Brother   ? Colon cancer Paternal Aunt   ? Esophageal cancer Paternal Uncle   ? Rectal cancer Neg Hx   ? Stomach cancer Neg Hx   ? ? ?Social History  ? ?Tobacco Use  ? Smoking status: Never  ? Smokeless tobacco: Never  ?Vaping Use  ? Vaping Use: Never used  ?Substance  Use Topics  ? Alcohol use: No  ? Drug use: No  ? ? ? ? ?Current Outpatient Medications:  ?  chlorthalidone (HYGROTON) 50 MG tablet, Take 1 tablet (50 mg total) by mouth daily., Disp: 90 tablet, Rfl: 3 ?  Multiple Vitamin (MULTIVITAMIN WITH MINERALS) TABS tablet, Take 1 tablet by mouth daily. Women's formula, Disp: , Rfl:  ?  ondansetron (ZOFRAN-ODT) 4 MG disintegrating tablet, Take 1 tablet (4 mg total) by mouth every 8 (eight) hours as needed for nausea or vomiting., Disp: 20 tablet, Rfl: 0 ? ?EXAM: ?BP Readings from Last 3 Encounters:  ?04/04/21 (!) 110/53  ?12/01/20 120/78  ?09/07/20 134/80  ? ? ?VITALS per patient if applicable: ? ?GENERAL: alert, oriented, appears well and in no acute distress ? ?HEENT: atraumatic, conjunttiva clear, no obvious abnormalities on inspection of external nose and ears ? ?NECK: normal movements of the head and neck ? ?LUNGS: on inspection no signs of respiratory distress, breathing rate appears normal, no obvious gross SOB, gasping or wheezing ? ?CV: no obvious cyanosis ? ?MS: moves all visible extremities without noticeable abnormality ? ?PSYCH/NEURO: pleasant and  cooperative, no obvious depression or anxiety, speech and thought processing grossly intact ?Lab Results  ?Component Value Date  ? WBC 6.3 04/04/2021  ? HGB 14.6 04/04/2021  ? HCT 43.4 04/04/2021  ? PLT 155 04/04/2021  ? GLUCOSE 121 (H) 04/04/2021  ? CHOL 201 (H) 12/01/2020  ? TRIG 128 12/01/2020  ? HDL 68 12/01/2020  ? LDLCALC 111 (H) 12/01/2020  ? ALT 32 04/04/2021  ? AST 38 04/04/2021  ? NA 137 04/04/2021  ? K 3.6 04/04/2021  ? CL 99 04/04/2021  ? CREATININE 0.74 04/04/2021  ? BUN 15 04/04/2021  ? CO2 27 04/04/2021  ? TSH 2.560 12/01/2020  ? INR 1.0 ratio 04/03/2009  ? HGBA1C 6.1 (H) 12/01/2020  ? ? ?ASSESSMENT AND PLAN: ? ?Discussed the following assessment and plan: ? ?  ICD-10-CM   ?1. COVID-19 virus infection  U07.1   ?  ? ?Currently uncomplicated but with residual cough fatigue.  Episode of tachycardia may have been SVT currently no problem will monitor and be seen earlier than her appointment with Dr. Harrington Challenger at the end of April if needed. ?No sx of cv compromise  at this time.  ?Reviewed supportive care symptom relief and monitoring vital signs.  Expectant management of cough may last a few weeks but follow-up if ongoing or other concerns. ?Stay hydrated relative rest outside time etc. ?Counseled.  ? Expectant management and discussion of plan and treatment with opportunity to ask questions and all were answered. The patient agreed with the plan and demonstrated an understanding of the instructions. ?  ?Advised to call back or seek an in-person evaluation if worsening  or having  further concerns  in interim. ?Return if symptoms worsen or fail to improve, for when planned. ? ? ? ?Shanon Ace, MD  ?

## 2021-04-11 NOTE — Telephone Encounter (Signed)
MyChart message sent to the pt.

## 2021-04-12 NOTE — Telephone Encounter (Signed)
Called patient   REviewed   She is feeling some better ?I have asked her to keep a log   Continue to follow   Call back in about 1 wk     ?BP and HR can be erratic before / after covid ?

## 2021-05-10 NOTE — Progress Notes (Signed)
? ?Cardiology Office Note ? ? ?Date:  05/11/2021  ? ?ID:  Alicia [name], DOB 01-03-1956, MRN [id number] ? ?PCP:  Burnis Medin, MD  ?Cardiologist:   Dorris Carnes, MD  ? ?Pt presents for f/u of CP and HTN ?  ?History of Present Illness: ?Alicia [name] is a 66 y.o. female with a history of CP(atypical), palpitation  Echo in 2018 showed LVEF normal  Mild diastolic dysfunction ?I last sae the pt in the Nov 2022   ? ? ?She continues to work out at [employer] well.  Does water activities   ?Got COVID  March 15   Since then BP has been up and down   110s to 150s/   70s to 90    ?Feels a little switmmy when BP high or low  No chest tightness   ?Breathing is OK   ? ?Current Outpatient Medications  ?Medication Sig Dispense Refill  ? chlorthalidone (HYGROTON) 50 MG tablet Take 1 tablet (50 mg total) by mouth daily. 90 tablet 3  ? Multiple Vitamin (MULTIVITAMIN WITH MINERALS) TABS tablet Take 1 tablet by mouth daily. Women's formula    ? ?No current facility-administered medications for this visit.  ? ? ?Allergies:   Ciprofloxacin hcl, Iodinated contrast media, Iohexol, Lidocaine hcl, Morphine, Sulfa antibiotics, Prednisone, Valsartan, Shellfish allergy, Sulfur, Ciprofloxacin, Penicillins, Sulfamethoxazole-trimethoprim, and Sulfonamide derivatives  ? ?Past Medical History:  ?Diagnosis Date  ? Allergy   ? Cataract   ? removed bilateral -2014  ? Chronic tension headaches   ? Hx of varicella   ? Hypertension   ? Kidney stones   ? Near syncope   ? Nephrolithiasis   ? history of  passed on own.   ? Palpitations   ? Thyroid disease   ? pt. denies 06/11/19  ? Tubular adenoma of colon 02/1997  ? w/ HGD  ? Varicose veins   ? ? ?Past Surgical History:  ?Procedure Laterality Date  ? ABDOMINAL HYSTERECTOMY    ? fibroids non cancer   ? CATARACT EXTRACTION    ? Bilateral  ? cataract surgery Right 03/02/2012  ? CHOLECYSTECTOMY    ? KNEE SURGERY  04/2020  ? KNEE SURGERY    ? ROTATOR CUFF REPAIR Right   ? TONSILLECTOMY    ? ? ? ?Social  History:  The patient  reports that she has never smoked. She has never used smokeless tobacco. She reports that she does not drink alcohol and does not use drugs.  ? ?Family History:  The patient's family history includes Cancer in her maternal aunt, mother, and sister; Colon cancer in her paternal aunt; Diabetes in her brother, mother, and sister; Esophageal cancer in her paternal uncle; Heart disease in her father, maternal grandfather, paternal grandfather, and paternal grandmother; Liver disease in her mother; Prostate cancer in her father; Stroke in her father.  ? ? ?ROS:  Please see the history of present illness. All other systems are reviewed and  Negative to the above problem except as noted.  ? ? ?PHYSICAL EXAM: ?VS:  BP (!) 140/94   Pulse (!) 59   Ht '5\' 6"'$  (1.676 m)   Wt 252 lb 12.8 oz (114.7 kg)   SpO2 96%   BMI 40.80 kg/m?   ?GEN:  Morbidly obese 66 yo  in no acute distress  ?HEENT: normal  ?Neck:  JVP is normal    No  carotid bruits, ?Cardiac: RRR; No signif murmur   No edema in legs  ?Respiratory:  clear  to auscultation bilaterally,  ?GI: soft, nontender, nondistended, + BS  No hepatomegaly  ?MS: no deformity Moving all extremities   ?Skin: warm and dry, no rash ? ?EKG:  EKG is ordered today.   SB 59 bpm    ? ?Lipid Panel ?   ?Component Value Date/Time  ? CHOL 201 (H) 12/01/2020 1159  ? TRIG 128 12/01/2020 1159  ? HDL 68 12/01/2020 1159  ? CHOLHDL 3.0 12/01/2020 1159  ? CHOLHDL 3 07/17/2015 1500  ? VLDL 27.8 07/17/2015 1500  ? LDLCALC 111 (H) 12/01/2020 1159  ? ?  ? ?Wt Readings from Last 3 Encounters:  ?05/11/21 252 lb 12.8 oz (114.7 kg)  ?04/04/21 240 lb (108.9 kg)  ?12/01/20 258 lb 9.6 oz (117.3 kg)  ?  ? ? ?ASSESSMENT AND PLAN: ? ? ?1 HTN  BP is up and down   Woul recomm cutting back on chlorthailidone to 25   Add losartan 25    She has been on this combo in the psat      ?Write in with mychart for response    May check electrolytes though with last K of 3.6 shold be OK ? ?2  Lipdis   LDL is  OK at 103  I have reviewed her CT scan in the past (abdomen)   No signif calcifications ? ?4   Hepatic steatosis  Pt is working of low carb diet   has lost some weight   ? ?Will check labs today    Tentative follow up in 1 year ?Stay active    ? ? ?Current medicines are reviewed at length with the patient today.  The patient does not have concerns regarding medicines. ? ?F/U with me as needed if symptoms change   ?Signed, ?Dorris Carnes, MD  ?05/11/2021 1:11 PM    ?[facility] ?[address], [place], [place]  [address] ?Phone: [phone]; Fax: [phone]  ? ? ? ?

## 2021-05-11 ENCOUNTER — Ambulatory Visit (INDEPENDENT_AMBULATORY_CARE_PROVIDER_SITE_OTHER): Payer: Medicare Other | Admitting: Internal Medicine

## 2021-05-11 ENCOUNTER — Encounter: Payer: Self-pay | Admitting: Internal Medicine

## 2021-05-11 VITALS — BP 140/94 | HR 59 | Ht 66.0 in | Wt 252.8 lb

## 2021-05-11 DIAGNOSIS — I872 Venous insufficiency (chronic) (peripheral): Secondary | ICD-10-CM

## 2021-05-11 DIAGNOSIS — Z79899 Other long term (current) drug therapy: Secondary | ICD-10-CM | POA: Diagnosis not present

## 2021-05-11 DIAGNOSIS — I10 Essential (primary) hypertension: Secondary | ICD-10-CM | POA: Diagnosis not present

## 2021-05-11 MED ORDER — LOSARTAN POTASSIUM 25 MG PO TABS: 25.0000 mg | ORAL_TABLET | Freq: Every day | ORAL | 3 refills | Status: DC

## 2021-05-11 MED ORDER — CHLORTHALIDONE 25 MG PO TABS: 25.0000 mg | ORAL_TABLET | Freq: Every day | ORAL | 3 refills | Status: DC

## 2021-05-11 NOTE — Patient Instructions (Signed)
Medication Instructions:  ?Decrease your Chlorthalidone to 25 mg daily  ?Start Losartan 25 mg daily  ?*If you need a refill on your cardiac medications before your next appointment, please call your pharmacy* ? ? ?Lab Work: ? ?If you have labs (blood work) drawn today and your tests are completely normal, you will receive your results only by: ?MyChart Message (if you have MyChart) OR ?A paper copy in the mail ?If you have any lab test that is abnormal or we need to change your treatment, we will call you to review the results. ? ? ?Testing/Procedures: ? ? ? ?Follow-Up: ?At [facility], you and your health needs are our priority.  As part of our continuing mission to provide you with exceptional heart care, we have created designated Provider Care Teams.  These Care Teams include your primary Cardiologist (physician) and Advanced Practice Providers (APPs -  Physician Assistants and Nurse Practitioners) who all work together to provide you with the care you need, when you need it. ? ?We recommend signing up for the patient portal called "MyChart".  Sign up information is provided on this After Visit Summary.  MyChart is used to connect with patients for Virtual Visits (Telemedicine).  Patients are able to view lab/test results, encounter notes, upcoming appointments, etc.  Non-urgent messages can be sent to your provider as well.   ?To learn more about what you can do with MyChart, go to [email or web address].   ? ?Your next appointment:   ?1 year(s) ? ?The format for your next appointment:   ?In Person ? ?Provider:   ?Dorris Carnes, MD   ? ? ?Other Instructions ? ? ? ?Important Information About Sugar ? ? ? ? ?  ?

## 2021-11-02 ENCOUNTER — Other Ambulatory Visit: Payer: Self-pay

## 2021-11-02 ENCOUNTER — Ambulatory Visit (HOSPITAL_BASED_OUTPATIENT_CLINIC_OR_DEPARTMENT_OTHER): Payer: Medicare Other | Attending: Rehabilitation | Admitting: Physical Therapy

## 2021-11-02 ENCOUNTER — Encounter (HOSPITAL_BASED_OUTPATIENT_CLINIC_OR_DEPARTMENT_OTHER): Payer: Self-pay | Admitting: Physical Therapy

## 2021-11-02 DIAGNOSIS — M25661 Stiffness of right knee, not elsewhere classified: Secondary | ICD-10-CM | POA: Diagnosis not present

## 2021-11-02 DIAGNOSIS — M25561 Pain in right knee: Secondary | ICD-10-CM

## 2021-11-02 DIAGNOSIS — M87051 Idiopathic aseptic necrosis of right femur: Secondary | ICD-10-CM | POA: Diagnosis present

## 2021-11-02 DIAGNOSIS — R2689 Other abnormalities of gait and mobility: Secondary | ICD-10-CM

## 2021-11-02 DIAGNOSIS — R600 Localized edema: Secondary | ICD-10-CM

## 2021-11-02 NOTE — Therapy (Signed)
OUTPATIENT PHYSICAL THERAPY LOWER EXTREMITY EVALUATION   Patient Name: Alicia [name] MRN: [id number] DOB:March 02, 1955, 66 y.o., female Today's Date: 11/03/2021   PT End of Session - 11/02/21 0952     Visit Number 1    Number of Visits 16    Date for PT Re-Evaluation 12/28/21    Authorization Type Medicare    PT Start Time 0936    PT Stop Time 6789    PT Time Calculation (min) 39 min    Activity Tolerance Patient tolerated treatment well    Behavior During Therapy [facility] for tasks assessed/performed             Past Medical History:  Diagnosis Date   Allergy    Cataract    removed bilateral -2014   Chronic tension headaches    Hx of varicella    Hypertension    Kidney stones    Near syncope    Nephrolithiasis    history of  passed on own.    Palpitations    Thyroid disease    pt. denies 06/11/19   Tubular adenoma of colon 02/1997   w/ HGD   Varicose veins    Past Surgical History:  Procedure Laterality Date   ABDOMINAL HYSTERECTOMY     fibroids non cancer    CATARACT EXTRACTION     Bilateral   cataract surgery Right 03/02/2012   CHOLECYSTECTOMY     KNEE SURGERY  04/2020   KNEE SURGERY     ROTATOR CUFF REPAIR Right    TONSILLECTOMY     Patient Active Problem List   Diagnosis Date Noted   Hypokalemia 01/25/2017   Thyroid disease    Palpitations    Nephrolithiasis    Near syncope    Kidney stones    Hypertension    Hx of varicella    Chronic tension headaches    Allergy    Nocturnal headaches 05/04/2016   Coccygodynia 01/29/2015   Atypical chest pain 09/09/2014   Essential hypertension 09/09/2014   Midline low back pain without sciatica 09/05/2013   Renal cyst 09/05/2013   Fatty infiltration of liver 38/10/1749   Umbilical hernia without obstruction and without gangrene 09/05/2013   Flank pain 07/29/2013   Encounter to establish care with new doctor 07/11/2013   Left leg pain 07/11/2013   Cough 01/22/2013   Swelling in head/neck 10/31/2012    Routine health maintenance 04/05/2012   Unspecified vitamin D deficiency 04/04/2012   Varicose veins of lower extremities with other complications 02/58/5277   Chronic venous insufficiency 09/02/2011   Allergic rhinitis, cause unspecified 06/08/2011   Paresthesia of left arm 06/08/2011   Essential hypertension, benign 03/26/2010   OBESITY, CLASS III 10/02/2009   Other and unspecified coagulation defects 04/03/2009   PERIPHERAL EDEMA 10/22/2008   HEMORRHOIDS, INTERNAL, WITH BLEEDING 05/07/2008   NEPHROLITHIASIS, HX OF 04/28/2008   SYNCOPE 04/19/2007   ANXIETY DEPRESSION 10/31/2006   HYSTERECTOMY, VAGINAL, HX OF 10/31/2006    PCP: Dr Shanon Ace   REFERRING PROVIDER: Laqueta Jean PA-C  REFERRING DIAG:  [phone] (ICD-10-CM) - Idiopathic aseptic necrosis of right femur  Right UKA   THERAPY DIAG:  Acute pain of right knee  Stiffness of right knee, not elsewhere classified  Localized edema  Other abnormalities of gait and mobility  Rationale for Evaluation and Treatment Rehabilitation  ONSET DATE: 10/26/2021  Days since surgery: 10/26/2021  SUBJECTIVE:   SUBJECTIVE STATEMENT: Patient had a unicompartmental knee replacement on 10/10. At this time she is limited by  swelling. She has difficulty getting in and out of the car.  She had necrosis in her knee prior to surgery. She used a cane 2nd to the instability. She is using a walker now.   PERTINENT HISTORY: Right RTC repair; Right meniscal repair;   PAIN:  Are you having pain? Yes: NPRS scale: 8/10 has reached a 10/10  Pain location: right lateral knee  Pain description: aching / swelling  Aggravating factors: Standing , walking, activity and bending  Relieving factors: oxycodone   PRECAUTIONS: None  WEIGHT BEARING RESTRICTIONS Yes WBAT   FALLS:  Has patient fallen in last 6 months? No  LIVING ENVIRONMENT: No steps into her house  OCCUPATION: retried   PLOF: Independent with household mobility with  device Was using a cane due to knee collapsing   PATIENT GOALS  To have less pain and to start being able to move again   OBJECTIVE:   DIAGNOSTIC FINDINGS:  Nothing post op   PATIENT SURVEYS:  FOTO    COGNITION:  Overall cognitive status: Within functional limits for tasks assessed     SENSATION: Patient reports she does not think the block has worn off yet   EDEMA:  Circumferential: 53.6 48.3   POSTURE: rounded shoulders  PALPATION: No unexpected tenderness to palpation  LOWER EXTREMITY ROM:  Passive ROM Right eval Left eval  Hip flexion    Hip extension    Hip abduction    Hip adduction    Hip internal rotation    Hip external rotation    Knee flexion 50 with pain and guarding   Knee extension -6   Ankle dorsiflexion    Ankle plantarflexion    Ankle inversion    Ankle eversion     (Blank rows = not tested)  LOWER EXTREMITY MMT:  MMT Right eval Left eval  Hip flexion 2/5   Hip extension    Hip abduction    Hip adduction    Hip internal rotation    Hip external rotation    Knee flexion 3/5     Knee extension 2/5   Ankle dorsiflexion    Ankle plantarflexion    Ankle inversion    Ankle eversion     (Blank rows = not tested)  FUNCTIONAL TESTS:  Needs hands to stand and gaurding  Needs assist to get right leg onto the table.   GAIT: Decreased single leg stance on the right. Increased lean on the walker and trunk flexion; increased lateral movement away form the hip.    TODAY'S TREATMENT: Access Code: [email or web address] URL: [email or web address] Date: 11/03/2021 Prepared by: Carolyne Littles  Exercises - Supine Quad Set  - 5 x daily - 7 x weekly - 3 sets - 10 reps - 4-5 sec hold  hold - Supine Heel Slide with Strap  - 1 x daily - 7 x weekly - 3 sets - 5 reps - 10 sec  hold - Seated Ankle Pumps  - 5 x daily - 7 x weekly - 3 sets - 10 reps   PATIENT EDUCATION:  Education details: reviewed HEP and symptom management  Person educated:  Patient Education method: Explanation, Demonstration, Tactile cues, Verbal cues, and Handouts Education comprehension: verbalized understanding, returned demonstration, verbal cues required, tactile cues required, and needs further education   HOME EXERCISE PROGRAM: Access Code: [email or web address] URL: [email or web address] Date: 11/03/2021 Prepared by: Carolyne Littles  Exercises - Supine Quad Set  - 5 x daily - 7 x weekly - 3 sets - 10 reps -  4-5 sec hold  hold - Supine Heel Slide with Strap  - 1 x daily - 7 x weekly - 3 sets - 5 reps - 10 sec  hold - Seated Ankle Pumps  - 5 x daily - 7 x weekly - 3 sets - 10 reps  ASSESSMENT:  CLINICAL IMPRESSION: Patient is a 66 y.o. female who was seen today for physical therapy evaluation and treatment for  a rightUKA on 10/10. She had significant difficulty getting in and out of the car today. She was advised to call her MD and see if she may be able to get a few weeks of home health. She also report incontinence since the epidural. She was strongly advised to contact the MD as soon as she left therapy today. She has limited flexion and pain with functional mobility. She has expected deficits in knee and hip strength. She is currently using a walker. She would benefit from skilled therapy to improve her ability to walk in the community and to perform ADL's and IADL's    OBJECTIVE IMPAIRMENTS Abnormal gait, decreased activity tolerance, decreased balance, decreased endurance, decreased mobility, difficulty walking, decreased ROM, decreased strength, impaired perceived functional ability, and pain.   ACTIVITY LIMITATIONS carrying, lifting, bending, sitting, standing, squatting, stairs, transfers, bed mobility, and locomotion level  PARTICIPATION LIMITATIONS: meal prep, cleaning, laundry, driving, shopping, community activity, and yard work  PERSONAL FACTORS 1-2 comorbidities: right RTC repair; prior right knee surgery   are also affecting patient's  functional outcome.   REHAB POTENTIAL: Good significant pain   CLINICAL DECISION MAKING: Evolving/moderate complexity pain and swelling limiting her ability to get in an out of the car and bed   EVALUATION COMPLEXITY: Moderate   GOALS: Goals reviewed with patient? Yes  SHORT TERM GOALS: Target date: 12/01/2021  Patient will increase passive flexion to 75 degrees  Baseline: Goal status: INITIAL  2. Patient will increase hip flexor and knee extensor strength to 3+/5  Baseline:  Goal status: INITIAL  3.  Patient will demonstrate a 2 cm decrease in mid patella edema on the right  Baseline:  Goal status: INITIAL   LONG TERM GOALS: Target date: 12/01/2021   Patient will got up/down 6 steps without pain  Baseline:  Goal status: INITIAL  2.  Patient will bend knee 115 degrees without pain in order to get in and out of the car comfortably  Baseline:  Goal status: INITIAL  3.  Patient will ambulate 2000' with self report of no increase in pain with LRAD Baseline:  Goal status: INITIAL   PLAN: PT FREQUENCY: 2x/week  PT DURATION: 6 weeks  PLANNED INTERVENTIONS: Therapeutic exercises, Therapeutic activity, Neuromuscular re-education, Balance training, Gait training, Patient/Family education, Self Care, Joint mobilization, DME instructions, Aquatic Therapy, Electrical stimulation, Cryotherapy, Moist heat, Taping, Ultrasound, and Manual therapy  PLAN FOR NEXT SESSION: Patient may look ito home health In the meantime she was strongly advised to continue with OP rehab as her knee range is limited and gaurded. Continue with mobilization of the knee and progress gait. Add in standing exercise as tolerated.    Carney Living, PT 11/03/2021, 11:09 AM

## 2021-11-03 ENCOUNTER — Encounter (HOSPITAL_BASED_OUTPATIENT_CLINIC_OR_DEPARTMENT_OTHER): Payer: Self-pay | Admitting: Physical Therapy

## 2021-11-04 ENCOUNTER — Ambulatory Visit (HOSPITAL_BASED_OUTPATIENT_CLINIC_OR_DEPARTMENT_OTHER): Payer: Medicare Other | Admitting: Physical Therapy

## 2021-11-04 ENCOUNTER — Encounter (HOSPITAL_BASED_OUTPATIENT_CLINIC_OR_DEPARTMENT_OTHER): Payer: Self-pay | Admitting: Physical Therapy

## 2021-11-04 DIAGNOSIS — M87051 Idiopathic aseptic necrosis of right femur: Secondary | ICD-10-CM | POA: Diagnosis not present

## 2021-11-04 DIAGNOSIS — R2689 Other abnormalities of gait and mobility: Secondary | ICD-10-CM

## 2021-11-04 DIAGNOSIS — M25661 Stiffness of right knee, not elsewhere classified: Secondary | ICD-10-CM

## 2021-11-04 DIAGNOSIS — R600 Localized edema: Secondary | ICD-10-CM

## 2021-11-04 DIAGNOSIS — M25561 Pain in right knee: Secondary | ICD-10-CM

## 2021-11-04 NOTE — Therapy (Signed)
OUTPATIENT PHYSICAL THERAPY LOWER EXTREMITY Treatment    Patient Name: Alicia [name] MRN: [id number] DOB:1955-05-11, 66 y.o., female Today's Date: 11/04/2021   PT End of Session - 11/04/21 1410     Visit Number 2    Number of Visits 16    Date for PT Re-Evaluation 12/28/21    Authorization Type Medicare    PT Start Time 1345    PT Stop Time 1428    PT Time Calculation (min) 43 min    Activity Tolerance Patient tolerated treatment well    Behavior During Therapy WFL for tasks assessed/performed             Past Medical History:  Diagnosis Date   Allergy    Cataract    removed bilateral -2014   Chronic tension headaches    Hx of varicella    Hypertension    Kidney stones    Near syncope    Nephrolithiasis    history of  passed on own.    Palpitations    Thyroid disease    pt. denies 06/11/19   Tubular adenoma of colon 02/1997   w/ HGD   Varicose veins    Past Surgical History:  Procedure Laterality Date   ABDOMINAL HYSTERECTOMY     fibroids non cancer    CATARACT EXTRACTION     Bilateral   cataract surgery Right 03/02/2012   CHOLECYSTECTOMY     KNEE SURGERY  04/2020   KNEE SURGERY     ROTATOR CUFF REPAIR Right    TONSILLECTOMY     Patient Active Problem List   Diagnosis Date Noted   Hypokalemia 01/25/2017   Thyroid disease    Palpitations    Nephrolithiasis    Near syncope    Kidney stones    Hypertension    Hx of varicella    Chronic tension headaches    Allergy    Nocturnal headaches 05/04/2016   Coccygodynia 01/29/2015   Atypical chest pain 09/09/2014   Essential hypertension 09/09/2014   Midline low back pain without sciatica 09/05/2013   Renal cyst 09/05/2013   Fatty infiltration of liver 95/18/8416   Umbilical hernia without obstruction and without gangrene 09/05/2013   Flank pain 07/29/2013   Encounter to establish care with new doctor 07/11/2013   Left leg pain 07/11/2013   Cough 01/22/2013   Swelling in head/neck 10/31/2012    Routine health maintenance 04/05/2012   Unspecified vitamin D deficiency 04/04/2012   Varicose veins of lower extremities with other complications 60/63/0160   Chronic venous insufficiency 09/02/2011   Allergic rhinitis, cause unspecified 06/08/2011   Paresthesia of left arm 06/08/2011   Essential hypertension, benign 03/26/2010   OBESITY, CLASS III 10/02/2009   Other and unspecified coagulation defects 04/03/2009   PERIPHERAL EDEMA 10/22/2008   HEMORRHOIDS, INTERNAL, WITH BLEEDING 05/07/2008   NEPHROLITHIASIS, HX OF 04/28/2008   SYNCOPE 04/19/2007   ANXIETY DEPRESSION 10/31/2006   HYSTERECTOMY, VAGINAL, HX OF 10/31/2006    PCP: Dr Shanon Ace   REFERRING PROVIDER: Laqueta Jean PA-C  REFERRING DIAG:  [phone] (ICD-10-CM) - Idiopathic aseptic necrosis of right femur  Right UKA   THERAPY DIAG:  Acute pain of right knee  Stiffness of right knee, not elsewhere classified  Localized edema  Other abnormalities of gait and mobility  Rationale for Evaluation and Treatment Rehabilitation  ONSET DATE: 10/26/2021  Days since surgery: 10/26/2021  SUBJECTIVE:   SUBJECTIVE STATEMENT: The patient had an easier time getting in and out of the car. She  will stick with outpatient rehab. She reports her pain and swelling have improved somewhat. She also reports her incontinence has improved. It is unclear if she has called her MD about it.     Eval  Patient had a unicompartmental knee replacement on 10/10. At this time she is limited by swelling. She has difficulty getting in and out of the car.  She had necrosis in her knee prior to surgery. She used a cane 2nd to the instability. She is using a walker now.   PERTINENT HISTORY: Right RTC repair; Right meniscal repair;   PAIN:  Are you having pain? Yes: NPRS scale: 5/10 10/120 Pain location: right lateral knee  Pain description: aching / swelling  Aggravating factors: Standing , walking, activity and bending  Relieving  factors: oxycodone   PRECAUTIONS: None  WEIGHT BEARING RESTRICTIONS Yes WBAT   FALLS:  Has patient fallen in last 6 months? No  LIVING ENVIRONMENT: No steps into her house  OCCUPATION: retried   PLOF: Independent with household mobility with device Was using a cane due to knee collapsing   PATIENT GOALS  To have less pain and to start being able to move again   OBJECTIVE:   DIAGNOSTIC FINDINGS:  Nothing post op   PATIENT SURVEYS:  FOTO    COGNITION:  Overall cognitive status: Within functional limits for tasks assessed     SENSATION: Patient reports she does not think the block has worn off yet   EDEMA:  Circumferential: 53.6 48.3   POSTURE: rounded shoulders  PALPATION: No unexpected tenderness to palpation  LOWER EXTREMITY ROM:  Passive ROM Right eval Left eval 10/20 Right   Hip flexion     Hip extension     Hip abduction     Hip adduction     Hip internal rotation     Hip external rotation     Knee flexion 50 with pain and guarding  65  Knee extension -6  -4  Ankle dorsiflexion     Ankle plantarflexion     Ankle inversion     Ankle eversion      (Blank rows = not tested)  LOWER EXTREMITY MMT:  MMT Right eval Left eval  Hip flexion 2/5   Hip extension    Hip abduction    Hip adduction    Hip internal rotation    Hip external rotation    Knee flexion 3/5     Knee extension 2/5   Ankle dorsiflexion    Ankle plantarflexion    Ankle inversion    Ankle eversion     (Blank rows = not tested)  FUNCTIONAL TESTS:  Needs hands to stand and gaurding  Needs assist to get right leg onto the table.   GAIT: Decreased single leg stance on the right. Increased lean on the walker and trunk flexion; increased lateral movement away form the hip.    TODAY'S TREATMENT: 10/19 Manual: PROM into flexion and extension; trigger point release and STM to posterior knee and IT band   Quad set 3x10  SLR 3x8 min a at first but better with practice    Standing weight shift x20  Standing low march 2x10   5 min on nu-step with cuing for ranging L2     Eval: Access Code: [email or web address] URL: [email or web address] Date: 11/03/2021 Prepared by: Carolyne Littles  Exercises - Supine Quad Set  - 5 x daily - 7 x weekly - 3 sets - 10 reps - 4-5 sec hold  hold -  Supine Heel Slide with Strap  - 1 x daily - 7 x weekly - 3 sets - 5 reps - 10 sec  hold - Seated Ankle Pumps  - 5 x daily - 7 x weekly - 3 sets - 10 reps   PATIENT EDUCATION:  Education details: reviewed HEP and symptom management  Person educated: Patient Education method: Explanation, Demonstration, Tactile cues, Verbal cues, and Handouts Education comprehension: verbalized understanding, returned demonstration, verbal cues required, tactile cues required, and needs further education   HOME EXERCISE PROGRAM: Access Code: [email or web address] URL: [email or web address] Date: 11/03/2021 Prepared by: Carolyne Littles  Exercises - Supine Quad Set  - 5 x daily - 7 x weekly - 3 sets - 10 reps - 4-5 sec hold  hold - Supine Heel Slide with Strap  - 1 x daily - 7 x weekly - 3 sets - 5 reps - 10 sec  hold - Seated Ankle Pumps  - 5 x daily - 7 x weekly - 3 sets - 10 reps  ASSESSMENT:  CLINICAL IMPRESSION: The patient tolerated treatment well today. She is walking better and transferring better. Her range is still very limited but improved 15 degrees from the initial eval. She was encouraged to continue to aggressively mobilize at home but listen to her pain. She was given standing light weight bearing activity for home. We will progress as tolerated.    OBJECTIVE IMPAIRMENTS Abnormal gait, decreased activity tolerance, decreased balance, decreased endurance, decreased mobility, difficulty walking, decreased ROM, decreased strength, impaired perceived functional ability, and pain.   ACTIVITY LIMITATIONS carrying, lifting, bending, sitting, standing, squatting, stairs,  transfers, bed mobility, and locomotion level  PARTICIPATION LIMITATIONS: meal prep, cleaning, laundry, driving, shopping, community activity, and yard work  PERSONAL FACTORS 1-2 comorbidities: right RTC repair; prior right knee surgery   are also affecting patient's functional outcome.   REHAB POTENTIAL: Good significant pain   CLINICAL DECISION MAKING: Evolving/moderate complexity pain and swelling limiting her ability to get in an out of the car and bed   EVALUATION COMPLEXITY: Moderate   GOALS: Goals reviewed with patient? Yes  SHORT TERM GOALS: Target date: 12/02/2021  Patient will increase passive flexion to 75 degrees  Baseline: Goal status: INITIAL  2. Patient will increase hip flexor and knee extensor strength to 3+/5  Baseline:  Goal status: INITIAL  3.  Patient will demonstrate a 2 cm decrease in mid patella edema on the right  Baseline:  Goal status: INITIAL   LONG TERM GOALS: Target date: 12/02/2021   Patient will got up/down 6 steps without pain  Baseline:  Goal status: INITIAL  2.  Patient will bend knee 115 degrees without pain in order to get in and out of the car comfortably  Baseline:  Goal status: INITIAL  3.  Patient will ambulate 2000' with self report of no increase in pain with LRAD Baseline:  Goal status: INITIAL   PLAN: PT FREQUENCY: 2x/week  PT DURATION: 6 weeks  PLANNED INTERVENTIONS: Therapeutic exercises, Therapeutic activity, Neuromuscular re-education, Balance training, Gait training, Patient/Family education, Self Care, Joint mobilization, DME instructions, Aquatic Therapy, Electrical stimulation, Cryotherapy, Moist heat, Taping, Ultrasound, and Manual therapy  PLAN FOR NEXT SESSION: Patient may look ito home health In the meantime she was strongly advised to continue with OP rehab as her knee range is limited and gaurded. Continue with mobilization of the knee and progress gait. Add in standing exercise as tolerated.    Carney Living, PT 11/04/2021, 2:20 PM

## 2021-11-05 ENCOUNTER — Encounter (HOSPITAL_BASED_OUTPATIENT_CLINIC_OR_DEPARTMENT_OTHER): Payer: Self-pay | Admitting: Physical Therapy

## 2021-11-09 ENCOUNTER — Ambulatory Visit (HOSPITAL_BASED_OUTPATIENT_CLINIC_OR_DEPARTMENT_OTHER): Payer: Medicare Other | Admitting: Physical Therapy

## 2021-11-09 ENCOUNTER — Encounter (HOSPITAL_BASED_OUTPATIENT_CLINIC_OR_DEPARTMENT_OTHER): Payer: Self-pay | Admitting: Physical Therapy

## 2021-11-09 DIAGNOSIS — M25661 Stiffness of right knee, not elsewhere classified: Secondary | ICD-10-CM

## 2021-11-09 DIAGNOSIS — M87051 Idiopathic aseptic necrosis of right femur: Secondary | ICD-10-CM | POA: Diagnosis not present

## 2021-11-09 DIAGNOSIS — M25561 Pain in right knee: Secondary | ICD-10-CM

## 2021-11-09 DIAGNOSIS — R600 Localized edema: Secondary | ICD-10-CM

## 2021-11-09 DIAGNOSIS — R2689 Other abnormalities of gait and mobility: Secondary | ICD-10-CM

## 2021-11-09 NOTE — Therapy (Signed)
OUTPATIENT PHYSICAL THERAPY LOWER EXTREMITY Treatment    Patient Name: Alicia [name] MRN: [id number] DOB:Jun 01, 1955, 66 y.o., female Today's Date: 11/09/2021   PT End of Session - 11/09/21 1352     Visit Number 3    Number of Visits 16    Date for PT Re-Evaluation 12/28/21    Authorization Type Medicare    PT Start Time 1345    PT Stop Time 1428    PT Time Calculation (min) 43 min    Activity Tolerance Patient tolerated treatment well    Behavior During Therapy WFL for tasks assessed/performed             Past Medical History:  Diagnosis Date   Allergy    Cataract    removed bilateral -2014   Chronic tension headaches    Hx of varicella    Hypertension    Kidney stones    Near syncope    Nephrolithiasis    history of  passed on own.    Palpitations    Thyroid disease    pt. denies 06/11/19   Tubular adenoma of colon 02/1997   w/ HGD   Varicose veins    Past Surgical History:  Procedure Laterality Date   ABDOMINAL HYSTERECTOMY     fibroids non cancer    CATARACT EXTRACTION     Bilateral   cataract surgery Right 03/02/2012   CHOLECYSTECTOMY     KNEE SURGERY  04/2020   KNEE SURGERY     ROTATOR CUFF REPAIR Right    TONSILLECTOMY     Patient Active Problem List   Diagnosis Date Noted   Hypokalemia 01/25/2017   Thyroid disease    Palpitations    Nephrolithiasis    Near syncope    Kidney stones    Hypertension    Hx of varicella    Chronic tension headaches    Allergy    Nocturnal headaches 05/04/2016   Coccygodynia 01/29/2015   Atypical chest pain 09/09/2014   Essential hypertension 09/09/2014   Midline low back pain without sciatica 09/05/2013   Renal cyst 09/05/2013   Fatty infiltration of liver 40/98/1191   Umbilical hernia without obstruction and without gangrene 09/05/2013   Flank pain 07/29/2013   Encounter to establish care with new doctor 07/11/2013   Left leg pain 07/11/2013   Cough 01/22/2013   Swelling in head/neck 10/31/2012    Routine health maintenance 04/05/2012   Unspecified vitamin D deficiency 04/04/2012   Varicose veins of lower extremities with other complications 47/82/9562   Chronic venous insufficiency 09/02/2011   Allergic rhinitis, cause unspecified 06/08/2011   Paresthesia of left arm 06/08/2011   Essential hypertension, benign 03/26/2010   OBESITY, CLASS III 10/02/2009   Other and unspecified coagulation defects 04/03/2009   PERIPHERAL EDEMA 10/22/2008   HEMORRHOIDS, INTERNAL, WITH BLEEDING 05/07/2008   NEPHROLITHIASIS, HX OF 04/28/2008   SYNCOPE 04/19/2007   ANXIETY DEPRESSION 10/31/2006   HYSTERECTOMY, VAGINAL, HX OF 10/31/2006    PCP: Dr Shanon Ace   REFERRING PROVIDER: Laqueta Jean PA-C  REFERRING DIAG:  [phone] (ICD-10-CM) - Idiopathic aseptic necrosis of right femur  Right UKA   THERAPY DIAG:  Acute pain of right knee  Stiffness of right knee, not elsewhere classified  Localized edema  Other abnormalities of gait and mobility  Rationale for Evaluation and Treatment Rehabilitation  ONSET DATE: 10/26/2021  Days since surgery: 10/26/2021  SUBJECTIVE:   SUBJECTIVE STATEMENT: The patient wet to the MD. She reports the ride made it stiff. The  MD wants her to be aggressive with her motion.    Eval  Patient had a unicompartmental knee replacement on 10/10. At this time she is limited by swelling. She has difficulty getting in and out of the car.  She had necrosis in her knee prior to surgery. She used a cane 2nd to the instability. She is using a walker now.   PERTINENT HISTORY: Right RTC repair; Right meniscal repair;   PAIN:  Are you having pain? Yes: NPRS scale: 5/10 10/24 Pain location: right lateral knee  Pain description: stiffness   Aggravating factors: Standing , walking, activity and bending  Relieving factors: oxycodone   PRECAUTIONS: None  WEIGHT BEARING RESTRICTIONS Yes WBAT   FALLS:  Has patient fallen in last 6 months? No  LIVING  ENVIRONMENT: No steps into her house  OCCUPATION: retried   PLOF: Independent with household mobility with device Was using a cane due to knee collapsing   PATIENT GOALS  To have less pain and to start being able to move again   OBJECTIVE:   DIAGNOSTIC FINDINGS:  Nothing post op   PATIENT SURVEYS:  FOTO    COGNITION:  Overall cognitive status: Within functional limits for tasks assessed     SENSATION: Patient reports she does not think the block has worn off yet   EDEMA:  Circumferential: 53.6 48.3   POSTURE: rounded shoulders  PALPATION: No unexpected tenderness to palpation  LOWER EXTREMITY ROM:  Passive ROM Right eval Left eval 10/20 Right    Hip flexion      Hip extension      Hip abduction      Hip adduction      Hip internal rotation      Hip external rotation      Knee flexion 50 with pain and guarding  65 68  Knee extension -6  -4 -4  Ankle dorsiflexion      Ankle plantarflexion      Ankle inversion      Ankle eversion       (Blank rows = not tested)  LOWER EXTREMITY MMT:  MMT Right eval Left eval  Hip flexion 2/5   Hip extension    Hip abduction    Hip adduction    Hip internal rotation    Hip external rotation    Knee flexion 3/5     Knee extension 2/5   Ankle dorsiflexion    Ankle plantarflexion    Ankle inversion    Ankle eversion     (Blank rows = not tested)  FUNCTIONAL TESTS:  Needs hands to stand and gaurding  Needs assist to get right leg onto the table.   GAIT: Decreased single leg stance on the right. Increased lean on the walker and trunk flexion; increased lateral movement away form the hip.    TODAY'S TREATMENT: 10/24 Manual: PROM into flexion and extension; trigger point release and STM to posterior knee and IT band; mulligan flexion mobilization with strap Quad set 3x10  SLR 3x8 min a at first but better with practice  SAQ 3x10     5 min on nu-step with cuing for ranging L2   Vaso: 32 degrees low 5 min    Standing weight shift 2x10   Reviewed self patella mobilization for home  Standing weight shift x20     10/19 Manual: PROM into flexion and extension; trigger point release and STM to posterior knee and IT band   Quad set 3x10  SLR 3x8 min  a at first but better with practice   Standing weight shift x20  Standing low march 2x10   5 min on nu-step with cuing for ranging L2     Eval: Access Code: [email or web address] URL: [email or web address] Date: 11/03/2021 Prepared by: Carolyne Littles  Exercises - Supine Quad Set  - 5 x daily - 7 x weekly - 3 sets - 10 reps - 4-5 sec hold  hold - Supine Heel Slide with Strap  - 1 x daily - 7 x weekly - 3 sets - 5 reps - 10 sec  hold - Seated Ankle Pumps  - 5 x daily - 7 x weekly - 3 sets - 10 reps   PATIENT EDUCATION:  Education details: reviewed HEP and symptom management  Person educated: Patient Education method: Explanation, Demonstration, Tactile cues, Verbal cues, and Handouts Education comprehension: verbalized understanding, returned demonstration, verbal cues required, tactile cues required, and needs further education   HOME EXERCISE PROGRAM: Access Code: [email or web address] URL: [email or web address] Date: 11/03/2021 Prepared by: Carolyne Littles  Exercises - Supine Quad Set  - 5 x daily - 7 x weekly - 3 sets - 10 reps - 4-5 sec hold  hold - Supine Heel Slide with Strap  - 1 x daily - 7 x weekly - 3 sets - 5 reps - 10 sec  hold - Seated Ankle Pumps  - 5 x daily - 7 x weekly - 3 sets - 10 reps  ASSESSMENT:  CLINICAL IMPRESSION: The patient's strength is doing well. She tolerated there-ex well. Her range progressed but it is still limited. We worked on a Geologist, engineering for her knee. We also worked on patella mobilization today. Therapy will continue to be aggressive with her range. We used vaso to reduce swelling today. She reported the vaso felt good. We will progress to steps as able.    OBJECTIVE  IMPAIRMENTS Abnormal gait, decreased activity tolerance, decreased balance, decreased endurance, decreased mobility, difficulty walking, decreased ROM, decreased strength, impaired perceived functional ability, and pain.   ACTIVITY LIMITATIONS carrying, lifting, bending, sitting, standing, squatting, stairs, transfers, bed mobility, and locomotion level  PARTICIPATION LIMITATIONS: meal prep, cleaning, laundry, driving, shopping, community activity, and yard work  PERSONAL FACTORS 1-2 comorbidities: right RTC repair; prior right knee surgery   are also affecting patient's functional outcome.   REHAB POTENTIAL: Good significant pain   CLINICAL DECISION MAKING: Evolving/moderate complexity pain and swelling limiting her ability to get in an out of the car and bed   EVALUATION COMPLEXITY: Moderate   GOALS: Goals reviewed with patient? Yes  SHORT TERM GOALS: Target date: 12/02/2021  Patient will increase passive flexion to 75 degrees  Baseline: Goal status: INITIAL  2. Patient will increase hip flexor and knee extensor strength to 3+/5  Baseline:  Goal status: INITIAL  3.  Patient will demonstrate a 2 cm decrease in mid patella edema on the right  Baseline:  Goal status: INITIAL   LONG TERM GOALS: Target date: 12/02/2021   Patient will got up/down 6 steps without pain  Baseline:  Goal status: INITIAL  2.  Patient will bend knee 115 degrees without pain in order to get in and out of the car comfortably  Baseline:  Goal status: INITIAL  3.  Patient will ambulate 2000' with self report of no increase in pain with LRAD Baseline:  Goal status: INITIAL   PLAN: PT FREQUENCY: 2x/week  PT DURATION: 6 weeks  PLANNED INTERVENTIONS: Therapeutic exercises, Therapeutic activity, Neuromuscular re-education, Balance training, Gait training, Patient/Family  education, Self Care, Joint mobilization, DME instructions, Aquatic Therapy, Electrical stimulation, Cryotherapy, Moist heat,  Taping, Ultrasound, and Manual therapy  PLAN FOR NEXT SESSION: Patient may look ito home health In the meantime she was strongly advised to continue with OP rehab as her knee range is limited and gaurded. Continue with mobilization of the knee and progress gait. Add in standing exercise as tolerated.    Carney Living, PT 11/09/2021, 2:08 PM

## 2021-11-11 ENCOUNTER — Encounter (HOSPITAL_BASED_OUTPATIENT_CLINIC_OR_DEPARTMENT_OTHER): Payer: Self-pay | Admitting: Physical Therapy

## 2021-11-11 ENCOUNTER — Ambulatory Visit (HOSPITAL_BASED_OUTPATIENT_CLINIC_OR_DEPARTMENT_OTHER): Payer: Medicare Other | Admitting: Physical Therapy

## 2021-11-11 DIAGNOSIS — M25661 Stiffness of right knee, not elsewhere classified: Secondary | ICD-10-CM

## 2021-11-11 DIAGNOSIS — R2689 Other abnormalities of gait and mobility: Secondary | ICD-10-CM

## 2021-11-11 DIAGNOSIS — M87051 Idiopathic aseptic necrosis of right femur: Secondary | ICD-10-CM | POA: Diagnosis not present

## 2021-11-11 DIAGNOSIS — M25561 Pain in right knee: Secondary | ICD-10-CM

## 2021-11-11 DIAGNOSIS — R600 Localized edema: Secondary | ICD-10-CM

## 2021-11-11 NOTE — Therapy (Signed)
OUTPATIENT PHYSICAL THERAPY LOWER EXTREMITY Treatment    Patient Name: Alicia [name] MRN: [id number] DOB:Aug 13, 1955, 66 y.o., female Today's Date: 11/11/2021   PT End of Session - 11/11/21 0802     Visit Number 4    Number of Visits 16    Date for PT Re-Evaluation 12/28/21    PT Start Time 0800    PT Stop Time 0843    PT Time Calculation (min) 43 min    Activity Tolerance Patient tolerated treatment well    Behavior During Therapy [facility] for tasks assessed/performed             Past Medical History:  Diagnosis Date   Allergy    Cataract    removed bilateral -2014   Chronic tension headaches    Hx of varicella    Hypertension    Kidney stones    Near syncope    Nephrolithiasis    history of  passed on own.    Palpitations    Thyroid disease    pt. denies 06/11/19   Tubular adenoma of colon 02/1997   w/ HGD   Varicose veins    Past Surgical History:  Procedure Laterality Date   ABDOMINAL HYSTERECTOMY     fibroids non cancer    CATARACT EXTRACTION     Bilateral   cataract surgery Right 03/02/2012   CHOLECYSTECTOMY     KNEE SURGERY  04/2020   KNEE SURGERY     ROTATOR CUFF REPAIR Right    TONSILLECTOMY     Patient Active Problem List   Diagnosis Date Noted   Hypokalemia 01/25/2017   Thyroid disease    Palpitations    Nephrolithiasis    Near syncope    Kidney stones    Hypertension    Hx of varicella    Chronic tension headaches    Allergy    Nocturnal headaches 05/04/2016   Coccygodynia 01/29/2015   Atypical chest pain 09/09/2014   Essential hypertension 09/09/2014   Midline low back pain without sciatica 09/05/2013   Renal cyst 09/05/2013   Fatty infiltration of liver 63/01/6008   Umbilical hernia without obstruction and without gangrene 09/05/2013   Flank pain 07/29/2013   Encounter to establish care with new doctor 07/11/2013   Left leg pain 07/11/2013   Cough 01/22/2013   Swelling in head/neck 10/31/2012   Routine health maintenance  04/05/2012   Unspecified vitamin D deficiency 04/04/2012   Varicose veins of lower extremities with other complications 93/23/5573   Chronic venous insufficiency 09/02/2011   Allergic rhinitis, cause unspecified 06/08/2011   Paresthesia of left arm 06/08/2011   Essential hypertension, benign 03/26/2010   OBESITY, CLASS III 10/02/2009   Other and unspecified coagulation defects 04/03/2009   PERIPHERAL EDEMA 10/22/2008   HEMORRHOIDS, INTERNAL, WITH BLEEDING 05/07/2008   NEPHROLITHIASIS, HX OF 04/28/2008   SYNCOPE 04/19/2007   ANXIETY DEPRESSION 10/31/2006   HYSTERECTOMY, VAGINAL, HX OF 10/31/2006    PCP: Dr Shanon Ace   REFERRING PROVIDER: Laqueta Jean PA-C  REFERRING DIAG:  [phone] (ICD-10-CM) - Idiopathic aseptic necrosis of right femur  Right UKA   THERAPY DIAG:  Acute pain of right knee  Stiffness of right knee, not elsewhere classified  Localized edema  Other abnormalities of gait and mobility  Rationale for Evaluation and Treatment Rehabilitation  ONSET DATE: 10/26/2021  Days since surgery: 10/26/2021  SUBJECTIVE:   SUBJECTIVE STATEMENT: The patient reports that yesterday evening she had significant pain in the knee. She had to take her medication.  Eval  Patient had a unicompartmental knee replacement on 10/10. At this time she is limited by swelling. She has difficulty getting in and out of the car.  She had necrosis in her knee prior to surgery. She used a cane 2nd to the instability. She is using a walker now.   PERTINENT HISTORY: Right RTC repair; Right meniscal repair;   PAIN:  Are you having pain? Yes: NPRS scale: 4/10 10/26 Pain location: right lateral knee and posterior knee  Pain description: stiffness   Aggravating factors: Standing , walking, activity and bending  Relieving factors: oxycodone   PRECAUTIONS: None  WEIGHT BEARING RESTRICTIONS Yes WBAT   FALLS:  Has patient fallen in last 6 months? No  LIVING ENVIRONMENT: No steps  into her house  OCCUPATION: retried   PLOF: Independent with household mobility with device Was using a cane due to knee collapsing   PATIENT GOALS  To have less pain and to start being able to move again   OBJECTIVE:   DIAGNOSTIC FINDINGS:  Nothing post op   PATIENT SURVEYS:  FOTO    COGNITION:  Overall cognitive status: Within functional limits for tasks assessed     SENSATION: Patient reports she does not think the block has worn off yet   EDEMA:  Circumferential: 53.6 48.3   POSTURE: rounded shoulders  PALPATION: No unexpected tenderness to palpation  LOWER EXTREMITY ROM:  Passive ROM Right eval Left eval 10/20 Right    Hip flexion      Hip extension      Hip abduction      Hip adduction      Hip internal rotation      Hip external rotation      Knee flexion 50 with pain and guarding  65 68  Knee extension -6  -4 -4  Ankle dorsiflexion      Ankle plantarflexion      Ankle inversion      Ankle eversion       (Blank rows = not tested)  LOWER EXTREMITY MMT:  MMT Right eval Left eval  Hip flexion 2/5   Hip extension    Hip abduction    Hip adduction    Hip internal rotation    Hip external rotation    Knee flexion 3/5     Knee extension 2/5   Ankle dorsiflexion    Ankle plantarflexion    Ankle inversion    Ankle eversion     (Blank rows = not tested)  FUNCTIONAL TESTS:  Needs hands to stand and gaurding  Needs assist to get right leg onto the table.   GAIT: Decreased single leg stance on the right. Increased lean on the walker and trunk flexion; increased lateral movement away form the hip.    TODAY'S TREATMENT: 10/26 Manual: PROM into flexion and extension; trigger point release and STM to posterior knee and IT band; mulligan flexion mobilization with strap; patella heat and creep mobilization   Quad set 3x10  SLR 3x8 min a at first but better with practice  SAQ 3x10   Heel raise to church pew x20   Gait: reviewed proper  fitting of the cane. She feels like her cane is too high at home. She had good technique with gait and good stability.   5 min on nu-step with cuing for ranging L2     Vaso: 32 degrees low 5 min   Standing weight shift 2x10   Reviewed self patella mobilization for home  PATIENT EDUCATION:  Education details: reviewed HEP and symptom management  Person educated: Patient Education method: Explanation, Demonstration, Tactile cues, Verbal cues, and Handouts Education comprehension: verbalized understanding, returned demonstration, verbal cues required, tactile cues required, and needs further education   HOME EXERCISE PROGRAM: Access Code: [email or web address] URL: [email or web address] Date: 11/03/2021 Prepared by: Carolyne Littles  Exercises - Supine Quad Set  - 5 x daily - 7 x weekly - 3 sets - 10 reps - 4-5 sec hold  hold - Supine Heel Slide with Strap  - 1 x daily - 7 x weekly - 3 sets - 5 reps - 10 sec  hold - Seated Ankle Pumps  - 5 x daily - 7 x weekly - 3 sets - 10 reps  ASSESSMENT:  CLINICAL IMPRESSION: The patient is making steady progress. Her total arc today was measured at 3-73. That is a 6 degree jump since the beginning of the week. She was encouraged to continue at home. We added church pews for involuntary fire of the quad. Therapy will continue to use Vso with the more aggressive mobilization of the knee.   OBJECTIVE IMPAIRMENTS Abnormal gait, decreased activity tolerance, decreased balance, decreased endurance, decreased mobility, difficulty walking, decreased ROM, decreased strength, impaired perceived functional ability, and pain.   ACTIVITY LIMITATIONS carrying, lifting, bending, sitting, standing, squatting, stairs, transfers, bed mobility, and locomotion level  PARTICIPATION LIMITATIONS: meal prep, cleaning, laundry, driving, shopping, community activity, and yard work  PERSONAL FACTORS 1-2 comorbidities: right RTC repair; prior right knee surgery    are also affecting patient's functional outcome.   REHAB POTENTIAL: Good significant pain   CLINICAL DECISION MAKING: Evolving/moderate complexity pain and swelling limiting her ability to get in an out of the car and bed   EVALUATION COMPLEXITY: Moderate   GOALS: Goals reviewed with patient? Yes  SHORT TERM GOALS: Target date: 12/02/2021  Patient will increase passive flexion to 75 degrees  Baseline: Goal status: INITIAL  2. Patient will increase hip flexor and knee extensor strength to 3+/5  Baseline:  Goal status: INITIAL  3.  Patient will demonstrate a 2 cm decrease in mid patella edema on the right  Baseline:  Goal status: INITIAL   LONG TERM GOALS: Target date: 12/02/2021   Patient will got up/down 6 steps without pain  Baseline:  Goal status: INITIAL  2.  Patient will bend knee 115 degrees without pain in order to get in and out of the car comfortably  Baseline:  Goal status: INITIAL  3.  Patient will ambulate 2000' with self report of no increase in pain with LRAD Baseline:  Goal status: INITIAL   PLAN: PT FREQUENCY: 2x/week  PT DURATION: 6 weeks  PLANNED INTERVENTIONS: Therapeutic exercises, Therapeutic activity, Neuromuscular re-education, Balance training, Gait training, Patient/Family education, Self Care, Joint mobilization, DME instructions, Aquatic Therapy, Electrical stimulation, Cryotherapy, Moist heat, Taping, Ultrasound, and Manual therapy  PLAN FOR NEXT SESSION: Patient may look ito home health In the meantime she was strongly advised to continue with OP rehab as her knee range is limited and gaurded. Continue with mobilization of the knee and progress gait. Add in standing exercise as tolerated.    Carney Living, PT 11/11/2021, 8:05 AM

## 2021-11-15 ENCOUNTER — Encounter (HOSPITAL_BASED_OUTPATIENT_CLINIC_OR_DEPARTMENT_OTHER): Payer: Self-pay | Admitting: Physical Therapy

## 2021-11-15 ENCOUNTER — Ambulatory Visit (HOSPITAL_BASED_OUTPATIENT_CLINIC_OR_DEPARTMENT_OTHER): Payer: Medicare Other | Admitting: Physical Therapy

## 2021-11-15 DIAGNOSIS — R600 Localized edema: Secondary | ICD-10-CM

## 2021-11-15 DIAGNOSIS — R2689 Other abnormalities of gait and mobility: Secondary | ICD-10-CM

## 2021-11-15 DIAGNOSIS — M25561 Pain in right knee: Secondary | ICD-10-CM

## 2021-11-15 DIAGNOSIS — M25661 Stiffness of right knee, not elsewhere classified: Secondary | ICD-10-CM

## 2021-11-15 DIAGNOSIS — M87051 Idiopathic aseptic necrosis of right femur: Secondary | ICD-10-CM | POA: Diagnosis not present

## 2021-11-15 NOTE — Therapy (Signed)
OUTPATIENT PHYSICAL THERAPY LOWER EXTREMITY Treatment    Patient Name: Alicia [name] MRN: [id number] DOB:Aug 22, 1955, 66 y.o., female Today's Date: 11/15/2021   PT End of Session - 11/15/21 0950     Visit Number 5    Number of Visits 16    Date for PT Re-Evaluation 12/28/21    Authorization Type Medicare    PT Start Time 0930    PT Stop Time 1012    PT Time Calculation (min) 42 min    Activity Tolerance Patient tolerated treatment well    Behavior During Therapy [facility] for tasks assessed/performed             Past Medical History:  Diagnosis Date   Allergy    Cataract    removed bilateral -2014   Chronic tension headaches    Hx of varicella    Hypertension    Kidney stones    Near syncope    Nephrolithiasis    history of  passed on own.    Palpitations    Thyroid disease    pt. denies 06/11/19   Tubular adenoma of colon 02/1997   w/ HGD   Varicose veins    Past Surgical History:  Procedure Laterality Date   ABDOMINAL HYSTERECTOMY     fibroids non cancer    CATARACT EXTRACTION     Bilateral   cataract surgery Right 03/02/2012   CHOLECYSTECTOMY     KNEE SURGERY  04/2020   KNEE SURGERY     ROTATOR CUFF REPAIR Right    TONSILLECTOMY     Patient Active Problem List   Diagnosis Date Noted   Hypokalemia 01/25/2017   Thyroid disease    Palpitations    Nephrolithiasis    Near syncope    Kidney stones    Hypertension    Hx of varicella    Chronic tension headaches    Allergy    Nocturnal headaches 05/04/2016   Coccygodynia 01/29/2015   Atypical chest pain 09/09/2014   Essential hypertension 09/09/2014   Midline low back pain without sciatica 09/05/2013   Renal cyst 09/05/2013   Fatty infiltration of liver 83/81/8403   Umbilical hernia without obstruction and without gangrene 09/05/2013   Flank pain 07/29/2013   Encounter to establish care with new doctor 07/11/2013   Left leg pain 07/11/2013   Cough 01/22/2013   Swelling in head/neck 10/31/2012    Routine health maintenance 04/05/2012   Unspecified vitamin D deficiency 04/04/2012   Varicose veins of lower extremities with other complications 75/43/6067   Chronic venous insufficiency 09/02/2011   Allergic rhinitis, cause unspecified 06/08/2011   Paresthesia of left arm 06/08/2011   Essential hypertension, benign 03/26/2010   OBESITY, CLASS III 10/02/2009   Other and unspecified coagulation defects 04/03/2009   PERIPHERAL EDEMA 10/22/2008   HEMORRHOIDS, INTERNAL, WITH BLEEDING 05/07/2008   NEPHROLITHIASIS, HX OF 04/28/2008   SYNCOPE 04/19/2007   ANXIETY DEPRESSION 10/31/2006   HYSTERECTOMY, VAGINAL, HX OF 10/31/2006    PCP: Dr Shanon Ace   REFERRING PROVIDER: Laqueta Jean PA-C  REFERRING DIAG:  [phone] (ICD-10-CM) - Idiopathic aseptic necrosis of right femur  Right UKA   THERAPY DIAG:  Acute pain of right knee  Stiffness of right knee, not elsewhere classified  Localized edema  Other abnormalities of gait and mobility  Rationale for Evaluation and Treatment Rehabilitation  ONSET DATE: 10/26/2021  Days since surgery: 10/26/2021  SUBJECTIVE:   SUBJECTIVE STATEMENT: The patient reports that yesterday evening she had significant pain in the knee. She  had to take her medication.    Eval  Patient had a unicompartmental knee replacement on 10/10. At this time she is limited by swelling. She has difficulty getting in and out of the car.  She had necrosis in her knee prior to surgery. She used a cane 2nd to the instability. She is using a walker now.   PERTINENT HISTORY: Right RTC repair; Right meniscal repair;   PAIN:  Are you having pain? Yes: NPRS scale: 4/10 10/26 Pain location: right lateral knee and posterior knee  Pain description: stiffness   Aggravating factors: Standing , walking, activity and bending  Relieving factors: oxycodone   PRECAUTIONS: None  WEIGHT BEARING RESTRICTIONS Yes WBAT   FALLS:  Has patient fallen in last 6 months?  No  LIVING ENVIRONMENT: No steps into her house  OCCUPATION: retried   PLOF: Independent with household mobility with device Was using a cane due to knee collapsing   PATIENT GOALS  To have less pain and to start being able to move again   OBJECTIVE:   DIAGNOSTIC FINDINGS:  Nothing post op   PATIENT SURVEYS:  FOTO    COGNITION:  Overall cognitive status: Within functional limits for tasks assessed     SENSATION: Patient reports she does not think the block has worn off yet   EDEMA:  Circumferential: 53.6 48.3   POSTURE: rounded shoulders  PALPATION: No unexpected tenderness to palpation  LOWER EXTREMITY ROM:  Passive ROM Right eval Left eval 10/20 Right    Hip flexion      Hip extension      Hip abduction      Hip adduction      Hip internal rotation      Hip external rotation      Knee flexion 50 with pain and guarding  65 68  Knee extension -6  -4 -4  Ankle dorsiflexion      Ankle plantarflexion      Ankle inversion      Ankle eversion       (Blank rows = not tested)  LOWER EXTREMITY MMT:  MMT Right eval Left eval  Hip flexion 2/5   Hip extension    Hip abduction    Hip adduction    Hip internal rotation    Hip external rotation    Knee flexion 3/5     Knee extension 2/5   Ankle dorsiflexion    Ankle plantarflexion    Ankle inversion    Ankle eversion     (Blank rows = not tested)  FUNCTIONAL TESTS:  Needs hands to stand and gaurding  Needs assist to get right leg onto the table.   GAIT: Decreased single leg stance on the right. Increased lean on the walker and trunk flexion; increased lateral movement away form the hip.    TODAY'S TREATMENT: 10/30 Manual: PROM into flexion and extension; trigger point release and STM to posterior knee and IT band;  ; patella heat and creep mobilization  PA and AP grade III and IV mobilization to the knee to improve flexion and extension    Vaso: 32 degrees low 5 min     10/26 Manual: PROM  into flexion and extension; trigger point release and STM to posterior knee and IT band; mulligan flexion mobilization with strap; patella heat and creep mobilization   Quad set 3x10  SLR 3x8 min a at first but better with practice  SAQ 3x10   Heel raise to [employer] pew x20  Gait: reviewed proper fitting of the cane. She feels like her cane is too high at home. She had good technique with gait and good stability.   5 min on nu-step with cuing for ranging L2     Vaso: 32 degrees low 5 min   Standing weight shift 2x10   Reviewed self patella mobilization for home     PATIENT EDUCATION:  Education details: reviewed HEP and symptom management  Person educated: Patient Education method: Explanation, Demonstration, Tactile cues, Verbal cues, and Handouts Education comprehension: verbalized understanding, returned demonstration, verbal cues required, tactile cues required, and needs further education   HOME EXERCISE PROGRAM: Access Code: [email or web address] URL: [email or web address] Date: 11/03/2021 Prepared by: Carolyne Littles  Exercises - Supine Quad Set  - 5 x daily - 7 x weekly - 3 sets - 10 reps - 4-5 sec hold  hold - Supine Heel Slide with Strap  - 1 x daily - 7 x weekly - 3 sets - 5 reps - 10 sec  hold - Seated Ankle Pumps  - 5 x daily - 7 x weekly - 3 sets - 10 reps  ASSESSMENT:  CLINICAL IMPRESSION: Therapy focused more on manual therapy today. We aggressively stretched into flexion. We also worked on patella mobility. She got to 76 degrees after stretching. She is progressing but it is slow. We will continue to progress as tolerated. She was advised to do her exercises at home. We will see her again on Wednesday.   OBJECTIVE IMPAIRMENTS Abnormal gait, decreased activity tolerance, decreased balance, decreased endurance, decreased mobility, difficulty walking, decreased ROM, decreased strength, impaired perceived functional ability, and pain.   ACTIVITY LIMITATIONS  carrying, lifting, bending, sitting, standing, squatting, stairs, transfers, bed mobility, and locomotion level  PARTICIPATION LIMITATIONS: meal prep, cleaning, laundry, driving, shopping, community activity, and yard work  PERSONAL FACTORS 1-2 comorbidities: right RTC repair; prior right knee surgery   are also affecting patient's functional outcome.   REHAB POTENTIAL: Good significant pain   CLINICAL DECISION MAKING: Evolving/moderate complexity pain and swelling limiting her ability to get in an out of the car and bed   EVALUATION COMPLEXITY: Moderate   GOALS: Goals reviewed with patient? Yes  SHORT TERM GOALS: Target date: 12/02/2021  Patient will increase passive flexion to 75 degrees  Baseline: Goal status: INITIAL  2. Patient will increase hip flexor and knee extensor strength to 3+/5  Baseline:  Goal status: INITIAL  3.  Patient will demonstrate a 2 cm decrease in mid patella edema on the right  Baseline:  Goal status: INITIAL   LONG TERM GOALS: Target date: 12/02/2021   Patient will got up/down 6 steps without pain  Baseline:  Goal status: INITIAL  2.  Patient will bend knee 115 degrees without pain in order to get in and out of the car comfortably  Baseline:  Goal status: INITIAL  3.  Patient will ambulate 2000' with self report of no increase in pain with LRAD Baseline:  Goal status: INITIAL   PLAN: PT FREQUENCY: 2x/week  PT DURATION: 6 weeks  PLANNED INTERVENTIONS: Therapeutic exercises, Therapeutic activity, Neuromuscular re-education, Balance training, Gait training, Patient/Family education, Self Care, Joint mobilization, DME instructions, Aquatic Therapy, Electrical stimulation, Cryotherapy, Moist heat, Taping, Ultrasound, and Manual therapy  PLAN FOR NEXT SESSION: Patient may look ito home health In the meantime she was strongly advised to continue with OP rehab as her knee range is limited and gaurded. Continue with mobilization of the knee and  progress gait. Add in standing  exercise as tolerated.    Carney Living, PT 11/15/2021, 10:09 AM

## 2021-11-17 ENCOUNTER — Ambulatory Visit (HOSPITAL_BASED_OUTPATIENT_CLINIC_OR_DEPARTMENT_OTHER): Payer: Medicare Other | Attending: Rehabilitation | Admitting: Physical Therapy

## 2021-11-17 ENCOUNTER — Encounter (HOSPITAL_BASED_OUTPATIENT_CLINIC_OR_DEPARTMENT_OTHER): Payer: Self-pay | Admitting: Physical Therapy

## 2021-11-17 DIAGNOSIS — M25661 Stiffness of right knee, not elsewhere classified: Secondary | ICD-10-CM | POA: Insufficient documentation

## 2021-11-17 DIAGNOSIS — R2689 Other abnormalities of gait and mobility: Secondary | ICD-10-CM | POA: Insufficient documentation

## 2021-11-17 DIAGNOSIS — M25561 Pain in right knee: Secondary | ICD-10-CM | POA: Diagnosis present

## 2021-11-17 DIAGNOSIS — R600 Localized edema: Secondary | ICD-10-CM | POA: Insufficient documentation

## 2021-11-17 NOTE — Therapy (Signed)
OUTPATIENT PHYSICAL THERAPY LOWER EXTREMITY Treatment    Patient Name: Alicia [name] MRN: [id number] DOB:04-23-1955, 66 y.o., female Today's Date: 11/17/2021   PT End of Session - 11/17/21 0856     Visit Number 6    Number of Visits 16    Date for PT Re-Evaluation 12/28/21    Authorization Type Medicare    PT Start Time 0800    PT Stop Time 0848    PT Time Calculation (min) 48 min    Activity Tolerance Patient tolerated treatment well    Behavior During Therapy [facility] for tasks assessed/performed             Past Medical History:  Diagnosis Date   Allergy    Cataract    removed bilateral -2014   Chronic tension headaches    Hx of varicella    Hypertension    Kidney stones    Near syncope    Nephrolithiasis    history of  passed on own.    Palpitations    Thyroid disease    pt. denies 06/11/19   Tubular adenoma of colon 02/1997   w/ HGD   Varicose veins    Past Surgical History:  Procedure Laterality Date   ABDOMINAL HYSTERECTOMY     fibroids non cancer    CATARACT EXTRACTION     Bilateral   cataract surgery Right 03/02/2012   CHOLECYSTECTOMY     KNEE SURGERY  04/2020   KNEE SURGERY     ROTATOR CUFF REPAIR Right    TONSILLECTOMY     Patient Active Problem List   Diagnosis Date Noted   Hypokalemia 01/25/2017   Thyroid disease    Palpitations    Nephrolithiasis    Near syncope    Kidney stones    Hypertension    Hx of varicella    Chronic tension headaches    Allergy    Nocturnal headaches 05/04/2016   Coccygodynia 01/29/2015   Atypical chest pain 09/09/2014   Essential hypertension 09/09/2014   Midline low back pain without sciatica 09/05/2013   Renal cyst 09/05/2013   Fatty infiltration of liver 24/58/0998   Umbilical hernia without obstruction and without gangrene 09/05/2013   Flank pain 07/29/2013   Encounter to establish care with new doctor 07/11/2013   Left leg pain 07/11/2013   Cough 01/22/2013   Swelling in head/neck 10/31/2012    Routine health maintenance 04/05/2012   Unspecified vitamin D deficiency 04/04/2012   Varicose veins of lower extremities with other complications 33/82/5053   Chronic venous insufficiency 09/02/2011   Allergic rhinitis, cause unspecified 06/08/2011   Paresthesia of left arm 06/08/2011   Essential hypertension, benign 03/26/2010   OBESITY, CLASS III 10/02/2009   Other and unspecified coagulation defects 04/03/2009   PERIPHERAL EDEMA 10/22/2008   HEMORRHOIDS, INTERNAL, WITH BLEEDING 05/07/2008   NEPHROLITHIASIS, HX OF 04/28/2008   SYNCOPE 04/19/2007   ANXIETY DEPRESSION 10/31/2006   HYSTERECTOMY, VAGINAL, HX OF 10/31/2006    PCP: Dr Shanon Ace   REFERRING PROVIDER: Laqueta Jean PA-C  REFERRING DIAG:  [phone] (ICD-10-CM) - Idiopathic aseptic necrosis of right femur  Right UKA   THERAPY DIAG:  Acute pain of right knee  Stiffness of right knee, not elsewhere classified  Localized edema  Other abnormalities of gait and mobility  Rationale for Evaluation and Treatment Rehabilitation  ONSET DATE: 10/26/2021  Days since surgery: 10/26/2021  SUBJECTIVE:   SUBJECTIVE STATEMENT: The patient reports that yesterday evening she had significant pain in the knee. She  had to take her medication.    Eval  Patient had a unicompartmental knee replacement on 10/10. At this time she is limited by swelling. She has difficulty getting in and out of the car.  She had necrosis in her knee prior to surgery. She used a cane 2nd to the instability. She is using a walker now.   PERTINENT HISTORY: Right RTC repair; Right meniscal repair;   PAIN:  Are you having pain? Yes: NPRS scale: 4/10 10/26 Pain location: right lateral knee and posterior knee  Pain description: stiffness   Aggravating factors: Standing , walking, activity and bending  Relieving factors: oxycodone   PRECAUTIONS: None  WEIGHT BEARING RESTRICTIONS Yes WBAT   FALLS:  Has patient fallen in last 6 months?  No  LIVING ENVIRONMENT: No steps into her house  OCCUPATION: retried   PLOF: Independent with household mobility with device Was using a cane due to knee collapsing   PATIENT GOALS  To have less pain and to start being able to move again   OBJECTIVE:   DIAGNOSTIC FINDINGS:  Nothing post op   PATIENT SURVEYS:  FOTO    COGNITION:  Overall cognitive status: Within functional limits for tasks assessed     SENSATION: Patient reports she does not think the block has worn off yet   EDEMA:  Circumferential: 53.6 48.3   POSTURE: rounded shoulders  PALPATION: No unexpected tenderness to palpation  LOWER EXTREMITY ROM:  Passive ROM Right eval Left eval 10/20 Right    Hip flexion      Hip extension      Hip abduction      Hip adduction      Hip internal rotation      Hip external rotation      Knee flexion 50 with pain and guarding  65 68  Knee extension -6  -4 -4  Ankle dorsiflexion      Ankle plantarflexion      Ankle inversion      Ankle eversion       (Blank rows = not tested)  LOWER EXTREMITY MMT:  MMT Right eval Left eval  Hip flexion 2/5   Hip extension    Hip abduction    Hip adduction    Hip internal rotation    Hip external rotation    Knee flexion 3/5     Knee extension 2/5   Ankle dorsiflexion    Ankle plantarflexion    Ankle inversion    Ankle eversion     (Blank rows = not tested)  FUNCTIONAL TESTS:  Needs hands to stand and gaurding  Needs assist to get right leg onto the table.   GAIT: Decreased single leg stance on the right. Increased lean on the walker and trunk flexion; increased lateral movement away form the hip.    TODAY'S TREATMENT: 11/1 Manual: PROM into flexion and extension; trigger point release and STM to posterior knee and IT band;  ; patella heat and creep mobilization  PA and AP grade III and IV mobilization to the knee to improve flexion and extension;  mulligan flexion mobilization with strap  Quad set 3x10   SLR 3x10  SAQ 3x10   Standing heel/toe x20  Standing slow march 210   Step up 2 inch 2x10  Standing side step 2x10    10/30 Manual: PROM into flexion and extension; trigger point release and STM to posterior knee and IT band;  ; patella heat and creep mobilization  PA and AP  grade III and IV mobilization to the knee to improve flexion and extension    Vaso: 32 degrees low 5 min        PATIENT EDUCATION:  Education details: reviewed HEP and symptom management  Person educated: Patient Education method: Explanation, Demonstration, Tactile cues, Verbal cues, and Handouts Education comprehension: verbalized understanding, returned demonstration, verbal cues required, tactile cues required, and needs further education   HOME EXERCISE PROGRAM: Access Code: [email or web address] URL: [email or web address] Date: 11/03/2021 Prepared by: Carolyne Littles  Exercises - Supine Quad Set  - 5 x daily - 7 x weekly - 3 sets - 10 reps - 4-5 sec hold  hold - Supine Heel Slide with Strap  - 1 x daily - 7 x weekly - 3 sets - 5 reps - 10 sec  hold - Seated Ankle Pumps  - 5 x daily - 7 x weekly - 3 sets - 10 reps  ASSESSMENT:  CLINICAL IMPRESSION: The patients flexion improved to 82 degrees today after manual therapy. Overall she is making progress. She has had very little pain over the past few days. She tolerated stretching better today with less guarding. We were also able to initiate stair training today. We will continue to advance standing exercises and functional training as tolerated. She was advised to continue aggressive stretching at home.   OBJECTIVE IMPAIRMENTS Abnormal gait, decreased activity tolerance, decreased balance, decreased endurance, decreased mobility, difficulty walking, decreased ROM, decreased strength, impaired perceived functional ability, and pain.   ACTIVITY LIMITATIONS carrying, lifting, bending, sitting, standing, squatting, stairs, transfers, bed mobility,  and locomotion level  PARTICIPATION LIMITATIONS: meal prep, cleaning, laundry, driving, shopping, community activity, and yard work  PERSONAL FACTORS 1-2 comorbidities: right RTC repair; prior right knee surgery   are also affecting patient's functional outcome.   REHAB POTENTIAL: Good significant pain   CLINICAL DECISION MAKING: Evolving/moderate complexity pain and swelling limiting her ability to get in an out of the car and bed   EVALUATION COMPLEXITY: Moderate   GOALS: Goals reviewed with patient? Yes  SHORT TERM GOALS: Target date: 12/02/2021  Patient will increase passive flexion to 75 degrees  Baseline: Goal status: INITIAL  2. Patient will increase hip flexor and knee extensor strength to 3+/5  Baseline:  Goal status: INITIAL  3.  Patient will demonstrate a 2 cm decrease in mid patella edema on the right  Baseline:  Goal status: INITIAL   LONG TERM GOALS: Target date: 12/02/2021   Patient will got up/down 6 steps without pain  Baseline:  Goal status: INITIAL  2.  Patient will bend knee 115 degrees without pain in order to get in and out of the car comfortably  Baseline:  Goal status: INITIAL  3.  Patient will ambulate 2000' with self report of no increase in pain with LRAD Baseline:  Goal status: INITIAL   PLAN: PT FREQUENCY: 2x/week  PT DURATION: 6 weeks  PLANNED INTERVENTIONS: Therapeutic exercises, Therapeutic activity, Neuromuscular re-education, Balance training, Gait training, Patient/Family education, Self Care, Joint mobilization, DME instructions, Aquatic Therapy, Electrical stimulation, Cryotherapy, Moist heat, Taping, Ultrasound, and Manual therapy  PLAN FOR NEXT SESSION: Patient may look ito home health In the meantime she was strongly advised to continue with OP rehab as her knee range is limited and gaurded. Continue with mobilization of the knee and progress gait. Add in standing exercise as tolerated.    Carney Living, PT 11/17/2021,  10:05 AM

## 2021-11-24 ENCOUNTER — Encounter (HOSPITAL_BASED_OUTPATIENT_CLINIC_OR_DEPARTMENT_OTHER): Payer: Self-pay | Admitting: Physical Therapy

## 2021-11-24 ENCOUNTER — Ambulatory Visit (HOSPITAL_BASED_OUTPATIENT_CLINIC_OR_DEPARTMENT_OTHER): Payer: Medicare Other | Admitting: Physical Therapy

## 2021-11-24 DIAGNOSIS — M25561 Pain in right knee: Secondary | ICD-10-CM

## 2021-11-24 DIAGNOSIS — R2689 Other abnormalities of gait and mobility: Secondary | ICD-10-CM

## 2021-11-24 DIAGNOSIS — R600 Localized edema: Secondary | ICD-10-CM

## 2021-11-24 DIAGNOSIS — M25661 Stiffness of right knee, not elsewhere classified: Secondary | ICD-10-CM

## 2021-11-24 NOTE — Therapy (Signed)
OUTPATIENT PHYSICAL THERAPY LOWER EXTREMITY Treatment    Patient Name: Alicia [name] MRN: [id number] DOB:12/06/1955, 66 y.o., female Today's Date: 11/17/2021   PT End of Session - 11/17/21 0856     Visit Number 6    Number of Visits 16    Date for PT Re-Evaluation 12/28/21    Authorization Type Medicare    PT Start Time 0800    PT Stop Time 0848    PT Time Calculation (min) 48 min    Activity Tolerance Patient tolerated treatment well    Behavior During Therapy [facility] for tasks assessed/performed             Past Medical History:  Diagnosis Date   Allergy    Cataract    removed bilateral -2014   Chronic tension headaches    Hx of varicella    Hypertension    Kidney stones    Near syncope    Nephrolithiasis    history of  passed on own.    Palpitations    Thyroid disease    pt. denies 06/11/19   Tubular adenoma of colon 02/1997   w/ HGD   Varicose veins    Past Surgical History:  Procedure Laterality Date   ABDOMINAL HYSTERECTOMY     fibroids non cancer    CATARACT EXTRACTION     Bilateral   cataract surgery Right 03/02/2012   CHOLECYSTECTOMY     KNEE SURGERY  04/2020   KNEE SURGERY     ROTATOR CUFF REPAIR Right    TONSILLECTOMY     Patient Active Problem List   Diagnosis Date Noted   Hypokalemia 01/25/2017   Thyroid disease    Palpitations    Nephrolithiasis    Near syncope    Kidney stones    Hypertension    Hx of varicella    Chronic tension headaches    Allergy    Nocturnal headaches 05/04/2016   Coccygodynia 01/29/2015   Atypical chest pain 09/09/2014   Essential hypertension 09/09/2014   Midline low back pain without sciatica 09/05/2013   Renal cyst 09/05/2013   Fatty infiltration of liver 63/01/6008   Umbilical hernia without obstruction and without gangrene 09/05/2013   Flank pain 07/29/2013   Encounter to establish care with new doctor 07/11/2013   Left leg pain 07/11/2013   Cough 01/22/2013   Swelling in head/neck 10/31/2012    Routine health maintenance 04/05/2012   Unspecified vitamin D deficiency 04/04/2012   Varicose veins of lower extremities with other complications 93/23/5573   Chronic venous insufficiency 09/02/2011   Allergic rhinitis, cause unspecified 06/08/2011   Paresthesia of left arm 06/08/2011   Essential hypertension, benign 03/26/2010   OBESITY, CLASS III 10/02/2009   Other and unspecified coagulation defects 04/03/2009   PERIPHERAL EDEMA 10/22/2008   HEMORRHOIDS, INTERNAL, WITH BLEEDING 05/07/2008   NEPHROLITHIASIS, HX OF 04/28/2008   SYNCOPE 04/19/2007   ANXIETY DEPRESSION 10/31/2006   HYSTERECTOMY, VAGINAL, HX OF 10/31/2006    PCP: Dr Shanon Ace   REFERRING PROVIDER: Laqueta Jean PA-C  REFERRING DIAG:  [phone] (ICD-10-CM) - Idiopathic aseptic necrosis of right femur  Right UKA   THERAPY DIAG:  Acute pain of right knee  Stiffness of right knee, not elsewhere classified  Localized edema  Other abnormalities of gait and mobility  Rationale for Evaluation and Treatment Rehabilitation  ONSET DATE: 10/26/2021  Days since surgery: 10/26/2021  SUBJECTIVE:   SUBJECTIVE STATEMENT: The patient reports that yesterday evening she had significant pain in the knee. She  had to take her medication.    Eval  Patient had a unicompartmental knee replacement on 10/10. At this time she is limited by swelling. She has difficulty getting in and out of the car.  She had necrosis in her knee prior to surgery. She used a cane 2nd to the instability. She is using a walker now.   PERTINENT HISTORY: Right RTC repair; Right meniscal repair;   PAIN:  Are you having pain? Yes: NPRS scale: 4/10 10/26 Pain location: right lateral knee and posterior knee  Pain description: stiffness   Aggravating factors: Standing , walking, activity and bending  Relieving factors: oxycodone   PRECAUTIONS: None  WEIGHT BEARING RESTRICTIONS Yes WBAT   FALLS:  Has patient fallen in last 6 months?  No  LIVING ENVIRONMENT: No steps into her house  OCCUPATION: retried   PLOF: Independent with household mobility with device Was using a cane due to knee collapsing   PATIENT GOALS  To have less pain and to start being able to move again   OBJECTIVE:   DIAGNOSTIC FINDINGS:  Nothing post op   PATIENT SURVEYS:  FOTO    COGNITION:  Overall cognitive status: Within functional limits for tasks assessed     SENSATION: Patient reports she does not think the block has worn off yet   EDEMA:  Circumferential: 53.6 48.3   POSTURE: rounded shoulders  PALPATION: No unexpected tenderness to palpation  LOWER EXTREMITY ROM:  Passive ROM Right eval Left eval 10/20 Right  Right   Hip flexion      Hip extension      Hip abduction      Hip adduction      Hip internal rotation      Hip external rotation      Knee flexion 50 with pain and guarding  65 80  Knee extension -6  -4 -2  Ankle dorsiflexion      Ankle plantarflexion      Ankle inversion      Ankle eversion       (Blank rows = not tested)  LOWER EXTREMITY MMT:  MMT Right eval Left eval  Hip flexion 2/5   Hip extension    Hip abduction    Hip adduction    Hip internal rotation    Hip external rotation    Knee flexion 3/5     Knee extension 2/5   Ankle dorsiflexion    Ankle plantarflexion    Ankle inversion    Ankle eversion     (Blank rows = not tested)  FUNCTIONAL TESTS:  Needs hands to stand and gaurding  Needs assist to get right leg onto the table.   GAIT: Decreased single leg stance on the right. Increased lean on the walker and trunk flexion; increased lateral movement away form the hip.    TODAY'S TREATMENT: 11/8 Manual: PROM into flexion and extension; trigger point release and STM to posterior knee and IT band;  ; patella heat and creep mobilization  PA and AP grade III and IV mobilization to the knee to improve flexion and extension;  mulligan flexion mobilization with strap  Quad set  3x10  SLR 3x10  SAQ 3x10   Standing heel/toe x20  Standing slow march 210   Step up 2 inch 2x10  Standing side step 2x10   11/1 Manual: PROM into flexion and extension; trigger point release and STM to posterior knee and IT band;  ; patella heat and creep mobilization  PA and AP  grade III and IV mobilization to the knee to improve flexion and extension;  mulligan flexion mobilization with strap  Quad set 3x10  SLR 3x10  SAQ 3x10   Standing heel/toe x20  Standing slow march 210   Step up 2 inch 2x10  Standing side step 2x10    10/30 Manual: PROM into flexion and extension; trigger point release and STM to posterior knee and IT band;  ; patella heat and creep mobilization  PA and AP grade III and IV mobilization to the knee to improve flexion and extension    Vaso: 32 degrees low 5 min        PATIENT EDUCATION:  Education details: reviewed HEP and symptom management  Person educated: Patient Education method: Explanation, Demonstration, Tactile cues, Verbal cues, and Handouts Education comprehension: verbalized understanding, returned demonstration, verbal cues required, tactile cues required, and needs further education   HOME EXERCISE PROGRAM: Access Code: [email or web address] URL: [email or web address] Date: 11/03/2021 Prepared by: Carolyne Littles  Exercises - Supine Quad Set  - 5 x daily - 7 x weekly - 3 sets - 10 reps - 4-5 sec hold  hold - Supine Heel Slide with Strap  - 1 x daily - 7 x weekly - 3 sets - 5 reps - 10 sec  hold - Seated Ankle Pumps  - 5 x daily - 7 x weekly - 3 sets - 10 reps  ASSESSMENT:  CLINICAL IMPRESSION: The patients ROM was measured at 2-80 today. She continues to make slow but steady progress with her flexion. She was advised to continue to be aggressive with it at home. We worked on IASTYM to the distal quad. She feels like there is a band of tightness across the distal quad. We added weights to some of her exercises today. We also  continue to advance steps and functional standing mobility as tolerated. She will see the MD after thanksgiving.   OBJECTIVE IMPAIRMENTS Abnormal gait, decreased activity tolerance, decreased balance, decreased endurance, decreased mobility, difficulty walking, decreased ROM, decreased strength, impaired perceived functional ability, and pain.   ACTIVITY LIMITATIONS carrying, lifting, bending, sitting, standing, squatting, stairs, transfers, bed mobility, and locomotion level  PARTICIPATION LIMITATIONS: meal prep, cleaning, laundry, driving, shopping, community activity, and yard work  PERSONAL FACTORS 1-2 comorbidities: right RTC repair; prior right knee surgery   are also affecting patient's functional outcome.   REHAB POTENTIAL: Good significant pain   CLINICAL DECISION MAKING: Evolving/moderate complexity pain and swelling limiting her ability to get in an out of the car and bed   EVALUATION COMPLEXITY: Moderate   GOALS: Goals reviewed with patient? Yes  SHORT TERM GOALS: Target date: 12/02/2021  Patient will increase passive flexion to 75 degrees  Baseline: Goal status: INITIAL  2. Patient will increase hip flexor and knee extensor strength to 3+/5  Baseline:  Goal status: INITIAL  3.  Patient will demonstrate a 2 cm decrease in mid patella edema on the right  Baseline:  Goal status: INITIAL   LONG TERM GOALS: Target date: 12/02/2021   Patient will got up/down 6 steps without pain  Baseline:  Goal status: INITIAL  2.  Patient will bend knee 115 degrees without pain in order to get in and out of the car comfortably  Baseline:  Goal status: INITIAL  3.  Patient will ambulate 2000' with self report of no increase in pain with LRAD Baseline:  Goal status: INITIAL   PLAN: PT FREQUENCY: 2x/week  PT DURATION: 6 weeks  PLANNED INTERVENTIONS: Therapeutic exercises,  Therapeutic activity, Neuromuscular re-education, Balance training, Gait training, Patient/Family  education, Self Care, Joint mobilization, DME instructions, Aquatic Therapy, Electrical stimulation, Cryotherapy, Moist heat, Taping, Ultrasound, and Manual therapy  PLAN FOR NEXT SESSION: Patient may look ito home health In the meantime she was strongly advised to continue with OP rehab as her knee range is limited and gaurded. Continue with mobilization of the knee and progress gait. Add in standing exercise as tolerated.    Carney Living, PT 11/17/2021, 10:05 AM

## 2021-11-26 ENCOUNTER — Encounter (HOSPITAL_BASED_OUTPATIENT_CLINIC_OR_DEPARTMENT_OTHER): Payer: Self-pay | Admitting: Physical Therapy

## 2021-11-26 ENCOUNTER — Ambulatory Visit (HOSPITAL_BASED_OUTPATIENT_CLINIC_OR_DEPARTMENT_OTHER): Payer: Medicare Other | Admitting: Physical Therapy

## 2021-11-26 DIAGNOSIS — R2689 Other abnormalities of gait and mobility: Secondary | ICD-10-CM

## 2021-11-26 DIAGNOSIS — M25561 Pain in right knee: Secondary | ICD-10-CM | POA: Diagnosis not present

## 2021-11-26 DIAGNOSIS — M25661 Stiffness of right knee, not elsewhere classified: Secondary | ICD-10-CM

## 2021-11-26 DIAGNOSIS — R600 Localized edema: Secondary | ICD-10-CM

## 2021-11-26 NOTE — Therapy (Signed)
OUTPATIENT PHYSICAL THERAPY LOWER EXTREMITY Treatment    Patient Name: Alicia [name] MRN: [id number] DOB:[birth year], 66 y.o., female Today's Date: 11/17/2021   PT End of Session - 11/17/21 0856     Visit Number 6    Number of Visits 16    Date for PT Re-Evaluation 12/28/21    Authorization Type Medicare    PT Start Time 0800    PT Stop Time 0848    PT Time Calculation (min) 48 min    Activity Tolerance Patient tolerated treatment well    Behavior During Therapy [facility] for tasks assessed/performed             Past Medical History:  Diagnosis Date   Allergy    Cataract    removed bilateral -2014   Chronic tension headaches    Hx of varicella    Hypertension    Kidney stones    Near syncope    Nephrolithiasis    history of  passed on own.    Palpitations    Thyroid disease    pt. denies 06/11/19   Tubular adenoma of colon 02/1997   w/ HGD   Varicose veins    Past Surgical History:  Procedure Laterality Date   ABDOMINAL HYSTERECTOMY     fibroids non cancer    CATARACT EXTRACTION     Bilateral   cataract surgery Right 03/02/2012   CHOLECYSTECTOMY     KNEE SURGERY  04/2020   KNEE SURGERY     ROTATOR CUFF REPAIR Right    TONSILLECTOMY     Patient Active Problem List   Diagnosis Date Noted   Hypokalemia 01/25/2017   Thyroid disease    Palpitations    Nephrolithiasis    Near syncope    Kidney stones    Hypertension    Hx of varicella    Chronic tension headaches    Allergy    Nocturnal headaches 05/04/2016   Coccygodynia 01/29/2015   Atypical chest pain 09/09/2014   Essential hypertension 09/09/2014   Midline low back pain without sciatica 09/05/2013   Renal cyst 09/05/2013   Fatty infiltration of liver 56/25/6389   Umbilical hernia without obstruction and without gangrene 09/05/2013   Flank pain 07/29/2013   Encounter to establish care with new doctor 07/11/2013   Left leg pain 07/11/2013   Cough 01/22/2013   Swelling in head/neck 10/31/2012    Routine health maintenance 04/05/2012   Unspecified vitamin D deficiency 04/04/2012   Varicose veins of lower extremities with other complications 37/34/2876   Chronic venous insufficiency 09/02/2011   Allergic rhinitis, cause unspecified 06/08/2011   Paresthesia of left arm 06/08/2011   Essential hypertension, benign 03/26/2010   OBESITY, CLASS III 10/02/2009   Other and unspecified coagulation defects 04/03/2009   PERIPHERAL EDEMA 10/22/2008   HEMORRHOIDS, INTERNAL, WITH BLEEDING 05/07/2008   NEPHROLITHIASIS, HX OF 04/28/2008   SYNCOPE 04/19/2007   ANXIETY DEPRESSION 10/31/2006   HYSTERECTOMY, VAGINAL, HX OF 10/31/2006    PCP: Dr Shanon Ace   REFERRING PROVIDER: Laqueta Jean PA-C  REFERRING DIAG:  [phone] (ICD-10-CM) - Idiopathic aseptic necrosis of right femur  Right UKA   THERAPY DIAG:  Acute pain of right knee  Stiffness of right knee, not elsewhere classified  Localized edema  Other abnormalities of gait and mobility  Rationale for Evaluation and Treatment Rehabilitation  ONSET DATE: 10/26/2021  Days since surgery: 10/26/2021  SUBJECTIVE:   SUBJECTIVE STATEMENT: The patient reports that yesterday evening she had significant pain in the knee. She  had to take her medication.    Eval  Patient had a unicompartmental knee replacement on 10/10. At this time she is limited by swelling. She has difficulty getting in and out of the car.  She had necrosis in her knee prior to surgery. She used a cane 2nd to the instability. She is using a walker now.   PERTINENT HISTORY: Right RTC repair; Right meniscal repair;   PAIN:  Are you having pain? Yes: NPRS scale: 4/10 10/26 Pain location: right lateral knee and posterior knee  Pain description: stiffness   Aggravating factors: Standing , walking, activity and bending  Relieving factors: oxycodone   PRECAUTIONS: None  WEIGHT BEARING RESTRICTIONS Yes WBAT   FALLS:  Has patient fallen in last 6 months?  No  LIVING ENVIRONMENT: No steps into her house  OCCUPATION: retried   PLOF: Independent with household mobility with device Was using a cane due to knee collapsing   PATIENT GOALS  To have less pain and to start being able to move again   OBJECTIVE:   DIAGNOSTIC FINDINGS:  Nothing post op   PATIENT SURVEYS:  FOTO    COGNITION:  Overall cognitive status: Within functional limits for tasks assessed     SENSATION: Patient reports she does not think the block has worn off yet   EDEMA:  Circumferential: 53.6 48.3   POSTURE: rounded shoulders  PALPATION: No unexpected tenderness to palpation  LOWER EXTREMITY ROM:  Passive ROM Right eval Left eval 10/20 Right  Right   Hip flexion      Hip extension      Hip abduction      Hip adduction      Hip internal rotation      Hip external rotation      Knee flexion 50 with pain and guarding  65 80  Knee extension -6  -4 -2  Ankle dorsiflexion      Ankle plantarflexion      Ankle inversion      Ankle eversion       (Blank rows = not tested)  LOWER EXTREMITY MMT:  MMT Right eval Left eval  Hip flexion 2/5   Hip extension    Hip abduction    Hip adduction    Hip internal rotation    Hip external rotation    Knee flexion 3/5     Knee extension 2/5   Ankle dorsiflexion    Ankle plantarflexion    Ankle inversion    Ankle eversion     (Blank rows = not tested)  FUNCTIONAL TESTS:  Needs hands to stand and gaurding  Needs assist to get right leg onto the table.   GAIT: Decreased single leg stance on the right. Increased lean on the walker and trunk flexion; increased lateral movement away form the hip.    TODAY'S TREATMENT: 11/10   patella heat and creep mobilization  PA and AP grade III and IV mobilization to the knee to improve flexion and extension;  mulligan flexion mobilization with strap in probe position;  IASTYM to quad   SLR 3x10  LAQ 3x10 1lb   Step stretch for flexion x10 5 sec hold   Step up 2x10 4 inch     11/8 Manual: PROM into flexion and extension; trigger point release and STM to posterior knee and IT band;  ; patella heat and creep mobilization  PA and AP grade III and IV mobilization to the knee to improve flexion and extension;  mulligan flexion  mobilization with strap  Quad set 3x10  SLR 3x10  SAQ 3x10   Standing heel/toe x20  Standing slow march 210   Step up 2 inch 2x10  Standing side step 2x10      PATIENT EDUCATION:  Education details: reviewed HEP and symptom management  Person educated: Patient Education method: Explanation, Demonstration, Tactile cues, Verbal cues, and Handouts Education comprehension: verbalized understanding, returned demonstration, verbal cues required, tactile cues required, and needs further education   HOME EXERCISE PROGRAM: Access Code: [email or web address] URL: [email or web address] Date: 11/03/2021 Prepared by: Carolyne Littles  Exercises - Supine Quad Set  - 5 x daily - 7 x weekly - 3 sets - 10 reps - 4-5 sec hold  hold - Supine Heel Slide with Strap  - 1 x daily - 7 x weekly - 3 sets - 5 reps - 10 sec  hold - Seated Ankle Pumps  - 5 x daily - 7 x weekly - 3 sets - 10 reps  ASSESSMENT:  CLINICAL IMPRESSION: The patient is making progress. Her flexion is still limited but every visit she gets a little more. She is walking around her house and short distances without her cane at home. We worked on aggressive flexion stretching today. We performed the Mulligan strap mobilization from a prone position. After stretching she had 86 degrees of flexion> She was advised to maintain this at home. She was also advised if it swells to ice it. She did mentaion a brief period of calf pain yesterday< She had no signs of DVT and it went away quickly. She was advised to montiro closely at home and contact the MD if needed.  OBJECTIVE IMPAIRMENTS Abnormal gait, decreased activity tolerance, decreased balance, decreased  endurance, decreased mobility, difficulty walking, decreased ROM, decreased strength, impaired perceived functional ability, and pain.   ACTIVITY LIMITATIONS carrying, lifting, bending, sitting, standing, squatting, stairs, transfers, bed mobility, and locomotion level  PARTICIPATION LIMITATIONS: meal prep, cleaning, laundry, driving, shopping, community activity, and yard work  PERSONAL FACTORS 1-2 comorbidities: right RTC repair; prior right knee surgery   are also affecting patient's functional outcome.   REHAB POTENTIAL: Good significant pain   CLINICAL DECISION MAKING: Evolving/moderate complexity pain and swelling limiting her ability to get in an out of the car and bed   EVALUATION COMPLEXITY: Moderate   GOALS: Goals reviewed with patient? Yes  SHORT TERM GOALS: Target date: 12/02/2021  Patient will increase passive flexion to 75 degrees  Baseline: Goal status: INITIAL  2. Patient will increase hip flexor and knee extensor strength to 3+/5  Baseline:  Goal status: INITIAL  3.  Patient will demonstrate a 2 cm decrease in mid patella edema on the right  Baseline:  Goal status: INITIAL   LONG TERM GOALS: Target date: 12/02/2021   Patient will got up/down 6 steps without pain  Baseline:  Goal status: INITIAL  2.  Patient will bend knee 115 degrees without pain in order to get in and out of the car comfortably  Baseline:  Goal status: INITIAL  3.  Patient will ambulate 2000' with self report of no increase in pain with LRAD Baseline:  Goal status: INITIAL   PLAN: PT FREQUENCY: 2x/week  PT DURATION: 6 weeks  PLANNED INTERVENTIONS: Therapeutic exercises, Therapeutic activity, Neuromuscular re-education, Balance training, Gait training, Patient/Family education, Self Care, Joint mobilization, DME instructions, Aquatic Therapy, Electrical stimulation, Cryotherapy, Moist heat, Taping, Ultrasound, and Manual therapy  PLAN FOR NEXT SESSION: Patient may look ito home  health In the meantime  she was strongly advised to continue with OP rehab as her knee range is limited and gaurded. Continue with mobilization of the knee and progress gait. Add in standing exercise as tolerated.    Carney Living, PT 11/17/2021, 10:05 AM

## 2021-12-01 ENCOUNTER — Ambulatory Visit (HOSPITAL_BASED_OUTPATIENT_CLINIC_OR_DEPARTMENT_OTHER): Payer: Medicare Other | Admitting: Physical Therapy

## 2021-12-01 DIAGNOSIS — M25561 Pain in right knee: Secondary | ICD-10-CM | POA: Diagnosis not present

## 2021-12-01 DIAGNOSIS — M25661 Stiffness of right knee, not elsewhere classified: Secondary | ICD-10-CM

## 2021-12-01 DIAGNOSIS — R600 Localized edema: Secondary | ICD-10-CM

## 2021-12-01 DIAGNOSIS — R2689 Other abnormalities of gait and mobility: Secondary | ICD-10-CM

## 2021-12-01 NOTE — Therapy (Signed)
OUTPATIENT PHYSICAL THERAPY LOWER EXTREMITY Treatment    Patient Name: Alicia [name] MRN: [id number] DOB:24-Nov-1955, 66 y.o., female Today's Date: 11/17/2021   PT End of Session - 11/17/21 0856     Visit Number 6    Number of Visits 16    Date for PT Re-Evaluation 12/28/21    Authorization Type Medicare    PT Start Time 0800    PT Stop Time 0848    PT Time Calculation (min) 48 min    Activity Tolerance Patient tolerated treatment well    Behavior During Therapy [facility] for tasks assessed/performed             Past Medical History:  Diagnosis Date   Allergy    Cataract    removed bilateral -2014   Chronic tension headaches    Hx of varicella    Hypertension    Kidney stones    Near syncope    Nephrolithiasis    history of  passed on own.    Palpitations    Thyroid disease    pt. denies 06/11/19   Tubular adenoma of colon 02/1997   w/ HGD   Varicose veins    Past Surgical History:  Procedure Laterality Date   ABDOMINAL HYSTERECTOMY     fibroids non cancer    CATARACT EXTRACTION     Bilateral   cataract surgery Right 03/02/2012   CHOLECYSTECTOMY     KNEE SURGERY  04/2020   KNEE SURGERY     ROTATOR CUFF REPAIR Right    TONSILLECTOMY     Patient Active Problem List   Diagnosis Date Noted   Hypokalemia 01/25/2017   Thyroid disease    Palpitations    Nephrolithiasis    Near syncope    Kidney stones    Hypertension    Hx of varicella    Chronic tension headaches    Allergy    Nocturnal headaches 05/04/2016   Coccygodynia 01/29/2015   Atypical chest pain 09/09/2014   Essential hypertension 09/09/2014   Midline low back pain without sciatica 09/05/2013   Renal cyst 09/05/2013   Fatty infiltration of liver 61/60/7371   Umbilical hernia without obstruction and without gangrene 09/05/2013   Flank pain 07/29/2013   Encounter to establish care with new doctor 07/11/2013   Left leg pain 07/11/2013   Cough 01/22/2013   Swelling in head/neck 10/31/2012    Routine health maintenance 04/05/2012   Unspecified vitamin D deficiency 04/04/2012   Varicose veins of lower extremities with other complications 07/13/9483   Chronic venous insufficiency 09/02/2011   Allergic rhinitis, cause unspecified 06/08/2011   Paresthesia of left arm 06/08/2011   Essential hypertension, benign 03/26/2010   OBESITY, CLASS III 10/02/2009   Other and unspecified coagulation defects 04/03/2009   PERIPHERAL EDEMA 10/22/2008   HEMORRHOIDS, INTERNAL, WITH BLEEDING 05/07/2008   NEPHROLITHIASIS, HX OF 04/28/2008   SYNCOPE 04/19/2007   ANXIETY DEPRESSION 10/31/2006   HYSTERECTOMY, VAGINAL, HX OF 10/31/2006    PCP: Dr Shanon Ace   REFERRING PROVIDER: Laqueta Jean PA-C  REFERRING DIAG:  [phone] (ICD-10-CM) - Idiopathic aseptic necrosis of right femur  Right UKA   THERAPY DIAG:  Acute pain of right knee  Stiffness of right knee, not elsewhere classified  Localized edema  Other abnormalities of gait and mobility  Rationale for Evaluation and Treatment Rehabilitation  ONSET DATE: 10/26/2021  Days since surgery: 10/26/2021  SUBJECTIVE:   SUBJECTIVE STATEMENT: The patient reports it has been sore at night. She did some walking outside  over the weekend.   Eval  Patient had a unicompartmental knee replacement on 10/10. At this time she is limited by swelling. She has difficulty getting in and out of the car.  She had necrosis in her knee prior to surgery. She used a cane 2nd to the instability. She is using a walker now.   PERTINENT HISTORY: Right RTC repair; Right meniscal repair;   PAIN:  Are you having pain? Yes: NPRS scale: 2/10 11/15 Pain location: right lateral knee and posterior knee  Pain description: stiffness   Aggravating factors: Standing , walking, activity and bending  Relieving factors: oxycodone   PRECAUTIONS: None  WEIGHT BEARING RESTRICTIONS Yes WBAT   FALLS:  Has patient fallen in last 6 months? No  LIVING ENVIRONMENT: No  steps into her house  OCCUPATION: retried   PLOF: Independent with household mobility with device Was using a cane due to knee collapsing   PATIENT GOALS  To have less pain and to start being able to move again   OBJECTIVE:   DIAGNOSTIC FINDINGS:  Nothing post op   PATIENT SURVEYS:  FOTO    COGNITION:  Overall cognitive status: Within functional limits for tasks assessed     SENSATION: Patient reports she does not think the block has worn off yet   EDEMA:  Circumferential: 53.6 48.3   POSTURE: rounded shoulders  PALPATION: No unexpected tenderness to palpation  LOWER EXTREMITY ROM:  Passive ROM Right eval Left eval 10/20 Right  Right  11/15  Hip flexion      Hip extension      Hip abduction      Hip adduction      Hip internal rotation      Hip external rotation      Knee flexion 50 with pain and guarding  65 85  Knee extension -6  -4 -2  Ankle dorsiflexion      Ankle plantarflexion      Ankle inversion      Ankle eversion       (Blank rows = not tested)  LOWER EXTREMITY MMT:  MMT Right eval Left eval  Hip flexion 2/5   Hip extension    Hip abduction    Hip adduction    Hip internal rotation    Hip external rotation    Knee flexion 3/5     Knee extension 2/5   Ankle dorsiflexion    Ankle plantarflexion    Ankle inversion    Ankle eversion     (Blank rows = not tested)  FUNCTIONAL TESTS:  Needs hands to stand and gaurding  Needs assist to get right leg onto the table.   GAIT: Decreased single leg stance on the right. Increased lean on the walker and trunk flexion; increased lateral movement away form the hip.    TODAY'S TREATMENT: 11/15  patella heat and creep mobilization  PA and AP grade III and IV mobilization to the knee to improve flexion and extension;  mulligan flexion mobilization with strap in probe position;  IASTYM to quad   SLR 3x10 1.5 LAQ 3x10 1.5lb    Step stretch for flexion x10 5 sec hold  Step up 2x10 4 inch    11/10   patella heat and creep mobilization  PA and AP grade III and IV mobilization to the knee to improve flexion and extension;  mulligan flexion mobilization with strap in probe position;  IASTYM to quad   SLR 3x10  LAQ 3x10 1lb   Step  stretch for flexion x10 5 sec hold  Step up 2x10 4 inch     11/8 Manual: PROM into flexion and extension; trigger point release and STM to posterior knee and IT band;  ; patella heat and creep mobilization  PA and AP grade III and IV mobilization to the knee to improve flexion and extension;  mulligan flexion mobilization with strap  Quad set 3x10  SLR 3x10  SAQ 3x10   Standing heel/toe x20  Standing slow march 210   Step up 2 inch 2x10  Standing side step 2x10      PATIENT EDUCATION:  Education details: reviewed HEP and symptom management  Person educated: Patient Education method: Explanation, Demonstration, Tactile cues, Verbal cues, and Handouts Education comprehension: verbalized understanding, returned demonstration, verbal cues required, tactile cues required, and needs further education   HOME EXERCISE PROGRAM: Access Code: [email or web address] URL: [email or web address] Date: 11/03/2021 Prepared by: Carolyne Littles  Exercises - Supine Quad Set  - 5 x daily - 7 x weekly - 3 sets - 10 reps - 4-5 sec hold  hold - Supine Heel Slide with Strap  - 1 x daily - 7 x weekly - 3 sets - 5 reps - 10 sec  hold - Seated Ankle Pumps  - 5 x daily - 7 x weekly - 3 sets - 10 reps  ASSESSMENT:  CLINICAL IMPRESSION: The patient was tight and guarded at the beginning of treatment but improved with manual therapy. Her range was about the same today per visual inspection as the last visit but started tighter. We added weight to her SLR and added in squats. She required only min cuing to do a proper squat. She had no significant pain with her squat. She will continue with aggressive stretching at home.   OBJECTIVE IMPAIRMENTS Abnormal  gait, decreased activity tolerance, decreased balance, decreased endurance, decreased mobility, difficulty walking, decreased ROM, decreased strength, impaired perceived functional ability, and pain.   ACTIVITY LIMITATIONS carrying, lifting, bending, sitting, standing, squatting, stairs, transfers, bed mobility, and locomotion level  PARTICIPATION LIMITATIONS: meal prep, cleaning, laundry, driving, shopping, community activity, and yard work  PERSONAL FACTORS 1-2 comorbidities: right RTC repair; prior right knee surgery   are also affecting patient's functional outcome.   REHAB POTENTIAL: Good significant pain   CLINICAL DECISION MAKING: Evolving/moderate complexity pain and swelling limiting her ability to get in an out of the car and bed   EVALUATION COMPLEXITY: Moderate   GOALS: Goals reviewed with patient? Yes  SHORT TERM GOALS: Target date: 12/02/2021  Patient will increase passive flexion to 75 degrees  Baseline: Goal status: achieved 11/15  2. Patient will increase hip flexor and knee extensor strength to 3+/5  Baseline:  Goal status:Improving 11/14   3.  Patient will demonstrate a 2 cm decrease in mid patella edema on the right  Baseline:  Goal status: per visual inspection significant improvement in swelling    LONG TERM GOALS: Target date: 12/02/2021   Patient will got up/down 6 steps without pain  Baseline:  Goal status: INITIAL  2.  Patient will bend knee 115 degrees without pain in order to get in and out of the car comfortably  Baseline:  Goal status: INITIAL  3.  Patient will ambulate 2000' with self report of no increase in pain with LRAD Baseline:  Goal status: INITIAL   PLAN: PT FREQUENCY: 2x/week  PT DURATION: 6 weeks  PLANNED INTERVENTIONS: Therapeutic exercises, Therapeutic activity, Neuromuscular re-education, Balance training, Gait training, Patient/Family education, Self Care,  Joint mobilization, DME instructions, Aquatic Therapy, Electrical  stimulation, Cryotherapy, Moist heat, Taping, Ultrasound, and Manual therapy  PLAN FOR NEXT SESSION: continue to progress exercises as tolerated.    Carney Living, PT 11/17/2021, 10:05 AM

## 2021-12-03 ENCOUNTER — Encounter (HOSPITAL_BASED_OUTPATIENT_CLINIC_OR_DEPARTMENT_OTHER): Payer: Self-pay | Admitting: Physical Therapy

## 2021-12-03 ENCOUNTER — Ambulatory Visit (HOSPITAL_BASED_OUTPATIENT_CLINIC_OR_DEPARTMENT_OTHER): Payer: Medicare Other | Admitting: Physical Therapy

## 2021-12-03 DIAGNOSIS — M25561 Pain in right knee: Secondary | ICD-10-CM

## 2021-12-03 DIAGNOSIS — M25661 Stiffness of right knee, not elsewhere classified: Secondary | ICD-10-CM

## 2021-12-03 DIAGNOSIS — R2689 Other abnormalities of gait and mobility: Secondary | ICD-10-CM

## 2021-12-03 DIAGNOSIS — R600 Localized edema: Secondary | ICD-10-CM

## 2021-12-03 NOTE — Therapy (Signed)
OUTPATIENT PHYSICAL THERAPY LOWER EXTREMITY Treatment/ Progress    Patient Name: Alicia [name] MRN: [id number] DOB:01/12/1956, 66 y.o., female Today's Date: 12/03/2021  Progress Note Reporting Period 11/02/2021 to 11/172023  See note below for Objective Data and Assessment of Progress/Goals.       PT End of Session - 12/03/21 0807     Visit Number 10    Number of Visits 16    Date for PT Re-Evaluation 12/28/21    Authorization Type re-assement next visit    PT Start Time 0800    PT Stop Time 0844    PT Time Calculation (min) 44 min    Activity Tolerance Patient tolerated treatment well    Behavior During Therapy [facility] for tasks assessed/performed             Past Medical History:  Diagnosis Date   Allergy    Cataract    removed bilateral -2014   Chronic tension headaches    Hx of varicella    Hypertension    Kidney stones    Near syncope    Nephrolithiasis    history of  passed on own.    Palpitations    Thyroid disease    pt. denies 06/11/19   Tubular adenoma of colon 02/1997   w/ HGD   Varicose veins    Past Surgical History:  Procedure Laterality Date   ABDOMINAL HYSTERECTOMY     fibroids non cancer    CATARACT EXTRACTION     Bilateral   cataract surgery Right 03/02/2012   CHOLECYSTECTOMY     KNEE SURGERY  04/2020   KNEE SURGERY     ROTATOR CUFF REPAIR Right    TONSILLECTOMY     Patient Active Problem List   Diagnosis Date Noted   Hypokalemia 01/25/2017   Thyroid disease    Palpitations    Nephrolithiasis    Near syncope    Kidney stones    Hypertension    Hx of varicella    Chronic tension headaches    Allergy    Nocturnal headaches 05/04/2016   Coccygodynia 01/29/2015   Atypical chest pain 09/09/2014   Essential hypertension 09/09/2014   Midline low back pain without sciatica 09/05/2013   Renal cyst 09/05/2013   Fatty infiltration of liver 17/49/4496   Umbilical hernia without obstruction and without gangrene 09/05/2013    Flank pain 07/29/2013   Encounter to establish care with new doctor 07/11/2013   Left leg pain 07/11/2013   Cough 01/22/2013   Swelling in head/neck 10/31/2012   Routine health maintenance 04/05/2012   Unspecified vitamin D deficiency 04/04/2012   Varicose veins of lower extremities with other complications 75/91/6384   Chronic venous insufficiency 09/02/2011   Allergic rhinitis, cause unspecified 06/08/2011   Paresthesia of left arm 06/08/2011   Essential hypertension, benign 03/26/2010   OBESITY, CLASS III 10/02/2009   Other and unspecified coagulation defects 04/03/2009   PERIPHERAL EDEMA 10/22/2008   HEMORRHOIDS, INTERNAL, WITH BLEEDING 05/07/2008   NEPHROLITHIASIS, HX OF 04/28/2008   SYNCOPE 04/19/2007   ANXIETY DEPRESSION 10/31/2006   HYSTERECTOMY, VAGINAL, HX OF 10/31/2006    PCP: Dr Shanon Ace   REFERRING PROVIDER: Laqueta Jean PA-C  REFERRING DIAG:  [phone] (ICD-10-CM) - Idiopathic aseptic necrosis of right femur  Right UKA   THERAPY DIAG:  Acute pain of right knee  Stiffness of right knee, not elsewhere classified  Localized edema  Other abnormalities of gait and mobility  Rationale for Evaluation and Treatment Rehabilitation  ONSET DATE:  10/26/2021  Days since surgery: 10/26/2021  SUBJECTIVE:   SUBJECTIVE STATEMENT: The patient reports her muscles were twitching last night. She has had some difficulty with potasium. She also is not sure she drank enough water last night.   Eval  Patient had a unicompartmental knee replacement on 10/10. At this time she is limited by swelling. She has difficulty getting in and out of the car.  She had necrosis in her knee prior to surgery. She used a cane 2nd to the instability. She is using a walker now.   PERTINENT HISTORY: Right RTC repair; Right meniscal repair;   PAIN:  Are you having pain? Yes: NPRS scale: 2/10 11/15 Pain location: right lateral knee and posterior knee  Pain description: stiffness    Aggravating factors: Standing , walking, activity and bending  Relieving factors: oxycodone   PRECAUTIONS: None  WEIGHT BEARING RESTRICTIONS Yes WBAT   FALLS:  Has patient fallen in last 6 months? No  LIVING ENVIRONMENT: No steps into her house  OCCUPATION: retried   PLOF: Independent with household mobility with device Was using a cane due to knee collapsing   PATIENT GOALS  To have less pain and to start being able to move again   OBJECTIVE:   DIAGNOSTIC FINDINGS:  Nothing post op   PATIENT SURVEYS:  FOTO    COGNITION:  Overall cognitive status: Within functional limits for tasks assessed     SENSATION: Patient reports she does not think the block has worn off yet   EDEMA:  Circumferential: 53.6 48.3   POSTURE: rounded shoulders  PALPATION: No unexpected tenderness to palpation  LOWER EXTREMITY ROM:  Passive ROM Right eval Left eval 10/20 Right  Right  11/15  Hip flexion      Hip extension      Hip abduction      Hip adduction      Hip internal rotation      Hip external rotation      Knee flexion 50 with pain and guarding  65 85  Knee extension -6  -4 -2  Ankle dorsiflexion      Ankle plantarflexion      Ankle inversion      Ankle eversion       (Blank rows = not tested)  LOWER EXTREMITY MMT:  MMT Right eval Left eval Right  Left   Hip flexion 2/5  30.1 34.1  Hip extension      Hip abduction   30.7 37.1  Hip adduction      Hip internal rotation      Hip external rotation      Knee flexion 3/5       Knee extension 2/5  21.6 25.2  Ankle dorsiflexion      Ankle plantarflexion      Ankle inversion      Ankle eversion       (Blank rows = not tested)  FUNCTIONAL TESTS:  Needs hands to stand and gaurding  Needs assist to get right leg onto the table.   GAIT: Decreased single leg stance on the right. Increased lean on the walker and trunk flexion; increased lateral movement away form the hip.    TODAY'S TREATMENT: 11/17 PA and  AP grade III and IV mobilization to the knee to improve flexion and extension;   IASTYM to quad   SLR 3x10 1.5 LAQ 3x10 1.5lb   Heel raise x20  Slow march x20   11/15  patella heat and creep mobilization  PA and AP grade III and IV mobilization to the knee to improve flexion and extension;  mulligan flexion mobilization with strap in probe position;  IASTYM to quad   SLR 3x10 1.5 LAQ 3x10 1.5lb    Step stretch for flexion x10 5 sec hold  Step up 2x10 4 inch   11/10   patella heat and creep mobilization  PA and AP grade III and IV mobilization to the knee to improve flexion and extension;  mulligan flexion mobilization with strap in probe position;  IASTYM to quad   SLR 3x10  LAQ 3x10 1lb   Step stretch for flexion x10 5 sec hold  Step up 2x10 4 inch     11/8 Manual: PROM into flexion and extension; trigger point release and STM to posterior knee and IT band;  ; patella heat and creep mobilization  PA and AP grade III and IV mobilization to the knee to improve flexion and extension;  mulligan flexion mobilization with strap  Quad set 3x10  SLR 3x10  SAQ 3x10   Standing heel/toe x20  Standing slow march 210   Step up 2 inch 2x10  Standing side step 2x10      PATIENT EDUCATION:  Education details: reviewed HEP and symptom management  Person educated: Patient Education method: Explanation, Demonstration, Tactile cues, Verbal cues, and Handouts Education comprehension: verbalized understanding, returned demonstration, verbal cues required, tactile cues required, and needs further education   HOME EXERCISE PROGRAM: Access Code: [email or web address] URL: [email or web address] Date: 11/03/2021 Prepared by: Carolyne Littles  Exercises - Supine Quad Set  - 5 x daily - 7 x weekly - 3 sets - 10 reps - 4-5 sec hold  hold - Supine Heel Slide with Strap  - 1 x daily - 7 x weekly - 3 sets - 5 reps - 10 sec  hold - Seated Ankle Pumps  - 5 x daily - 7 x weekly - 3  sets - 10 reps  ASSESSMENT:  CLINICAL IMPRESSION: Th patient is makin great progress with her functional mobility and ability to ambulate. Her pain is minimal. She has been walking without a cane for short distances and in the house. Her nge remains her biggest obstacle. She has progressed to around round 82-86 degrees. We have given her several options to stretch at home. She has been consistent and persistent with her home flexion stretching. She is very compliant with her stretching program We also continue to work on patella mobility which is limited. She would benefit from further skilled therapy 2W8. We will proceed pending MD recommendations.   OBJECTIVE IMPAIRMENTS Abnormal gait, decreased activity tolerance, decreased balance, decreased endurance, decreased mobility, difficulty walking, decreased ROM, decreased strength, impaired perceived functional ability, and pain.   ACTIVITY LIMITATIONS carrying, lifting, bending, sitting, standing, squatting, stairs, transfers, bed mobility, and locomotion level  PARTICIPATION LIMITATIONS: meal prep, cleaning, laundry, driving, shopping, community activity, and yard work  PERSONAL FACTORS 1-2 comorbidities: right RTC repair; prior right knee surgery   are also affecting patient's functional outcome.   REHAB POTENTIAL: Good significant pain   CLINICAL DECISION MAKING: Evolving/moderate complexity pain and swelling limiting her ability to get in an out of the car and bed   EVALUATION COMPLEXITY: Moderate   GOALS: Goals reviewed with patient? Yes  SHORT TERM GOALS: Target date: 12/02/2021  Patient will increase passive flexion to 75 degrees  Baseline: Goal status: achieved 11/15  2. Patient will increase hip flexor and knee extensor strength to 3+/5  Baseline:  Goal status:Improving 11/14   3.  Patient will demonstrate a 2 cm decrease in mid patella edema on the right  Baseline:  Goal status: per visual inspection significant  improvement in swelling    LONG TERM GOALS: Target date: 12/02/2021   Patient will got up/down 6 steps without pain  Baseline:  Goal status: INITIAL  2.  Patient will bend knee 115 degrees without pain in order to get in and out of the car comfortably  Baseline:  Goal status: INITIAL  3.  Patient will ambulate 2000' with self report of no increase in pain with LRAD Baseline:  Goal status: INITIAL   PLAN: PT FREQUENCY: 2x/week  PT DURATION: 6 weeks  PLANNED INTERVENTIONS: Therapeutic exercises, Therapeutic activity, Neuromuscular re-education, Balance training, Gait training, Patient/Family education, Self Care, Joint mobilization, DME instructions, Aquatic Therapy, Electrical stimulation, Cryotherapy, Moist heat, Taping, Ultrasound, and Manual therapy  PLAN FOR NEXT SESSION: continue to progress exercises as tolerated. Continue with agressive ROM as tolerated.    Carney Living, PT 12/03/2021, 8:11 AM

## 2021-12-05 ENCOUNTER — Encounter (HOSPITAL_BASED_OUTPATIENT_CLINIC_OR_DEPARTMENT_OTHER): Payer: Self-pay | Admitting: Physical Therapy

## 2021-12-06 ENCOUNTER — Ambulatory Visit (HOSPITAL_BASED_OUTPATIENT_CLINIC_OR_DEPARTMENT_OTHER): Payer: Medicare Other | Admitting: Physical Therapy

## 2021-12-06 ENCOUNTER — Encounter (HOSPITAL_BASED_OUTPATIENT_CLINIC_OR_DEPARTMENT_OTHER): Payer: Self-pay | Admitting: Physical Therapy

## 2021-12-06 DIAGNOSIS — M25561 Pain in right knee: Secondary | ICD-10-CM

## 2021-12-06 DIAGNOSIS — R600 Localized edema: Secondary | ICD-10-CM

## 2021-12-06 DIAGNOSIS — R2689 Other abnormalities of gait and mobility: Secondary | ICD-10-CM

## 2021-12-06 DIAGNOSIS — M25661 Stiffness of right knee, not elsewhere classified: Secondary | ICD-10-CM

## 2021-12-06 NOTE — Therapy (Signed)
OUTPATIENT PHYSICAL THERAPY LOWER EXTREMITY Treatment/ Progress    Patient Name: Alicia [name] MRN: [id number] DOB:Jun 07, 1955, 66 y.o., female Today's Date: 12/06/2021   Reporting Period 11/02/2021 to 11/172023  See note below for Objective Data and Assessment of Progress/Goals.       PT End of Session - 12/06/21 0802     Visit Number 11    Number of Visits 26    Date for PT Re-Evaluation 01/30/22    PT Start Time 0800    PT Stop Time 0843    PT Time Calculation (min) 43 min    Activity Tolerance Patient tolerated treatment well    Behavior During Therapy [facility] for tasks assessed/performed             Past Medical History:  Diagnosis Date   Allergy    Cataract    removed bilateral -2014   Chronic tension headaches    Hx of varicella    Hypertension    Kidney stones    Near syncope    Nephrolithiasis    history of  passed on own.    Palpitations    Thyroid disease    pt. denies 06/11/19   Tubular adenoma of colon 02/1997   w/ HGD   Varicose veins    Past Surgical History:  Procedure Laterality Date   ABDOMINAL HYSTERECTOMY     fibroids non cancer    CATARACT EXTRACTION     Bilateral   cataract surgery Right 03/02/2012   CHOLECYSTECTOMY     KNEE SURGERY  04/2020   KNEE SURGERY     ROTATOR CUFF REPAIR Right    TONSILLECTOMY     Patient Active Problem List   Diagnosis Date Noted   Hypokalemia 01/25/2017   Thyroid disease    Palpitations    Nephrolithiasis    Near syncope    Kidney stones    Hypertension    Hx of varicella    Chronic tension headaches    Allergy    Nocturnal headaches 05/04/2016   Coccygodynia 01/29/2015   Atypical chest pain 09/09/2014   Essential hypertension 09/09/2014   Midline low back pain without sciatica 09/05/2013   Renal cyst 09/05/2013   Fatty infiltration of liver 58/52/7782   Umbilical hernia without obstruction and without gangrene 09/05/2013   Flank pain 07/29/2013   Encounter to establish care with new  doctor 07/11/2013   Left leg pain 07/11/2013   Cough 01/22/2013   Swelling in head/neck 10/31/2012   Routine health maintenance 04/05/2012   Unspecified vitamin D deficiency 04/04/2012   Varicose veins of lower extremities with other complications 42/35/3614   Chronic venous insufficiency 09/02/2011   Allergic rhinitis, cause unspecified 06/08/2011   Paresthesia of left arm 06/08/2011   Essential hypertension, benign 03/26/2010   OBESITY, CLASS III 10/02/2009   Other and unspecified coagulation defects 04/03/2009   PERIPHERAL EDEMA 10/22/2008   HEMORRHOIDS, INTERNAL, WITH BLEEDING 05/07/2008   NEPHROLITHIASIS, HX OF 04/28/2008   SYNCOPE 04/19/2007   ANXIETY DEPRESSION 10/31/2006   HYSTERECTOMY, VAGINAL, HX OF 10/31/2006    PCP: Dr Shanon Ace   REFERRING PROVIDER: Laqueta Jean PA-C  REFERRING DIAG:  [phone] (ICD-10-CM) - Idiopathic aseptic necrosis of right femur  Right UKA   THERAPY DIAG:  Acute pain of right knee  Stiffness of right knee, not elsewhere classified  Localized edema  Other abnormalities of gait and mobility  Rationale for Evaluation and Treatment Rehabilitation  ONSET DATE: 10/26/2021  Days since surgery: 10/26/2021  SUBJECTIVE:  SUBJECTIVE STATEMENT: The patient did a lot of walking yesterday and had some twitching in her knee. She reports it loosens up then tightens.   Eval  Patient had a unicompartmental knee replacement on 10/10. At this time she is limited by swelling. She has difficulty getting in and out of the car.  She had necrosis in her knee prior to surgery. She used a cane 2nd to the instability. She is using a walker now.   PERTINENT HISTORY: Right RTC repair; Right meniscal repair;   PAIN:  Are you having pain? Yes: NPRS scale: 2/10 11/15 Pain location: right lateral knee and posterior knee  Pain description: stiffness   Aggravating factors: Standing , walking, activity and bending  Relieving factors: oxycodone    PRECAUTIONS: None  WEIGHT BEARING RESTRICTIONS Yes WBAT   FALLS:  Has patient fallen in last 6 months? No  LIVING ENVIRONMENT: No steps into her house  OCCUPATION: retried   PLOF: Independent with household mobility with device Was using a cane due to knee collapsing   PATIENT GOALS  To have less pain and to start being able to move again   OBJECTIVE:   DIAGNOSTIC FINDINGS:  Nothing post op   PATIENT SURVEYS:  FOTO    COGNITION:  Overall cognitive status: Within functional limits for tasks assessed     SENSATION: Patient reports she does not think the block has worn off yet   EDEMA:  Circumferential: 53.6 48.3   POSTURE: rounded shoulders  PALPATION: No unexpected tenderness to palpation  LOWER EXTREMITY ROM:  Passive ROM Right eval Left eval 10/20 Right  Right  11/15  Hip flexion      Hip extension      Hip abduction      Hip adduction      Hip internal rotation      Hip external rotation      Knee flexion 50 with pain and guarding  65 85  Knee extension -6  -4 -2  Ankle dorsiflexion      Ankle plantarflexion      Ankle inversion      Ankle eversion       (Blank rows = not tested)  LOWER EXTREMITY MMT:  MMT Right eval Left eval Right  Left   Hip flexion 2/5  30.1 34.1  Hip extension      Hip abduction   30.7 37.1  Hip adduction      Hip internal rotation      Hip external rotation      Knee flexion 3/5       Knee extension 2/5  21.6 25.2  Ankle dorsiflexion      Ankle plantarflexion      Ankle inversion      Ankle eversion       (Blank rows = not tested)  FUNCTIONAL TESTS:  Needs hands to stand and gaurding  Needs assist to get right leg onto the table.   GAIT: Decreased single leg stance on the right. Increased lean on the walker and trunk flexion; increased lateral movement away form the hip.    TODAY'S TREATMENT: 11/20 PA and AP grade III and IV mobilization to the knee to improve flexion and extension;   IASTYM to  quad    SLR 3x10 1.5 LAQ 3x10 1.5lb   Step up 2x10 4 inch     11/17 PA and AP grade III and IV mobilization to the knee to improve flexion and extension;   IASTYM to quad  SLR 3x10 1.5 LAQ 3x10 1.5lb   Heel raise x20  Slow march x20   11/15  patella heat and creep mobilization  PA and AP grade III and IV mobilization to the knee to improve flexion and extension;  mulligan flexion mobilization with strap in probe position;  IASTYM to quad   SLR 3x10 1.5 LAQ 3x10 1.5lb    Step stretch for flexion x10 5 sec hold  Step up 2x10 4 inch   11/10   patella heat and creep mobilization  PA and AP grade III and IV mobilization to the knee to improve flexion and extension;  mulligan flexion mobilization with strap in probe position;  IASTYM to quad   SLR 3x10  LAQ 3x10 1lb   Step stretch for flexion x10 5 sec hold  Step up 2x10 4 inch     11/8 Manual: PROM into flexion and extension; trigger point release and STM to posterior knee and IT band;  ; patella heat and creep mobilization  PA and AP grade III and IV mobilization to the knee to improve flexion and extension;  mulligan flexion mobilization with strap  Quad set 3x10  SLR 3x10  SAQ 3x10   Standing heel/toe x20  Standing slow march 210   Step up 2 inch 2x10  Standing side step 2x10      PATIENT EDUCATION:  Education details: reviewed HEP and symptom management  Person educated: Patient Education method: Explanation, Demonstration, Tactile cues, Verbal cues, and Handouts Education comprehension: verbalized understanding, returned demonstration, verbal cues required, tactile cues required, and needs further education   HOME EXERCISE PROGRAM: Access Code: [email or web address] URL: [email or web address] Date: 11/03/2021 Prepared by: Carolyne Littles  Exercises - Supine Quad Set  - 5 x daily - 7 x weekly - 3 sets - 10 reps - 4-5 sec hold  hold - Supine Heel Slide with Strap  - 1 x daily - 7 x weekly -  3 sets - 5 reps - 10 sec  hold - Seated Ankle Pumps  - 5 x daily - 7 x weekly - 3 sets - 10 reps  ASSESSMENT:  CLINICAL IMPRESSION: The patients ROM was the best that it has been. She was more relaxed today. She tolerated there-ex well. She is walking further and doing more without much pain. She has some spasming. She was advised to tell her MD but it is likely just because she is doing more. We will proceed per MD recommendations.  OBJECTIVE IMPAIRMENTS Abnormal gait, decreased activity tolerance, decreased balance, decreased endurance, decreased mobility, difficulty walking, decreased ROM, decreased strength, impaired perceived functional ability, and pain.   ACTIVITY LIMITATIONS carrying, lifting, bending, sitting, standing, squatting, stairs, transfers, bed mobility, and locomotion level  PARTICIPATION LIMITATIONS: meal prep, cleaning, laundry, driving, shopping, community activity, and yard work  PERSONAL FACTORS 1-2 comorbidities: right RTC repair; prior right knee surgery   are also affecting patient's functional outcome.   REHAB POTENTIAL: Good significant pain   CLINICAL DECISION MAKING: Evolving/moderate complexity pain and swelling limiting her ability to get in an out of the car and bed   EVALUATION COMPLEXITY: Moderate   GOALS: Goals reviewed with patient? Yes  SHORT TERM GOALS: Target date: 12/02/2021  Patient will increase passive flexion to 75 degrees  Baseline: Goal status: achieved 11/15  2. Patient will increase hip flexor and knee extensor strength to 3+/5  Baseline:  Goal status:Improving 11/14   3.  Patient will demonstrate a 2 cm decrease in mid patella edema on the  right  Baseline:  Goal status: per visual inspection significant improvement in swelling    LONG TERM GOALS: Target date: 12/02/2021   Patient will got up/down 6 steps without pain  Baseline:  Goal status: INITIAL  2.  Patient will bend knee 115 degrees without pain in order to get in  and out of the car comfortably  Baseline:  Goal status: INITIAL  3.  Patient will ambulate 2000' with self report of no increase in pain with LRAD Baseline:  Goal status: INITIAL   PLAN: PT FREQUENCY: 2x/week  PT DURATION: 6 weeks  PLANNED INTERVENTIONS: Therapeutic exercises, Therapeutic activity, Neuromuscular re-education, Balance training, Gait training, Patient/Family education, Self Care, Joint mobilization, DME instructions, Aquatic Therapy, Electrical stimulation, Cryotherapy, Moist heat, Taping, Ultrasound, and Manual therapy  PLAN FOR NEXT SESSION: continue to progress exercises as tolerated. Continue with agressive ROM as tolerated.    Carney Living, PT 12/06/2021, 8:33 AM

## 2021-12-08 ENCOUNTER — Ambulatory Visit (HOSPITAL_BASED_OUTPATIENT_CLINIC_OR_DEPARTMENT_OTHER): Payer: Medicare Other | Admitting: Physical Therapy

## 2021-12-08 ENCOUNTER — Encounter (HOSPITAL_BASED_OUTPATIENT_CLINIC_OR_DEPARTMENT_OTHER): Payer: Self-pay | Admitting: Physical Therapy

## 2021-12-08 DIAGNOSIS — R600 Localized edema: Secondary | ICD-10-CM

## 2021-12-08 DIAGNOSIS — M25661 Stiffness of right knee, not elsewhere classified: Secondary | ICD-10-CM

## 2021-12-08 DIAGNOSIS — R2689 Other abnormalities of gait and mobility: Secondary | ICD-10-CM

## 2021-12-08 DIAGNOSIS — M25561 Pain in right knee: Secondary | ICD-10-CM | POA: Diagnosis not present

## 2021-12-08 NOTE — Therapy (Signed)
OUTPATIENT PHYSICAL THERAPY LOWER EXTREMITY Treatment/ Progress    Patient Name: Alicia [name] MRN: [id number] DOB:09-Nov-1955, 66 y.o., female Today's Date: 12/08/2021   Reporting Period 11/02/2021 to 11/172023  See note below for Objective Data and Assessment of Progress/Goals.       PT End of Session - 12/08/21 0848     Visit Number 12    Number of Visits 26    Date for PT Re-Evaluation 01/30/22    PT Start Time 0800    PT Stop Time 0842    PT Time Calculation (min) 42 min    Activity Tolerance Patient tolerated treatment well    Behavior During Therapy [facility] for tasks assessed/performed             Past Medical History:  Diagnosis Date   Allergy    Cataract    removed bilateral -2014   Chronic tension headaches    Hx of varicella    Hypertension    Kidney stones    Near syncope    Nephrolithiasis    history of  passed on own.    Palpitations    Thyroid disease    pt. denies 06/11/19   Tubular adenoma of colon 02/1997   w/ HGD   Varicose veins    Past Surgical History:  Procedure Laterality Date   ABDOMINAL HYSTERECTOMY     fibroids non cancer    CATARACT EXTRACTION     Bilateral   cataract surgery Right 03/02/2012   CHOLECYSTECTOMY     KNEE SURGERY  04/2020   KNEE SURGERY     ROTATOR CUFF REPAIR Right    TONSILLECTOMY     Patient Active Problem List   Diagnosis Date Noted   Hypokalemia 01/25/2017   Thyroid disease    Palpitations    Nephrolithiasis    Near syncope    Kidney stones    Hypertension    Hx of varicella    Chronic tension headaches    Allergy    Nocturnal headaches 05/04/2016   Coccygodynia 01/29/2015   Atypical chest pain 09/09/2014   Essential hypertension 09/09/2014   Midline low back pain without sciatica 09/05/2013   Renal cyst 09/05/2013   Fatty infiltration of liver 95/28/4132   Umbilical hernia without obstruction and without gangrene 09/05/2013   Flank pain 07/29/2013   Encounter to establish care with new  doctor 07/11/2013   Left leg pain 07/11/2013   Cough 01/22/2013   Swelling in head/neck 10/31/2012   Routine health maintenance 04/05/2012   Unspecified vitamin D deficiency 04/04/2012   Varicose veins of lower extremities with other complications 44/01/270   Chronic venous insufficiency 09/02/2011   Allergic rhinitis, cause unspecified 06/08/2011   Paresthesia of left arm 06/08/2011   Essential hypertension, benign 03/26/2010   OBESITY, CLASS III 10/02/2009   Other and unspecified coagulation defects 04/03/2009   PERIPHERAL EDEMA 10/22/2008   HEMORRHOIDS, INTERNAL, WITH BLEEDING 05/07/2008   NEPHROLITHIASIS, HX OF 04/28/2008   SYNCOPE 04/19/2007   ANXIETY DEPRESSION 10/31/2006   HYSTERECTOMY, VAGINAL, HX OF 10/31/2006    PCP: Dr Shanon Ace   REFERRING PROVIDER: Laqueta Jean PA-C  REFERRING DIAG:  [phone] (ICD-10-CM) - Idiopathic aseptic necrosis of right femur  Right UKA   THERAPY DIAG:  Acute pain of right knee  Stiffness of right knee, not elsewhere classified  Localized edema  Other abnormalities of gait and mobility  Rationale for Evaluation and Treatment Rehabilitation  ONSET DATE: 10/26/2021  Days since surgery: 10/26/2021  SUBJECTIVE:  SUBJECTIVE STATEMENT: The patient has been back to the MD for follow-up.  MD wants the patient to work aggressively on her range of motion for the next 2 weeks.  If we do not reach 90 degrees he plans to proceed with a MUA.  She reports her knee is feeling good today no major complaints Eval  Patient had a unicompartmental knee replacement on . At this time she is limited by swelling. She has difficulty getting in and out of the car.  She had necrosis in her knee prior to surgery. She used a cane 2nd to the instability. She is using a walker now.   PERTINENT HISTORY: Right RTC repair; Right meniscal repair;   PAIN:  Are you having pain? Yes: NPRS scale: 0/10 11/15 Pain location: right lateral knee and posterior knee   Pain description: stiffness   Aggravating factors: Standing , walking, activity and bending  Relieving factors: oxycodone   PRECAUTIONS: None  WEIGHT BEARING RESTRICTIONS Yes WBAT   FALLS:  Has patient fallen in last 6 months? No  LIVING ENVIRONMENT: No steps into her house  OCCUPATION: retried   PLOF: Independent with household mobility with device Was using a cane due to knee collapsing   PATIENT GOALS  To have less pain and to start being able to move again   OBJECTIVE:   DIAGNOSTIC FINDINGS:  Nothing post op   PATIENT SURVEYS:  FOTO    COGNITION:  Overall cognitive status: Within functional limits for tasks assessed     SENSATION: Patient reports she does not think the block has worn off yet   EDEMA:  Circumferential: 53.6 48.3   POSTURE: rounded shoulders  PALPATION: No unexpected tenderness to palpation  LOWER EXTREMITY ROM:  Passive ROM Right eval Left eval 10/20 Right  Right  11/15  Hip flexion      Hip extension      Hip abduction      Hip adduction      Hip internal rotation      Hip external rotation      Knee flexion 50 with pain and guarding  65 85  Knee extension -6  -4 -2  Ankle dorsiflexion      Ankle plantarflexion      Ankle inversion      Ankle eversion       (Blank rows = not tested)  LOWER EXTREMITY MMT:  MMT Right eval Left eval Right  Left   Hip flexion 2/5  30.1 34.1  Hip extension      Hip abduction   30.7 37.1  Hip adduction      Hip internal rotation      Hip external rotation      Knee flexion 3/5       Knee extension 2/5  21.6 25.2  Ankle dorsiflexion      Ankle plantarflexion      Ankle inversion      Ankle eversion       (Blank rows = not tested)  FUNCTIONAL TESTS:  Needs hands to stand and gaurding  Needs assist to get right leg onto the table.   GAIT: Decreased single leg stance on the right. Increased lean on the walker and trunk flexion; increased lateral movement away form the hip.     TODAY'S TREATMENT: 11/22 Manual: PROM into flexion and extension; trigger point release and STM to posterior knee and IT band;  ; patella heat and creep mobilization  PA and AP grade III and IV  mobilization to the knee to improve flexion and extension;  mulligan flexion mobilization with strap    11/20 PA and AP grade III and IV mobilization to the knee to improve flexion and extension;   IASTYM to quad    SLR 3x10 1.5 LAQ 3x10 1.5lb   Step up 2x10 4 inch     11/17 PA and AP grade III and IV mobilization to the knee to improve flexion and extension;   IASTYM to quad   SLR 3x10 1.5 LAQ 3x10 1.5lb   Heel raise x20  Slow march x20   11/15  patella heat and creep mobilization  PA and AP grade III and IV mobilization to the knee to improve flexion and extension;  mulligan flexion mobilization with strap in probe position;  IASTYM to quad   SLR 3x10 1.5 LAQ 3x10 1.5lb    Step stretch for flexion x10 5 sec hold  Step up 2x10 4 inch   11/10   patella heat and creep mobilization  PA and AP grade III and IV mobilization to the knee to improve flexion and extension;  mulligan flexion mobilization with strap in probe position;  IASTYM to quad   SLR 3x10  LAQ 3x10 1lb   Step stretch for flexion x10 5 sec hold  Step up 2x10 4 inch     11/8 Manual: PROM into flexion and extension; trigger point release and STM to posterior knee and IT band;  ; patella heat and creep mobilization  PA and AP grade III and IV mobilization to the knee to improve flexion and extension;  mulligan flexion mobilization with strap  Quad set 3x10  SLR 3x10  SAQ 3x10   Standing heel/toe x20  Standing slow march 210   Step up 2 inch 2x10  Standing side step 2x10      PATIENT EDUCATION:  Education details: reviewed HEP and symptom management  Person educated: Patient Education method: Explanation, Demonstration, Tactile cues, Verbal cues, and Handouts Education comprehension:  verbalized understanding, returned demonstration, verbal cues required, tactile cues required, and needs further education   HOME EXERCISE PROGRAM: Access Code: [email or web address] URL: [email or web address] Date: 11/03/2021 Prepared by: Carolyne Littles  Exercises - Supine Quad Set  - 5 x daily - 7 x weekly - 3 sets - 10 reps - 4-5 sec hold  hold - Supine Heel Slide with Strap  - 1 x daily - 7 x weekly - 3 sets - 5 reps - 10 sec  hold - Seated Ankle Pumps  - 5 x daily - 7 x weekly - 3 sets - 10 reps  ASSESSMENT:  CLINICAL IMPRESSION: Per MD recommendation we will focus specifically on manual knee flexion strengthening over the next 2 weeks.  She continues to make slow but steady improvements.  She did not reach 90 degrees today, but she has less guarding at end range.  Therapy performed Mulligan strap mobilization with heat.  We also worked on aggressive patellar mobilization with improved medial lateral movement following mobilization.  She is advised to continue stretching aggressively at home. OBJECTIVE IMPAIRMENTS Abnormal gait, decreased activity tolerance, decreased balance, decreased endurance, decreased mobility, difficulty walking, decreased ROM, decreased strength, impaired perceived functional ability, and pain.   ACTIVITY LIMITATIONS carrying, lifting, bending, sitting, standing, squatting, stairs, transfers, bed mobility, and locomotion level  PARTICIPATION LIMITATIONS: meal prep, cleaning, laundry, driving, shopping, community activity, and yard work  PERSONAL FACTORS 1-2 comorbidities: right RTC repair; prior right knee surgery   are also affecting patient's functional  outcome.   REHAB POTENTIAL: Good significant pain   CLINICAL DECISION MAKING: Evolving/moderate complexity pain and swelling limiting her ability to get in an out of the car and bed   EVALUATION COMPLEXITY: Moderate   GOALS: Goals reviewed with patient? Yes  SHORT TERM GOALS: Target date: 12/02/2021   Patient will increase passive flexion to 75 degrees  Baseline: Goal status: achieved 11/15  2. Patient will increase hip flexor and knee extensor strength to 3+/5  Baseline:  Goal status:Improving 11/14   3.  Patient will demonstrate a 2 cm decrease in mid patella edema on the right  Baseline:  Goal status: per visual inspection significant improvement in swelling    LONG TERM GOALS: Target date: 12/02/2021   Patient will got up/down 6 steps without pain  Baseline:  Goal status: INITIAL  2.  Patient will bend knee 115 degrees without pain in order to get in and out of the car comfortably  Baseline:  Goal status: INITIAL  3.  Patient will ambulate 2000' with self report of no increase in pain with LRAD Baseline:  Goal status: INITIAL   PLAN: PT FREQUENCY: 2x/week  PT DURATION: 6 weeks  PLANNED INTERVENTIONS: Therapeutic exercises, Therapeutic activity, Neuromuscular re-education, Balance training, Gait training, Patient/Family education, Self Care, Joint mobilization, DME instructions, Aquatic Therapy, Electrical stimulation, Cryotherapy, Moist heat, Taping, Ultrasound, and Manual therapy  PLAN FOR NEXT SESSION: continue to progress exercises as tolerated. Continue with agressive ROM as tolerated.    Carney Living, PT 12/08/2021, 10:06 AM

## 2021-12-13 ENCOUNTER — Encounter (HOSPITAL_BASED_OUTPATIENT_CLINIC_OR_DEPARTMENT_OTHER): Payer: Self-pay | Admitting: Physical Therapy

## 2021-12-13 ENCOUNTER — Ambulatory Visit (HOSPITAL_BASED_OUTPATIENT_CLINIC_OR_DEPARTMENT_OTHER): Payer: Medicare Other | Admitting: Physical Therapy

## 2021-12-13 DIAGNOSIS — M25661 Stiffness of right knee, not elsewhere classified: Secondary | ICD-10-CM

## 2021-12-13 DIAGNOSIS — R600 Localized edema: Secondary | ICD-10-CM

## 2021-12-13 DIAGNOSIS — M25561 Pain in right knee: Secondary | ICD-10-CM

## 2021-12-13 DIAGNOSIS — R2689 Other abnormalities of gait and mobility: Secondary | ICD-10-CM

## 2021-12-13 NOTE — Therapy (Signed)
OUTPATIENT PHYSICAL THERAPY LOWER EXTREMITY Treatment/ Progress    Patient Name: Alicia [name] MRN: [id number] DOB:[birth year], 66 y.o., female Today's Date: 12/13/2021   Reporting Period 11/02/2021 to 11/172023  See note below for Objective Data and Assessment of Progress/Goals.         Past Medical History:  Diagnosis Date   Allergy    Cataract    removed bilateral -2014   Chronic tension headaches    Hx of varicella    Hypertension    Kidney stones    Near syncope    Nephrolithiasis    history of  passed on own.    Palpitations    Thyroid disease    pt. denies 06/11/19   Tubular adenoma of colon 02/1997   w/ HGD   Varicose veins    Past Surgical History:  Procedure Laterality Date   ABDOMINAL HYSTERECTOMY     fibroids non cancer    CATARACT EXTRACTION     Bilateral   cataract surgery Right 03/02/2012   CHOLECYSTECTOMY     KNEE SURGERY  04/2020   KNEE SURGERY     ROTATOR CUFF REPAIR Right    TONSILLECTOMY     Patient Active Problem List   Diagnosis Date Noted   Hypokalemia 01/25/2017   Thyroid disease    Palpitations    Nephrolithiasis    Near syncope    Kidney stones    Hypertension    Hx of varicella    Chronic tension headaches    Allergy    Nocturnal headaches 05/04/2016   Coccygodynia 01/29/2015   Atypical chest pain 09/09/2014   Essential hypertension 09/09/2014   Midline low back pain without sciatica 09/05/2013   Renal cyst 09/05/2013   Fatty infiltration of liver 59/56/3875   Umbilical hernia without obstruction and without gangrene 09/05/2013   Flank pain 07/29/2013   Encounter to establish care with new doctor 07/11/2013   Left leg pain 07/11/2013   Cough 01/22/2013   Swelling in head/neck 10/31/2012   Routine health maintenance 04/05/2012   Unspecified vitamin D deficiency 04/04/2012   Varicose veins of lower extremities with other complications 64/33/2951   Chronic venous insufficiency 09/02/2011   Allergic rhinitis,  cause unspecified 06/08/2011   Paresthesia of left arm 06/08/2011   Essential hypertension, benign 03/26/2010   OBESITY, CLASS III 10/02/2009   Other and unspecified coagulation defects 04/03/2009   PERIPHERAL EDEMA 10/22/2008   HEMORRHOIDS, INTERNAL, WITH BLEEDING 05/07/2008   NEPHROLITHIASIS, HX OF 04/28/2008   SYNCOPE 04/19/2007   ANXIETY DEPRESSION 10/31/2006   HYSTERECTOMY, VAGINAL, HX OF 10/31/2006    PCP: Dr Shanon Ace   REFERRING PROVIDER: Laqueta Jean PA-C  REFERRING DIAG:  M87.051 (ICD-10-CM) - Idiopathic aseptic necrosis of right femur  Right UKA   THERAPY DIAG:  No diagnosis found.  Rationale for Evaluation and Treatment Rehabilitation  ONSET DATE: 10/26/2021  Days since surgery: 10/26/2021  SUBJECTIVE:   SUBJECTIVE STATEMENT: The patient reported some popping and cracking over the past few days. She feels like it is a bit stiff.    Patient had a unicompartmental knee replacement on . At this time she is limited by swelling. She has difficulty getting in and out of the car.  She had necrosis in her knee prior to surgery. She used a cane 2nd to the instability. She is using a walker now.   PERTINENT HISTORY: Right RTC repair; Right meniscal repair;   PAIN:  Are you having pain? Yes: NPRS scale: 0/10 11/15 Pain location:  right lateral knee and posterior knee  Pain description: stiffness   Aggravating factors: Standing , walking, activity and bending  Relieving factors: oxycodone   PRECAUTIONS: None  WEIGHT BEARING RESTRICTIONS Yes WBAT   FALLS:  Has patient fallen in last 6 months? No  LIVING ENVIRONMENT: No steps into her house  OCCUPATION: retried   PLOF: Independent with household mobility with device Was using a cane due to knee collapsing   PATIENT GOALS  To have less pain and to start being able to move again   OBJECTIVE:   DIAGNOSTIC FINDINGS:  Nothing post op   PATIENT SURVEYS:  FOTO    COGNITION:  Overall cognitive status:  Within functional limits for tasks assessed     SENSATION: Patient reports she does not think the block has worn off yet   EDEMA:  Circumferential: 53.6 48.3   POSTURE: rounded shoulders  PALPATION: No unexpected tenderness to palpation  LOWER EXTREMITY ROM:  Passive ROM Right eval Left eval 10/20 Right  Right  11/15  Hip flexion      Hip extension      Hip abduction      Hip adduction      Hip internal rotation      Hip external rotation      Knee flexion 50 with pain and guarding  65 85  Knee extension -6  -4 -2  Ankle dorsiflexion      Ankle plantarflexion      Ankle inversion      Ankle eversion       (Blank rows = not tested)  LOWER EXTREMITY MMT:  MMT Right eval Left eval Right  Left   Hip flexion 2/5  30.1 34.1  Hip extension      Hip abduction   30.7 37.1  Hip adduction      Hip internal rotation      Hip external rotation      Knee flexion 3/5       Knee extension 2/5  21.6 25.2  Ankle dorsiflexion      Ankle plantarflexion      Ankle inversion      Ankle eversion       (Blank rows = not tested)  FUNCTIONAL TESTS:  Needs hands to stand and gaurding  Needs assist to get right leg onto the table.   GAIT: Decreased single leg stance on the right. Increased lean on the walker and trunk flexion; increased lateral movement away form the hip.    TODAY'S TREATMENT: 11/27 Manual: PROM into flexion and extension; trigger point release and STM to posterior knee and IT band;  ; patella heat and creep mobilization  PA and AP grade III and IV mobilization to the knee to improve flexion and extension;  mulligan flexion mobilization with strap    11/22 Manual: PROM into flexion and extension; trigger point release and STM to posterior knee and IT band;  ; patella heat and creep mobilization  PA and AP grade III and IV mobilization to the knee to improve flexion and extension;  mulligan flexion mobilization with strap    11/20 PA and AP grade III  and IV mobilization to the knee to improve flexion and extension;   IASTYM to quad    SLR 3x10 1.5 LAQ 3x10 1.5lb   Step up 2x10 4 inch     11/17 PA and AP grade III and IV mobilization to the knee to improve flexion and extension;   IASTYM to quad  SLR 3x10 1.5 LAQ 3x10 1.5lb   Heel raise x20  Slow march x20   11/15  patella heat and creep mobilization  PA and AP grade III and IV mobilization to the knee to improve flexion and extension;  mulligan flexion mobilization with strap in probe position;  IASTYM to quad   SLR 3x10 1.5 LAQ 3x10 1.5lb    Step stretch for flexion x10 5 sec hold  Step up 2x10 4 inch   11/10   patella heat and creep mobilization  PA and AP grade III and IV mobilization to the knee to improve flexion and extension;  mulligan flexion mobilization with strap in probe position;  IASTYM to quad   SLR 3x10  LAQ 3x10 1lb   Step stretch for flexion x10 5 sec hold  Step up 2x10 4 inch    PATIENT EDUCATION:  Education details: reviewed HEP and symptom management  Person educated: Patient Education method: Explanation, Demonstration, Tactile cues, Verbal cues, and Handouts Education comprehension: verbalized understanding, returned demonstration, verbal cues required, tactile cues required, and needs further education   HOME EXERCISE PROGRAM: Access Code: [email or web address] URL: [email or web address] Date: 11/03/2021 Prepared by: Carolyne Littles  Exercises - Supine Quad Set  - 5 x daily - 7 x weekly - 3 sets - 10 reps - 4-5 sec hold  hold - Supine Heel Slide with Strap  - 1 x daily - 7 x weekly - 3 sets - 5 reps - 10 sec  hold - Seated Ankle Pumps  - 5 x daily - 7 x weekly - 3 sets - 10 reps  ASSESSMENT:  CLINICAL IMPRESSION: The patients motion has improved significantly. Her flexion was measured at 91 degrees today. She was advised to continue to be aggressive with it. We will try to get her an appointment at the end of the week. She  will be out of town Wednesday and Thursday. We continue to work on motion. She was working on steps on her own prior to the appointment.    OBJECTIVE IMPAIRMENTS Abnormal gait, decreased activity tolerance, decreased balance, decreased endurance, decreased mobility, difficulty walking, decreased ROM, decreased strength, impaired perceived functional ability, and pain.   ACTIVITY LIMITATIONS carrying, lifting, bending, sitting, standing, squatting, stairs, transfers, bed mobility, and locomotion level  PARTICIPATION LIMITATIONS: meal prep, cleaning, laundry, driving, shopping, community activity, and yard work  PERSONAL FACTORS 1-2 comorbidities: right RTC repair; prior right knee surgery   are also affecting patient's functional outcome.   REHAB POTENTIAL: Good significant pain   CLINICAL DECISION MAKING: Evolving/moderate complexity pain and swelling limiting her ability to get in an out of the car and bed   EVALUATION COMPLEXITY: Moderate   GOALS: Goals reviewed with patient? Yes  SHORT TERM GOALS: Target date: 12/02/2021  Patient will increase passive flexion to 75 degrees  Baseline: Goal status: achieved 11/15  2. Patient will increase hip flexor and knee extensor strength to 3+/5  Baseline:  Goal status:Improving 11/14   3.  Patient will demonstrate a 2 cm decrease in mid patella edema on the right  Baseline:  Goal status: per visual inspection significant improvement in swelling    LONG TERM GOALS: Target date: 12/02/2021   Patient will got up/down 6 steps without pain  Baseline:  Goal status: INITIAL  2.  Patient will bend knee 115 degrees without pain in order to get in and out of the car comfortably  Baseline:  Goal status: INITIAL  3.  Patient will ambulate 2000' with self  report of no increase in pain with LRAD Baseline:  Goal status: INITIAL   PLAN: PT FREQUENCY: 2x/week  PT DURATION: 6 weeks  PLANNED INTERVENTIONS: Therapeutic exercises, Therapeutic  activity, Neuromuscular re-education, Balance training, Gait training, Patient/Family education, Self Care, Joint mobilization, DME instructions, Aquatic Therapy, Electrical stimulation, Cryotherapy, Moist heat, Taping, Ultrasound, and Manual therapy  PLAN FOR NEXT SESSION: continue to progress exercises as tolerated. Continue with agressive ROM as tolerated.    Carney Living, PT 12/13/2021, 8:01 AM

## 2021-12-15 ENCOUNTER — Encounter (HOSPITAL_BASED_OUTPATIENT_CLINIC_OR_DEPARTMENT_OTHER): Payer: Medicare Other | Admitting: Physical Therapy

## 2021-12-17 ENCOUNTER — Ambulatory Visit (HOSPITAL_BASED_OUTPATIENT_CLINIC_OR_DEPARTMENT_OTHER): Payer: Medicare Other | Attending: Rehabilitation | Admitting: Physical Therapy

## 2021-12-17 DIAGNOSIS — M25661 Stiffness of right knee, not elsewhere classified: Secondary | ICD-10-CM | POA: Insufficient documentation

## 2021-12-17 DIAGNOSIS — M25561 Pain in right knee: Secondary | ICD-10-CM | POA: Diagnosis not present

## 2021-12-17 DIAGNOSIS — R2689 Other abnormalities of gait and mobility: Secondary | ICD-10-CM | POA: Insufficient documentation

## 2021-12-17 DIAGNOSIS — R600 Localized edema: Secondary | ICD-10-CM | POA: Insufficient documentation

## 2021-12-17 NOTE — Therapy (Signed)
OUTPATIENT PHYSICAL THERAPY LOWER EXTREMITY Treatment/ Progress    Patient Name: Alicia [name] MRN: [id number] DOB:March 29, 1955, 66 y.o., female Today's Date: 12/20/2021   Reporting Period 11/02/2021 to 11/172023  See note below for Objective Data and Assessment of Progress/Goals.         Past Medical History:  Diagnosis Date   Allergy    Cataract    removed bilateral -2014   Chronic tension headaches    Hx of varicella    Hypertension    Kidney stones    Near syncope    Nephrolithiasis    history of  passed on own.    Palpitations    Thyroid disease    pt. denies 06/11/19   Tubular adenoma of colon 02/1997   w/ HGD   Varicose veins    Past Surgical History:  Procedure Laterality Date   ABDOMINAL HYSTERECTOMY     fibroids non cancer    CATARACT EXTRACTION     Bilateral   cataract surgery Right 03/02/2012   CHOLECYSTECTOMY     KNEE SURGERY  04/2020   KNEE SURGERY     ROTATOR CUFF REPAIR Right    TONSILLECTOMY     Patient Active Problem List   Diagnosis Date Noted   Hypokalemia 01/25/2017   Thyroid disease    Palpitations    Nephrolithiasis    Near syncope    Kidney stones    Hypertension    Hx of varicella    Chronic tension headaches    Allergy    Nocturnal headaches 05/04/2016   Coccygodynia 01/29/2015   Atypical chest pain 09/09/2014   Essential hypertension 09/09/2014   Midline low back pain without sciatica 09/05/2013   Renal cyst 09/05/2013   Fatty infiltration of liver 09/05/2013   Umbilical hernia without obstruction and without gangrene 09/05/2013   Flank pain 07/29/2013   Encounter to establish care with new doctor 07/11/2013   Left leg pain 07/11/2013   Cough 01/22/2013   Swelling in head/neck 10/31/2012   Routine health maintenance 04/05/2012   Unspecified vitamin D deficiency 04/04/2012   Varicose veins of lower extremities with other complications 09/02/2011   Chronic venous insufficiency 09/02/2011   Allergic rhinitis,  cause unspecified 06/08/2011   Paresthesia of left arm 06/08/2011   Essential hypertension, benign 03/26/2010   OBESITY, CLASS III 10/02/2009   Other and unspecified coagulation defects 04/03/2009   PERIPHERAL EDEMA 10/22/2008   HEMORRHOIDS, INTERNAL, WITH BLEEDING 05/07/2008   NEPHROLITHIASIS, HX OF 04/28/2008   SYNCOPE 04/19/2007   ANXIETY DEPRESSION 10/31/2006   HYSTERECTOMY, VAGINAL, HX OF 10/31/2006    PCP: Dr Berniece Andreas   REFERRING PROVIDER: Kathi Ludwig PA-C  REFERRING DIAG:  [phone] (ICD-10-CM) - Idiopathic aseptic necrosis of right femur  Right UKA   THERAPY DIAG:  Acute pain of right knee  Stiffness of right knee, not elsewhere classified  Localized edema  Other abnormalities of gait and mobility  Rationale for Evaluation and Treatment Rehabilitation  ONSET DATE: 10/26/2021  Days since surgery: 10/26/2021  SUBJECTIVE:   SUBJECTIVE STATEMENT: The patient has had more stress lately which she feels like has made her more tense.    Patient had a unicompartmental knee replacement on . At this time she is limited by swelling. She has difficulty getting in and out of the car.  She had necrosis in her knee prior to surgery. She used a cane 2nd to the instability. She is using a walker now.   PERTINENT HISTORY: Right RTC repair; Right meniscal  repair;   PAIN:  Are you having pain? Yes: NPRS scale: 0/10 11/15 Pain location: right lateral knee and posterior knee  Pain description: stiffness   Aggravating factors: Standing , walking, activity and bending  Relieving factors: oxycodone   PRECAUTIONS: None  WEIGHT BEARING RESTRICTIONS Yes WBAT   FALLS:  Has patient fallen in last 6 months? No  LIVING ENVIRONMENT: No steps into her house  OCCUPATION: retried   PLOF: Independent with household mobility with device Was using a cane due to knee collapsing   PATIENT GOALS  To have less pain and to start being able to move again   OBJECTIVE:   DIAGNOSTIC  FINDINGS:  Nothing post op   PATIENT SURVEYS:  FOTO    COGNITION:  Overall cognitive status: Within functional limits for tasks assessed     SENSATION: Patient reports she does not think the block has worn off yet   EDEMA:  Circumferential: 53.6 48.3   POSTURE: rounded shoulders  PALPATION: No unexpected tenderness to palpation  LOWER EXTREMITY ROM:  Passive ROM Right eval Left eval 10/20 Right  Right  11/15 Right   Hip flexion       Hip extension       Hip abduction       Hip adduction       Hip internal rotation       Hip external rotation       Knee flexion 50 with pain and guarding  65 85 90  Knee extension -6  -4 -2 0  Ankle dorsiflexion       Ankle plantarflexion       Ankle inversion       Ankle eversion        (Blank rows = not tested)  LOWER EXTREMITY MMT:  MMT Right eval Left eval Right  Left   Hip flexion 2/5  30.1 34.1  Hip extension      Hip abduction   30.7 37.1  Hip adduction      Hip internal rotation      Hip external rotation      Knee flexion 3/5       Knee extension 2/5  21.6 25.2  Ankle dorsiflexion      Ankle plantarflexion      Ankle inversion      Ankle eversion       (Blank rows = not tested)  FUNCTIONAL TESTS:  Needs hands to stand and gaurding  Needs assist to get right leg onto the table.   GAIT: Decreased single leg stance on the right. Increased lean on the walker and trunk flexion; increased lateral movement away form the hip.    TODAY'S TREATMENT:  12/4  Manual: PROM into flexion and extension; trigger point release and STM to posterior knee and IT band;  ; patella heat and creep mobilization  PA and AP grade III and IV mobilization to the knee to improve flexion and extension;  mulligan flexion mobilization with strap  11/27 Manual: PROM into flexion and extension; trigger point release and STM to posterior knee and IT band;  ; patella heat and creep mobilization  PA and AP grade III and IV mobilization to  the knee to improve flexion and extension;  mulligan flexion mobilization with strap    11/22 Manual: PROM into flexion and extension; trigger point release and STM to posterior knee and IT band;  ; patella heat and creep mobilization  PA and AP grade III and IV mobilization to  the knee to improve flexion and extension;  mulligan flexion mobilization with strap    11/20 PA and AP grade III and IV mobilization to the knee to improve flexion and extension;   IASTYM to quad    SLR 3x10 1.5 LAQ 3x10 1.5lb   Step up 2x10 4 inch       PATIENT EDUCATION:  Education details: reviewed HEP and symptom management  Person educated: Patient Education method: Explanation, Demonstration, Tactile cues, Verbal cues, and Handouts Education comprehension: verbalized understanding, returned demonstration, verbal cues required, tactile cues required, and needs further education   HOME EXERCISE PROGRAM: Access Code: [email or web address] URL: [email or web address] Date: 11/03/2021 Prepared by: Lorayne Bender  Exercises - Supine Quad Set  - 5 x daily - 7 x weekly - 3 sets - 10 reps - 4-5 sec hold  hold - Supine Heel Slide with Strap  - 1 x daily - 7 x weekly - 3 sets - 5 reps - 10 sec  hold - Seated Ankle Pumps  - 5 x daily - 7 x weekly - 3 sets - 10 reps  ASSESSMENT:  CLINICAL IMPRESSION: Patient's passive range of motion range 90 degrees today.  She felt stiff prior to intervention but felt better after.  She was advised to be aggressive over the weekend.  Will be in contact with her MD next week pending her progress.   OBJECTIVE IMPAIRMENTS Abnormal gait, decreased activity tolerance, decreased balance, decreased endurance, decreased mobility, difficulty walking, decreased ROM, decreased strength, impaired perceived functional ability, and pain.   ACTIVITY LIMITATIONS carrying, lifting, bending, sitting, standing, squatting, stairs, transfers, bed mobility, and locomotion  level  PARTICIPATION LIMITATIONS: meal prep, cleaning, laundry, driving, shopping, community activity, and yard work  PERSONAL FACTORS 1-2 comorbidities: right RTC repair; prior right knee surgery   are also affecting patient's functional outcome.   REHAB POTENTIAL: Good significant pain   CLINICAL DECISION MAKING: Evolving/moderate complexity pain and swelling limiting her ability to get in an out of the car and bed   EVALUATION COMPLEXITY: Moderate   GOALS: Goals reviewed with patient? Yes  SHORT TERM GOALS: Target date: 12/02/2021  Patient will increase passive flexion to 75 degrees  Baseline: Goal status: achieved 11/15  2. Patient will increase hip flexor and knee extensor strength to 3+/5  Baseline:  Goal status:Improving 11/14   3.  Patient will demonstrate a 2 cm decrease in mid patella edema on the right  Baseline:  Goal status: per visual inspection significant improvement in swelling    LONG TERM GOALS: Target date: 12/02/2021   Patient will got up/down 6 steps without pain  Baseline:  Goal status: INITIAL  2.  Patient will bend knee 115 degrees without pain in order to get in and out of the car comfortably  Baseline:  Goal status: INITIAL  3.  Patient will ambulate 2000' with self report of no increase in pain with LRAD Baseline:  Goal status: INITIAL   PLAN: PT FREQUENCY: 2x/week  PT DURATION: 6 weeks  PLANNED INTERVENTIONS: Therapeutic exercises, Therapeutic activity, Neuromuscular re-education, Balance training, Gait training, Patient/Family education, Self Care, Joint mobilization, DME instructions, Aquatic Therapy, Electrical stimulation, Cryotherapy, Moist heat, Taping, Ultrasound, and Manual therapy  PLAN FOR NEXT SESSION: continue to progress exercises as tolerated. Continue with agressive ROM as tolerated.    Dessie Coma, PT 12/20/2021, 9:12 AM

## 2021-12-20 ENCOUNTER — Encounter (HOSPITAL_BASED_OUTPATIENT_CLINIC_OR_DEPARTMENT_OTHER): Payer: Self-pay | Admitting: Physical Therapy

## 2021-12-20 ENCOUNTER — Ambulatory Visit (HOSPITAL_BASED_OUTPATIENT_CLINIC_OR_DEPARTMENT_OTHER): Payer: Medicare Other | Admitting: Physical Therapy

## 2021-12-20 DIAGNOSIS — R600 Localized edema: Secondary | ICD-10-CM

## 2021-12-20 DIAGNOSIS — R2689 Other abnormalities of gait and mobility: Secondary | ICD-10-CM

## 2021-12-20 DIAGNOSIS — M25561 Pain in right knee: Secondary | ICD-10-CM | POA: Diagnosis not present

## 2021-12-20 DIAGNOSIS — M25661 Stiffness of right knee, not elsewhere classified: Secondary | ICD-10-CM

## 2021-12-20 NOTE — Therapy (Signed)
OUTPATIENT PHYSICAL THERAPY LOWER EXTREMITY Treatment/ Progress    Patient Name: Alicia [name] MRN: [id number] DOB:14-Jun-1955, 66 y.o., female Today's Date: 12/20/2021   Reporting Period 11/02/2021 to 11/172023  See note below for Objective Data and Assessment of Progress/Goals.       PT End of Session - 12/20/21 1121     Visit Number 15    Number of Visits 26    Date for PT Re-Evaluation 01/30/22    PT Start Time 1100    PT Stop Time 1140    PT Time Calculation (min) 40 min    Activity Tolerance Patient tolerated treatment well    Behavior During Therapy WFL for tasks assessed/performed              Past Medical History:  Diagnosis Date   Allergy    Cataract    removed bilateral -2014   Chronic tension headaches    Hx of varicella    Hypertension    Kidney stones    Near syncope    Nephrolithiasis    history of  passed on own.    Palpitations    Thyroid disease    pt. denies 06/11/19   Tubular adenoma of colon 02/1997   w/ HGD   Varicose veins    Past Surgical History:  Procedure Laterality Date   ABDOMINAL HYSTERECTOMY     fibroids non cancer    CATARACT EXTRACTION     Bilateral   cataract surgery Right 03/02/2012   CHOLECYSTECTOMY     KNEE SURGERY  04/2020   KNEE SURGERY     ROTATOR CUFF REPAIR Right    TONSILLECTOMY     Patient Active Problem List   Diagnosis Date Noted   Hypokalemia 01/25/2017   Thyroid disease    Palpitations    Nephrolithiasis    Near syncope    Kidney stones    Hypertension    Hx of varicella    Chronic tension headaches    Allergy    Nocturnal headaches 05/04/2016   Coccygodynia 01/29/2015   Atypical chest pain 09/09/2014   Essential hypertension 09/09/2014   Midline low back pain without sciatica 09/05/2013   Renal cyst 09/05/2013   Fatty infiltration of liver 18/29/9371   Umbilical hernia without obstruction and without gangrene 09/05/2013   Flank pain 07/29/2013   Encounter to establish care with new  doctor 07/11/2013   Left leg pain 07/11/2013   Cough 01/22/2013   Swelling in head/neck 10/31/2012   Routine health maintenance 04/05/2012   Unspecified vitamin D deficiency 04/04/2012   Varicose veins of lower extremities with other complications 69/67/8938   Chronic venous insufficiency 09/02/2011   Allergic rhinitis, cause unspecified 06/08/2011   Paresthesia of left arm 06/08/2011   Essential hypertension, benign 03/26/2010   OBESITY, CLASS III 10/02/2009   Other and unspecified coagulation defects 04/03/2009   PERIPHERAL EDEMA 10/22/2008   HEMORRHOIDS, INTERNAL, WITH BLEEDING 05/07/2008   NEPHROLITHIASIS, HX OF 04/28/2008   SYNCOPE 04/19/2007   ANXIETY DEPRESSION 10/31/2006   HYSTERECTOMY, VAGINAL, HX OF 10/31/2006    PCP: Dr Shanon Ace   REFERRING PROVIDER: Laqueta Jean PA-C  REFERRING DIAG:  [phone] (ICD-10-CM) - Idiopathic aseptic necrosis of right femur  Right UKA   THERAPY DIAG:  Acute pain of right knee  Stiffness of right knee, not elsewhere classified  Localized edema  Other abnormalities of gait and mobility  Rationale for Evaluation and Treatment Rehabilitation  ONSET DATE: 10/26/2021  Days since surgery: 10/26/2021  SUBJECTIVE:  SUBJECTIVE STATEMENT: The patient has had more stress lately which she feels like has made her more tense.    Patient had a unicompartmental knee replacement on . At this time she is limited by swelling. She has difficulty getting in and out of the car.  She had necrosis in her knee prior to surgery. She used a cane 2nd to the instability. She is using a walker now.   PERTINENT HISTORY: Right RTC repair; Right meniscal repair;   PAIN:  Are you having pain? Yes: NPRS scale: 0/10 11/15 Pain location: right lateral knee and posterior knee  Pain description: stiffness   Aggravating factors: Standing , walking, activity and bending  Relieving factors: oxycodone   PRECAUTIONS: None  WEIGHT BEARING RESTRICTIONS Yes  WBAT   FALLS:  Has patient fallen in last 6 months? No  LIVING ENVIRONMENT: No steps into her house  OCCUPATION: retried   PLOF: Independent with household mobility with device Was using a cane due to knee collapsing   PATIENT GOALS  To have less pain and to start being able to move again   OBJECTIVE:   DIAGNOSTIC FINDINGS:  Nothing post op   PATIENT SURVEYS:  FOTO    COGNITION:  Overall cognitive status: Within functional limits for tasks assessed     SENSATION: Patient reports she does not think the block has worn off yet   EDEMA:  Circumferential: 53.6 48.3   POSTURE: rounded shoulders  PALPATION: No unexpected tenderness to palpation  LOWER EXTREMITY ROM:  Passive ROM Right eval Left eval 10/20 Right  Right  11/15 Right   Hip flexion       Hip extension       Hip abduction       Hip adduction       Hip internal rotation       Hip external rotation       Knee flexion 50 with pain and guarding  65 85 90  Knee extension -6  -4 -2 0  Ankle dorsiflexion       Ankle plantarflexion       Ankle inversion       Ankle eversion        (Blank rows = not tested)  LOWER EXTREMITY MMT:  MMT Right eval Left eval Right  Left   Hip flexion 2/5  30.1 34.1  Hip extension      Hip abduction   30.7 37.1  Hip adduction      Hip internal rotation      Hip external rotation      Knee flexion 3/5       Knee extension 2/5  21.6 25.2  Ankle dorsiflexion      Ankle plantarflexion      Ankle inversion      Ankle eversion       (Blank rows = not tested)  FUNCTIONAL TESTS:  Needs hands to stand and gaurding  Needs assist to get right leg onto the table.   GAIT: Decreased single leg stance on the right. Increased lean on the walker and trunk flexion; increased lateral movement away form the hip.    TODAY'S TREATMENT:   12/4  Manual: PROM into flexion and extension; trigger point release and STM to posterior knee and IT band;  ; patella heat and creep  mobilization  PA and AP grade III and IV mobilization to the knee to improve flexion and extension;  mulligan flexion mobilization with strap  11/27 Manual: PROM into flexion  and extension; trigger point release and STM to posterior knee and IT band;  ; patella heat and creep mobilization  PA and AP grade III and IV mobilization to the knee to improve flexion and extension;  mulligan flexion mobilization with strap    11/22 Manual: PROM into flexion and extension; trigger point release and STM to posterior knee and IT band;  ; patella heat and creep mobilization  PA and AP grade III and IV mobilization to the knee to improve flexion and extension;  mulligan flexion mobilization with strap    11/20 PA and AP grade III and IV mobilization to the knee to improve flexion and extension;   IASTYM to quad    SLR 3x10 1.5 LAQ 3x10 1.5lb   Step up 2x10 4 inch       PATIENT EDUCATION:  Education details: reviewed HEP and symptom management  Person educated: Patient Education method: Explanation, Demonstration, Tactile cues, Verbal cues, and Handouts Education comprehension: verbalized understanding, returned demonstration, verbal cues required, tactile cues required, and needs further education   HOME EXERCISE PROGRAM: Access Code: [email or web address] URL: [email or web address] Date: 11/03/2021 Prepared by: Carolyne Littles  Exercises - Supine Quad Set  - 5 x daily - 7 x weekly - 3 sets - 10 reps - 4-5 sec hold  hold - Supine Heel Slide with Strap  - 1 x daily - 7 x weekly - 3 sets - 5 reps - 10 sec  hold - Seated Ankle Pumps  - 5 x daily - 7 x weekly - 3 sets - 10 reps  ASSESSMENT:  CLINICAL IMPRESSION: Patient continues to be around 90 degrees of flexion. She will contact her MD today. We will continue to focus on manual therapy. She will be back on Wednesday.   OBJECTIVE IMPAIRMENTS Abnormal gait, decreased activity tolerance, decreased balance, decreased endurance,  decreased mobility, difficulty walking, decreased ROM, decreased strength, impaired perceived functional ability, and pain.   ACTIVITY LIMITATIONS carrying, lifting, bending, sitting, standing, squatting, stairs, transfers, bed mobility, and locomotion level  PARTICIPATION LIMITATIONS: meal prep, cleaning, laundry, driving, shopping, community activity, and yard work  PERSONAL FACTORS 1-2 comorbidities: right RTC repair; prior right knee surgery   are also affecting patient's functional outcome.   REHAB POTENTIAL: Good significant pain   CLINICAL DECISION MAKING: Evolving/moderate complexity pain and swelling limiting her ability to get in an out of the car and bed   EVALUATION COMPLEXITY: Moderate   GOALS: Goals reviewed with patient? Yes  SHORT TERM GOALS: Target date: 12/02/2021  Patient will increase passive flexion to 75 degrees  Baseline: Goal status: achieved 11/15  2. Patient will increase hip flexor and knee extensor strength to 3+/5  Baseline:  Goal status:Improving 11/14   3.  Patient will demonstrate a 2 cm decrease in mid patella edema on the right  Baseline:  Goal status: per visual inspection significant improvement in swelling    LONG TERM GOALS: Target date: 12/02/2021   Patient will got up/down 6 steps without pain  Baseline:  Goal status: INITIAL  2.  Patient will bend knee 115 degrees without pain in order to get in and out of the car comfortably  Baseline:  Goal status: INITIAL  3.  Patient will ambulate 2000' with self report of no increase in pain with LRAD Baseline:  Goal status: INITIAL   PLAN: PT FREQUENCY: 2x/week  PT DURATION: 6 weeks  PLANNED INTERVENTIONS: Therapeutic exercises, Therapeutic activity, Neuromuscular re-education, Balance training, Gait training, Patient/Family education, Self Care, Joint  mobilization, DME instructions, Aquatic Therapy, Electrical stimulation, Cryotherapy, Moist heat, Taping, Ultrasound, and Manual  therapy  PLAN FOR NEXT SESSION: continue to progress exercises as tolerated. Continue with agressive ROM as tolerated.    Carney Living, PT 12/20/2021, 1:46 PM

## 2021-12-21 NOTE — Therapy (Signed)
OUTPATIENT PHYSICAL THERAPY LOWER EXTREMITY Treatment   Patient Name: Alicia [name] MRN: [id number] DOB:Apr 15, 1955, 66 y.o., female Today's Date: 12/23/2021        PT End of Session - 12/22/21 0930     Visit Number 16    Number of Visits 26    Date for PT Re-Evaluation 01/30/22    PT Start Time 0853    PT Stop Time 0933    PT Time Calculation (min) 40 min    Activity Tolerance Patient tolerated treatment well    Behavior During Therapy [facility] for tasks assessed/performed              Past Medical History:  Diagnosis Date   Allergy    Cataract    removed bilateral -2014   Chronic tension headaches    Hx of varicella    Hypertension    Kidney stones    Near syncope    Nephrolithiasis    history of  passed on own.    Palpitations    Thyroid disease    pt. denies 06/11/19   Tubular adenoma of colon 02/1997   w/ HGD   Varicose veins    Past Surgical History:  Procedure Laterality Date   ABDOMINAL HYSTERECTOMY     fibroids non cancer    CATARACT EXTRACTION     Bilateral   cataract surgery Right 03/02/2012   CHOLECYSTECTOMY     KNEE SURGERY  04/2020   KNEE SURGERY     ROTATOR CUFF REPAIR Right    TONSILLECTOMY     Patient Active Problem List   Diagnosis Date Noted   Hypokalemia 01/25/2017   Thyroid disease    Palpitations    Nephrolithiasis    Near syncope    Kidney stones    Hypertension    Hx of varicella    Chronic tension headaches    Allergy    Nocturnal headaches 05/04/2016   Coccygodynia 01/29/2015   Atypical chest pain 09/09/2014   Essential hypertension 09/09/2014   Midline low back pain without sciatica 09/05/2013   Renal cyst 09/05/2013   Fatty infiltration of liver 09/05/2013   Umbilical hernia without obstruction and without gangrene 09/05/2013   Flank pain 07/29/2013   Encounter to establish care with new doctor 07/11/2013   Left leg pain 07/11/2013   Cough 01/22/2013   Swelling in head/neck 10/31/2012   Routine health  maintenance 04/05/2012   Unspecified vitamin D deficiency 04/04/2012   Varicose veins of lower extremities with other complications 09/02/2011   Chronic venous insufficiency 09/02/2011   Allergic rhinitis, cause unspecified 06/08/2011   Paresthesia of left arm 06/08/2011   Essential hypertension, benign 03/26/2010   OBESITY, CLASS III 10/02/2009   Other and unspecified coagulation defects 04/03/2009   PERIPHERAL EDEMA 10/22/2008   HEMORRHOIDS, INTERNAL, WITH BLEEDING 05/07/2008   NEPHROLITHIASIS, HX OF 04/28/2008   SYNCOPE 04/19/2007   ANXIETY DEPRESSION 10/31/2006   HYSTERECTOMY, VAGINAL, HX OF 10/31/2006    PCP: Dr Berniece Andreas   REFERRING PROVIDER: Kathi Ludwig PA-C  REFERRING DIAG:  [phone] (ICD-10-CM) - Idiopathic aseptic necrosis of right femur  Right UKA   THERAPY DIAG:  Acute pain of right knee  Stiffness of right knee, not elsewhere classified  Localized edema  Other abnormalities of gait and mobility  Rationale for Evaluation and Treatment Rehabilitation  ONSET DATE: 10/27/2021 DOS  Days since surgery: 10/27/2021  SUBJECTIVE:   SUBJECTIVE STATEMENT: Pt is 8 weeks s/p R medial unicondylar knee arthroplasty.  MD's nurse emailed  pt informing her that MD was going to give her 2 more weeks to see if her flexion would improve before performing a manipulation.  Pt states she was informed MD wanted a 5-10 deg increase over the next 2 weeks.  She reports improved sx's.  She reports improved stability and tightness.  "It's not as tight as it has been".  Pt states she really doesn't have pain just tightness.  Pt denies any adverse effects after prior Rx and feels good after treatments.  Pt has returned to driving independently the past week.  Pt reports improved performance of car transfers.       PERTINENT HISTORY: Right RTC repair; Right meniscal repair;   PAIN:  Are you having pain? Yes: NPRS scale: 0/10  Pain location: right lateral knee and posterior knee  Pain  description: stiffness   Aggravating factors: Standing , walking, activity and bending  Relieving factors: oxycodone   PRECAUTIONS: None  WEIGHT BEARING RESTRICTIONS Yes WBAT   FALLS:  Has patient fallen in last 6 months? No  LIVING ENVIRONMENT: No steps into her house  OCCUPATION: retried   PLOF: Independent with household mobility with device Was using a cane due to knee collapsing   PATIENT GOALS  To have less pain and to start being able to move again   OBJECTIVE:   DIAGNOSTIC FINDINGS:  Nothing post op   PATIENT SURVEYS:  FOTO    COGNITION:  Overall cognitive status: Within functional limits for tasks assessed     SENSATION: Patient reports she does not think the block has worn off yet   EDEMA:  Circumferential: 53.6 48.3   POSTURE: rounded shoulders  PALPATION: No unexpected tenderness to palpation  LOWER EXTREMITY ROM:  Passive ROM Right eval Left eval 10/20 Right  Right  11/15 Right  Right  Hip flexion        Hip extension        Hip abduction        Hip adduction        Hip internal rotation        Hip external rotation        Knee flexion 50 with pain and guarding  65 85 90 AROM/PROM:  85-87/90  Knee extension -6  -4 -2 0 Lacking 1.5 deg  Ankle dorsiflexion        Ankle plantarflexion        Ankle inversion        Ankle eversion         (Blank rows = not tested)     TODAY'S TREATMENT: 12/6 Manual Therapy: Pt received: 4D Patellar mobs f/b grade III PA and PA jt mobs with knee flexed STM to R quad in sitting at EOT with knee flexed. Pt also received rolling of the R quad.  Pt received R knee flexion and extension PROM in supine. Hold relax to improve knee flexion seated at EOT x 3 sets PT assessed ROM  12/4  Manual: PROM into flexion and extension; trigger point release and STM to posterior knee and IT band;  ; patella heat and creep mobilization  PA and AP grade III and IV mobilization to the knee to improve flexion and  extension;  mulligan flexion mobilization with strap  11/27 Manual: PROM into flexion and extension; trigger point release and STM to posterior knee and IT band;  ; patella heat and creep mobilization  PA and AP grade III and IV mobilization to the knee to improve flexion and  extension;  mulligan flexion mobilization with strap    11/22 Manual: PROM into flexion and extension; trigger point release and STM to posterior knee and IT band;  ; patella heat and creep mobilization  PA and AP grade III and IV mobilization to the knee to improve flexion and extension;  mulligan flexion mobilization with strap    11/20 PA and AP grade III and IV mobilization to the knee to improve flexion and extension;   IASTYM to quad    SLR 3x10 1.5 LAQ 3x10 1.5lb   Step up 2x10 4 inch       PATIENT EDUCATION:  Education details: Instructed pt to cont with HEP.  symptom management  Person educated: Patient Education method: Explanation, Demonstration, Tactile cues, Verbal cues, and Handouts Education comprehension: verbalized understanding, returned demonstration, verbal cues required, tactile cues required, and needs further education   HOME EXERCISE PROGRAM: Access Code: [email or web address] URL: [email or web address] Date: 11/03/2021 Prepared by: Lorayne Bender  Exercises - Supine Quad Set  - 5 x daily - 7 x weekly - 3 sets - 10 reps - 4-5 sec hold  hold - Supine Heel Slide with Strap  - 1 x daily - 7 x weekly - 3 sets - 5 reps - 10 sec  hold - Seated Ankle Pumps  - 5 x daily - 7 x weekly - 3 sets - 10 reps  ASSESSMENT:  CLINICAL IMPRESSION: Pt is improving as evidenced by subjective reports.  She reports improved tightness and improved performance of car transfers.  Pt has also returned to driving independently.  Pt continues to have stiffness and limitations mostly in flexion ROM.  She was slightly limited in extension today.  Pt tolerated manual treatment well.  She responded well to  Rx reporting her knee felt good after Rx.  She states her knee felt better after Rx than when she came in, not as tight.  She did have some soreness in quad.  Pt should benefit from cont skilled PT services per protocol to improve ROM, stiffness, strength, and function.  OBJECTIVE IMPAIRMENTS Abnormal gait, decreased activity tolerance, decreased balance, decreased endurance, decreased mobility, difficulty walking, decreased ROM, decreased strength, impaired perceived functional ability, and pain.   ACTIVITY LIMITATIONS carrying, lifting, bending, sitting, standing, squatting, stairs, transfers, bed mobility, and locomotion level  PARTICIPATION LIMITATIONS: meal prep, cleaning, laundry, driving, shopping, community activity, and yard work  PERSONAL FACTORS 1-2 comorbidities: right RTC repair; prior right knee surgery   are also affecting patient's functional outcome.   REHAB POTENTIAL: Good significant pain   CLINICAL DECISION MAKING: Evolving/moderate complexity pain and swelling limiting her ability to get in an out of the car and bed   EVALUATION COMPLEXITY: Moderate   GOALS: Goals reviewed with patient? Yes  SHORT TERM GOALS: Target date: 12/02/2021  Patient will increase passive flexion to 75 degrees  Baseline: Goal status: achieved 11/15  2. Patient will increase hip flexor and knee extensor strength to 3+/5  Baseline:  Goal status:Improving 11/14   3.  Patient will demonstrate a 2 cm decrease in mid patella edema on the right  Baseline:  Goal status: per visual inspection significant improvement in swelling    LONG TERM GOALS: Target date: 12/02/2021   Patient will got up/down 6 steps without pain  Baseline:  Goal status: INITIAL  2.  Patient will bend knee 115 degrees without pain in order to get in and out of the car comfortably  Baseline:  Goal status: INITIAL  3.  Patient  will ambulate 2000' with self report of no increase in pain with LRAD Baseline:  Goal  status: INITIAL   PLAN: PT FREQUENCY: 2x/week  PT DURATION: 6 weeks  PLANNED INTERVENTIONS: Therapeutic exercises, Therapeutic activity, Neuromuscular re-education, Balance training, Gait training, Patient/Family education, Self Care, Joint mobilization, DME instructions, Aquatic Therapy, Electrical stimulation, Cryotherapy, Moist heat, Taping, Ultrasound, and Manual therapy  PLAN FOR NEXT SESSION: continue to progress exercises as tolerated. Continue with agressive ROM as tolerated.  Cont with manual techniques.  MD is holding off of the manipulation for another 2 weeks.  He hopes to see a 5-10 deg improvement in flexion.    [name] Clear III PT, DPT 12/23/21 9:17 AM

## 2021-12-22 ENCOUNTER — Ambulatory Visit (HOSPITAL_BASED_OUTPATIENT_CLINIC_OR_DEPARTMENT_OTHER): Payer: Medicare Other | Admitting: Physical Therapy

## 2021-12-22 ENCOUNTER — Encounter (HOSPITAL_BASED_OUTPATIENT_CLINIC_OR_DEPARTMENT_OTHER): Payer: Self-pay | Admitting: Physical Therapy

## 2021-12-22 DIAGNOSIS — R600 Localized edema: Secondary | ICD-10-CM

## 2021-12-22 DIAGNOSIS — M25661 Stiffness of right knee, not elsewhere classified: Secondary | ICD-10-CM

## 2021-12-22 DIAGNOSIS — M25561 Pain in right knee: Secondary | ICD-10-CM | POA: Diagnosis not present

## 2021-12-22 DIAGNOSIS — R2689 Other abnormalities of gait and mobility: Secondary | ICD-10-CM

## 2021-12-24 ENCOUNTER — Encounter (HOSPITAL_BASED_OUTPATIENT_CLINIC_OR_DEPARTMENT_OTHER): Payer: Medicare Other | Admitting: Physical Therapy

## 2021-12-29 ENCOUNTER — Ambulatory Visit (HOSPITAL_BASED_OUTPATIENT_CLINIC_OR_DEPARTMENT_OTHER): Payer: Medicare Other | Admitting: Physical Therapy

## 2021-12-29 ENCOUNTER — Encounter (HOSPITAL_BASED_OUTPATIENT_CLINIC_OR_DEPARTMENT_OTHER): Payer: Self-pay | Admitting: Physical Therapy

## 2021-12-29 DIAGNOSIS — M25661 Stiffness of right knee, not elsewhere classified: Secondary | ICD-10-CM

## 2021-12-29 DIAGNOSIS — R600 Localized edema: Secondary | ICD-10-CM

## 2021-12-29 DIAGNOSIS — M25561 Pain in right knee: Secondary | ICD-10-CM

## 2021-12-29 DIAGNOSIS — R2689 Other abnormalities of gait and mobility: Secondary | ICD-10-CM

## 2021-12-29 NOTE — Therapy (Signed)
OUTPATIENT PHYSICAL THERAPY LOWER EXTREMITY Treatment   Patient Name: Alicia [name] MRN: [id number] DOB:May 22, 1955, 66 y.o., female Today's Date: 12/23/2021        PT End of Session - 12/22/21 0930     Visit Number 16    Number of Visits 26    Date for PT Re-Evaluation 01/30/22    PT Start Time 0853    PT Stop Time 0933    PT Time Calculation (min) 40 min    Activity Tolerance Patient tolerated treatment well    Behavior During Therapy [facility] for tasks assessed/performed              Past Medical History:  Diagnosis Date   Allergy    Cataract    removed bilateral -2014   Chronic tension headaches    Hx of varicella    Hypertension    Kidney stones    Near syncope    Nephrolithiasis    history of  passed on own.    Palpitations    Thyroid disease    pt. denies 06/11/19   Tubular adenoma of colon 02/1997   w/ HGD   Varicose veins    Past Surgical History:  Procedure Laterality Date   ABDOMINAL HYSTERECTOMY     fibroids non cancer    CATARACT EXTRACTION     Bilateral   cataract surgery Right 03/02/2012   CHOLECYSTECTOMY     KNEE SURGERY  04/2020   KNEE SURGERY     ROTATOR CUFF REPAIR Right    TONSILLECTOMY     Patient Active Problem List   Diagnosis Date Noted   Hypokalemia 01/25/2017   Thyroid disease    Palpitations    Nephrolithiasis    Near syncope    Kidney stones    Hypertension    Hx of varicella    Chronic tension headaches    Allergy    Nocturnal headaches 05/04/2016   Coccygodynia 01/29/2015   Atypical chest pain 09/09/2014   Essential hypertension 09/09/2014   Midline low back pain without sciatica 09/05/2013   Renal cyst 09/05/2013   Fatty infiltration of liver 01/09/8249   Umbilical hernia without obstruction and without gangrene 09/05/2013   Flank pain 07/29/2013   Encounter to establish care with new doctor 07/11/2013   Left leg pain 07/11/2013   Cough 01/22/2013   Swelling in head/neck 10/31/2012   Routine health  maintenance 04/05/2012   Unspecified vitamin D deficiency 04/04/2012   Varicose veins of lower extremities with other complications 03/70/4888   Chronic venous insufficiency 09/02/2011   Allergic rhinitis, cause unspecified 06/08/2011   Paresthesia of left arm 06/08/2011   Essential hypertension, benign 03/26/2010   OBESITY, CLASS III 10/02/2009   Other and unspecified coagulation defects 04/03/2009   PERIPHERAL EDEMA 10/22/2008   HEMORRHOIDS, INTERNAL, WITH BLEEDING 05/07/2008   NEPHROLITHIASIS, HX OF 04/28/2008   SYNCOPE 04/19/2007   ANXIETY DEPRESSION 10/31/2006   HYSTERECTOMY, VAGINAL, HX OF 10/31/2006    PCP: Dr Shanon Ace   REFERRING PROVIDER: Laqueta Jean PA-C  REFERRING DIAG:  [phone] (ICD-10-CM) - Idiopathic aseptic necrosis of right femur  Right UKA   THERAPY DIAG:  Acute pain of right knee  Stiffness of right knee, not elsewhere classified  Localized edema  Other abnormalities of gait and mobility  Rationale for Evaluation and Treatment Rehabilitation  ONSET DATE: 10/27/2021 DOS  Days since surgery: 10/27/2021  SUBJECTIVE:   SUBJECTIVE STATEMENT: Pt is 9 weeks s/p R medial unicondylar knee arthroplasty.  MD's nurse emailed  pt informing her that MD was going to give her 2 more weeks to see if her flexion would improve before performing a manipulation.  Pt states she was informed MD wanted a 5-10 deg increase over the next 2 weeks.  She reports improved sx's.  She reports improved stability and tightness.  "It's not as tight as it has been".  Pt states she really doesn't have pain just tightness.  Pt denies any adverse effects after prior Rx and feels good after treatments.  Pt has returned to driving independently the past week.  Pt reports improved performance of car transfers.    Pt states she felt good after prior Rx.  Pt states she felt a little week the following day, but no pain.  Pt has been performing the recumbent bike, Nustep, and octane x ride in the  gym.  Pt has also been performing the steps in the gym.  Pt had to drive 2 hours from [place] yesterday and had increased stiffness.  Pt denies pain and reports stiffness.  Pt states she doesn't have the tight band anymore.       PERTINENT HISTORY: Right RTC repair; Right meniscal repair;   PAIN:  Are you having pain? Yes: NPRS scale: 0/10  Pain location: right lateral knee and posterior knee  Pain description: stiffness   Aggravating factors: Standing , walking, activity and bending  Relieving factors: oxycodone   PRECAUTIONS: None  WEIGHT BEARING RESTRICTIONS Yes WBAT   FALLS:  Has patient fallen in last 6 months? No  LIVING ENVIRONMENT: No steps into her house  OCCUPATION: retried   PLOF: Independent with household mobility with device Was using a cane due to knee collapsing   PATIENT GOALS  To have less pain and to start being able to move again   OBJECTIVE:   DIAGNOSTIC FINDINGS:  Nothing post op   PATIENT SURVEYS:  FOTO    COGNITION:  Overall cognitive status: Within functional limits for tasks assessed     SENSATION: Patient reports she does not think the block has worn off yet   EDEMA:  Circumferential: 53.6 48.3   POSTURE: rounded shoulders  PALPATION: No unexpected tenderness to palpation  LOWER EXTREMITY ROM:  Passive ROM Right eval Left eval 10/20 Right  Right  11/15 Right  Right Rigth 12/13  Hip flexion         Hip extension         Hip abduction         Hip adduction         Hip internal rotation         Hip external rotation         Knee flexion 50 with pain and guarding  65 85 90 AROM/PROM:  85-87/90 AROM:  89 PROM:  92 deg   Knee extension -6  -4 -2 0 Lacking 1.5 deg   Ankle dorsiflexion         Ankle plantarflexion         Ankle inversion         Ankle eversion          (Blank rows = not tested)     TODAY'S TREATMENT: 12/13 Therapeutic Exercise: Recumbent bike rocking back and forth x 6 mins Supine heel slides with  strap with 5 sec hold x 10 reps Seated knee flexion stretch with contralateral LE with 10 sec hold  Manual Therapy: Pt received: 4D Patellar mobs f/b grade III PA and PA jt mobs with  knee flexed STM to R quad in sitting at EOT with knee flexed. Pt also received rolling of the R quad.  Pt received R knee flexion and extension PROM in supine. Hold relax to improve knee flexion seated at EOT x 3 sets PT assessed ROM   12/6 Manual Therapy: Pt received: 4D Patellar mobs f/b grade III PA and PA jt mobs with knee flexed STM to R quad in sitting at EOT with knee flexed. Pt also received rolling of the R quad.  Pt received R knee flexion and extension PROM in supine. Hold relax to improve knee flexion seated at EOT x 3 sets PT assessed ROM  12/4  Manual: PROM into flexion and extension; trigger point release and STM to posterior knee and IT band;  ; patella heat and creep mobilization  PA and AP grade III and IV mobilization to the knee to improve flexion and extension;  mulligan flexion mobilization with strap  11/27 Manual: PROM into flexion and extension; trigger point release and STM to posterior knee and IT band;  ; patella heat and creep mobilization  PA and AP grade III and IV mobilization to the knee to improve flexion and extension;  mulligan flexion mobilization with strap    11/22 Manual: PROM into flexion and extension; trigger point release and STM to posterior knee and IT band;  ; patella heat and creep mobilization  PA and AP grade III and IV mobilization to the knee to improve flexion and extension;  mulligan flexion mobilization with strap    11/20 PA and AP grade III and IV mobilization to the knee to improve flexion and extension;   IASTYM to quad    SLR 3x10 1.5 LAQ 3x10 1.5lb   Step up 2x10 4 inch       PATIENT EDUCATION:  Education details: Instructed pt to cont with HEP.  symptom management  Person educated: Patient Education method: Explanation,  Demonstration, Tactile cues, Verbal cues, and Handouts Education comprehension: verbalized understanding, returned demonstration, verbal cues required, tactile cues required, and needs further education   HOME EXERCISE PROGRAM: Access Code: [email or web address] URL: [email or web address] Date: 11/03/2021 Prepared by: Carolyne Littles  Exercises - Supine Quad Set  - 5 x daily - 7 x weekly - 3 sets - 10 reps - 4-5 sec hold  hold - Supine Heel Slide with Strap  - 1 x daily - 7 x weekly - 3 sets - 5 reps - 10 sec  hold - Seated Ankle Pumps  - 5 x daily - 7 x weekly - 3 sets - 10 reps  ASSESSMENT:  CLINICAL IMPRESSION: Pt is improving as evidenced by subjective reports.  She reports improved tightness and improved performance of car transfers.  Pt has also returned to driving independently.  Pt continues to have stiffness and limitations mostly in flexion ROM.  She was slightly limited in extension today.  Pt tolerated manual treatment well.  She responded well to Rx reporting her knee felt good after Rx.  She states her knee felt better after Rx than when she came in, not as tight.  She did have some soreness in quad.  Pt should benefit from cont skilled PT services per protocol to improve ROM, stiffness, strength, and function.  Pt reports improved sx's having no tight band feeling Feesl good no pian  OBJECTIVE IMPAIRMENTS Abnormal gait, decreased activity tolerance, decreased balance, decreased endurance, decreased mobility, difficulty walking, decreased ROM, decreased strength, impaired perceived functional ability, and pain.   ACTIVITY  LIMITATIONS carrying, lifting, bending, sitting, standing, squatting, stairs, transfers, bed mobility, and locomotion level  PARTICIPATION LIMITATIONS: meal prep, cleaning, laundry, driving, shopping, community activity, and yard work  PERSONAL FACTORS 1-2 comorbidities: right RTC repair; prior right knee surgery   are also affecting patient's functional  outcome.   REHAB POTENTIAL: Good significant pain   CLINICAL DECISION MAKING: Evolving/moderate complexity pain and swelling limiting her ability to get in an out of the car and bed   EVALUATION COMPLEXITY: Moderate   GOALS: Goals reviewed with patient? Yes  SHORT TERM GOALS: Target date: 12/02/2021  Patient will increase passive flexion to 75 degrees  Baseline: Goal status: achieved 11/15  2. Patient will increase hip flexor and knee extensor strength to 3+/5  Baseline:  Goal status:Improving 11/14   3.  Patient will demonstrate a 2 cm decrease in mid patella edema on the right  Baseline:  Goal status: per visual inspection significant improvement in swelling    LONG TERM GOALS: Target date: 12/02/2021   Patient will got up/down 6 steps without pain  Baseline:  Goal status: INITIAL  2.  Patient will bend knee 115 degrees without pain in order to get in and out of the car comfortably  Baseline:  Goal status: INITIAL  3.  Patient will ambulate 2000' with self report of no increase in pain with LRAD Baseline:  Goal status: INITIAL   PLAN: PT FREQUENCY: 2x/week  PT DURATION: 6 weeks  PLANNED INTERVENTIONS: Therapeutic exercises, Therapeutic activity, Neuromuscular re-education, Balance training, Gait training, Patient/Family education, Self Care, Joint mobilization, DME instructions, Aquatic Therapy, Electrical stimulation, Cryotherapy, Moist heat, Taping, Ultrasound, and Manual therapy  PLAN FOR NEXT SESSION: continue to progress exercises as tolerated. Continue with agressive ROM as tolerated.  Cont with manual techniques.  MD is holding off of the manipulation for another 2 weeks.  He hopes to see a 5-10 deg improvement in flexion.    Selinda Michaels III PT, DPT 12/23/21 9:17 AM

## 2021-12-31 ENCOUNTER — Ambulatory Visit (HOSPITAL_BASED_OUTPATIENT_CLINIC_OR_DEPARTMENT_OTHER): Payer: Medicare Other | Admitting: Physical Therapy

## 2021-12-31 ENCOUNTER — Encounter (HOSPITAL_BASED_OUTPATIENT_CLINIC_OR_DEPARTMENT_OTHER): Payer: Self-pay | Admitting: Physical Therapy

## 2021-12-31 DIAGNOSIS — M25561 Pain in right knee: Secondary | ICD-10-CM | POA: Diagnosis not present

## 2021-12-31 DIAGNOSIS — R2689 Other abnormalities of gait and mobility: Secondary | ICD-10-CM

## 2021-12-31 DIAGNOSIS — M25661 Stiffness of right knee, not elsewhere classified: Secondary | ICD-10-CM

## 2021-12-31 DIAGNOSIS — R600 Localized edema: Secondary | ICD-10-CM

## 2021-12-31 NOTE — Therapy (Signed)
OUTPATIENT PHYSICAL THERAPY LOWER EXTREMITY Treatment   Patient Name: Alicia [name] MRN: [id number] DOB:04-Nov-1955, 66 y.o., female Today's Date: 12/31/2021        PT End of Session - 12/31/21 0810     Visit Number 18    Number of Visits 26    Date for PT Re-Evaluation 01/30/22    Authorization Type re-assement next visit    PT Start Time 0800    PT Stop Time 0842    PT Time Calculation (min) 42 min    Activity Tolerance Patient tolerated treatment well    Behavior During Therapy [facility] for tasks assessed/performed              Past Medical History:  Diagnosis Date   Allergy    Cataract    removed bilateral -2014   Chronic tension headaches    Hx of varicella    Hypertension    Kidney stones    Near syncope    Nephrolithiasis    history of  passed on own.    Palpitations    Thyroid disease    pt. denies 06/11/19   Tubular adenoma of colon 02/1997   w/ HGD   Varicose veins    Past Surgical History:  Procedure Laterality Date   ABDOMINAL HYSTERECTOMY     fibroids non cancer    CATARACT EXTRACTION     Bilateral   cataract surgery Right 03/02/2012   CHOLECYSTECTOMY     KNEE SURGERY  04/2020   KNEE SURGERY     ROTATOR CUFF REPAIR Right    TONSILLECTOMY     Patient Active Problem List   Diagnosis Date Noted   Hypokalemia 01/25/2017   Thyroid disease    Palpitations    Nephrolithiasis    Near syncope    Kidney stones    Hypertension    Hx of varicella    Chronic tension headaches    Allergy    Nocturnal headaches 05/04/2016   Coccygodynia 01/29/2015   Atypical chest pain 09/09/2014   Essential hypertension 09/09/2014   Midline low back pain without sciatica 09/05/2013   Renal cyst 09/05/2013   Fatty infiltration of liver 01/60/1093   Umbilical hernia without obstruction and without gangrene 09/05/2013   Flank pain 07/29/2013   Encounter to establish care with new doctor 07/11/2013   Left leg pain 07/11/2013   Cough 01/22/2013   Swelling  in head/neck 10/31/2012   Routine health maintenance 04/05/2012   Unspecified vitamin D deficiency 04/04/2012   Varicose veins of lower extremities with other complications 23/55/7322   Chronic venous insufficiency 09/02/2011   Allergic rhinitis, cause unspecified 06/08/2011   Paresthesia of left arm 06/08/2011   Essential hypertension, benign 03/26/2010   OBESITY, CLASS III 10/02/2009   Other and unspecified coagulation defects 04/03/2009   PERIPHERAL EDEMA 10/22/2008   HEMORRHOIDS, INTERNAL, WITH BLEEDING 05/07/2008   NEPHROLITHIASIS, HX OF 04/28/2008   SYNCOPE 04/19/2007   ANXIETY DEPRESSION 10/31/2006   HYSTERECTOMY, VAGINAL, HX OF 10/31/2006    PCP: Dr Shanon Ace   REFERRING PROVIDER: Laqueta Jean PA-C  REFERRING DIAG:  M87.051 (ICD-10-CM) - Idiopathic aseptic necrosis of right femur  Right UKA   THERAPY DIAG:  No diagnosis found.  Rationale for Evaluation and Treatment Rehabilitation  ONSET DATE: 10/27/2021 DOS  Days since surgery: 10/27/2021  SUBJECTIVE:   SUBJECTIVE STATEMENT: The patient is a little sore this morning but she did a lot of walking yesterday.    PERTINENT HISTORY: Right RTC repair; Right meniscal  repair;   PAIN:  Are you having pain? Yes: NPRS scale: 0/10  Pain location: right lateral knee and posterior knee  Pain description: stiffness   Aggravating factors: Standing , walking, activity and bending  Relieving factors: oxycodone   PRECAUTIONS: None  WEIGHT BEARING RESTRICTIONS Yes WBAT   FALLS:  Has patient fallen in last 6 months? No  LIVING ENVIRONMENT: No steps into her house  OCCUPATION: retried   PLOF: Independent with household mobility with device Was using a cane due to knee collapsing   PATIENT GOALS  To have less pain and to start being able to move again   OBJECTIVE:   DIAGNOSTIC FINDINGS:  Nothing post op   PATIENT SURVEYS:  FOTO    COGNITION:  Overall cognitive status: Within functional limits for tasks  assessed     SENSATION: Patient reports she does not think the block has worn off yet   EDEMA:  Circumferential: 53.6 48.3   POSTURE: rounded shoulders  PALPATION: No unexpected tenderness to palpation  LOWER EXTREMITY ROM:  Passive ROM Right eval Left eval 10/20 Right  Right  11/15 Right  Right Rigth 12/13  Hip flexion         Hip extension         Hip abduction         Hip adduction         Hip internal rotation         Hip external rotation         Knee flexion 50 with pain and guarding  65 85 90 AROM/PROM:  85-87/90 AROM:  89 PROM:  92 deg   Knee extension -6  -4 -2 0 Lacking 1.5 deg   Ankle dorsiflexion         Ankle plantarflexion         Ankle inversion         Ankle eversion          (Blank rows = not tested)     TODAY'S TREATMENT: 12/15 Manual: PROM into flexion and extension; trigger point release and STM to posterior knee and IT band;  ; patella heat and creep mobilization  PA and AP grade III and IV mobilization to the knee to improve flexion and extension;  mulligan flexion mobilization with strap  12/13 Therapeutic Exercise: Recumbent bike rocking back and forth x 6 mins Supine heel slides with strap with 5 sec hold x 12 reps Seated knee flexion stretch with contralateral LE with 10 sec hold  Manual Therapy: Pt received: 4D Patellar mobs f/b grade III PA and PA jt mobs with knee flexed STM to R quad in sitting at EOT with knee flexed. Pt also received rolling of the R quad.  Pt received R knee flexion and extension PROM in supine. Hold relax to improve knee flexion seated at EOT x 3 sets PT assessed ROM      PATIENT EDUCATION:  Education details: Instructed pt to cont with HEP.  symptom management  Person educated: Patient Education method: Explanation, Demonstration, Tactile cues, Verbal cues, and Handouts Education comprehension: verbalized understanding, returned demonstration, verbal cues required, tactile cues required, and needs  further education   HOME EXERCISE PROGRAM: Access Code: [email or web address] URL: [email or web address] Date: 11/03/2021 Prepared by: Carolyne Littles  Exercises - Supine Quad Set  - 5 x daily - 7 x weekly - 3 sets - 10 reps - 4-5 sec hold  hold - Supine Heel Slide with Strap  - 1 x daily -  7 x weekly - 3 sets - 5 reps - 10 sec  hold - Seated Ankle Pumps  - 5 x daily - 7 x weekly - 3 sets - 10 reps  ASSESSMENT:  CLINICAL IMPRESSION: We continue to work on manual therapy at the clinic. She is doing stairs and walking at home. She has been working hard on her range at home and at the gym as well. She is consistently making progress it is just slow and steady. She hit 94 today after strap mobilization.  OBJECTIVE IMPAIRMENTS Abnormal gait, decreased activity tolerance, decreased balance, decreased endurance, decreased mobility, difficulty walking, decreased ROM, decreased strength, impaired perceived functional ability, and pain.   ACTIVITY LIMITATIONS carrying, lifting, bending, sitting, standing, squatting, stairs, transfers, bed mobility, and locomotion level  PARTICIPATION LIMITATIONS: meal prep, cleaning, laundry, driving, shopping, community activity, and yard work  PERSONAL FACTORS 1-2 comorbidities: right RTC repair; prior right knee surgery   are also affecting patient's functional outcome.   REHAB POTENTIAL: Good significant pain   CLINICAL DECISION MAKING: Evolving/moderate complexity pain and swelling limiting her ability to get in an out of the car and bed   EVALUATION COMPLEXITY: Moderate   GOALS: Goals reviewed with patient? Yes  SHORT TERM GOALS: Target date: 12/02/2021  Patient will increase passive flexion to 75 degrees  Baseline: Goal status: achieved 11/15  2. Patient will increase hip flexor and knee extensor strength to 3+/5  Baseline:  Goal status:Improving 11/14   3.  Patient will demonstrate a 2 cm decrease in mid patella edema on the right   Baseline:  Goal status: per visual inspection significant improvement in swelling    LONG TERM GOALS: Target date: 12/02/2021   Patient will got up/down 6 steps without pain  Baseline:  Goal status: INITIAL  2.  Patient will bend knee 115 degrees without pain in order to get in and out of the car comfortably  Baseline:  Goal status: INITIAL  3.  Patient will ambulate 2000' with self report of no increase in pain with LRAD Baseline:  Goal status: INITIAL   PLAN: PT FREQUENCY: 2x/week  PT DURATION: 6 weeks  PLANNED INTERVENTIONS: Therapeutic exercises, Therapeutic activity, Neuromuscular re-education, Balance training, Gait training, Patient/Family education, Self Care, Joint mobilization, DME instructions, Aquatic Therapy, Electrical stimulation, Cryotherapy, Moist heat, Taping, Ultrasound, and Manual therapy  PLAN FOR NEXT SESSION: continue to progress exercises as tolerated. Continue with agressive ROM as tolerated.  Cont with manual techniques.  MD is holding off of the manipulation for another week.  He hopes to see a 5-10 deg improvement in flexion.    Carolyne Littles PT DPT  12/31/21 8:30 AM

## 2022-01-05 ENCOUNTER — Ambulatory Visit (HOSPITAL_BASED_OUTPATIENT_CLINIC_OR_DEPARTMENT_OTHER): Payer: Medicare Other | Admitting: Physical Therapy

## 2022-01-05 ENCOUNTER — Encounter (HOSPITAL_BASED_OUTPATIENT_CLINIC_OR_DEPARTMENT_OTHER): Payer: Self-pay | Admitting: Physical Therapy

## 2022-01-05 DIAGNOSIS — M25561 Pain in right knee: Secondary | ICD-10-CM | POA: Diagnosis not present

## 2022-01-05 DIAGNOSIS — R2689 Other abnormalities of gait and mobility: Secondary | ICD-10-CM

## 2022-01-05 DIAGNOSIS — R600 Localized edema: Secondary | ICD-10-CM

## 2022-01-05 DIAGNOSIS — M25661 Stiffness of right knee, not elsewhere classified: Secondary | ICD-10-CM

## 2022-01-05 NOTE — Therapy (Signed)
OUTPATIENT PHYSICAL THERAPY LOWER EXTREMITY Treatment   Patient Name: Alicia [name] MRN: [id number] DOB:1955/07/27, 66 y.o., female Today's Date: 01/05/2022           Past Medical History:  Diagnosis Date   Allergy    Cataract    removed bilateral -2014   Chronic tension headaches    Hx of varicella    Hypertension    Kidney stones    Near syncope    Nephrolithiasis    history of  passed on own.    Palpitations    Thyroid disease    pt. denies 06/11/19   Tubular adenoma of colon 02/1997   w/ HGD   Varicose veins    Past Surgical History:  Procedure Laterality Date   ABDOMINAL HYSTERECTOMY     fibroids non cancer    CATARACT EXTRACTION     Bilateral   cataract surgery Right 03/02/2012   CHOLECYSTECTOMY     KNEE SURGERY  04/2020   KNEE SURGERY     ROTATOR CUFF REPAIR Right    TONSILLECTOMY     Patient Active Problem List   Diagnosis Date Noted   Hypokalemia 01/25/2017   Thyroid disease    Palpitations    Nephrolithiasis    Near syncope    Kidney stones    Hypertension    Hx of varicella    Chronic tension headaches    Allergy    Nocturnal headaches 05/04/2016   Coccygodynia 01/29/2015   Atypical chest pain 09/09/2014   Essential hypertension 09/09/2014   Midline low back pain without sciatica 09/05/2013   Renal cyst 09/05/2013   Fatty infiltration of liver 45/03/8880   Umbilical hernia without obstruction and without gangrene 09/05/2013   Flank pain 07/29/2013   Encounter to establish care with new doctor 07/11/2013   Left leg pain 07/11/2013   Cough 01/22/2013   Swelling in head/neck 10/31/2012   Routine health maintenance 04/05/2012   Unspecified vitamin D deficiency 04/04/2012   Varicose veins of lower extremities with other complications 80/03/4915   Chronic venous insufficiency 09/02/2011   Allergic rhinitis, cause unspecified 06/08/2011   Paresthesia of left arm 06/08/2011   Essential hypertension, benign 03/26/2010   OBESITY,  CLASS III 10/02/2009   Other and unspecified coagulation defects 04/03/2009   PERIPHERAL EDEMA 10/22/2008   HEMORRHOIDS, INTERNAL, WITH BLEEDING 05/07/2008   NEPHROLITHIASIS, HX OF 04/28/2008   SYNCOPE 04/19/2007   ANXIETY DEPRESSION 10/31/2006   HYSTERECTOMY, VAGINAL, HX OF 10/31/2006    PCP: Dr Shanon Ace   REFERRING PROVIDER: Laqueta Jean PA-C  REFERRING DIAG:  M87.051 (ICD-10-CM) - Idiopathic aseptic necrosis of right femur  Right UKA   THERAPY DIAG:  No diagnosis found.  Rationale for Evaluation and Treatment Rehabilitation  ONSET DATE: 10/27/2021 DOS  Days since surgery: 10/27/2021  SUBJECTIVE:   SUBJECTIVE STATEMENT: Pt is 10 weeks s/p R UKA.  "I really don't have pain, just the stiffness that comes and goes."  Pt states she felt good after prior Rx.  Pt reports improved performance of stairs and was able to go up/down the stairs here at [facility] twice.  Pt states she doesn't think about her leg as much t/o the day which is an improvement.  Pt reports having difficulty with bending knee though feels that is getting better.  Pt reports improved performance of car transfers.  Pt has been using the Nustep, bike, octane xride and row machine at the gym.  She is also performing stairs in the gym.  PERTINENT HISTORY: Right RTC repair; Right meniscal repair;   PAIN:  Are you having pain? Yes: NPRS scale: 0/10  Pain location: right lateral knee and posterior knee  Pain description: stiffness   Aggravating factors: Standing , walking, activity and bending  Relieving factors: oxycodone   PRECAUTIONS: None  WEIGHT BEARING RESTRICTIONS Yes WBAT   FALLS:  Has patient fallen in last 6 months? No  LIVING ENVIRONMENT: No steps into her house  OCCUPATION: retried   PLOF: Independent with household mobility with device Was using a cane due to knee collapsing   PATIENT GOALS  To have less pain and to start being able to move again   OBJECTIVE:   DIAGNOSTIC  FINDINGS:  Nothing post op   TODAY'S TREATMENT:  PATIENT SURVEYS:  Pt not in FOTO. LEFS:  49/80  LOWER EXTREMITY ROM:  Passive ROM Right eval Left eval 10/20 Right  Right  11/15 Right  Right Right 12/13 Right 12/20  Hip flexion          Hip extension          Hip abduction          Hip adduction          Hip internal rotation          Hip external rotation          Knee flexion 50 with pain and guarding  65 85 90 AROM/PROM:  85-87/90 AROM:  89 PROM:  92 deg  AROM:  91 PROM:  95 deg   Knee extension -6  -4 -2 0 Lacking 1.5 deg  0  Ankle dorsiflexion          Ankle plantarflexion          Ankle inversion          Ankle eversion           (Blank rows = not tested)     Gait:  Pt minimally favors R LE with gait and has decreased toe off on R and increased toe out.  Pt has no significant limp. Stairs:  Pt ascended stairs with a reciprocal gait without the rail and descended stairs with a step through gait with bilat rails.   Therapeutic Exercise: Recumbent bike rocking back and forth x 5 mins Supine heel slides with strap with 5 sec hold x 12 reps Seated knee flexion stretch with contralateral LE with 10 sec hold x  5 reps Knee flexion stretch on step  Manual Therapy: Pt received: 4D Patellar mobs f/b grade III PA and PA jt mobs with knee flexed STM to R quad in sitting at EOT with knee flexed. Pt also received rolling of the R quad.  Pt received R knee flexion and extension PROM in supine. Hold relax to improve knee flexion seated at EOT x 2 sets PT assessed ROM    PATIENT EDUCATION:  Education details: Instructed pt to cont with HEP.  symptom management  Person educated: Patient Education method: Explanation, Demonstration, Tactile cues, Verbal cues, and Handouts Education comprehension: verbalized understanding, returned demonstration, verbal cues required, tactile cues required, and needs further education   HOME EXERCISE PROGRAM: Access Code:  [email or web address] URL: [email or web address] Date: 11/03/2021 Prepared by: Carolyne Littles  Exercises - Supine Quad Set  - 5 x daily - 7 x weekly - 3 sets - 10 reps - 4-5 sec hold  hold - Supine Heel Slide with Strap  - 1 x daily - 7 x weekly - 3 sets -  5 reps - 10 sec  hold - Seated Ankle Pumps  - 5 x daily - 7 x weekly - 3 sets - 10 reps  ASSESSMENT:  CLINICAL IMPRESSION: We continue to work on manual therapy at the clinic. She is doing stairs and walking at home. She has been working hard on her range at home and at the gym as well. She is consistently making progress it is just slow and steady. She hit 94 today after strap mobilization.   OBJECTIVE IMPAIRMENTS Abnormal gait, decreased activity tolerance, decreased balance, decreased endurance, decreased mobility, difficulty walking, decreased ROM, decreased strength, impaired perceived functional ability, and pain.   ACTIVITY LIMITATIONS carrying, lifting, bending, sitting, standing, squatting, stairs, transfers, bed mobility, and locomotion level  PARTICIPATION LIMITATIONS: meal prep, cleaning, laundry, driving, shopping, community activity, and yard work  PERSONAL FACTORS 1-2 comorbidities: right RTC repair; prior right knee surgery   are also affecting patient's functional outcome.   REHAB POTENTIAL: Good significant pain   CLINICAL DECISION MAKING: Evolving/moderate complexity pain and swelling limiting her ability to get in an out of the car and bed   EVALUATION COMPLEXITY: Moderate   GOALS: Goals reviewed with patient? Yes  SHORT TERM GOALS: Target date: 12/02/2021  Patient will increase passive flexion to 75 degrees  Baseline: Goal status: achieved 11/15  2. Patient will increase hip flexor and knee extensor strength to 3+/5  Baseline:  Goal status:Improving 11/14   3.  Patient will demonstrate a 2 cm decrease in mid patella edema on the right  Baseline:  Goal status: per visual inspection significant  improvement in swelling    LONG TERM GOALS: Target date: 12/02/2021   Patient will got up/down 6 steps without pain  Baseline:  Goal status: GOAL MET  2.  Patient will bend knee 115 degrees without pain in order to get in and out of the car comfortably  Baseline:  Goal status: INITIAL  3.  Patient will ambulate 2000' with self report of no increase in pain with LRAD Baseline:  Goal status: INITIAL   PLAN: PT FREQUENCY: 2x/week  PT DURATION: 6 weeks  PLANNED INTERVENTIONS: Therapeutic exercises, Therapeutic activity, Neuromuscular re-education, Balance training, Gait training, Patient/Family education, Self Care, Joint mobilization, DME instructions, Aquatic Therapy, Electrical stimulation, Cryotherapy, Moist heat, Taping, Ultrasound, and Manual therapy  PLAN FOR NEXT SESSION: continue to progress exercises as tolerated. Continue with agressive ROM as tolerated.  Cont with manual techniques.  MD is holding off of the manipulation for another week.  He hopes to see a 5-10 deg improvement in flexion.    Carolyne Littles PT DPT  01/05/22 7:23 AM

## 2022-01-07 ENCOUNTER — Ambulatory Visit (HOSPITAL_BASED_OUTPATIENT_CLINIC_OR_DEPARTMENT_OTHER): Payer: Medicare Other | Admitting: Physical Therapy

## 2022-01-07 DIAGNOSIS — R2689 Other abnormalities of gait and mobility: Secondary | ICD-10-CM

## 2022-01-07 DIAGNOSIS — M25661 Stiffness of right knee, not elsewhere classified: Secondary | ICD-10-CM

## 2022-01-07 DIAGNOSIS — M25561 Pain in right knee: Secondary | ICD-10-CM | POA: Diagnosis not present

## 2022-01-07 DIAGNOSIS — R600 Localized edema: Secondary | ICD-10-CM

## 2022-01-07 NOTE — Therapy (Signed)
OUTPATIENT PHYSICAL THERAPY LOWER EXTREMITY Treatment / PROGRESS NOTE   Patient Name: Alicia [name] MRN: [id number] DOB:May 05, 1955, 66 y.o., female Today's Date: 01/06/2022        PT End of Session - 01/05/22 0849     Visit Number 19    Number of Visits 26    Date for PT Re-Evaluation 01/30/22    Authorization Type PN complted on visit 19#    PT Start Time 0845    PT Stop Time 0932    PT Time Calculation (min) 47 min    Activity Tolerance Patient tolerated treatment well    Behavior During Therapy [facility] for tasks assessed/performed               Past Medical History:  Diagnosis Date   Allergy    Cataract    removed bilateral -2014   Chronic tension headaches    Hx of varicella    Hypertension    Kidney stones    Near syncope    Nephrolithiasis    history of  passed on own.    Palpitations    Thyroid disease    pt. denies 06/11/19   Tubular adenoma of colon 02/1997   w/ HGD   Varicose veins    Past Surgical History:  Procedure Laterality Date   ABDOMINAL HYSTERECTOMY     fibroids non cancer    CATARACT EXTRACTION     Bilateral   cataract surgery Right 03/02/2012   CHOLECYSTECTOMY     KNEE SURGERY  04/2020   KNEE SURGERY     ROTATOR CUFF REPAIR Right    TONSILLECTOMY     Patient Active Problem List   Diagnosis Date Noted   Hypokalemia 01/25/2017   Thyroid disease    Palpitations    Nephrolithiasis    Near syncope    Kidney stones    Hypertension    Hx of varicella    Chronic tension headaches    Allergy    Nocturnal headaches 05/04/2016   Coccygodynia 01/29/2015   Atypical chest pain 09/09/2014   Essential hypertension 09/09/2014   Midline low back pain without sciatica 09/05/2013   Renal cyst 09/05/2013   Fatty infiltration of liver 40/98/1191   Umbilical hernia without obstruction and without gangrene 09/05/2013   Flank pain 07/29/2013   Encounter to establish care with new doctor 07/11/2013   Left leg pain 07/11/2013   Cough  01/22/2013   Swelling in head/neck 10/31/2012   Routine health maintenance 04/05/2012   Unspecified vitamin D deficiency 04/04/2012   Varicose veins of lower extremities with other complications 47/82/9562   Chronic venous insufficiency 09/02/2011   Allergic rhinitis, cause unspecified 06/08/2011   Paresthesia of left arm 06/08/2011   Essential hypertension, benign 03/26/2010   OBESITY, CLASS III 10/02/2009   Other and unspecified coagulation defects 04/03/2009   PERIPHERAL EDEMA 10/22/2008   HEMORRHOIDS, INTERNAL, WITH BLEEDING 05/07/2008   NEPHROLITHIASIS, HX OF 04/28/2008   SYNCOPE 04/19/2007   ANXIETY DEPRESSION 10/31/2006   HYSTERECTOMY, VAGINAL, HX OF 10/31/2006    PCP: Dr Shanon Ace   REFERRING PROVIDER: Laqueta Jean PA-C  REFERRING DIAG:  [phone] (ICD-10-CM) - Idiopathic aseptic necrosis of right femur  Right UKA   THERAPY DIAG:  Stiffness of right knee, not elsewhere classified  Localized edema  Other abnormalities of gait and mobility  Acute pain of right knee  Rationale for Evaluation and Treatment Rehabilitation  ONSET DATE: 10/27/2021 DOS  Days since surgery: 10/27/2021   SUBJECTIVE:   SUBJECTIVE  STATEMENT: The patient reports she has been working hard on her stretches She has no pain.     PERTINENT HISTORY: Right RTC repair; Right meniscal repair;   PAIN:  Are you having pain? Yes: NPRS scale: 0/10  Pain location: right lateral knee and posterior knee  Pain description: stiffness   Aggravating factors: Standing , walking, activity and bending  Relieving factors: oxycodone   PRECAUTIONS: None  WEIGHT BEARING RESTRICTIONS Yes WBAT   FALLS:  Has patient fallen in last 6 months? No  LIVING ENVIRONMENT: No steps into her house  OCCUPATION: retried   PLOF: Independent with household mobility with device Was using a cane due to knee collapsing   PATIENT GOALS  To have less pain and to start being able to move again   OBJECTIVE:    DIAGNOSTIC FINDINGS:  Nothing post op   TODAY'S TREATMENT:  PATIENT SURVEYS:  Pt not in FOTO. LEFS:  49/80  LOWER EXTREMITY ROM:  Passive ROM Right eval Left eval 10/20 Right  Right  11/15 Right  Right Right 12/13 Right 12/20  Hip flexion          Hip extension          Hip abduction          Hip adduction          Hip internal rotation          Hip external rotation          Knee flexion 50 with pain and guarding  65 85 90 AROM/PROM:  85-87/90 AROM:  89 PROM:  92 deg  AROM:  91 PROM:  95 deg   Knee extension -6  -4 -2 0 Lacking 1.5 deg  0  Ankle dorsiflexion          Ankle plantarflexion          Ankle inversion          Ankle eversion           (Blank rows = not tested)     Gait:  Pt minimally favors R LE with gait and has decreased toe off on R and increased toe out.  Pt has no significant limp. Stairs:  Pt ascended stairs with a reciprocal gait without the rail and descended stairs with a step through gait with bilat rails.   Today's Treatment:  12/22 Manual: PROM into flexion and extension; trigger point release and STM to posterior knee and IT band;  ; patella heat and creep mobilization  PA and AP grade III and IV mobilization to the knee to improve flexion and extension;  mulligan flexion mobilization with strap     PATIENT EDUCATION:  Education details: Instructed pt to cont with HEP.  symptom management  Person educated: Patient Education method: Explanation, Demonstration, Tactile cues, Verbal cues, and Handouts Education comprehension: verbalized understanding, returned demonstration, verbal cues required, tactile cues required, and needs further education   HOME EXERCISE PROGRAM: Access Code: [email or web address] URL: [email or web address] Date: 11/03/2021 Prepared by: Carolyne Littles   ASSESSMENT:  CLINICAL IMPRESSION: The patients total arc today was 0-98. We continue to work on aggressive mobilization. She will see her MD first week  in January.  OBJECTIVE IMPAIRMENTS Abnormal gait, decreased activity tolerance, decreased balance, decreased endurance, decreased mobility, difficulty walking, decreased ROM, decreased strength, impaired perceived functional ability, and pain.   ACTIVITY LIMITATIONS carrying, lifting, bending, sitting, standing, squatting, stairs, transfers, bed mobility, and locomotion level  PARTICIPATION LIMITATIONS: meal prep, cleaning, laundry, driving, shopping,  community activity, and yard work  PERSONAL FACTORS 1-2 comorbidities: right RTC repair; prior right knee surgery   are also affecting patient's functional outcome.   REHAB POTENTIAL: Good significant pain   CLINICAL DECISION MAKING: Evolving/moderate complexity pain and swelling limiting her ability to get in an out of the car and bed   EVALUATION COMPLEXITY: Moderate   GOALS: Goals reviewed with patient? Yes  SHORT TERM GOALS: Target date: 12/02/2021  Patient will increase passive flexion to 75 degrees  Baseline: Goal status: achieved 11/15  2. Patient will increase hip flexor and knee extensor strength to 3+/5  Baseline:  Goal status:Improving 11/14 ; did not assess on 12/20  3.  Patient will demonstrate a 2 cm decrease in mid patella edema on the right  Baseline:  Goal status: per visual inspection significant improvement in swelling ; did not assess on 12/20   LONG TERM GOALS: Target date: 12/02/2021   Patient will got up/down 6 steps without pain  Baseline:  Goal status: GOAL MET  2.  Patient will bend knee 115 degrees without pain in order to get in and out of the car comfortably  Baseline:  Goal status: ONGOING  3.  Patient will ambulate 2000' with self report of no increase in pain with LRAD Baseline:  Goal status: PROGRESSING   PLAN: PT FREQUENCY: 2x/week  PT DURATION: 6 weeks  PLANNED INTERVENTIONS: Therapeutic exercises, Therapeutic activity, Neuromuscular re-education, Balance training, Gait training,  Patient/Family education, Self Care, Joint mobilization, DME instructions, Aquatic Therapy, Electrical stimulation, Cryotherapy, Moist heat, Taping, Ultrasound, and Manual therapy  PLAN FOR NEXT SESSION: continue to progress exercises as tolerated. Continue with agressive ROM as tolerated.  Cont with manual techniques.  MD is holding off of the manipulation for another week.  He hopes to see a 5-10 deg improvement in flexion.    Carolyne Littles PT DPT  01/06/22 2:06 PM

## 2022-01-12 ENCOUNTER — Ambulatory Visit (HOSPITAL_BASED_OUTPATIENT_CLINIC_OR_DEPARTMENT_OTHER): Payer: Medicare Other | Admitting: Physical Therapy

## 2022-01-12 ENCOUNTER — Encounter (HOSPITAL_BASED_OUTPATIENT_CLINIC_OR_DEPARTMENT_OTHER): Payer: Self-pay | Admitting: Physical Therapy

## 2022-01-12 DIAGNOSIS — R2689 Other abnormalities of gait and mobility: Secondary | ICD-10-CM

## 2022-01-12 DIAGNOSIS — M25561 Pain in right knee: Secondary | ICD-10-CM

## 2022-01-12 DIAGNOSIS — M25661 Stiffness of right knee, not elsewhere classified: Secondary | ICD-10-CM

## 2022-01-12 DIAGNOSIS — R600 Localized edema: Secondary | ICD-10-CM

## 2022-01-12 NOTE — Therapy (Signed)
OUTPATIENT PHYSICAL THERAPY LOWER EXTREMITY Treatment / PROGRESS NOTE   Patient Name: Alicia [name] MRN: [id number] DOB:September 13, 1955, 66 y.o., female Today's Date: 01/12/2022        PT End of Session - 01/12/22 0806     Visit Number 21    Number of Visits 26    Date for PT Re-Evaluation 01/30/22    Authorization Type PN complted on visit 19#    PT Start Time 0804   Patient 4 hours late   PT Stop Time 0845    PT Time Calculation (min) 41 min    Activity Tolerance Patient tolerated treatment well    Behavior During Therapy [facility] for tasks assessed/performed               Past Medical History:  Diagnosis Date   Allergy    Cataract    removed bilateral -2014   Chronic tension headaches    Hx of varicella    Hypertension    Kidney stones    Near syncope    Nephrolithiasis    history of  passed on own.    Palpitations    Thyroid disease    pt. denies 06/11/19   Tubular adenoma of colon 02/1997   w/ HGD   Varicose veins    Past Surgical History:  Procedure Laterality Date   ABDOMINAL HYSTERECTOMY     fibroids non cancer    CATARACT EXTRACTION     Bilateral   cataract surgery Right 03/02/2012   CHOLECYSTECTOMY     KNEE SURGERY  04/2020   KNEE SURGERY     ROTATOR CUFF REPAIR Right    TONSILLECTOMY     Patient Active Problem List   Diagnosis Date Noted   Hypokalemia 01/25/2017   Thyroid disease    Palpitations    Nephrolithiasis    Near syncope    Kidney stones    Hypertension    Hx of varicella    Chronic tension headaches    Allergy    Nocturnal headaches 05/04/2016   Coccygodynia 01/29/2015   Atypical chest pain 09/09/2014   Essential hypertension 09/09/2014   Midline low back pain without sciatica 09/05/2013   Renal cyst 09/05/2013   Fatty infiltration of liver 00/51/1021   Umbilical hernia without obstruction and without gangrene 09/05/2013   Flank pain 07/29/2013   Encounter to establish care with new doctor 07/11/2013   Left leg pain  07/11/2013   Cough 01/22/2013   Swelling in head/neck 10/31/2012   Routine health maintenance 04/05/2012   Unspecified vitamin D deficiency 04/04/2012   Varicose veins of lower extremities with other complications 11/73/5670   Chronic venous insufficiency 09/02/2011   Allergic rhinitis, cause unspecified 06/08/2011   Paresthesia of left arm 06/08/2011   Essential hypertension, benign 03/26/2010   OBESITY, CLASS III 10/02/2009   Other and unspecified coagulation defects 04/03/2009   PERIPHERAL EDEMA 10/22/2008   HEMORRHOIDS, INTERNAL, WITH BLEEDING 05/07/2008   NEPHROLITHIASIS, HX OF 04/28/2008   SYNCOPE 04/19/2007   ANXIETY DEPRESSION 10/31/2006   HYSTERECTOMY, VAGINAL, HX OF 10/31/2006    PCP: Dr Shanon Ace   REFERRING PROVIDER: Laqueta Jean PA-C  REFERRING DIAG:  [phone] (ICD-10-CM) - Idiopathic aseptic necrosis of right femur  Right UKA   THERAPY DIAG:  Stiffness of right knee, not elsewhere classified  Localized edema  Acute pain of right knee  Other abnormalities of gait and mobility  Rationale for Evaluation and Treatment Rehabilitation  ONSET DATE: 10/27/2021 DOS  Days since surgery: 10/27/2021  SUBJECTIVE:   SUBJECTIVE STATEMENT: The patient has no complaints. Sh ehasnt been able to do much over the past few days but she has stretched.    PERTINENT HISTORY: Right RTC repair; Right meniscal repair;   PAIN:  Are you having pain? Yes: NPRS scale: 0/10  Pain location: right lateral knee and posterior knee  Pain description: stiffness   Aggravating factors: Standing , walking, activity and bending  Relieving factors: oxycodone   PRECAUTIONS: None  WEIGHT BEARING RESTRICTIONS Yes WBAT   FALLS:  Has patient fallen in last 6 months? No  LIVING ENVIRONMENT: No steps into her house  OCCUPATION: retried   PLOF: Independent with household mobility with device Was using a cane due to knee collapsing   PATIENT GOALS  To have less pain and to start  being able to move again   OBJECTIVE:   DIAGNOSTIC FINDINGS:  Nothing post op   TODAY'S TREATMENT:  PATIENT SURVEYS:  Pt not in FOTO. LEFS:  49/80  LOWER EXTREMITY ROM:  Passive ROM Right eval Left eval 10/20 Right  Right  11/15 Right  Right Right 12/13 Right 12/20  Hip flexion          Hip extension          Hip abduction          Hip adduction          Hip internal rotation          Hip external rotation          Knee flexion 50 with pain and guarding  65 85 90 AROM/PROM:  85-87/90 AROM:  89 PROM:  92 deg  AROM:  91 PROM:  95 deg   Knee extension -6  -4 -2 0 Lacking 1.5 deg  0  Ankle dorsiflexion          Ankle plantarflexion          Ankle inversion          Ankle eversion           (Blank rows = not tested)     Gait:  Pt minimally favors R LE with gait and has decreased toe off on R and increased toe out.  Pt has no significant limp. Stairs:  Pt ascended stairs with a reciprocal gait without the rail and descended stairs with a step through gait with bilat rails.   Today's Treatment:  12/27 Ex bike 5 min with rocking   Manual: PROM into flexion and extension; trigger point release and STM to posterior knee and IT band;  ; patella heat and creep mobilization  PA and AP grade III and IV mobilization to the knee to improve flexion and extension;  mulligan flexion mobilization with strap   12/22 Manual: PROM into flexion and extension; trigger point release and STM to posterior knee and IT band;  ; patella heat and creep mobilization  PA and AP grade III and IV mobilization to the knee to improve flexion and extension;  mulligan flexion mobilization with strap     PATIENT EDUCATION:  Education details: Instructed pt to cont with HEP.  symptom management  Person educated: Patient Education method: Explanation, Demonstration, Tactile cues, Verbal cues, and Handouts Education comprehension: verbalized understanding, returned demonstration, verbal cues  required, tactile cues required, and needs further education   HOME EXERCISE PROGRAM: Access Code: [email or web address] URL: [email or web address] Date: 11/03/2021 Prepared by: Carolyne Littles   ASSESSMENT:  CLINICAL IMPRESSION: We continue to focus on manual therapy. She  had 100 degrees of flexion today. We will continue to stretch aggressively.   OBJECTIVE IMPAIRMENTS Abnormal gait, decreased activity tolerance, decreased balance, decreased endurance, decreased mobility, difficulty walking, decreased ROM, decreased strength, impaired perceived functional ability, and pain.   ACTIVITY LIMITATIONS carrying, lifting, bending, sitting, standing, squatting, stairs, transfers, bed mobility, and locomotion level  PARTICIPATION LIMITATIONS: meal prep, cleaning, laundry, driving, shopping, community activity, and yard work  PERSONAL FACTORS 1-2 comorbidities: right RTC repair; prior right knee surgery   are also affecting patient's functional outcome.   REHAB POTENTIAL: Good significant pain   CLINICAL DECISION MAKING: Evolving/moderate complexity pain and swelling limiting her ability to get in an out of the car and bed   EVALUATION COMPLEXITY: Moderate   GOALS: Goals reviewed with patient? Yes  SHORT TERM GOALS: Target date: 12/02/2021  Patient will increase passive flexion to 75 degrees  Baseline: Goal status: achieved 11/15  2. Patient will increase hip flexor and knee extensor strength to 3+/5  Baseline:  Goal status:Improving 11/14 ; did not assess on 12/20  3.  Patient will demonstrate a 2 cm decrease in mid patella edema on the right  Baseline:  Goal status: per visual inspection significant improvement in swelling ; did not assess on 12/20   LONG TERM GOALS: Target date: 12/02/2021   Patient will got up/down 6 steps without pain  Baseline:  Goal status: GOAL MET  2.  Patient will bend knee 115 degrees without pain in order to get in and out of the car  comfortably  Baseline:  Goal status: ONGOING  3.  Patient will ambulate 2000' with self report of no increase in pain with LRAD Baseline:  Goal status: PROGRESSING   PLAN: PT FREQUENCY: 2x/week  PT DURATION: 6 weeks  PLANNED INTERVENTIONS: Therapeutic exercises, Therapeutic activity, Neuromuscular re-education, Balance training, Gait training, Patient/Family education, Self Care, Joint mobilization, DME instructions, Aquatic Therapy, Electrical stimulation, Cryotherapy, Moist heat, Taping, Ultrasound, and Manual therapy  PLAN FOR NEXT SESSION: continue to progress exercises as tolerated. Continue with agressive ROM as tolerated.  Cont with manual techniques.  MD is holding off of the manipulation for another week.  He hopes to see a 5-10 deg improvement in flexion.    Carolyne Littles PT DPT  01/12/22 8:29 AM

## 2022-01-14 ENCOUNTER — Ambulatory Visit (HOSPITAL_BASED_OUTPATIENT_CLINIC_OR_DEPARTMENT_OTHER): Payer: Medicare Other | Admitting: Physical Therapy

## 2022-01-14 DIAGNOSIS — R2689 Other abnormalities of gait and mobility: Secondary | ICD-10-CM

## 2022-01-14 DIAGNOSIS — R600 Localized edema: Secondary | ICD-10-CM

## 2022-01-14 DIAGNOSIS — M25661 Stiffness of right knee, not elsewhere classified: Secondary | ICD-10-CM

## 2022-01-14 DIAGNOSIS — M25561 Pain in right knee: Secondary | ICD-10-CM

## 2022-01-14 NOTE — Therapy (Signed)
OUTPATIENT PHYSICAL THERAPY LOWER EXTREMITY Treatment / PROGRESS NOTE   Patient Name: Alicia [name] MRN: [id number] DOB:September 13, 1955, 66 y.o., female Today's Date: 01/12/2022        PT End of Session - 01/12/22 0806     Visit Number 21    Number of Visits 26    Date for PT Re-Evaluation 01/30/22    Authorization Type PN complted on visit 19#    PT Start Time 0804   Patient 4 hours late   PT Stop Time 0845    PT Time Calculation (min) 41 min    Activity Tolerance Patient tolerated treatment well    Behavior During Therapy [facility] for tasks assessed/performed               Past Medical History:  Diagnosis Date   Allergy    Cataract    removed bilateral -2014   Chronic tension headaches    Hx of varicella    Hypertension    Kidney stones    Near syncope    Nephrolithiasis    history of  passed on own.    Palpitations    Thyroid disease    pt. denies 06/11/19   Tubular adenoma of colon 02/1997   w/ HGD   Varicose veins    Past Surgical History:  Procedure Laterality Date   ABDOMINAL HYSTERECTOMY     fibroids non cancer    CATARACT EXTRACTION     Bilateral   cataract surgery Right 03/02/2012   CHOLECYSTECTOMY     KNEE SURGERY  04/2020   KNEE SURGERY     ROTATOR CUFF REPAIR Right    TONSILLECTOMY     Patient Active Problem List   Diagnosis Date Noted   Hypokalemia 01/25/2017   Thyroid disease    Palpitations    Nephrolithiasis    Near syncope    Kidney stones    Hypertension    Hx of varicella    Chronic tension headaches    Allergy    Nocturnal headaches 05/04/2016   Coccygodynia 01/29/2015   Atypical chest pain 09/09/2014   Essential hypertension 09/09/2014   Midline low back pain without sciatica 09/05/2013   Renal cyst 09/05/2013   Fatty infiltration of liver 00/51/1021   Umbilical hernia without obstruction and without gangrene 09/05/2013   Flank pain 07/29/2013   Encounter to establish care with new doctor 07/11/2013   Left leg pain  07/11/2013   Cough 01/22/2013   Swelling in head/neck 10/31/2012   Routine health maintenance 04/05/2012   Unspecified vitamin D deficiency 04/04/2012   Varicose veins of lower extremities with other complications 11/73/5670   Chronic venous insufficiency 09/02/2011   Allergic rhinitis, cause unspecified 06/08/2011   Paresthesia of left arm 06/08/2011   Essential hypertension, benign 03/26/2010   OBESITY, CLASS III 10/02/2009   Other and unspecified coagulation defects 04/03/2009   PERIPHERAL EDEMA 10/22/2008   HEMORRHOIDS, INTERNAL, WITH BLEEDING 05/07/2008   NEPHROLITHIASIS, HX OF 04/28/2008   SYNCOPE 04/19/2007   ANXIETY DEPRESSION 10/31/2006   HYSTERECTOMY, VAGINAL, HX OF 10/31/2006    PCP: Dr Shanon Ace   REFERRING PROVIDER: Laqueta Jean PA-C  REFERRING DIAG:  [phone] (ICD-10-CM) - Idiopathic aseptic necrosis of right femur  Right UKA   THERAPY DIAG:  Stiffness of right knee, not elsewhere classified  Localized edema  Acute pain of right knee  Other abnormalities of gait and mobility  Rationale for Evaluation and Treatment Rehabilitation  ONSET DATE: 10/27/2021 DOS  Days since surgery: 10/27/2021  SUBJECTIVE:   SUBJECTIVE STATEMENT: The patient continues to have no complaints.  PERTINENT HISTORY: Right RTC repair; Right meniscal repair;   PAIN:  Are you having pain? Yes: NPRS scale: 0/10  Pain location: right lateral knee and posterior knee  Pain description: stiffness   Aggravating factors: Standing , walking, activity and bending  Relieving factors: oxycodone   PRECAUTIONS: None  WEIGHT BEARING RESTRICTIONS Yes WBAT   FALLS:  Has patient fallen in last 6 months? No  LIVING ENVIRONMENT: No steps into her house  OCCUPATION: retried   PLOF: Independent with household mobility with device Was using a cane due to knee collapsing   PATIENT GOALS  To have less pain and to start being able to move again   OBJECTIVE:   DIAGNOSTIC FINDINGS:   Nothing post op   TODAY'S TREATMENT:  PATIENT SURVEYS:  Pt not in FOTO. LEFS:  49/80  LOWER EXTREMITY ROM:  Passive ROM Right eval Left eval 10/20 Right  Right  11/15 Right  Right Right 12/13 Right 12/20  Hip flexion          Hip extension          Hip abduction          Hip adduction          Hip internal rotation          Hip external rotation          Knee flexion 50 with pain and guarding  65 85 90 AROM/PROM:  85-87/90 AROM:  89 PROM:  92 deg  AROM:  91 PROM:  95 deg   Knee extension -6  -4 -2 0 Lacking 1.5 deg  0  Ankle dorsiflexion          Ankle plantarflexion          Ankle inversion          Ankle eversion           (Blank rows = not tested)     Gait:  Pt minimally favors R LE with gait and has decreased toe off on R and increased toe out.  Pt has no significant limp. Stairs:  Pt ascended stairs with a reciprocal gait without the rail and descended stairs with a step through gait with bilat rails.   Today's Treatment:  12/29 Ex bike 5 min with rocking   Manual: PROM into flexion and extension; trigger point release and STM to posterior knee and IT band;  ; patella heat and creep mobilization  PA and AP grade III and IV mobilization to the knee to improve flexion and extension;  mulligan flexion mobilization with strap 12/27 Ex bike 5 min with rocking   Manual: PROM into flexion and extension; trigger point release and STM to posterior knee and IT band;  ; patella heat and creep mobilization  PA and AP grade III and IV mobilization to the knee to improve flexion and extension;  mulligan flexion mobilization with strap   12/22 Manual: PROM into flexion and extension; trigger point release and STM to posterior knee and IT band;  ; patella heat and creep mobilization  PA and AP grade III and IV mobilization to the knee to improve flexion and extension;  mulligan flexion mobilization with strap     PATIENT EDUCATION:  Education details: Instructed pt  to cont with HEP.  symptom management  Person educated: Patient Education method: Explanation, Demonstration, Tactile cues, Verbal cues, and Handouts Education comprehension: verbalized understanding, returned demonstration,  verbal cues required, tactile cues required, and needs further education   HOME EXERCISE PROGRAM: Access Code: [email or web address] URL: [email or web address] Date: 11/03/2021 Prepared by: Carolyne Littles   ASSESSMENT:  CLINICAL IMPRESSION: The patient makes consistent gains every time she comes. Her range today was measured at 0-103 after manual therapy. She continues to come to the gym and wor on strengthening on her own. We will continue with therapy pending MD's recommendation.   OBJECTIVE IMPAIRMENTS Abnormal gait, decreased activity tolerance, decreased balance, decreased endurance, decreased mobility, difficulty walking, decreased ROM, decreased strength, impaired perceived functional ability, and pain.   ACTIVITY LIMITATIONS carrying, lifting, bending, sitting, standing, squatting, stairs, transfers, bed mobility, and locomotion level  PARTICIPATION LIMITATIONS: meal prep, cleaning, laundry, driving, shopping, community activity, and yard work  PERSONAL FACTORS 1-2 comorbidities: right RTC repair; prior right knee surgery   are also affecting patient's functional outcome.   REHAB POTENTIAL: Good significant pain   CLINICAL DECISION MAKING: Evolving/moderate complexity pain and swelling limiting her ability to get in an out of the car and bed   EVALUATION COMPLEXITY: Moderate   GOALS: Goals reviewed with patient? Yes  SHORT TERM GOALS: Target date: 12/02/2021  Patient will increase passive flexion to 75 degrees  Baseline: Goal status: achieved 11/15  2. Patient will increase hip flexor and knee extensor strength to 3+/5  Baseline:  Goal status:Improving 11/14 ; did not assess on 12/20  3.  Patient will demonstrate a 2 cm decrease in mid patella  edema on the right  Baseline:  Goal status: per visual inspection significant improvement in swelling ; did not assess on 12/20   LONG TERM GOALS: Target date: 12/02/2021   Patient will got up/down 6 steps without pain  Baseline:  Goal status: GOAL MET  2.  Patient will bend knee 115 degrees without pain in order to get in and out of the car comfortably  Baseline:  Goal status: ONGOING  3.  Patient will ambulate 2000' with self report of no increase in pain with LRAD Baseline:  Goal status: PROGRESSING   PLAN: PT FREQUENCY: 2x/week  PT DURATION: 6 weeks  PLANNED INTERVENTIONS: Therapeutic exercises, Therapeutic activity, Neuromuscular re-education, Balance training, Gait training, Patient/Family education, Self Care, Joint mobilization, DME instructions, Aquatic Therapy, Electrical stimulation, Cryotherapy, Moist heat, Taping, Ultrasound, and Manual therapy  PLAN FOR NEXT SESSION: continue to progress exercises as tolerated. Continue with agressive ROM as tolerated.  Cont with manual techniques.  MD is holding off of the manipulation for another week.  He hopes to see a 5-10 deg improvement in flexion.    Carolyne Littles PT DPT  01/12/22 8:29 AM

## 2022-01-18 ENCOUNTER — Encounter (HOSPITAL_BASED_OUTPATIENT_CLINIC_OR_DEPARTMENT_OTHER): Payer: Self-pay | Admitting: Physical Therapy

## 2022-01-18 ENCOUNTER — Ambulatory Visit (HOSPITAL_BASED_OUTPATIENT_CLINIC_OR_DEPARTMENT_OTHER): Payer: Medicare Other | Attending: Rehabilitation | Admitting: Physical Therapy

## 2022-01-18 DIAGNOSIS — M25661 Stiffness of right knee, not elsewhere classified: Secondary | ICD-10-CM | POA: Insufficient documentation

## 2022-01-18 DIAGNOSIS — R2689 Other abnormalities of gait and mobility: Secondary | ICD-10-CM | POA: Diagnosis present

## 2022-01-18 DIAGNOSIS — R600 Localized edema: Secondary | ICD-10-CM | POA: Insufficient documentation

## 2022-01-18 DIAGNOSIS — M25561 Pain in right knee: Secondary | ICD-10-CM | POA: Insufficient documentation

## 2022-01-18 NOTE — Therapy (Signed)
OUTPATIENT PHYSICAL THERAPY LOWER EXTREMITY Treatment / PROGRESS NOTE   Patient Name: Alicia [name] MRN: [id number] DOB:Feb 23, 1955, 67 y.o., female Today's Date: 01/18/2022        PT End of Session - 01/18/22 1418     Visit Number 23    Number of Visits 26    Date for PT Re-Evaluation 01/30/22    Authorization Type PN complted on visit 19#    PT Start Time 1301    PT Stop Time 1342    PT Time Calculation (min) 41 min    Activity Tolerance Patient tolerated treatment well    Behavior During Therapy WFL for tasks assessed/performed                Past Medical History:  Diagnosis Date   Allergy    Cataract    removed bilateral -2014   Chronic tension headaches    Hx of varicella    Hypertension    Kidney stones    Near syncope    Nephrolithiasis    history of  passed on own.    Palpitations    Thyroid disease    pt. denies 06/11/19   Tubular adenoma of colon 02/1997   w/ HGD   Varicose veins    Past Surgical History:  Procedure Laterality Date   ABDOMINAL HYSTERECTOMY     fibroids non cancer    CATARACT EXTRACTION     Bilateral   cataract surgery Right 03/02/2012   CHOLECYSTECTOMY     KNEE SURGERY  04/2020   KNEE SURGERY     ROTATOR CUFF REPAIR Right    TONSILLECTOMY     Patient Active Problem List   Diagnosis Date Noted   Hypokalemia 01/25/2017   Thyroid disease    Palpitations    Nephrolithiasis    Near syncope    Kidney stones    Hypertension    Hx of varicella    Chronic tension headaches    Allergy    Nocturnal headaches 05/04/2016   Coccygodynia 01/29/2015   Atypical chest pain 09/09/2014   Essential hypertension 09/09/2014   Midline low back pain without sciatica 09/05/2013   Renal cyst 09/05/2013   Fatty infiltration of liver 69/67/8938   Umbilical hernia without obstruction and without gangrene 09/05/2013   Flank pain 07/29/2013   Encounter to establish care with new doctor 07/11/2013   Left leg pain 07/11/2013   Cough  01/22/2013   Swelling in head/neck 10/31/2012   Routine health maintenance 04/05/2012   Unspecified vitamin D deficiency 04/04/2012   Varicose veins of lower extremities with other complications 11/02/5100   Chronic venous insufficiency 09/02/2011   Allergic rhinitis, cause unspecified 06/08/2011   Paresthesia of left arm 06/08/2011   Essential hypertension, benign 03/26/2010   OBESITY, CLASS III 10/02/2009   Other and unspecified coagulation defects 04/03/2009   PERIPHERAL EDEMA 10/22/2008   HEMORRHOIDS, INTERNAL, WITH BLEEDING 05/07/2008   NEPHROLITHIASIS, HX OF 04/28/2008   SYNCOPE 04/19/2007   ANXIETY DEPRESSION 10/31/2006   HYSTERECTOMY, VAGINAL, HX OF 10/31/2006    PCP: Dr Shanon Ace   REFERRING PROVIDER: Laqueta Jean PA-C  REFERRING DIAG:  [phone] (ICD-10-CM) - Idiopathic aseptic necrosis of right femur  Right UKA   THERAPY DIAG:  Stiffness of right knee, not elsewhere classified  Localized edema  Acute pain of right knee  Other abnormalities of gait and mobility  Rationale for Evaluation and Treatment Rehabilitation  ONSET DATE: 10/27/2021 DOS  Days since surgery: 10/27/2021   SUBJECTIVE:  SUBJECTIVE STATEMENT: The patient continues to have no complaints.  PERTINENT HISTORY: Right RTC repair; Right meniscal repair;   PAIN:  Are you having pain? Yes: NPRS scale: 0/10  Pain location: right lateral knee and posterior knee  Pain description: stiffness   Aggravating factors: Standing , walking, activity and bending  Relieving factors: oxycodone   PRECAUTIONS: None  WEIGHT BEARING RESTRICTIONS Yes WBAT   FALLS:  Has patient fallen in last 6 months? No  LIVING ENVIRONMENT: No steps into her house  OCCUPATION: retried   PLOF: Independent with household mobility with device Was using a cane due to knee collapsing   PATIENT GOALS  To have less pain and to start being able to move again   OBJECTIVE:   DIAGNOSTIC FINDINGS:  Nothing post op    TODAY'S TREATMENT:  PATIENT SURVEYS:  Pt not in FOTO. LEFS:  49/80  LOWER EXTREMITY ROM:  Passive ROM Right eval Left eval 10/20 Right  Right  11/15 Right  Right Right 12/13 Right 12/20  Hip flexion          Hip extension          Hip abduction          Hip adduction          Hip internal rotation          Hip external rotation          Knee flexion 50 with pain and guarding  65 85 90 AROM/PROM:  85-87/90 AROM:  89 PROM:  92 deg  AROM:  91 PROM:  95 deg   Knee extension -6  -4 -2 0 Lacking 1.5 deg  0  Ankle dorsiflexion          Ankle plantarflexion          Ankle inversion          Ankle eversion           (Blank rows = not tested)     Gait:  Pt minimally favors R LE with gait and has decreased toe off on R and increased toe out.  Pt has no significant limp. Stairs:  Pt ascended stairs with a reciprocal gait without the rail and descended stairs with a step through gait with bilat rails.   Today's Treatment:  Ex bike 5 min with rocking   Manual: PROM into flexion and extension; trigger point release and STM to posterior knee and IT band;  ; patella heat and creep mobilization  PA and AP grade III and IV mobilization to the knee to improve flexion and extension;  mulligan flexion mobilization with strap    12/29 Ex bike 5 min with rocking   Manual: PROM into flexion and extension; trigger point release and STM to posterior knee and IT band;  ; patella heat and creep mobilization  PA and AP grade III and IV mobilization to the knee to improve flexion and extension;  mulligan flexion mobilization with strap 12/27 Ex bike 5 min with rocking   Manual: PROM into flexion and extension; trigger point release and STM to posterior knee and IT band;  ; patella heat and creep mobilization  PA and AP grade III and IV mobilization to the knee to improve flexion and extension;  mulligan flexion mobilization with strap   12/22 Manual: PROM into flexion and extension;  trigger point release and STM to posterior knee and IT band;  ; patella heat and creep mobilization  PA and AP grade III  and IV mobilization to the knee to improve flexion and extension;  mulligan flexion mobilization with strap     PATIENT EDUCATION:  Education details: Instructed pt to cont with HEP.  symptom management  Person educated: Patient Education method: Explanation, Demonstration, Tactile cues, Verbal cues, and Handouts Education comprehension: verbalized understanding, returned demonstration, verbal cues required, tactile cues required, and needs further education   HOME EXERCISE PROGRAM: Access Code: [email or web address] URL: [email or web address] Date: 11/03/2021 Prepared by: Carolyne Littles   ASSESSMENT:  CLINICAL IMPRESSION: The patients ROM continues to improve. Her total arc today was 0-105 after manual therapy. She will see her MD tomorrow. If she disn't require a manipulation we will continue to work on ROM as tolerated. We will review strengthening as well if she is not having a manip, but she is walking without difficulty. We will proceed per MD  OBJECTIVE IMPAIRMENTS Abnormal gait, decreased activity tolerance, decreased balance, decreased endurance, decreased mobility, difficulty walking, decreased ROM, decreased strength, impaired perceived functional ability, and pain.   ACTIVITY LIMITATIONS carrying, lifting, bending, sitting, standing, squatting, stairs, transfers, bed mobility, and locomotion level  PARTICIPATION LIMITATIONS: meal prep, cleaning, laundry, driving, shopping, community activity, and yard work  PERSONAL FACTORS 1-2 comorbidities: right RTC repair; prior right knee surgery   are also affecting patient's functional outcome.   REHAB POTENTIAL: Good significant pain   CLINICAL DECISION MAKING: Evolving/moderate complexity pain and swelling limiting her ability to get in an out of the car and bed   EVALUATION COMPLEXITY:  Moderate   GOALS: Goals reviewed with patient? Yes  SHORT TERM GOALS: Target date: 12/02/2021  Patient will increase passive flexion to 75 degrees  Baseline: Goal status: achieved 11/15  2. Patient will increase hip flexor and knee extensor strength to 3+/5  Baseline:  Goal status:Improving 11/14 ; did not assess on 12/20  3.  Patient will demonstrate a 2 cm decrease in mid patella edema on the right  Baseline:  Goal status: per visual inspection significant improvement in swelling ; did not assess on 12/20   LONG TERM GOALS: Target date: 12/02/2021   Patient will got up/down 6 steps without pain  Baseline:  Goal status: GOAL MET  2.  Patient will bend knee 115 degrees without pain in order to get in and out of the car comfortably  Baseline:  Goal status: ONGOING  3.  Patient will ambulate 2000' with self report of no increase in pain with LRAD Baseline:  Goal status: PROGRESSING   PLAN: PT FREQUENCY: 2x/week  PT DURATION: 6 weeks  PLANNED INTERVENTIONS: Therapeutic exercises, Therapeutic activity, Neuromuscular re-education, Balance training, Gait training, Patient/Family education, Self Care, Joint mobilization, DME instructions, Aquatic Therapy, Electrical stimulation, Cryotherapy, Moist heat, Taping, Ultrasound, and Manual therapy  PLAN FOR NEXT SESSION: continue to progress exercises as tolerated. Continue with agressive ROM as tolerated.  Cont with manual techniques.  MD is holding off of the manipulation for another week.  He hopes to see a 5-10 deg improvement in flexion.    Carolyne Littles PT DPT  01/18/22 2:22 PM

## 2022-01-25 ENCOUNTER — Encounter (HOSPITAL_BASED_OUTPATIENT_CLINIC_OR_DEPARTMENT_OTHER): Payer: Medicare Other | Admitting: Physical Therapy

## 2022-01-25 ENCOUNTER — Ambulatory Visit (HOSPITAL_BASED_OUTPATIENT_CLINIC_OR_DEPARTMENT_OTHER): Payer: Medicare Other | Admitting: Physical Therapy

## 2022-01-26 ENCOUNTER — Encounter (HOSPITAL_BASED_OUTPATIENT_CLINIC_OR_DEPARTMENT_OTHER): Payer: Medicare Other | Admitting: Physical Therapy

## 2022-01-28 ENCOUNTER — Encounter (HOSPITAL_BASED_OUTPATIENT_CLINIC_OR_DEPARTMENT_OTHER): Payer: Self-pay | Admitting: Physical Therapy

## 2022-01-28 ENCOUNTER — Ambulatory Visit (HOSPITAL_BASED_OUTPATIENT_CLINIC_OR_DEPARTMENT_OTHER): Payer: Medicare Other | Admitting: Physical Therapy

## 2022-01-28 DIAGNOSIS — R600 Localized edema: Secondary | ICD-10-CM

## 2022-01-28 DIAGNOSIS — M25561 Pain in right knee: Secondary | ICD-10-CM

## 2022-01-28 DIAGNOSIS — M25661 Stiffness of right knee, not elsewhere classified: Secondary | ICD-10-CM | POA: Diagnosis not present

## 2022-01-28 DIAGNOSIS — R2689 Other abnormalities of gait and mobility: Secondary | ICD-10-CM

## 2022-01-28 NOTE — Therapy (Unsigned)
OUTPATIENT PHYSICAL THERAPY LOWER EXTREMITY Treatment / PROGRESS NOTE   Patient Name: Alicia [name] MRN: [id number] DOB:1955/08/22, 67 y.o., female Today's Date: 01/28/2022   Progress Note Reporting Period 01/06/2023 to 01/28/2022  See note below for Objective Data and Assessment of Progress/Goals.         PT End of Session - 01/30/22 0947     Visit Number 24    Number of Visits 30    Date for PT Re-Evaluation 03/13/22    Authorization Type PN  completed at 24    PT Start Time 1300    PT Stop Time 1343    PT Time Calculation (min) 43 min    Activity Tolerance Patient tolerated treatment well    Behavior During Therapy WFL for tasks assessed/performed                Past Medical History:  Diagnosis Date   Allergy    Cataract    removed bilateral -2014   Chronic tension headaches    Hx of varicella    Hypertension    Kidney stones    Near syncope    Nephrolithiasis    history of  passed on own.    Palpitations    Thyroid disease    pt. denies 06/11/19   Tubular adenoma of colon 02/1997   w/ HGD   Varicose veins    Past Surgical History:  Procedure Laterality Date   ABDOMINAL HYSTERECTOMY     fibroids non cancer    CATARACT EXTRACTION     Bilateral   cataract surgery Right 03/02/2012   CHOLECYSTECTOMY     KNEE SURGERY  04/2020   KNEE SURGERY     ROTATOR CUFF REPAIR Right    TONSILLECTOMY     Patient Active Problem List   Diagnosis Date Noted   Hypokalemia 01/25/2017   Thyroid disease    Palpitations    Nephrolithiasis    Near syncope    Kidney stones    Hypertension    Hx of varicella    Chronic tension headaches    Allergy    Nocturnal headaches 05/04/2016   Coccygodynia 01/29/2015   Atypical chest pain 09/09/2014   Essential hypertension 09/09/2014   Midline low back pain without sciatica 09/05/2013   Renal cyst 09/05/2013   Fatty infiltration of liver 22/29/7989   Umbilical hernia without obstruction and without gangrene  09/05/2013   Flank pain 07/29/2013   Encounter to establish care with new doctor 07/11/2013   Left leg pain 07/11/2013   Cough 01/22/2013   Swelling in head/neck 10/31/2012   Routine health maintenance 04/05/2012   Unspecified vitamin D deficiency 04/04/2012   Varicose veins of lower extremities with other complications 21/19/4174   Chronic venous insufficiency 09/02/2011   Allergic rhinitis, cause unspecified 06/08/2011   Paresthesia of left arm 06/08/2011   Essential hypertension, benign 03/26/2010   OBESITY, CLASS III 10/02/2009   Other and unspecified coagulation defects 04/03/2009   PERIPHERAL EDEMA 10/22/2008   HEMORRHOIDS, INTERNAL, WITH BLEEDING 05/07/2008   NEPHROLITHIASIS, HX OF 04/28/2008   SYNCOPE 04/19/2007   ANXIETY DEPRESSION 10/31/2006   HYSTERECTOMY, VAGINAL, HX OF 10/31/2006    PCP: Dr Shanon Ace   REFERRING PROVIDER: Laqueta Jean PA-C  REFERRING DIAG:  [id number] (ICD-10-CM) - Idiopathic aseptic necrosis of right femur  Right UKA   THERAPY DIAG:  Stiffness of right knee, not elsewhere classified  Localized edema  Acute pain of right knee  Other abnormalities of gait and mobility  Rationale for Evaluation and Treatment Rehabilitation  ONSET DATE: 10/27/2021 DOS  Days since surgery: 10/27/2021   SUBJECTIVE:   SUBJECTIVE STATEMENT: The patient does not have to do a manipulation. She has had the flu.  She has not been able to stretch as much as she had been  PERTINENT HISTORY: Right RTC repair; Right meniscal repair;   PAIN:  Are you having pain? Yes: NPRS scale: 0/10  Pain location: right lateral knee and posterior knee  Pain description: stiffness   Aggravating factors: Standing , walking, activity and bending  Relieving factors: oxycodone   PRECAUTIONS: None  WEIGHT BEARING RESTRICTIONS Yes WBAT   FALLS:  Has patient fallen in last 6 months? No  LIVING ENVIRONMENT: No steps into her house  OCCUPATION: retried   PLOF: Independent  with household mobility with device Was using a cane due to knee collapsing   PATIENT GOALS  To have less pain and to start being able to move again   OBJECTIVE:   DIAGNOSTIC FINDINGS:  Nothing post op   TODAY'S TREATMENT:  PATIENT SURVEYS:  Pt not in FOTO. LEFS:  49/80  LOWER EXTREMITY ROM:  Passive ROM Right eval Left eval 10/20 Right  Right  11/15 Right  Right Right 12/13 Right 1/12  Hip flexion          Hip extension          Hip abduction          Hip adduction          Hip internal rotation          Hip external rotation          Knee flexion 50 with pain and guarding  65 85 90 AROM/PROM:  85-87/90 AROM:  89 PROM:  92 deg   PROM:  105 deg   Knee extension -6  -4 -2 0 Lacking 1.5 deg  0  Ankle dorsiflexion          Ankle plantarflexion          Ankle inversion          Ankle eversion           (Blank rows = not tested)   LOWER EXTREMITY MMT:    MMT Right 01/28/22 Left 01/28/22  Hip flexion 33.6 31.4  Hip extension    Hip abduction 48.4 30.6  Hip adduction    Hip internal rotation    Hip external rotation    Knee flexion    Knee extension 32.2 32.6  Ankle dorsiflexion    Ankle plantarflexion    Ankle inversion    Ankle eversion     (Blank rows = not tested)   Gait:  Pt minimally favors R LE with gait and has decreased toe off on R and increased toe out.  Pt has no significant limp. Stairs:  Pt ascended stairs with a reciprocal gait without the rail and descended stairs with a step through gait with bilat rails.   Today's Treatment:  Ex bike 5 min with rocking   Manual: PROM into flexion and extension; trigger point release and STM to posterior knee and IT band;  ; patella heat and creep mobilization  PA and AP grade III and IV mobilization to the knee to improve flexion and extension;  mulligan flexion mobilization with strap  Straight leg raise 3 x 10 1.5 pound weight LAQ 3 x 10 3 pound weight  Reviewed gym equipment: [employer] leg press  25 pounds 3 x  10 Cybex leg press 50 pounds 3 x 10 Reviewed the differences and set up between the 2 machines  Cybex knee extension 3 x 10 10 pounds Patient advised that it may be early for this exercise but she had no pain felt like it gave her good quad work  Hip abduction machine 50 pounds 3 x 15 reviewed set up and use    12/29 Ex bike 5 min with rocking   Manual: PROM into flexion and extension; trigger point release and STM to posterior knee and IT band;  ; patella heat and creep mobilization  PA and AP grade III and IV mobilization to the knee to improve flexion and extension;  mulligan flexion mobilization with strap 12/27 Ex bike 5 min with rocking   Manual: PROM into flexion and extension; trigger point release and STM to posterior knee and IT band;  ; patella heat and creep mobilization  PA and AP grade III and IV mobilization to the knee to improve flexion and extension;  mulligan flexion mobilization with strap   12/22 Manual: PROM into flexion and extension; trigger point release and STM to posterior knee and IT band;  ; patella heat and creep mobilization  PA and AP grade III and IV mobilization to the knee to improve flexion and extension;  mulligan flexion mobilization with strap     PATIENT EDUCATION:  Education details: Instructed pt to cont with HEP.  symptom management  Person educated: Patient Education method: Explanation, Demonstration, Tactile cues, Verbal cues, and Handouts Education comprehension: verbalized understanding, returned demonstration, verbal cues required, tactile cues required, and needs further education   HOME EXERCISE PROGRAM: Access Code: [email or web address] URL: [email or web address] Date: 11/03/2021 Prepared by: Carolyne Littles   ASSESSMENT:  CLINICAL IMPRESSION: Overall the patient continues to make great progress her range of motion continues to progress despite not being able to stretch as much over the last few weeks,  she continues to consistently gain range of motion.  Therapy performed initial formal strength test today finding more strength on surgical side with certain muscle groups.  We reviewed return to gym exercises as she is a member of our gym.  She tolerated well with no increase in pain.  She would benefit from further skilled therapy 1 week 6 to finalize her final HEP and hopefully obtain between 110 and 120 degrees of total knee flexion. OBJECTIVE IMPAIRMENTS Abnormal gait, decreased activity tolerance, decreased balance, decreased endurance, decreased mobility, difficulty walking, decreased ROM, decreased strength, impaired perceived functional ability, and pain.   ACTIVITY LIMITATIONS carrying, lifting, bending, sitting, standing, squatting, stairs, transfers, bed mobility, and locomotion level  PARTICIPATION LIMITATIONS: meal prep, cleaning, laundry, driving, shopping, community activity, and yard work  PERSONAL FACTORS 1-2 comorbidities: right RTC repair; prior right knee surgery   are also affecting patient's functional outcome.   REHAB POTENTIAL: Good significant pain   CLINICAL DECISION MAKING: Evolving/moderate complexity pain and swelling limiting her ability to get in an out of the car and bed   EVALUATION COMPLEXITY: Moderate   GOALS: Goals reviewed with patient? Yes  SHORT TERM GOALS: Target date: 12/02/2021  Patient will increase passive flexion to 75 degrees  Baseline: Goal status: achieved 11/15  2. Patient will increase hip flexor and knee extensor strength to 3+/5  Baseline:  Goal status:Improving 11/14 ; did not assess on 12/20  3.  Patient will demonstrate a 2 cm decrease in mid patella edema on the right  Baseline:  Goal status: per visual inspection significant improvement  in swelling ; did not assess on 12/20   LONG TERM GOALS: Target date: 12/02/2021   Patient will got up/down 6 steps without pain  Baseline:  Goal status: GOAL MET  2.  Patient will bend  knee 115 degrees without pain in order to get in and out of the car comfortably  Baseline:  Goal status: ONGOING  3.  Patient will ambulate 2000' with self report of no increase in pain with LRAD Baseline:  Goal status: PROGRESSING   PLAN: PT FREQUENCY: 2x/week  PT DURATION: 6 weeks  PLANNED INTERVENTIONS: Therapeutic exercises, Therapeutic activity, Neuromuscular re-education, Balance training, Gait training, Patient/Family education, Self Care, Joint mobilization, DME instructions, Aquatic Therapy, Electrical stimulation, Cryotherapy, Moist heat, Taping, Ultrasound, and Manual therapy  PLAN FOR NEXT SESSION: continue to progress exercises as tolerated. Continue with agressive ROM as tolerated.  Cont with manual techniques.  MD is holding off of the manipulation for another week.  He hopes to see a 5-10 deg improvement in flexion.    Carolyne Littles PT DPT  01/30/22 9:52 AM

## 2022-02-02 ENCOUNTER — Encounter (HOSPITAL_BASED_OUTPATIENT_CLINIC_OR_DEPARTMENT_OTHER): Payer: Medicare Other | Admitting: Physical Therapy

## 2022-02-04 ENCOUNTER — Ambulatory Visit (HOSPITAL_BASED_OUTPATIENT_CLINIC_OR_DEPARTMENT_OTHER): Payer: Medicare Other

## 2022-02-10 ENCOUNTER — Encounter (HOSPITAL_BASED_OUTPATIENT_CLINIC_OR_DEPARTMENT_OTHER): Payer: Medicare Other | Admitting: Physical Therapy

## 2022-02-15 ENCOUNTER — Encounter (HOSPITAL_BASED_OUTPATIENT_CLINIC_OR_DEPARTMENT_OTHER): Payer: Self-pay | Admitting: Physical Therapy

## 2022-02-15 ENCOUNTER — Ambulatory Visit (HOSPITAL_BASED_OUTPATIENT_CLINIC_OR_DEPARTMENT_OTHER): Payer: Medicare Other | Admitting: Physical Therapy

## 2022-02-15 DIAGNOSIS — M25561 Pain in right knee: Secondary | ICD-10-CM

## 2022-02-15 DIAGNOSIS — R2689 Other abnormalities of gait and mobility: Secondary | ICD-10-CM

## 2022-02-15 DIAGNOSIS — M25661 Stiffness of right knee, not elsewhere classified: Secondary | ICD-10-CM

## 2022-02-15 DIAGNOSIS — R600 Localized edema: Secondary | ICD-10-CM

## 2022-02-15 NOTE — Therapy (Addendum)
 OUTPATIENT PHYSICAL THERAPY LOWER EXTREMITY Treatment /discharge    Patient Name: Alicia [name] MRN: [id number] DOB:April 18, 1955, 67 y.o., female Today's Date: 01/28/2022   Progress Note Reporting Period 01/06/2023 to 01/28/2022  See note below for Objective Data and Assessment of Progress/Goals.         PT End of Session - 01/30/22 0947     Visit Number 24    Number of Visits 30    Date for PT Re-Evaluation 03/13/22    Authorization Type PN  completed at 24    PT Start Time 1300    PT Stop Time 1343    PT Time Calculation (min) 43 min    Activity Tolerance Patient tolerated treatment well    Behavior During Therapy WFL for tasks assessed/performed                Past Medical History:  Diagnosis Date   Allergy    Cataract    removed bilateral -2014   Chronic tension headaches    Hx of varicella    Hypertension    Kidney stones    Near syncope    Nephrolithiasis    history of  passed on own.    Palpitations    Thyroid  disease    pt. denies 06/11/19   Tubular adenoma of colon 02/1997   w/ HGD   Varicose veins    Past Surgical History:  Procedure Laterality Date   ABDOMINAL HYSTERECTOMY     fibroids non cancer    CATARACT EXTRACTION     Bilateral   cataract surgery Right 03/02/2012   CHOLECYSTECTOMY     KNEE SURGERY  04/2020   KNEE SURGERY     ROTATOR CUFF REPAIR Right    TONSILLECTOMY     Patient Active Problem List   Diagnosis Date Noted   Hypokalemia 01/25/2017   Thyroid  disease    Palpitations    Nephrolithiasis    Near syncope    Kidney stones    Hypertension    Hx of varicella    Chronic tension headaches    Allergy    Nocturnal headaches 05/04/2016   Coccygodynia 01/29/2015   Atypical chest pain 09/09/2014   Essential hypertension 09/09/2014   Midline low back pain without sciatica 09/05/2013   Renal cyst 09/05/2013   Fatty infiltration of liver 09/05/2013   Umbilical hernia without obstruction and without gangrene  09/05/2013   Flank pain 07/29/2013   Encounter to establish care with new doctor 07/11/2013   Left leg pain 07/11/2013   Cough 01/22/2013   Swelling in head/neck 10/31/2012   Routine health maintenance 04/05/2012   Unspecified vitamin D  deficiency 04/04/2012   Varicose veins of lower extremities with other complications 09/02/2011   Chronic venous insufficiency 09/02/2011   Allergic rhinitis, cause unspecified 06/08/2011   Paresthesia of left arm 06/08/2011   Essential hypertension, benign 03/26/2010   OBESITY, CLASS III 10/02/2009   Other and unspecified coagulation defects 04/03/2009   PERIPHERAL EDEMA 10/22/2008   HEMORRHOIDS, INTERNAL, WITH BLEEDING 05/07/2008   NEPHROLITHIASIS, HX OF 04/28/2008   SYNCOPE 04/19/2007   ANXIETY DEPRESSION 10/31/2006   HYSTERECTOMY, VAGINAL, HX OF 10/31/2006    PCP: Dr Daphine Eagle   REFERRING PROVIDER: Derl Flemings PA-C  REFERRING DIAG:  [phone] (ICD-10-CM) - Idiopathic aseptic necrosis of right femur  Right UKA   THERAPY DIAG:  Stiffness of right knee, not elsewhere classified  Localized edema  Acute pain of right knee  Other abnormalities of gait and mobility  Rationale  for Evaluation and Treatment Rehabilitation  ONSET DATE: 10/27/2021 DOS  Days since surgery: 10/27/2021   SUBJECTIVE:   SUBJECTIVE STATEMENT: The patient has had Covid for 2 weeks. She has not been able to do much on her exercise program.  PERTINENT HISTORY: Right RTC repair; Right meniscal repair;   PAIN:  Are you having pain? Yes: NPRS scale: 0/10  Pain location: right lateral knee and posterior knee  Pain description: stiffness   Aggravating factors: Standing , walking, activity and bending  Relieving factors: oxycodone   PRECAUTIONS: None  WEIGHT BEARING RESTRICTIONS Yes WBAT   FALLS:  Has patient fallen in last 6 months? No  LIVING ENVIRONMENT: No steps into her house  OCCUPATION: retried   PLOF: Independent with household mobility with  device Was using a cane due to knee collapsing   PATIENT GOALS  To have less pain and to start being able to move again   OBJECTIVE:   DIAGNOSTIC FINDINGS:  Nothing post op   TODAY'S TREATMENT:  PATIENT SURVEYS:  Pt not in FOTO. LEFS:  49/80  LOWER EXTREMITY ROM:  Passive ROM Right eval Left eval 10/20 Right  Right  11/15 Right  Right Right 12/13 Right 1/12  Hip flexion          Hip extension          Hip abduction          Hip adduction          Hip internal rotation          Hip external rotation          Knee flexion 50 with pain and guarding  65 85 90 AROM/PROM:  85-87/90 AROM:  89 PROM:  92 deg   PROM:  105 deg   Knee extension -6  -4 -2 0 Lacking 1.5 deg  0  Ankle dorsiflexion          Ankle plantarflexion          Ankle inversion          Ankle eversion           (Blank rows = not tested)   LOWER EXTREMITY MMT:    MMT Right 01/28/22 Left 01/28/22  Hip flexion 33.6 31.4  Hip extension    Hip abduction 48.4 30.6  Hip adduction    Hip internal rotation    Hip external rotation    Knee flexion    Knee extension 32.2 32.6  Ankle dorsiflexion    Ankle plantarflexion    Ankle inversion    Ankle eversion     (Blank rows = not tested)   Gait:  Pt minimally favors R LE with gait and has decreased toe off on R and increased toe out.  Pt has no significant limp. Stairs:  Pt ascended stairs with a reciprocal gait without the rail and descended stairs with a step through gait with bilat rails.   Today's Treatment:  1/30 Manual: PROM into flexion and extension; trigger point release and STM to posterior knee and IT band;  ; patella heat and creep mobilization  PA and AP grade III and IV mobilization to the knee to improve flexion and extension;   Leg press like fitness 2 x 20 25 pounds Hamstring curl like fitness 10 pounds 2 x 15 [employer] fitness knee extension 10 pounds 2 x 15 [employer] hip abduction 2 x 15 70 pounds  Reviewed goals and final  HEP   Last visit:  Ex  bike 5 min with rocking   Manual: PROM into flexion and extension; trigger point release and STM to posterior knee and IT band;  ; patella heat and creep mobilization  PA and AP grade III and IV mobilization to the knee to improve flexion and extension;  mulligan flexion mobilization with strap  Straight leg raise 3 x 10 1.5 pound weight LAQ 3 x 10 3 pound weight  Reviewed gym equipment: [employer]yles leg press 25 pounds 3 x 10 Cybex leg press 50 pounds 3 x 10 Reviewed the differences and set up between the 2 machines  Cybex knee extension 3 x 10 10 pounds Patient advised that it may be early for this exercise but she had no pain felt like it gave her good quad work  Hip abduction machine 50 pounds 3 x 15 reviewed set up and use    12/29 Ex bike 5 min with rocking   Manual: PROM into flexion and extension; trigger point release and STM to posterior knee and IT band;  ; patella heat and creep mobilization  PA and AP grade III and IV mobilization to the knee to improve flexion and extension;  mulligan flexion mobilization with strap 12/27 Ex bike 5 min with rocking   Manual: PROM into flexion and extension; trigger point release and STM to posterior knee and IT band;  ; patella heat and creep mobilization  PA and AP grade III and IV mobilization to the knee to improve flexion and extension;  mulligan flexion mobilization with strap   12/22 Manual: PROM into flexion and extension; trigger point release and STM to posterior knee and IT band;  ; patella heat and creep mobilization  PA and AP grade III and IV mobilization to the knee to improve flexion and extension;  mulligan flexion mobilization with strap     PATIENT EDUCATION:  Education details: Instructed pt to cont with HEP.  symptom management  Person educated: Patient Education method: Explanation, Demonstration, Tactile cues, Verbal cues, and Handouts Education comprehension: verbalized  understanding, returned demonstration, verbal cues required, tactile cues required, and needs further education   HOME EXERCISE PROGRAM: Access Code: [email or web address] URL: [email or web address] Date: 11/03/2021 Prepared by: Signa Drier   ASSESSMENT:  CLINICAL IMPRESSION: The patient comes back after 2-week layoff following sickness.  She has not been able to work on her range of motion or strengthening secondary to illness.  She has not lost any range of motion.  She continues to have good knee flexion.  She still has work to do.  Her total arc was measured at 0-105 today.  We reviewed gym exercises for her gym program.  The patient feels comfortable and confident that she can continue with her home exercises on her own and at the gym.  She will follow-up if if her range of motion backtracks or becomes stalled.  We will otherwise DC to HEP. OBJECTIVE IMPAIRMENTS Abnormal gait, decreased activity tolerance, decreased balance, decreased endurance, decreased mobility, difficulty walking, decreased ROM, decreased strength, impaired perceived functional ability, and pain.   ACTIVITY LIMITATIONS carrying, lifting, bending, sitting, standing, squatting, stairs, transfers, bed mobility, and locomotion level  PARTICIPATION LIMITATIONS: meal prep, cleaning, laundry, driving, shopping, community activity, and yard work  PERSONAL FACTORS 1-2 comorbidities: right RTC repair; prior right knee surgery  are also affecting patient's functional outcome.   REHAB POTENTIAL: Good significant pain   CLINICAL DECISION MAKING: Evolving/moderate complexity pain and swelling limiting her ability to get in an out of the car and bed  EVALUATION COMPLEXITY: Moderate   GOALS: Goals reviewed with patient? Yes  SHORT TERM GOALS: Target date: 12/02/2021  Patient will increase passive flexion to 75 degrees  Baseline: Goal status: achieved 11/15  2. Patient will increase hip flexor and knee extensor  strength to 3+/5  Baseline:  Goal status:Improving 11/14 ; did not assess on 12/20  3.  Patient will demonstrate a 2 cm decrease in mid patella edema on the right  Baseline:  Goal status: per visual inspection significant improvement in swelling ; did not assess on 12/20   LONG TERM GOALS: Target date: 12/02/2021   Patient will got up/down 6 steps without pain  Baseline:  Goal status: GOAL MET 1/30  2.  Patient will bend knee 115 degrees without pain in order to get in and out of the car comfortably  Baseline:  Goal status: ONGOING 1/30   3.  Patient will ambulate 2000' with self report of no increase in pain with LRAD Baseline:  Goal status: PROGRESSING 1/30    PLAN: PT FREQUENCY: 2x/week  PT DURATION: 6 weeks  PLANNED INTERVENTIONS: Therapeutic exercises, Therapeutic activity, Neuromuscular re-education, Balance training, Gait training, Patient/Family education, Self Care, Joint mobilization, DME instructions, Aquatic Therapy, Electrical stimulation, Cryotherapy, Moist heat, Taping, Ultrasound, and Manual therapy  PLAN FOR NEXT SESSION: continue to progress exercises as tolerated. Continue with agressive ROM as tolerated.  Cont with manual techniques.  MD is holding off of the manipulation for another week.  He hopes to see a 5-10 deg improvement in flexion.    Signa Drier PT DPT  01/30/22 9:52 AM

## 2022-03-18 ENCOUNTER — Encounter: Payer: Self-pay | Admitting: Family Medicine

## 2022-03-18 ENCOUNTER — Ambulatory Visit (INDEPENDENT_AMBULATORY_CARE_PROVIDER_SITE_OTHER): Payer: Medicare Other | Admitting: Family Medicine

## 2022-03-18 VITALS — BP 132/86 | HR 55 | Temp 98.6°F | Ht 66.0 in | Wt 246.2 lb

## 2022-03-18 DIAGNOSIS — M549 Dorsalgia, unspecified: Secondary | ICD-10-CM

## 2022-03-18 DIAGNOSIS — R109 Unspecified abdominal pain: Secondary | ICD-10-CM | POA: Diagnosis not present

## 2022-03-18 LAB — CBC WITH DIFFERENTIAL/PLATELET
Basophils Absolute: 0 10*3/uL (ref 0.0–0.1)
Basophils Relative: 0.8 % (ref 0.0–3.0)
Eosinophils Absolute: 0.2 10*3/uL (ref 0.0–0.7)
Eosinophils Relative: 3.2 % (ref 0.0–5.0)
HCT: 40.5 % (ref 36.0–46.0)
Hemoglobin: 13.9 g/dL (ref 12.0–15.0)
Lymphocytes Relative: 29.1 % (ref 12.0–46.0)
Lymphs Abs: 1.7 10*3/uL (ref 0.7–4.0)
MCHC: 34.4 g/dL (ref 30.0–36.0)
MCV: 90.4 fl (ref 78.0–100.0)
Monocytes Absolute: 0.6 10*3/uL (ref 0.1–1.0)
Monocytes Relative: 10.3 % (ref 3.0–12.0)
Neutro Abs: 3.3 10*3/uL (ref 1.4–7.7)
Neutrophils Relative %: 56.6 % (ref 43.0–77.0)
Platelets: 231 10*3/uL (ref 150.0–400.0)
RBC: 4.48 Mil/uL (ref 3.87–5.11)
RDW: 12.9 % (ref 11.5–15.5)
WBC: 5.9 10*3/uL (ref 4.0–10.5)

## 2022-03-18 LAB — COMPREHENSIVE METABOLIC PANEL
ALT: 15 U/L (ref 0–35)
AST: 18 U/L (ref 0–37)
Albumin: 3.8 g/dL (ref 3.5–5.2)
Alkaline Phosphatase: 63 U/L (ref 39–117)
BUN: 16 mg/dL (ref 6–23)
CO2: 30 mEq/L (ref 19–32)
Calcium: 9.4 mg/dL (ref 8.4–10.5)
Chloride: 104 mEq/L (ref 96–112)
Creatinine, Ser: 0.68 mg/dL (ref 0.40–1.20)
GFR: 91.03 mL/min (ref >60.00)
Glucose, Bld: 95 mg/dL (ref 70–99)
Potassium: 3.8 mEq/L (ref 3.5–5.1)
Sodium: 143 mEq/L (ref 135–145)
Total Bilirubin: 1 mg/dL (ref 0.2–1.2)
Total Protein: 7.1 g/dL (ref 6.0–8.3)

## 2022-03-18 LAB — POCT URINALYSIS DIPSTICK
Bilirubin, UA: NEGATIVE
Blood, UA: NEGATIVE
Glucose, UA: NEGATIVE
Ketones, UA: NEGATIVE
Nitrite, UA: NEGATIVE
Protein, UA: NEGATIVE
Spec Grav, UA: 1.02 (ref 1.010–1.025)
Urobilinogen, UA: 0.2 E.U./dL
pH, UA: 6 (ref 5.0–8.0)

## 2022-03-18 LAB — LIPASE: Lipase: 26 U/L (ref 11.0–59.0)

## 2022-03-18 NOTE — Progress Notes (Signed)
Established Patient Office Visit   Subjective  Patient ID: Alicia [name], female    DOB: Jul 30, 1955  Age: 67 y.o. MRN: [id number]  Chief Complaint  Patient presents with   Back Pain    Pt c/o back pain. Going on 2 wks. Hx of kidney stones. Pain radiate to R side.     Pt is a 67 yo female with  has a past medical history of Allergy, Cataract, Chronic tension headaches, varicella, Hypertension, Near syncope, Nephrolithiasis, Palpitations, Thyroid disease, Tubular adenoma of colon, s/p cholecystectomy, and Varicose veins followed by Dr. Regis Bill and seen for ongoing concern.  Pt with R mid back pain x wks that has moved to R flank and RUQ.  Pt endorses sensation of gas but notes h/o IBS.  Does not feel like sensation is resolved with passing gas.  Denies n/v, hematuria, frequency, pressure, constipation.  Pt states she has increased water intake.  Was drinking maybe 2 bottles per day.  Also drinking several cups of coffee in am and a glass of tea.  Pt in PT s/p R TKR in Jan.  Notes temp elevated around 23F most days s/p surgery.  Pt does feel the back pain with bending and twisting motions.   Back Pain      Review of Systems  Musculoskeletal:  Positive for back pain.   Negative unless stated above    Objective:     BP 132/86 (BP Location: Left Arm, Patient Position: Sitting, Cuff Size: Large)   Pulse (!) 55   Temp 98.6 F (37 C) (Oral)   Ht '5\' 6"'$  (1.676 m)   Wt 246 lb 3.2 oz (111.7 kg)   SpO2 97%   BMI 39.74 kg/m    Physical Exam Constitutional:      General: She is not in acute distress.    Appearance: Normal appearance.  HENT:     Head: Normocephalic and atraumatic.     Nose: Nose normal.     Mouth/Throat:     Mouth: Mucous membranes are moist.  Cardiovascular:     Rate and Rhythm: Normal rate and regular rhythm.     Heart sounds: Normal heart sounds. No murmur heard.    No gallop.  Pulmonary:     Effort: Pulmonary effort is normal. No respiratory distress.      Breath sounds: Normal breath sounds. No wheezing, rhonchi or rales.  Abdominal:     General: Bowel sounds are normal.     Palpations: Abdomen is soft.     Tenderness: There is abdominal tenderness in the suprapubic area. There is no right CVA tenderness or left CVA tenderness.  Musculoskeletal:       Arms:     Comments: Mild TTP of R mid thoracic back  Skin:    General: Skin is warm and dry.  Neurological:     Mental Status: She is alert and oriented to person, place, and time.      Results for orders placed or performed in visit on 03/18/22  POC Urinalysis Dipstick  Result Value Ref Range   Color, UA dark yellow    Clarity, UA hazy    Glucose, UA Negative Negative   Bilirubin, UA negative    Ketones, UA Negative    Spec Grav, UA 1.020 1.010 - 1.025   Blood, UA negative    pH, UA 6.0 5.0 - 8.0   Protein, UA Negative Negative   Urobilinogen, UA 0.2 0.2 or 1.0 E.U./dL   Nitrite, UA negative  Leukocytes, UA Trace (A) Negative   Appearance     Odor        Assessment & Plan:  Back pain, unspecified back location, unspecified back pain laterality, unspecified chronicity -     POCT urinalysis dipstick -     Urine Culture; Future -     CBC with Differential/Platelet -     Comprehensive metabolic panel -     US RENAL; Future  Right flank pain -     CBC with Differential/Platelet -     Comprehensive metabolic panel -     Lipase -     US RENAL; Future  Patient with back pain with radiation to right flank and RUQ times several weeks.  Discussed possible causes including UTI, renal calculi, muscle strain.  UA obtained in clinic with 1+ leuks obtain UCX.  Will obtain labs to rule out liver dysfunction.  Patient status post cholecystectomy.  Discussed stone study, however patient declines as would like to avoid CT scan.  Renal ultrasound placed.  Increase p.o. intake of fluids.  Given strict precautions.  No follow-ups on file.   Billie Ruddy, MD

## 2022-03-19 LAB — URINE CULTURE
MICRO NUMBER:: 14637988
SPECIMEN QUALITY:: ADEQUATE

## 2022-03-21 ENCOUNTER — Telehealth: Payer: Self-pay | Admitting: Internal Medicine

## 2022-03-21 NOTE — Telephone Encounter (Signed)
Referral [id number] requesting a sooner appointment.

## 2022-03-29 ENCOUNTER — Ambulatory Visit
Admission: RE | Admit: 2022-03-29 | Discharge: 2022-03-29 | Disposition: A | Discharge status: Home / Self Care | Payer: Federal, State, Local not specified - PPO | Source: Ambulatory Visit | Attending: Family Medicine | Admitting: Family Medicine

## 2022-03-29 DIAGNOSIS — M549 Dorsalgia, unspecified: Secondary | ICD-10-CM

## 2022-03-29 DIAGNOSIS — R109 Unspecified abdominal pain: Secondary | ICD-10-CM

## 2022-04-04 ENCOUNTER — Telehealth: Payer: Self-pay | Admitting: Internal Medicine

## 2022-04-04 NOTE — Telephone Encounter (Signed)
Contacted Alicia [name] to schedule their annual wellness visit. Appointment made for 04/20/22.  Alicia [name] AWV direct phone # [phone]

## 2022-04-20 ENCOUNTER — Ambulatory Visit (INDEPENDENT_AMBULATORY_CARE_PROVIDER_SITE_OTHER): Payer: Medicare Other

## 2022-04-20 VITALS — Ht 66.0 in | Wt 246.0 lb

## 2022-04-20 DIAGNOSIS — Z0000 Encounter for general adult medical examination without abnormal findings: Secondary | ICD-10-CM

## 2022-04-20 NOTE — Progress Notes (Signed)
Subjective:   Alicia [name] is a 67 y.o. female who presents for Medicare Annual (Subsequent) preventive examination.  Review of Systems    Virtual Visit via Telephone Note  I connected with  [name] on 04/20/22 at 11:15 AM EDT by telephone and verified that I am speaking with the correct person using two identifiers.  Location: Patient: Home Provider: Office Persons participating in the virtual visit: patient/Nurse Health Advisor   I discussed the limitations, risks, security and privacy concerns of performing an evaluation and management service by telephone and the availability of in person appointments. The patient expressed understanding and agreed to proceed.  Interactive audio and video telecommunications were attempted between this nurse and patient, however failed, due to patient having technical difficulties OR patient did not have access to video capability.  We continued and completed visit with audio only.  Some vital signs may be absent or patient reported.   [name], LPN  Cardiac Risk Factors include: advanced age (>74men, >20 women);hypertension     Objective:    Today's Vitals   04/20/22 1113  Weight: 246 lb (111.6 kg)  Height: 5\' 6"  (1.676 m)   Body mass index is 39.71 kg/m.     04/20/2022   11:19 AM 11/02/2021    9:52 AM 08/19/2020   10:04 AM 01/02/2017   12:18 PM 03/03/2015    3:56 PM 09/01/2014   12:52 PM 08/29/2014    1:02 PM  Advanced Directives  Does Patient Have a Medical Advance Directive? No No No No No No No  Would patient like information on creating a medical advance directive? No - Patient declined No - Patient declined  No - Patient declined Yes - Educational materials given No - patient declined information No - patient declined information    Current Medications (verified) Outpatient Encounter Medications as of 04/20/2022  Medication Sig   aspirin 325 MG tablet Take 325 mg by mouth daily. (Patient not taking:  Reported on 03/18/2022)   chlorthalidone (HYGROTON) 25 MG tablet Take 1 tablet (25 mg total) by mouth daily.   losartan (COZAAR) 25 MG tablet Take 1 tablet (25 mg total) by mouth daily. (Patient not taking: Reported on 03/18/2022)   Multiple Vitamin (MULTIVITAMIN WITH MINERALS) TABS tablet Take 1 tablet by mouth daily. Women's formula   No facility-administered encounter medications on file as of 04/20/2022.    Allergies (verified) Ciprofloxacin hcl, Iodinated contrast media, Iohexol, Lidocaine hcl, Morphine, Sulfa antibiotics, Prednisone, Valsartan, Shellfish allergy, Sulfur, Ciprofloxacin, Penicillins, Sulfamethoxazole-trimethoprim, and Sulfonamide derivatives   History: Past Medical History:  Diagnosis Date   Allergy    Cataract    removed bilateral -2014   Chronic tension headaches    Hx of varicella    Hypertension    Kidney stones    Near syncope    Nephrolithiasis    history of  passed on own.    Palpitations    Thyroid disease    pt. denies 06/11/19   Tubular adenoma of colon 02/1997   w/ HGD   Varicose veins    Past Surgical History:  Procedure Laterality Date   ABDOMINAL HYSTERECTOMY     fibroids non cancer    CATARACT EXTRACTION     Bilateral   cataract surgery Right 03/02/2012   CHOLECYSTECTOMY     KNEE SURGERY  04/2020   KNEE SURGERY     ROTATOR CUFF REPAIR Right    TONSILLECTOMY     Family History  Problem Relation Age  of Onset   Cancer Mother        CERVICAL uterin cancer    Liver disease Mother        from chronic heaptitis    Diabetes Mother    Heart disease Father        avdisease avr   Stroke Father    Prostate cancer Father    Cancer Sister        Ovarian and endometrial   Cancer Maternal Aunt        Colon   Heart disease Maternal Grandfather    Heart disease Paternal Grandmother    Heart disease Paternal Grandfather    Diabetes Sister    Diabetes Brother    Colon cancer Paternal Aunt    Esophageal cancer Paternal Uncle    Rectal cancer  Neg Hx    Stomach cancer Neg Hx    Social History   Socioeconomic History   Marital status: Married    Spouse name: Not on file   Number of children: 3   Years of education: 16   Highest education level: Not on file  Occupational History   Occupation: Product manager: [employer]  Tobacco Use   Smoking status: Never   Smokeless tobacco: Never  Vaping Use   Vaping Use: Never used  Substance and Sexual Activity   Alcohol use: No   Drug use: No   Sexual activity: Yes    Partners: Male  Other Topics Concern   Not on file  Social History Narrative   [employer]- BA. 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 2 daughters- '82, '93, 1 son- '87. 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   Lives with her husband   2 dogs   orig from [employer]   Currently ob FMLA for dad awaiting valve surgery   Social Determinants of Health   Financial Resource Strain: Low Risk  (04/20/2022)   Overall Financial Resource Strain (CARDIA)    Difficulty of Paying Living Expenses: Not hard at all  Food Insecurity: No Food Insecurity (04/20/2022)   Hunger Vital Sign    Worried About Running Out of Food in the Last Year: Never true    Ran Out of Food in the Last Year: Never true  Transportation Needs: No Transportation Needs (04/20/2022)   PRAPARE - Hydrologist (Medical): No    Lack of Transportation (Non-Medical): No  Physical Activity: Insufficiently Active (04/20/2022)   Exercise Vital Sign    Days of Exercise per Week: 2 days    Minutes of Exercise per Session: 60 min  Stress: No Stress Concern Present (04/20/2022)   [place]    Feeling of Stress : Not at all  Social Connections: Moderately Isolated (04/20/2022)   Social Connection and Isolation Panel [NHANES]    Frequency of Communication  with Friends and Family: More than three times a week    Frequency of Social Gatherings with Friends and Family: More than three times a week    Attends Religious Services: Never    Marine scientist or Organizations: No    Attends Archivist Meetings: Never    Marital Status: Married    Tobacco Counseling Counseling given: Not Answered   Clinical Intake:  Pre-visit preparation completed:  No  Pain : No/denies pain     BMI - recorded: 39.71 Nutritional Status: BMI > 30  Obese Nutritional Risks: None Diabetes: No  How often do you need to have someone help you when you read instructions, pamphlets, or other written materials from your doctor or pharmacy?: 1 - Never  Diabetic?  No  Interpreter Needed?: No  Information entered by :: Rolene Arbour LPN   Activities of Daily Living    04/20/2022   11:19 AM  In your present state of health, do you have any difficulty performing the following activities:  Hearing? 0  Vision? 0  Difficulty concentrating or making decisions? 0  Walking or climbing stairs? 0  Dressing or bathing? 0  Doing errands, shopping? 0  Preparing Food and eating ? N  Using the Toilet? N  In the past six months, have you accidently leaked urine? N  Do you have problems with loss of bowel control? N  Managing your Medications? N  Managing your Finances? N  Housekeeping or managing your Housekeeping? N    Patient Care Team: Panosh, Standley Brooking, MD as PCP - General (Internal Medicine) Fay Records, MD as PCP - Cardiology (Cardiology) Early, Arvilla Meres, MD (Vascular Surgery) Paula Compton, MD as Consulting Physician (Obstetrics and Gynecology) Earlean Polka, MD as Consulting Physician (Ophthalmology) Valentino Saxon, MD (Orthopedic Surgery)  Indicate any recent Medical Services you may have received from other than [facility] providers in the past year (date may be approximate).     Assessment:   This is a routine wellness examination  for Raksha.  Hearing/Vision screen Hearing Screening - Comments:: Denies hearing difficulties   Vision Screening - Comments:: Wears reading glasses - up to date with routine eye exams with  [place] issues and exercise activities discussed: Exercise limited by: None identified   Goals Addressed               This Visit's Progress     Lose weight (pt-stated)         Depression Screen    04/20/2022   11:18 AM 03/18/2022    8:44 AM 03/03/2015    3:56 PM  PHQ 2/9 Scores  PHQ - 2 Score 0 0 0  PHQ- 9 Score 0 2     Fall Risk    04/20/2022   11:19 AM 03/18/2022    8:44 AM 03/03/2015    3:56 PM  Fall Risk   Falls in the past year? 0 0 Yes  Number falls in past yr: 0 0 1  Injury with Fall? 0 0 Yes  Comment   injured knee and back after tripping over vacuum cleaner  Risk for fall due to : No Fall Risks No Fall Risks Other (Comment)  Risk for fall due to: Comment   unknown  Follow up Falls prevention discussed Falls evaluation completed Falls prevention discussed    [place]:  Any stairs in or around the home? No  If so, are there any without handrails? No  Home free of loose throw rugs in walkways, pet beds, electrical cords, etc? Yes  Adequate lighting in your home to reduce risk of falls? Yes   ASSISTIVE DEVICES UTILIZED TO PREVENT FALLS:  Life alert? No  Use of a cane, walker or w/c? No  Grab bars in the bathroom? No  Shower chair or bench in shower? No  Elevated toilet seat or a handicapped toilet? No   TIMED  UP AND GO:  Was the test performed? No . Audio Visit  Cognitive Function:        04/20/2022   11:20 AM  6CIT Screen  What Year? 0 points  What month? 0 points  What time? 0 points  Count back from 20 0 points  Months in reverse 0 points  Repeat phrase 0 points  Total Score 0 points    Immunizations Immunization History  Administered Date(s) Administered   Influenza Whole 10/02/2009    PFIZER(Purple Top)SARS-COV-2 Vaccination 03/16/2019, 04/06/2019   Td 02/18/2003    TDAP status: Due, Education has been provided regarding the importance of this vaccine. Advised may receive this vaccine at local pharmacy or Health Dept. Aware to provide a copy of the vaccination record if obtained from local pharmacy or Health Dept. Verbalized acceptance and understanding.  Flu Vaccine status: Up to date  Pneumococcal vaccine status: Due, Education has been provided regarding the importance of this vaccine. Advised may receive this vaccine at local pharmacy or Health Dept. Aware to provide a copy of the vaccination record if obtained from local pharmacy or Health Dept. Verbalized acceptance and understanding.  Covid-19 vaccine status: Completed vaccines  Qualifies for Shingles Vaccine? Yes   Zostavax completed No   Shingrix Completed?: No.    Education has been provided regarding the importance of this vaccine. Patient has been advised to call insurance company to determine out of pocket expense if they have not yet received this vaccine. Advised may also receive vaccine at local pharmacy or Health Dept. Verbalized acceptance and understanding.  Screening Tests Health Maintenance  Topic Date Due   DTaP/Tdap/Td (2 - Tdap) 02/17/2013   COVID-19 Vaccine (3 - Pfizer risk series) 05/06/2022 (Originally 05/04/2019)   Zoster Vaccines- Shingrix (1 of 2) 07/20/2022 (Originally 03/22/1975)   Pneumonia Vaccine 1+ Years old (1 of 1 - PCV) 04/20/2023 (Originally 03/21/2021)   MAMMOGRAM  04/20/2023 (Originally 08/16/2020)   DEXA SCAN  04/20/2023 (Originally 03/21/2021)   INFLUENZA VACCINE  08/18/2022   Medicare Annual Wellness (AWV)  04/20/2023   COLONOSCOPY (Pts 45-82yrs Insurance coverage will need to be confirmed)  06/11/2026   Hepatitis C Screening  Completed   HPV VACCINES  Aged Out    Health Maintenance  Health Maintenance Due  Topic Date Due   DTaP/Tdap/Td (2 - Tdap) 02/17/2013     Colorectal cancer screening: Type of screening: Colonoscopy. Completed 06/11/19. Repeat every 7 years  Mammogram status: Ordered Patient deferred. Pt provided with contact info and advised to call to schedule appt.   Bone Density status: Ordered Patient deferred. Pt provided with contact info and advised to call to schedule appt.  Lung Cancer Screening: (Low Dose CT Chest recommended if Age 72-80 years, 30 pack-year currently smoking OR have quit w/in 15years.) does not qualify.     Additional Screening:  Hepatitis C Screening: does qualify; Completed 07/17/15  Vision Screening: Recommended annual ophthalmology exams for early detection of glaucoma and other disorders of the eye. Is the patient up to date with their annual eye exam?  Yes  Who is the provider or what is the name of the office in which the patient attends annual eye exams? [facility] If pt is not established with a provider, would they like to be referred to a provider to establish care? No .   Dental Screening: Recommended annual dental exams for proper oral hygiene  Community Resource Referral / Chronic Care Management:  CRR required this visit?  No  CCM required this visit?  No      Plan:     I have personally reviewed and noted the following in the patient's chart:   Medical and social history Use of alcohol, tobacco or illicit drugs  Current medications and supplements including opioid prescriptions. Patient is not currently taking opioid prescriptions. Functional ability and status Nutritional status Physical activity Advanced directives List of other physicians Hospitalizations, surgeries, and ER visits in previous 12 months Vitals Screenings to include cognitive, depression, and falls Referrals and appointments  In addition, I have reviewed and discussed with patient certain preventive protocols, quality metrics, and best practice recommendations. A written personalized care plan for  preventive services as well as general preventive health recommendations were provided to patient.     [name], LPN   [id number]   Nurse Notes: None

## 2022-04-20 NOTE — Patient Instructions (Addendum)
Alicia [name] , Thank you for taking time to come for your Medicare Wellness Visit. I appreciate your ongoing commitment to your health goals. Please review the following plan we discussed and let me know if I can assist you in the future.   These are the goals we discussed:  Goals       Lose weight (pt-stated)        This is a list of the screening recommended for you and due dates:  Health Maintenance  Topic Date Due   DTaP/Tdap/Td vaccine (2 - Tdap) 02/17/2013   COVID-19 Vaccine (3 - Pfizer risk series) 05/06/2022*   Zoster (Shingles) Vaccine (1 of 2) 07/20/2022*   Pneumonia Vaccine (1 of 1 - PCV) 04/20/2023*   Mammogram  04/20/2023*   DEXA scan (bone density measurement)  04/20/2023*   Flu Shot  08/18/2022   Medicare Annual Wellness Visit  04/20/2023   Colon Cancer Screening  06/11/2026   Hepatitis C Screening: USPSTF Recommendation to screen - Ages 18-79 yo.  Completed   HPV Vaccine  Aged Out  *Topic was postponed. The date shown is not the original due date.    Advanced directives: Advance directive discussed with you today. Even though you declined this today, please call our office should you change your mind, and we can give you the proper paperwork for you to fill out.   Conditions/risks identified: None  Next appointment: Follow up in one year for your annual wellness visit    Preventive Care 66 Years and Older, Female Preventive care refers to lifestyle choices and visits with your health care provider that can promote health and wellness. What does preventive care include? A yearly physical exam. This is also called an annual well check. Dental exams once or twice a year. Routine eye exams. Ask your health care provider how often you should have your eyes checked. Personal lifestyle choices, including: Daily care of your teeth and gums. Regular physical activity. Eating a healthy diet. Avoiding tobacco and drug use. Limiting alcohol use. Practicing safe  sex. Taking low-dose aspirin every day. Taking vitamin and mineral supplements as recommended by your health care provider. What happens during an annual well check? The services and screenings done by your health care provider during your annual well check will depend on your age, overall health, lifestyle risk factors, and family history of disease. Counseling  Your health care provider may ask you questions about your: Alcohol use. Tobacco use. Drug use. Emotional well-being. Home and relationship well-being. Sexual activity. Eating habits. History of falls. Memory and ability to understand (cognition). Work and work Statistician. Reproductive health. Screening  You may have the following tests or measurements: Height, weight, and BMI. Blood pressure. Lipid and cholesterol levels. These may be checked every 5 years, or more frequently if you are over 56 years old. Skin check. Lung cancer screening. You may have this screening every year starting at age 62 if you have a 30-pack-year history of smoking and currently smoke or have quit within the past 15 years. Fecal occult blood test (FOBT) of the stool. You may have this test every year starting at age 100. Flexible sigmoidoscopy or colonoscopy. You may have a sigmoidoscopy every 5 years or a colonoscopy every 10 years starting at age 3. Hepatitis C blood test. Hepatitis B blood test. Sexually transmitted disease (STD) testing. Diabetes screening. This is done by checking your blood sugar (glucose) after you have not eaten for a while (fasting). You may have this done  every 1-3 years. Bone density scan. This is done to screen for osteoporosis. You may have this done starting at age 47. Mammogram. This may be done every 1-2 years. Talk to your health care provider about how often you should have regular mammograms. Talk with your health care provider about your test results, treatment options, and if necessary, the need for more  tests. Vaccines  Your health care provider may recommend certain vaccines, such as: Influenza vaccine. This is recommended every year. Tetanus, diphtheria, and acellular pertussis (Tdap, Td) vaccine. You may need a Td booster every 10 years. Zoster vaccine. You may need this after age 3. Pneumococcal 13-valent conjugate (PCV13) vaccine. One dose is recommended after age 70. Pneumococcal polysaccharide (PPSV23) vaccine. One dose is recommended after age 55. Talk to your health care provider about which screenings and vaccines you need and how often you need them. This information is not intended to replace advice given to you by your health care provider. Make sure you discuss any questions you have with your health care provider. Document Released: 01/30/2015 Document Revised: 09/23/2015 Document Reviewed: 11/04/2014 Elsevier Interactive Patient Education  2017 [place] Prevention in the Home Falls can cause injuries. They can happen to people of all ages. There are many things you can do to make your home safe and to help prevent falls. What can I do on the outside of my home? Regularly fix the edges of walkways and driveways and fix any cracks. Remove anything that might make you trip as you walk through a door, such as a raised step or threshold. Trim any bushes or trees on the path to your home. Use bright outdoor lighting. Clear any walking paths of anything that might make someone trip, such as rocks or tools. Regularly check to see if handrails are loose or broken. Make sure that both sides of any steps have handrails. Any raised decks and porches should have guardrails on the edges. Have any leaves, snow, or ice cleared regularly. Use sand or salt on walking paths during winter. Clean up any spills in your garage right away. This includes oil or grease spills. What can I do in the bathroom? Use night lights. Install grab bars by the toilet and in the tub and shower.  Do not use towel bars as grab bars. Use non-skid mats or decals in the tub or shower. If you need to sit down in the shower, use a plastic, non-slip stool. Keep the floor dry. Clean up any water that spills on the floor as soon as it happens. Remove soap buildup in the tub or shower regularly. Attach bath mats securely with double-sided non-slip rug tape. Do not have throw rugs and other things on the floor that can make you trip. What can I do in the bedroom? Use night lights. Make sure that you have a light by your bed that is easy to reach. Do not use any sheets or blankets that are too big for your bed. They should not hang down onto the floor. Have a firm chair that has side arms. You can use this for support while you get dressed. Do not have throw rugs and other things on the floor that can make you trip. What can I do in the kitchen? Clean up any spills right away. Avoid walking on wet floors. Keep items that you use a lot in easy-to-reach places. If you need to reach something above you, use a strong step stool that has  a grab bar. Keep electrical cords out of the way. Do not use floor polish or wax that makes floors slippery. If you must use wax, use non-skid floor wax. Do not have throw rugs and other things on the floor that can make you trip. What can I do with my stairs? Do not leave any items on the stairs. Make sure that there are handrails on both sides of the stairs and use them. Fix handrails that are broken or loose. Make sure that handrails are as long as the stairways. Check any carpeting to make sure that it is firmly attached to the stairs. Fix any carpet that is loose or worn. Avoid having throw rugs at the top or bottom of the stairs. If you do have throw rugs, attach them to the floor with carpet tape. Make sure that you have a light switch at the top of the stairs and the bottom of the stairs. If you do not have them, ask someone to add them for you. What else  can I do to help prevent falls? Wear shoes that: Do not have high heels. Have rubber bottoms. Are comfortable and fit you well. Are closed at the toe. Do not wear sandals. If you use a stepladder: Make sure that it is fully opened. Do not climb a closed stepladder. Make sure that both sides of the stepladder are locked into place. Ask someone to hold it for you, if possible. Clearly mark and make sure that you can see: Any grab bars or handrails. First and last steps. Where the edge of each step is. Use tools that help you move around (mobility aids) if they are needed. These include: Canes. Walkers. Scooters. Crutches. Turn on the lights when you go into a dark area. Replace any light bulbs as soon as they burn out. Set up your furniture so you have a clear path. Avoid moving your furniture around. If any of your floors are uneven, fix them. If there are any pets around you, be aware of where they are. Review your medicines with your doctor. Some medicines can make you feel dizzy. This can increase your chance of falling. Ask your doctor what other things that you can do to help prevent falls. This information is not intended to replace advice given to you by your health care provider. Make sure you discuss any questions you have with your health care provider. Document Released: 10/30/2008 Document Revised: 06/11/2015 Document Reviewed: 02/07/2014 Elsevier Interactive Patient Education  2017 [employer].

## 2022-05-24 ENCOUNTER — Encounter: Payer: Self-pay | Admitting: Internal Medicine

## 2022-05-24 ENCOUNTER — Ambulatory Visit (INDEPENDENT_AMBULATORY_CARE_PROVIDER_SITE_OTHER): Payer: Medicare Other | Admitting: Internal Medicine

## 2022-05-24 VITALS — BP 138/78 | HR 56 | Temp 98.4°F | Ht 66.0 in | Wt 249.0 lb

## 2022-05-24 DIAGNOSIS — R0781 Pleurodynia: Secondary | ICD-10-CM

## 2022-05-24 DIAGNOSIS — R194 Change in bowel habit: Secondary | ICD-10-CM | POA: Diagnosis not present

## 2022-05-24 DIAGNOSIS — R109 Unspecified abdominal pain: Secondary | ICD-10-CM

## 2022-05-24 DIAGNOSIS — R634 Abnormal weight loss: Secondary | ICD-10-CM

## 2022-05-24 DIAGNOSIS — R079 Chest pain, unspecified: Secondary | ICD-10-CM

## 2022-05-24 LAB — POCT URINALYSIS DIPSTICK
Bilirubin, UA: POSITIVE
Blood, UA: NEGATIVE
Glucose, UA: NEGATIVE
Ketones, UA: NEGATIVE
Nitrite, UA: NEGATIVE
Protein, UA: POSITIVE — AB
Urobilinogen, UA: 0.2 E.U./dL
pH, UA: 5.5 (ref 5.0–8.0)

## 2022-05-24 NOTE — Patient Instructions (Addendum)
Will look into  getting mri  imaging to  evaluated pain in rib cage right side pain .As  you want to avoid radiation.   Lab appears up to  date.  At this time.  I agree   seeing the GI team will send note to Dr Russella Dar and see  if can be seen earlier than July.   Wt Readings from Last 3 Encounters:  05/24/22 249 lb (112.9 kg)  04/20/22 246 lb (111.6 kg)  03/18/22 246 lb 3.2 oz (111.7 kg)

## 2022-05-24 NOTE — Progress Notes (Unsigned)
Chief Complaint  Patient presents with   Flank Pain    Pt c/o constant dull/ache pain on R side. Going on for a while. Pain worsen when in bed. Had [place] in March. Nothing abnormal.    HPI: Alicia [name] 67 y.o. come in for on going  discomfort pain on right side   Pain in side   on going   for  over a year. Now more persistent waxes and wanes but now persistent   Like a dull ache constant . All the time. Notes more when in bed.  Right lateral abd thorax  moving may make worse . But no injury  No pain when in PT for knee rehab surgery  no assoc with eating  evacuation  but  .  IBS: getting worse and to see Dr Russella Dar in July . Diarrhea after eating certain foods  will have all day  Bland helps. Seems to be worse after covid last march  Previous traveled to [place]. May not be pain related  Any evaluation  wants to limit avoid radiation exposure  Stresses Knee surgery  had some necrosis  but a lot better now.  Brother metastatic brain cancer  ( from kidney)   ROS: See pertinent positives and negatives per HPI. Hemorrhoids   surgery.( [facility])  Postponed  Retired Dentist  Past Medical History:  Diagnosis Date   Allergy    Cataract    removed bilateral -2014   Chronic tension headaches    Hx of varicella    Hypertension    Kidney stones    Near syncope    Nephrolithiasis    history of  passed on own.    Palpitations    Thyroid disease    pt. denies 06/11/19   Tubular adenoma of colon 02/1997   w/ HGD   Varicose veins     Family History  Problem Relation Age of Onset   Cancer Mother        CERVICAL uterin cancer    Liver disease Mother        from chronic heaptitis    Diabetes Mother    Heart disease Father        avdisease avr   Stroke Father    Prostate cancer Father    Cancer Sister        Ovarian and endometrial   Cancer Maternal Aunt        Colon   Heart disease Maternal Grandfather    Heart disease Paternal Grandmother     Heart disease Paternal Grandfather    Diabetes Sister    Diabetes Brother    Colon cancer Paternal Aunt    Esophageal cancer Paternal Uncle    Rectal cancer Neg Hx    Stomach cancer Neg Hx     Social History   Socioeconomic History   Marital status: Married    Spouse name: Not on file   Number of children: 3   Years of education: 16   Highest education level: Not on file  Occupational History   Occupation: Magazine features editor: [employer]  Tobacco Use   Smoking status: Never   Smokeless tobacco: Never  Vaping Use   Vaping Use: Never used  Substance and Sexual Activity   Alcohol use: No   Drug use: No   Sexual activity: Yes    Partners: Male  Other Topics Concern   Not on file  Social History Narrative  [employer]- BA. 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 2 daughters- '82, '93, 1 son- '87. 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   Lives with her husband   2 dogs   orig from [employer]   Currently ob FMLA for dad awaiting valve surgery   Social Determinants of Health   Financial Resource Strain: Low Risk  (04/20/2022)   Overall Financial Resource Strain (CARDIA)    Difficulty of Paying Living Expenses: Not hard at all  Food Insecurity: No Food Insecurity (04/20/2022)   Hunger Vital Sign    Worried About Running Out of Food in the Last Year: Never true    Ran Out of Food in the Last Year: Never true  Transportation Needs: No Transportation Needs (04/20/2022)   PRAPARE - Administrator, Civil Service (Medical): No    Lack of Transportation (Non-Medical): No  Physical Activity: Insufficiently Active (04/20/2022)   Exercise Vital Sign    Days of Exercise per Week: 2 days    Minutes of Exercise per Session: 60 min  Stress: No Stress Concern Present (04/20/2022)   [employer] of Occupational Health - Occupational Stress Questionnaire     Feeling of Stress : Not at all  Social Connections: Moderately Isolated (04/20/2022)   Social Connection and Isolation Panel [NHANES]    Frequency of Communication with Friends and Family: More than three times a week    Frequency of Social Gatherings with Friends and Family: More than three times a week    Attends Religious Services: Never    Database administrator or Organizations: No    Attends Banker Meetings: Never    Marital Status: Married    Outpatient Medications Prior to Visit  Medication Sig Dispense Refill   chlorthalidone (HYGROTON) 25 MG tablet Take 1 tablet (25 mg total) by mouth daily. 90 tablet 3   Multiple Vitamin (MULTIVITAMIN WITH MINERALS) TABS tablet Take 1 tablet by mouth daily. Women's formula     aspirin 325 MG tablet Take 325 mg by mouth daily. (Patient not taking: Reported on 03/18/2022)     losartan (COZAAR) 25 MG tablet Take 1 tablet (25 mg total) by mouth daily. (Patient not taking: Reported on 03/18/2022) 90 tablet 3   No facility-administered medications prior to visit.     EXAM:  BP 138/78 (BP Location: Left Arm, Cuff Size: Large)   Pulse (!) 56   Temp 98.4 F (36.9 C) (Oral)   Ht 5\' 6"  (1.676 m)   Wt 249 lb (112.9 kg)   SpO2 97%   BMI 40.19 kg/m   Body mass index is 40.19 kg/m.  GENERAL: vitals reviewed and listed above, alert, oriented, appears well hydrated and in no acute distress HEENT: atraumatic, conjunctiva  clear, no obvious abnormalities on inspection of external nose and ears NECK: no obvious masses on inspection palpation  LUNGS: clear to auscultation bilaterally, no wheezes, rales or rhonchi, good air movement Discomf  is area seem tobe localized ti right anateriolateral rib cage 8-10?  No lesion  obvious  no back pain point tenderness Abdomen:  Sof,t normal bowel sounds without hepatosplenomegaly, no guarding rebound or masses no CVA tenderness CV: HRRR, no clubbing cyanosis or  peripheral edema nl cap refill  MS: moves  all extremities without noticeable focal  abnormality PSYCH: pleasant and cooperative, no  obvious depression or anxiety Lab Results  Component Value Date   WBC 5.9 03/18/2022   HGB 13.9 03/18/2022   HCT 40.5 03/18/2022   PLT 231.0 03/18/2022   GLUCOSE 95 03/18/2022   CHOL 201 (H) 12/01/2020   TRIG 128 12/01/2020   HDL 68 12/01/2020   LDLCALC 111 (H) 12/01/2020   ALT 15 03/18/2022   AST 18 03/18/2022   NA 143 03/18/2022   K 3.8 03/18/2022   CL 104 03/18/2022   CREATININE 0.68 03/18/2022   BUN 16 03/18/2022   CO2 30 03/18/2022   TSH 2.560 12/01/2020   INR 1.0 ratio 04/03/2009   HGBA1C 6.1 (H) 12/01/2020   BP Readings from Last 3 Encounters:  05/24/22 138/78  03/18/22 132/86  05/11/21 (!) 140/94   Wt Readings from Last 3 Encounters:  05/24/22 249 lb (112.9 kg)  04/20/22 246 lb (111.6 kg)  03/18/22 246 lb 3.2 oz (111.7 kg)    Had renal US in March  had ct abd 8 2022 hepatic steatosis  ASSESSMENT AND PLAN:  Discussed the following assessment and plan:  Right-sided chest pain  Rib pain on right side  Right-sided abdominal pain of unknown cause anterolateral right chest wall - persisted and and progressed . - Plan: POC Urinalysis Dipstick  Change in bowel habit - frequent diarrhea she atributes to IBS  diet change and some wt loss with this. uncertain but prob not related to r side discomfort thinks began after covid inf  Weight loss - over past year usual in 265 range  may be from diet change On going uncertain cause ? If chest abd wall issue  consider further imagining she wants to avoid ct radiation if possible  and asks if MRI can be used .  Uncertain if will be approved but will plan fu  Also will send info to Dr Russella Dar and see if she  can be seen earlier than July  50 minutes discussion about possible further evaluation   seems more localized tol ower anterolateral chest wall  uncertain   -Patient advised to return or notify health care team  if  new concerns  arise.  Patient Instructions  Will look into  getting mri  imaging to  evaluated pain in rib cage right side pain .As  you want to avoid radiation.   Lab appears up to  date.  At this time.  I agree   seeing the GI team will send note to Dr Russella Dar and see  if can be seen earlier than July.   Wt Readings from Last 3 Encounters:  05/24/22 249 lb (112.9 kg)  04/20/22 246 lb (111.6 kg)  03/18/22 246 lb 3.2 oz (111.7 kg)    Burna Mortimer K. Makilah Dowda M.D.

## 2022-05-25 NOTE — Progress Notes (Signed)
Alicia [name], Please schedule her in one of the urgent APP clinic days in May. MS

## 2022-05-25 NOTE — Progress Notes (Signed)
We will work on a sooner appt for her

## 2022-05-26 ENCOUNTER — Telehealth: Payer: Self-pay

## 2022-05-26 NOTE — Telephone Encounter (Signed)
Meryl Dare, MD filed at 05/25/2022  9:00 PM  Status: Signed  We will work on a sooner appt for her    Meryl Dare, MD filed at 05/25/2022  9:01 PM  Status: Signed  Vivian Okelley, Please schedule her in one of the urgent APP clinic days in May. MS      Loretha Stapler, RN filed at 05/26/2022  9:28 AM  Status: Addendum  Sheri opened May 15 with Hyacinth Meeker PA I have added her to that date.

## 2022-05-26 NOTE — Progress Notes (Addendum)
Sheri opened May 15 with Hyacinth Meeker PA I have added her to that date.

## 2022-05-26 NOTE — Telephone Encounter (Signed)
I have called and notified the pt of the appt date and time with JLL

## 2022-06-01 ENCOUNTER — Ambulatory Visit (INDEPENDENT_AMBULATORY_CARE_PROVIDER_SITE_OTHER): Payer: Medicare Other | Admitting: Physician Assistant

## 2022-06-01 ENCOUNTER — Encounter: Payer: Self-pay | Admitting: Physician Assistant

## 2022-06-01 ENCOUNTER — Other Ambulatory Visit: Payer: Federal, State, Local not specified - PPO

## 2022-06-01 VITALS — BP 142/82 | HR 65 | Ht 66.0 in | Wt 246.4 lb

## 2022-06-01 DIAGNOSIS — R197 Diarrhea, unspecified: Secondary | ICD-10-CM

## 2022-06-01 DIAGNOSIS — K644 Residual hemorrhoidal skin tags: Secondary | ICD-10-CM | POA: Diagnosis not present

## 2022-06-01 DIAGNOSIS — M549 Dorsalgia, unspecified: Secondary | ICD-10-CM

## 2022-06-01 MED ORDER — COLESTIPOL HCL 1 G PO TABS: 1.0000 g | ORAL_TABLET | Freq: Two times a day (BID) | ORAL | 5 refills | Status: DC

## 2022-06-01 NOTE — Progress Notes (Signed)
Chief Complaint: Diarrhea  HPI:    Alicia [name] is a 67 year old female with a past medical history as listed below, known to Dr. Russella Dar, who was referred to me by Panosh, Neta Mends, MD for a complaint of diarrhea.      06/11/2019 colonoscopy with external hemorrhoids, severe diverticulosis throughout the colon, grade 2 internal hemorrhoids and otherwise normal.  Repeat recommended in 7 years.    09/07/2020 office visit with Dr. Russella Dar for right lower quadrant abdominal pain related to IBS-D and musculoskeletal pain.  Patient was scheduled for a CTAP and given a trial of IBgard.    03/18/2022 CBC, CMP and lipase all normal.    03/29/2022 renal ultrasound with no significant abnormalities.    05/25/2022 patient seen in clinic by the surgical team.  At that time described bleeding per rectum, diarrhea for the past several weeks 5-6 times a day.  At that time rectal exam with anterior anal canal small prolapsed internal hemorrhoid, grade 3 and mucosal surfaces some irritation from prolapse.  Patient was scheduled for hemorrhoidectomy.    Today, the patient tells me that she is having a few problems.  She starts out by telling me that 30 years ago after she had a cholecystectomy she started with IBS-D and has battled with this her entire life.  Symptoms do seem to increase with stress and anxiety and she tells me she has had a lot of stress recently with her brother's health.  She has noticed an increase in only loose stools about 4-5 times a day.  This does seem like her typical symptoms just "worse".  She did have a knee replacement in October and was on antibiotics around that time and other meds, but has not been on them recently.    Along with above describes a right sided back pain.  Tells me this has been there since February timeframe.  She had an ultrasound as above which did not show any kidney stones.  This is just kind of a dull pain, occasionally she will put a heating pad on it and it does help.  No  change with bowel habits.    Patient follow general surgery in regards to hemorrhoidectomy.    Denies fever, chills or weight loss.  Past Medical History:  Diagnosis Date   Allergy    Cataract    removed bilateral -2014   Chronic tension headaches    Hx of varicella    Hypertension    Kidney stones    Near syncope    Nephrolithiasis    history of  passed on own.    Palpitations    Thyroid disease    pt. denies 06/11/19   Tubular adenoma of colon 02/1997   w/ HGD   Varicose veins     Past Surgical History:  Procedure Laterality Date   ABDOMINAL HYSTERECTOMY     fibroids non cancer    CATARACT EXTRACTION     Bilateral   cataract surgery Right 03/02/2012   CHOLECYSTECTOMY     KNEE SURGERY  04/2020   KNEE SURGERY     ROTATOR CUFF REPAIR Right    TONSILLECTOMY      Current Outpatient Medications  Medication Sig Dispense Refill   aspirin 325 MG tablet Take 325 mg by mouth daily. (Patient not taking: Reported on 03/18/2022)     chlorthalidone (HYGROTON) 25 MG tablet Take 1 tablet (25 mg total) by mouth daily. 90 tablet 3   losartan (COZAAR) 25 MG tablet  Take 1 tablet (25 mg total) by mouth daily. (Patient not taking: Reported on 03/18/2022) 90 tablet 3   Multiple Vitamin (MULTIVITAMIN WITH MINERALS) TABS tablet Take 1 tablet by mouth daily. Women's formula     No current facility-administered medications for this visit.    Allergies as of 06/01/2022 - Review Complete 05/24/2022  Allergen Reaction Noted   Ciprofloxacin hcl Hives 05/21/2012   Iodinated contrast media Anaphylaxis and Other (See Comments) 02/16/2011   Iohexol Anaphylaxis 07/19/2010   Lidocaine hcl Other (See Comments) 03/14/2016   Morphine Other (See Comments) 05/21/2012   Sulfa antibiotics Hives and Rash 05/21/2012   Prednisone Other (See Comments) 04/10/2015   Valsartan Cough 01/20/2015   Shellfish allergy Diarrhea and Nausea Only 03/03/2015   Sulfur  02/03/2021   Ciprofloxacin Itching and Rash  02/16/2011   Penicillins Rash    Sulfamethoxazole-trimethoprim Rash    Sulfonamide derivatives Rash     Family History  Problem Relation Age of Onset   Cancer Mother        CERVICAL uterin cancer    Liver disease Mother        from chronic heaptitis    Diabetes Mother    Heart disease Father        avdisease avr   Stroke Father    Prostate cancer Father    Cancer Sister        Ovarian and endometrial   Cancer Maternal Aunt        Colon   Heart disease Maternal Grandfather    Heart disease Paternal Grandmother    Heart disease Paternal Grandfather    Diabetes Sister    Diabetes Brother    Colon cancer Paternal Aunt    Esophageal cancer Paternal Uncle    Rectal cancer Neg Hx    Stomach cancer Neg Hx     Social History   Socioeconomic History   Marital status: Married    Spouse name: Not on file   Number of children: 3   Years of education: 16   Highest education level: Not on file  Occupational History   Occupation: Magazine features editor: [employer]  Tobacco Use   Smoking status: Never   Smokeless tobacco: Never  Vaping Use   Vaping Use: Never used  Substance and Sexual Activity   Alcohol use: No   Drug use: No   Sexual activity: Yes    Partners: Male  Other Topics Concern   Not on file  Social History Narrative   [employer]- BA. 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 2 daughters- '82, '93, 1 son- '87. 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   Lives with her husband   2 dogs   orig from [employer]   Currently ob FMLA for dad awaiting valve surgery   Social Determinants of Health   Financial Resource Strain: Low Risk  (04/20/2022)   Overall Financial Resource Strain (CARDIA)    Difficulty of Paying Living Expenses: Not hard at all  Food Insecurity: No Food Insecurity (04/20/2022)   Hunger Vital Sign    Worried About Running Out of Food in  the Last Year: Never true    Ran Out of Food in the Last Year: Never true  Transportation Needs: No Transportation Needs (04/20/2022)   PRAPARE - Administrator, Civil Service (  Medical): No    Lack of Transportation (Non-Medical): No  Physical Activity: Insufficiently Active (04/20/2022)   Exercise Vital Sign    Days of Exercise per Week: 2 days    Minutes of Exercise per Session: 60 min  Stress: No Stress Concern Present (04/20/2022)   [employer] of Occupational Health - Occupational Stress Questionnaire    Feeling of Stress : Not at all  Social Connections: Moderately Isolated (04/20/2022)   Social Connection and Isolation Panel [NHANES]    Frequency of Communication with Friends and Family: More than three times a week    Frequency of Social Gatherings with Friends and Family: More than three times a week    Attends Religious Services: Never    Database administrator or Organizations: No    Attends Banker Meetings: Never    Marital Status: Married  Catering manager Violence: Not At Risk (04/20/2022)   Humiliation, Afraid, Rape, and Kick questionnaire    Fear of Current or Ex-Partner: No    Emotionally Abused: No    Physically Abused: No    Sexually Abused: No    Review of Systems:    Constitutional: No weight loss, fever or chills Cardiovascular: No chest pain   Respiratory: No SOB Gastrointestinal: See HPI and otherwise negative   Physical Exam:  Vital signs: BP (!) 142/82   Pulse 65   Ht 5\' 6"  (1.676 m)   Wt 246 lb 6.4 oz (111.8 kg)   BMI 39.77 kg/m    Constitutional:   Pleasant overweight Caucasian female appears to be in NAD, Well developed, Well nourished, alert and cooperative Respiratory: Respirations even and unlabored. Lungs clear to auscultation bilaterally.   No wheezes, crackles, or rhonchi.  Cardiovascular: Normal S1, S2. No MRG. Regular rate and rhythm. No peripheral edema, cyanosis or pallor.  Gastrointestinal:  Soft,  nondistended, nontender. No rebound or guarding. Normal bowel sounds. No appreciable masses or hepatomegaly. Rectal:  Not performed.  MSK: +pain to palpation over bottom of posterior rib cage Psychiatric:Demonstrates good judgement and reason without abnormal affect or behaviors.  RELEVANT LABS AND IMAGING: CBC    Component Value Date/Time   WBC 5.9 03/18/2022 0859   RBC 4.48 03/18/2022 0859   HGB 13.9 03/18/2022 0859   HGB 13.9 12/01/2020 1159   HCT 40.5 03/18/2022 0859   HCT 39.9 12/01/2020 1159   PLT 231.0 03/18/2022 0859   PLT 227 12/01/2020 1159   MCV 90.4 03/18/2022 0859   MCV 89 12/01/2020 1159   MCH 30.9 04/04/2021 0506   MCHC 34.4 03/18/2022 0859   RDW 12.9 03/18/2022 0859   RDW 11.6 (L) 12/01/2020 1159   LYMPHSABS 1.7 03/18/2022 0859   MONOABS 0.6 03/18/2022 0859   EOSABS 0.2 03/18/2022 0859   BASOSABS 0.0 03/18/2022 0859    CMP     Component Value Date/Time   NA 143 03/18/2022 0859   NA 142 12/01/2020 1159   K 3.8 03/18/2022 0859   CL 104 03/18/2022 0859   CO2 30 03/18/2022 0859   GLUCOSE 95 03/18/2022 0859   BUN 16 03/18/2022 0859   BUN 14 12/01/2020 1159   CREATININE 0.68 03/18/2022 0859   CALCIUM 9.4 03/18/2022 0859   PROT 7.1 03/18/2022 0859   ALBUMIN 3.8 03/18/2022 0859   AST 18 03/18/2022 0859   ALT 15 03/18/2022 0859   ALKPHOS 63 03/18/2022 0859   BILITOT 1.0 03/18/2022 0859   GFRNONAA >60 04/04/2021 0506   GFRAA 109 02/15/2019 0858  Assessment: 1.  Diarrhea: Increase in chronic diarrhea over the past 6 months or so, previously diagnosed with IBS-D, patient tells me symptoms started after her cholecystectomy; consider infectious cause versus bile salt induced most likely 2.  Right-sided back pain: Consider costochondritis versus other musculoskeletal etiology 3.  External hemorrhoids: Seeing general surgery for hemorrhoidectomy in a month  Plan: 1.  Discussed with patient that some of her diarrhea could be from her postcholecystectomy  state.  Prescribed Colestipol 1 g twice daily #60 with 5 refills.  Discussed we can titrate this up as needed. 2.  Will also do stool studies today including a culture, O&P and C. difficile PCR as well as Calprotectin given that this diarrhea does seem different per the patient 3.  Discussed right-sided back pain, this is tender to palpation over her rib cage, discussed the possibility of costochondritis.  Recommend she continue to use heating pads and she could try Tylenol.  Her PCP is order an MRI for further eval. 4.  Patient to follow in clinic me in 2 months or sooner if necessary.  Hyacinth Meeker, PA-C [facility] 06/01/2022, 1:40 PM  Cc: Madelin Headings, MD

## 2022-06-01 NOTE — Patient Instructions (Addendum)
Your provider has requested that you go to the basement level for lab work before leaving today. Press "B" on the elevator. The lab is located at the first door on the left as you exit the elevator.  We have sent the following medications to your pharmacy for you to pick up at your convenience: Colestipol 1 g twice daily.   _______________________________________________________  If your blood pressure at your visit was 140/90 or greater, please contact your primary care physician to follow up on this.  _______________________________________________________  If you are age 41 or older, your body mass index should be between 23-30. Your Body mass index is 39.77 kg/m. If this is out of the aforementioned range listed, please consider follow up with your Primary Care Provider.  If you are age 50 or younger, your body mass index should be between 19-25. Your Body mass index is 39.77 kg/m. If this is out of the aformentioned range listed, please consider follow up with your Primary Care Provider.   ________________________________________________________  The [facility] providers would like to encourage you to use [facility] to communicate with providers for non-urgent requests or questions.  Due to long hold times on the telephone, sending your provider a message by [facility] may be a faster and more efficient way to get a response.  Please allow 48 business hours for a response.  Please remember that this is for non-urgent requests.  _______________________________________________________

## 2022-06-03 ENCOUNTER — Other Ambulatory Visit: Payer: Federal, State, Local not specified - PPO

## 2022-06-03 DIAGNOSIS — M549 Dorsalgia, unspecified: Secondary | ICD-10-CM

## 2022-06-03 DIAGNOSIS — R197 Diarrhea, unspecified: Secondary | ICD-10-CM

## 2022-06-05 LAB — STOOL CULTURE: E coli, Shiga toxin Assay: NEGATIVE

## 2022-06-06 LAB — CLOSTRIDIUM DIFFICILE TOXIN B, QUALITATIVE, REAL-TIME PCR: Toxigenic C. Difficile by PCR: NOT DETECTED

## 2022-06-06 LAB — STOOL CULTURE

## 2022-06-07 ENCOUNTER — Ambulatory Visit
Admission: RE | Admit: 2022-06-07 | Discharge: 2022-06-07 | Disposition: A | Discharge status: Home / Self Care | Payer: Federal, State, Local not specified - PPO | Source: Ambulatory Visit | Attending: Internal Medicine | Admitting: Internal Medicine

## 2022-06-07 DIAGNOSIS — R079 Chest pain, unspecified: Secondary | ICD-10-CM

## 2022-06-07 DIAGNOSIS — R0789 Other chest pain: Secondary | ICD-10-CM

## 2022-06-07 DIAGNOSIS — R0781 Pleurodynia: Secondary | ICD-10-CM

## 2022-06-07 DIAGNOSIS — R109 Unspecified abdominal pain: Secondary | ICD-10-CM

## 2022-06-07 DIAGNOSIS — R194 Change in bowel habit: Secondary | ICD-10-CM

## 2022-06-07 DIAGNOSIS — R634 Abnormal weight loss: Secondary | ICD-10-CM

## 2022-06-07 LAB — STOOL CULTURE

## 2022-06-08 LAB — OVA AND PARASITE EXAMINATION
CONCENTRATE RESULT:: NONE SEEN
MICRO NUMBER:: 14971526
SPECIMEN QUALITY:: ADEQUATE
TRICHROME RESULT:: NONE SEEN

## 2022-06-09 ENCOUNTER — Telehealth: Payer: Self-pay | Admitting: Physician Assistant

## 2022-06-09 ENCOUNTER — Other Ambulatory Visit: Payer: Self-pay | Admitting: Internal Medicine

## 2022-06-09 LAB — CALPROTECTIN, FECAL: Calprotectin, Fecal: 97 ug/g (ref 0–120)

## 2022-06-09 NOTE — Telephone Encounter (Signed)
Patient calling states she is returning a call. Please advise

## 2022-06-09 NOTE — Telephone Encounter (Signed)
Returned call to patient. See 5/17 stool study result note for details.

## 2022-06-14 ENCOUNTER — Telehealth: Payer: Self-pay | Admitting: Internal Medicine

## 2022-06-14 NOTE — Telephone Encounter (Signed)
Pt would like a call back to go over her labs.

## 2022-06-16 NOTE — Progress Notes (Signed)
Mri results show no significant finding  this is reassuring although doesn't give [place] definitive answer I would think the kidney cyst usually not cause this type of  pain under this circumstance  and is seen as benign

## 2022-06-16 NOTE — Telephone Encounter (Signed)
Pt reports she has question regards to her MRI and would like to discuss it with Dr. Fabian Sharp. Pt wanted to know "can cyst on her kidney cause the side pain and should she follow up with nephrology?"  Scheduled a virtual video visit for pt to speak with provider on June 4.  Forwarding to provider for [facility]

## 2022-06-21 ENCOUNTER — Encounter: Payer: Self-pay | Admitting: Internal Medicine

## 2022-06-21 ENCOUNTER — Telehealth (INDEPENDENT_AMBULATORY_CARE_PROVIDER_SITE_OTHER): Payer: Medicare Other | Admitting: Internal Medicine

## 2022-06-21 VITALS — BP 138/80 | HR 68 | Ht 66.0 in | Wt 246.0 lb

## 2022-06-21 DIAGNOSIS — R109 Unspecified abdominal pain: Secondary | ICD-10-CM | POA: Diagnosis not present

## 2022-06-21 DIAGNOSIS — N281 Cyst of kidney, acquired: Secondary | ICD-10-CM

## 2022-06-21 DIAGNOSIS — R079 Chest pain, unspecified: Secondary | ICD-10-CM | POA: Diagnosis not present

## 2022-06-21 DIAGNOSIS — R0781 Pleurodynia: Secondary | ICD-10-CM | POA: Diagnosis not present

## 2022-06-21 NOTE — Assessment & Plan Note (Signed)
Noted on [place] and mri of chest  and in past   seems simple and not alarming but  broth had right renal cancer  doubt if causing the right side pain that seems ms cause  but reasonable to get opinion about need for fu other by urology ( once recovered from hemorrhoidal procedure . She will contact us for referral at that time

## 2022-06-21 NOTE — Progress Notes (Signed)
Virtual Visit via Video Note  I connected with Alicia [name] on 06/21/22 at  3:30 PM EDT by a video enabled telemedicine application and verified that I am speaking with the correct person using two identifiers. Location patient: home Location provider:work  office Persons participating in the virtual visit: patient, provider    Patient aware  of the limitations of evaluation and management by telemedicine and  availability of in person appointments. and agreed to proceed.  HPI: Alicia [name] presents for video visit  fu mri st chest  and lung  results To have hemorrhoid procedure tomorrow . Pain still  there can wake her up if rolls in bed . No progression no new sx  Of not bro renal cancer with r renal cyst  ROS: See pertinent positives and negatives per HPI. Is followed [place] for ovarian cyst and ca 125  sis had ovaraian cancer  right  Past Medical History:  Diagnosis Date   Allergy    Cataract    removed bilateral -2014   Chronic tension headaches    Hx of varicella    Hypertension    Kidney stones    Near syncope    Nephrolithiasis    history of  passed on own.    Palpitations    Thyroid disease    pt. denies 06/11/19   Tubular adenoma of colon 02/1997   w/ HGD   Varicose veins     Past Surgical History:  Procedure Laterality Date   ABDOMINAL HYSTERECTOMY     fibroids non cancer    CATARACT EXTRACTION     Bilateral   cataract surgery Right 03/02/2012   CHOLECYSTECTOMY     KNEE SURGERY  04/2020   KNEE SURGERY     ROTATOR CUFF REPAIR Right    TONSILLECTOMY      Family History  Problem Relation Age of Onset   Cancer Mother        CERVICAL uterin cancer    Liver disease Mother        from chronic heaptitis    Diabetes Mother    Heart disease Father        avdisease avr   Stroke Father    Prostate cancer Father    Cancer Sister        Ovarian and endometrial   Cancer Maternal Aunt        Colon   Heart disease Maternal Grandfather    Heart  disease Paternal Grandmother    Heart disease Paternal Grandfather    Diabetes Sister    Diabetes Brother    Colon cancer Paternal Aunt    Esophageal cancer Paternal Uncle    Rectal cancer Neg Hx    Stomach cancer Neg Hx     Social History   Tobacco Use   Smoking status: Never   Smokeless tobacco: Never  Vaping Use   Vaping Use: Never used  Substance Use Topics   Alcohol use: No   Drug use: No      Current Outpatient Medications:    chlorthalidone (HYGROTON) 25 MG tablet, TAKE 1 TABLET(25 MG) BY MOUTH DAILY, Disp: 30 tablet, Rfl: 0   Multiple Vitamin (MULTIVITAMIN WITH MINERALS) TABS tablet, Take 1 tablet by mouth daily. Women's formula, Disp: , Rfl:    colestipol (COLESTID) 1 g tablet, Take 1 tablet (1 g total) by mouth 2 (two) times daily. (Patient not taking: Reported on 06/21/2022), Disp: 60 tablet, Rfl: 5  EXAM: BP Readings from Last 3 Encounters:  06/21/22 138/80  06/01/22 (!) 142/82  05/24/22 138/78    VITALS per patient if applicable:  GENERAL: alert, oriented, appears well and in no acute distress  HEENT: atraumatic, conjunttiva clear, no obvious abnormalities on inspection of external nose and ears  NECK: normal movements of the head and neck  LUNGS: on inspection no signs of respiratory distress, breathing rate appears normal, no obvious gross SOB, gasping or wheezing  CV: no obvious cyanosis   PSYCH/NEURO: pleasant and cooperative, no obvious depression or anxiety, speech and thought processing grossly intact Lab Results  Component Value Date   WBC 5.9 03/18/2022   HGB 13.9 03/18/2022   HCT 40.5 03/18/2022   PLT 231.0 03/18/2022   GLUCOSE 95 03/18/2022   CHOL 201 (H) 12/01/2020   TRIG 128 12/01/2020   HDL 68 12/01/2020   LDLCALC 111 (H) 12/01/2020   ALT 15 03/18/2022   AST 18 03/18/2022   NA 143 03/18/2022   K 3.8 03/18/2022   CL 104 03/18/2022   CREATININE 0.68 03/18/2022   BUN 16 03/18/2022   CO2 30 03/18/2022   TSH 2.560 12/01/2020    INR 1.0 ratio 04/03/2009   HGBA1C 6.1 (H) 12/01/2020  A right renal cyst measuring approximately 5.2 cm in diameter is incompletely visualized but appears simple. The cyst is unchanged compared to the comparison CT scan.   IMPRESSION: Negative exam. No acute abnormality or finding to explain patient's symptoms.     Electronically Signed   By: Drusilla Kanner M.D.   On: 06/15/2022 09:29  Renal measurements: 12 x 5.6 x 6.4 cm = volume: 226 mL. Contains 2 cysts with the largest measuring 4.9 cm No follow-up imaging recommended for the cysts.   Left Kidney:   Renal measurements: 10.6 x 6.5 x 4.9 cm = volume: 174 mL. Contains a 12 mm cyst. No follow-up imaging recommended for the cyst.   Bladder:   Appears normal for degree of bladder distention.   Other:   None.   IMPRESSION: No significant abnormalities.     Electronically Signed   By: Gerome Sam III M.D.   On: 03/29/2022 17:55 ASSESSMENT AND PLAN:  Discussed the following assessment and plan:    ICD-10-CM   1. Right-sided chest pain  R07.9     2. Rib pain on right side  R07.81     3. Right-sided abdominal pain of unknown cause anterolateral right chest wall  R10.9     4. Renal cyst, right  N28.1    seen on [place] and mri chest   prob benign and not causeing pain? after procedure recovery can contact us for consult referral to urology about implications of cyst    Right-sided chest pain  Rib pain on right side  Right-sided abdominal pain of unknown cause anterolateral right chest wall  Renal cyst, right Assessment & Plan: Noted on [place] and mri of chest  and in past   seems simple and not alarming but  broth had right renal cancer  doubt if causing the right side pain that seems ms cause  but reasonable to get opinion about need for fu other by urology ( once recovered from hemorrhoidal procedure . She will contact us for referral at that time    Counseled.   Expectant management and discussion of plan and  treatment with opportunity to ask questions and all were answered. The patient agreed with the plan and demonstrated an understanding of the instructions.   Advised to call back or seek  an in-person evaluation if worsening  or having  further concerns  in interim. Return for when planned see text .    Berniece Andreas, MD

## 2022-06-30 ENCOUNTER — Ambulatory Visit: Payer: Medicare Other | Admitting: Nurse Practitioner

## 2022-07-26 ENCOUNTER — Other Ambulatory Visit: Payer: Self-pay | Admitting: Internal Medicine

## 2022-08-09 ENCOUNTER — Ambulatory Visit: Payer: Medicare Other | Admitting: Gastroenterology

## 2022-10-27 ENCOUNTER — Other Ambulatory Visit: Payer: Self-pay | Admitting: Internal Medicine

## 2022-11-14 NOTE — Progress Notes (Unsigned)
Cardiology Office Note   Date:  11/17/2022   ID:  Alicia [name], DOB [birth year], MRN [id number]  PCP:  Madelin Headings, MD  Cardiologist:   Dietrich Pates, MD   Pt presents for f/u of HTN   History of Present Illness: Alicia [name] is a 67 y.o. female with a history of CP(atypical), palpitation  Echo in 2018 showed LVEF normal  Mild diastolic dysfunction I last sae the pt in the Nov 2022    I saw the pt in April 2023  Last fall she had partial knee replacement   Still has cartilage problems    Walks about 1/2 mile per day  She denies CP   Breathing is OK   BP at home on average is about 138/80    Does note heart rate increasing at times to about 100   WIth activity   Will last a few min then go back down      Feels this in her chest     Current Outpatient Medications  Medication Sig Dispense Refill   chlorthalidone (HYGROTON) 25 MG tablet TAKE 1 TABLET(25 MG) BY MOUTH DAILY 30 tablet 0   Multiple Vitamin (MULTIVITAMIN WITH MINERALS) TABS tablet Take 1 tablet by mouth daily. Women's formula     colestipol (COLESTID) 1 g tablet Take 1 tablet (1 g total) by mouth 2 (two) times daily. (Patient not taking: Reported on 06/21/2022) 60 tablet 5   No current facility-administered medications for this visit.    Allergies:   Ciprofloxacin hcl, Iodinated contrast media, Iohexol, Lidocaine hcl, Morphine, Sulfa antibiotics, Prednisone, Valsartan, Shellfish allergy, Sulfur, Ciprofloxacin, Penicillins, Sulfamethoxazole-trimethoprim, and Sulfonamide derivatives   Past Medical History:  Diagnosis Date   Allergy    Cataract    removed bilateral -2014   Chronic tension headaches    Hx of varicella    Hypertension    Kidney stones    Near syncope    Nephrolithiasis    history of  passed on own.    Palpitations    Thyroid disease    pt. denies 06/11/19   Tubular adenoma of colon 02/1997   w/ HGD   Varicose veins     Past Surgical History:  Procedure Laterality Date    ABDOMINAL HYSTERECTOMY     fibroids non cancer    CATARACT EXTRACTION     Bilateral   cataract surgery Right 03/02/2012   CHOLECYSTECTOMY     KNEE SURGERY  04/2020   KNEE SURGERY     ROTATOR CUFF REPAIR Right    TONSILLECTOMY       Social History:  The patient  reports that she has never smoked. She has never used smokeless tobacco. She reports that she does not drink alcohol and does not use drugs.   Family History:  The patient's family history includes Cancer in her maternal aunt, mother, and sister; Colon cancer in her paternal aunt; Diabetes in her brother, mother, and sister; Esophageal cancer in her paternal uncle; Heart disease in her father, maternal grandfather, paternal grandfather, and paternal grandmother; Liver disease in her mother; Prostate cancer in her father; Stroke in her father.    ROS:  Please see the history of present illness. All other systems are reviewed and  Negative to the above problem except as noted.    PHYSICAL EXAM: VS:  BP 136/82   Pulse (!) 56   Ht 5\' 6"  (1.676 m)   Wt 243 lb 12.8 oz (110.6 kg)   SpO2  93%   BMI 39.35 kg/m   GEN: Obese 67 yo  in no acute distress  HEENT: normal  Neck:  JVP is normal    No  carotid bruits Cardiac: RRR; No murmur   No LE edema Respiratory:  clear to auscultation GI: soft, nontender,  No hepatomegaly  MS: no deformity Moving all extremities   Skin: warm and dry, no rash  EKG:  EKG is ordered today.   SB 55 bpm     Lipid Panel    Component Value Date/Time   CHOL 201 (H) 12/01/2020 1159   TRIG 128 12/01/2020 1159   HDL 68 12/01/2020 1159   CHOLHDL 3.0 12/01/2020 1159   CHOLHDL 3 07/17/2015 1500   VLDL 27.8 07/17/2015 1500   LDLCALC 111 (H) 12/01/2020 1159      Wt Readings from Last 3 Encounters:  11/17/22 243 lb 12.8 oz (110.6 kg)  06/21/22 246 lb (111.6 kg)  06/01/22 246 lb 6.4 oz (111.8 kg)      ASSESSMENT AND PLAN:   1 HTN  BP is mildly elevated  On Hygroton    Will get labs Did not  tolerate ARBs or amlodipine   Bradycardia precludes other Ca blockers or b blockers   COnsider aldactone  2  Lipdis   Will check lipids today     3   Hepatic steatosis  again, recomm low carb diet   Chck A1 today  Increase walking, planning on getting a stationalry bike     Current medicines are reviewed at length with the patient today.  The patient does not have concerns regarding medicines.  F/U with me as needed if symptoms change   Signed, Dietrich Pates, MD  11/17/2022 9:08 AM    [facility] [address] [place], [place], [place]  [address] Phone: [phone]; Fax: [phone]

## 2022-11-17 ENCOUNTER — Ambulatory Visit: Payer: Medicare Other | Attending: Internal Medicine | Admitting: Internal Medicine

## 2022-11-17 VITALS — BP 136/82 | HR 56 | Ht 66.0 in | Wt 243.8 lb

## 2022-11-17 DIAGNOSIS — I10 Essential (primary) hypertension: Secondary | ICD-10-CM

## 2022-11-17 DIAGNOSIS — I872 Venous insufficiency (chronic) (peripheral): Secondary | ICD-10-CM

## 2022-11-17 DIAGNOSIS — E876 Hypokalemia: Secondary | ICD-10-CM | POA: Diagnosis present

## 2022-11-17 DIAGNOSIS — E079 Disorder of thyroid, unspecified: Secondary | ICD-10-CM

## 2022-11-17 DIAGNOSIS — E559 Vitamin D deficiency, unspecified: Secondary | ICD-10-CM | POA: Diagnosis present

## 2022-11-17 DIAGNOSIS — Z0000 Encounter for general adult medical examination without abnormal findings: Secondary | ICD-10-CM

## 2022-11-17 DIAGNOSIS — Z131 Encounter for screening for diabetes mellitus: Secondary | ICD-10-CM

## 2022-11-17 NOTE — Patient Instructions (Signed)
Medication Instructions:   *If you need a refill on your cardiac medications before your next appointment, please call your pharmacy*   Lab Work: CMET, CBC, TSH, VIT D, NMR, APO B, LIPO A, HGBA1C  If you have labs (blood work) drawn today and your tests are completely normal, you will receive your results only by: MyChart Message (if you have MyChart) OR A paper copy in the mail If you have any lab test that is abnormal or we need to change your treatment, we will call you to review the results.   Testing/Procedures:    Follow-Up: At [facility], you and your health needs are our priority.  As part of our continuing mission to provide you with exceptional heart care, we have created designated Provider Care Teams.  These Care Teams include your primary Cardiologist (physician) and Advanced Practice Providers (APPs -  Physician Assistants and Nurse Practitioners) who all work together to provide you with the care you need, when you need it.  We recommend signing up for the patient portal called "MyChart".  Sign up information is provided on this After Visit Summary.  MyChart is used to connect with patients for Virtual Visits (Telemedicine).  Patients are able to view lab/test results, encounter notes, upcoming appointments, etc.  Non-urgent messages can be sent to your provider as well.   To learn more about what you can do with MyChart, go to [email or web address].    Your next appointment:   1 year(s)  Provider:   Dietrich Pates, MD     Other Instructions

## 2022-11-18 LAB — CBC
Hematocrit: 42.8 % (ref 34.0–46.6)
Hemoglobin: 14.3 g/dL (ref 11.1–15.9)
MCH: 31.3 pg (ref 26.6–33.0)
MCHC: 33.4 g/dL (ref 31.5–35.7)
MCV: 94 fL (ref 79–97)
Platelets: 241 10*3/uL (ref 150–450)
RBC: 4.57 x10E6/uL (ref 3.77–5.28)
RDW: 12.2 % (ref 11.7–15.4)
WBC: 5.5 10*3/uL (ref 3.4–10.8)

## 2022-11-18 LAB — HEMOGLOBIN A1C
Est. average glucose Bld gHb Est-mCnc: 123 mg/dL
Hgb A1c MFr Bld: 5.9 % — ABNORMAL HIGH (ref 4.8–5.6)

## 2022-11-18 LAB — LIPOPROTEIN A (LPA): Lipoprotein (a): 28.1 nmol/L (ref <75.0)

## 2022-11-18 LAB — COMPREHENSIVE METABOLIC PANEL
ALT: 19 [IU]/L (ref 0–32)
AST: 20 [IU]/L (ref 0–40)
Albumin: 4.3 g/dL (ref 3.9–4.9)
Alkaline Phosphatase: 83 [IU]/L (ref 44–121)
BUN/Creatinine Ratio: 28 (ref 12–28)
BUN: 18 mg/dL (ref 8–27)
Bilirubin Total: 1 mg/dL (ref 0.0–1.2)
CO2: 26 mmol/L (ref 20–29)
Calcium: 9.4 mg/dL (ref 8.7–10.3)
Chloride: 105 mmol/L (ref 96–106)
Creatinine, Ser: 0.64 mg/dL (ref 0.57–1.00)
Globulin, Total: 2.5 g/dL (ref 1.5–4.5)
Glucose: 104 mg/dL — ABNORMAL HIGH (ref 70–99)
Potassium: 3.8 mmol/L (ref 3.5–5.2)
Sodium: 144 mmol/L (ref 134–144)
Total Protein: 6.8 g/dL (ref 6.0–8.5)
eGFR: 97 mL/min/{1.73_m2} (ref >59)

## 2022-11-18 LAB — NMR, LIPOPROFILE
Cholesterol, Total: 193 mg/dL (ref 100–199)
HDL Particle Number: 35.8 umol/L (ref >=30.5)
HDL-C: 67 mg/dL (ref >39)
LDL Particle Number: 1074 nmol/L — ABNORMAL HIGH (ref <1000)
LDL Size: 21.7 nm (ref >20.5)
LDL-C (NIH Calc): 108 mg/dL — ABNORMAL HIGH (ref 0–99)
LP-IR Score: <25 (ref <=45)
Small LDL Particle Number: 201 nmol/L (ref <=527)
Triglycerides: 99 mg/dL (ref 0–149)

## 2022-11-18 LAB — APOLIPOPROTEIN B: Apolipoprotein B: 84 mg/dL (ref <90)

## 2022-11-18 LAB — VITAMIN D 25 HYDROXY (VIT D DEFICIENCY, FRACTURES): Vit D, 25-Hydroxy: 34.3 ng/mL (ref 30.0–100.0)

## 2022-11-18 LAB — TSH: TSH: 2.94 u[IU]/mL (ref 0.450–4.500)

## 2022-11-21 ENCOUNTER — Other Ambulatory Visit: Payer: Self-pay

## 2022-11-21 DIAGNOSIS — I10 Essential (primary) hypertension: Secondary | ICD-10-CM

## 2022-11-21 DIAGNOSIS — I872 Venous insufficiency (chronic) (peripheral): Secondary | ICD-10-CM

## 2022-11-21 DIAGNOSIS — Z79899 Other long term (current) drug therapy: Secondary | ICD-10-CM

## 2022-11-21 DIAGNOSIS — E559 Vitamin D deficiency, unspecified: Secondary | ICD-10-CM

## 2022-11-21 DIAGNOSIS — E876 Hypokalemia: Secondary | ICD-10-CM

## 2022-11-21 MED ORDER — SPIRONOLACTONE 25 MG PO TABS: 12.5000 mg | ORAL_TABLET | Freq: Every day | ORAL | 3 refills | Status: DC

## 2022-11-21 MED ORDER — VITAMIN D3 50 MCG (2000 UT) PO CHEW: 2000.0000 [IU] | CHEWABLE_TABLET | Freq: Every day | ORAL | Status: AC

## 2022-12-02 ENCOUNTER — Other Ambulatory Visit: Payer: Self-pay | Admitting: Internal Medicine

## 2022-12-05 ENCOUNTER — Other Ambulatory Visit: Payer: Self-pay | Admitting: Internal Medicine

## 2022-12-05 MED ORDER — CHLORTHALIDONE 25 MG PO TABS: 25.0000 mg | ORAL_TABLET | Freq: Every day | ORAL | 3 refills | Status: DC

## 2023-01-04 ENCOUNTER — Ambulatory Visit: Payer: Medicare Other | Admitting: Internal Medicine

## 2023-01-19 ENCOUNTER — Ambulatory Visit: Payer: Self-pay | Admitting: Internal Medicine

## 2023-01-19 NOTE — Telephone Encounter (Signed)
 Chief Complaint: cough Symptoms: cough, mild SOB, chest congestion Frequency: sinceMonday Pertinent Negatives: Patient denies fever, sore throat Disposition: [] ED /[x] Urgent Care (no appt availability in office) / [x] Appointment(In office/virtual)/ []  [facility]/ [] Home Care/ [x] Refused Recommended Disposition /[] [facility]/ []  Follow-up with PCP Additional Notes: Patient reports she has been having symptoms of a URI since Monday. Patient reports cough, chest congestion, and mild SOB. Patient reports she did have a sore throat on Monday but that has since gone away. Patient states she has a history of pneumonia and would like to prevent this from turning into pneumonia. Per protocol, this RN attempted to schedule in office appt. No availability so this RN recommended being seen at [facility]. Patient refused and stated she wants PCP to call her in medication for her cough, as the OTC medication she is taking is not helping. Patient reports she does not want to go to UC unless the PCP specifically says that she needs to. This RN advised that she would forward the request to the appropriate person for follow-up. Patient advised to call back or go to UC with worsening symptoms. Patient verbalized understanding.    Copied from CRM [id number]. Topic: Clinical - Medication Refill >> Jan 19, 2023  8:36 AM Viola FALCON wrote: Most Recent Primary Care Visit:  Provider: CHARLETT HOWARD K  Department: [facility]  Visit Type: MYCHART VIDEO VISIT  Date: 06/21/2022  Medication: Patient would like an antibiotic sent to pharmacy for upper respiratory infection/cough/congestion *Pt has penicillin allergy*  Has the patient contacted their pharmacy? No (Agent: If no, request that the patient contact the pharmacy for the refill. If patient does not wish to contact the pharmacy document the reason why and proceed with request.) (Agent: If yes, when and what did the pharmacy advise?)  Is this the  correct pharmacy for this prescription? Yes If no, delete pharmacy and type the correct one.  This is the patient's preferred pharmacy:   [id number] [place], [place] - [address] AT [facility] [address] [facility] [place] [address] Phone: [phone] Fax: [phone]     Has the prescription been filled recently? No  Is the patient out of the medication? Yes  Has the patient been seen for an appointment in the last year OR does the patient have an upcoming appointment? Yes, wanted a appt today but there is no availability   Can we respond through MyChart? Yes  Agent: Please be advised that Rx refills may take up to 3 business days. We ask that you follow-up with your pharmacy. Reason for Disposition  [1] MILD difficulty breathing (e.g., minimal/no SOB at rest, SOB with walking, pulse <100) AND [2] still present when not coughing  Answer Assessment - Initial Assessment Questions 1. ONSET: When did the cough begin?      Monday 2. SEVERITY: How bad is the cough today?      moderate 3. SPUTUM: Describe the color of your sputum (none, dry cough; clear, white, yellow, green)     Dry cough 4. HEMOPTYSIS: Are you coughing up any blood? If so ask: How much? (flecks, streaks, tablespoons, etc.)     none 5. DIFFICULTY BREATHING: Are you having difficulty breathing? If Yes, ask: How bad is it? (e.g., mild, moderate, severe)    - MILD: No SOB at rest, mild SOB with walking, speaks normally in sentences, can lie down, no retractions, pulse < 100.    - MODERATE: SOB  at rest, SOB with minimal exertion and prefers to sit, cannot lie down flat, speaks in phrases, mild retractions, audible wheezing, pulse 100-120.    - SEVERE: Very SOB at rest, speaks in single words, struggling to breathe, sitting hunched forward, retractions, pulse > 120      mild 6. FEVER: Do you have a fever? If Yes, ask: What is your temperature, how was it  measured, and when did it start?     none 7. CARDIAC HISTORY: Do you have any history of heart disease? (e.g., heart attack, congestive heart failure)      none 8. LUNG HISTORY: Do you have any history of lung disease?  (e.g., pulmonary embolus, asthma, emphysema)     Hx of pneumonia 9. PE RISK FACTORS: Do you have a history of blood clots? (or: recent major surgery, recent prolonged travel, bedridden)     none 10. OTHER SYMPTOMS: Do you have any other symptoms? (e.g., runny nose, wheezing, chest pain)       Chest congestion  Protocols used: Cough - Acute Productive-A-AH

## 2023-01-20 NOTE — Telephone Encounter (Signed)
 Spoke to pt.  Pt reports she had went to [facility] urgent care last night. Was tested negative for covid and flu. Pt states she was prescribed to take cough medications. Pt states she did ask for antibiotic from them but was advise that antibiotic would not helps. Was advise to monitor her sx for fever, wheezing or worsen to come back for chest x-ray.  Pt states she will be leaving on 02/01/2023 to take care of her brother who has cancer. Wanted to make sure she doesn't get pneumonia as she did last time when she had bronchitis. Pt reports her sx is not worse or better since Monday. Still with chest congestion, cough and tired. Denied fever, bodyache.   Inform pt pcp is not working on Friday nor Monday but will forward to covering provider for an advise. Once receive an update will give pt a call. Meanwhile, advise pt to follow what urgent care physician had advise for her. Verbalized understanding.    Please advise.

## 2023-01-24 ENCOUNTER — Encounter: Payer: Self-pay | Admitting: Internal Medicine

## 2023-01-24 ENCOUNTER — Telehealth: Payer: Medicare Other | Admitting: Internal Medicine

## 2023-01-24 VITALS — BP 135/89 | Temp 98.9°F

## 2023-01-24 DIAGNOSIS — J069 Acute upper respiratory infection, unspecified: Secondary | ICD-10-CM | POA: Diagnosis not present

## 2023-01-24 DIAGNOSIS — R053 Chronic cough: Secondary | ICD-10-CM | POA: Diagnosis not present

## 2023-01-24 MED ORDER — AZITHROMYCIN 250 MG PO TABS: ORAL_TABLET | ORAL | 0 refills | Status: AC

## 2023-01-24 NOTE — Progress Notes (Signed)
 Virtual Visit via Video Note  I connected with Adaisha Campise on 01/24/23 at  3:45 PM EST by a video enabled telemedicine application and verified that I am speaking with the correct person using two identifiers. Location patient: home Location provider:work  Persons participating in the virtual visit: patient, provider  Patient aware  of the limitations of evaluation and management by telemedicine and  availability of in person appointments. and agreed to proceed.   HPI: Alicia [name] presents for video visit Chief Complaint  Patient presents with   Cough    Pt reports she had went to [facility] on Thursday night Jan. 2, 2025. Still having congestion and cough that seem to be worsen at night. Taking Benzonatate . Seems to help at night. Traveling on Jan. 15 to take care of brother with terminal cancer. Would like to get an antibiotic to get over the sx quicker. And If antibiotic could help her anyway when she is with brother.     Seen 1/ 2 for 4 days of st and then resp congestion dx as viral and no signs of complications . St is better  but cough congestion  on going without help  cough is dry  no more fever x at onset. Day 9-10  trying to get to [place] where brother has stage 4 meta cancer to cns from renal .   ROS: See pertinent positives and negatives per HPI.  Past Medical History:  Diagnosis Date   Allergy    Cataract    removed bilateral -2014   Chronic tension headaches    Hx of varicella    Hypertension    Kidney stones    Near syncope    Nephrolithiasis    history of  passed on own.    Palpitations    Thyroid  disease    pt. denies 06/11/19   Tubular adenoma of colon 02/1997   w/ HGD   Varicose veins     Past Surgical History:  Procedure Laterality Date   ABDOMINAL HYSTERECTOMY     fibroids non cancer    CATARACT EXTRACTION     Bilateral   cataract surgery Right 03/02/2012   CHOLECYSTECTOMY     KNEE SURGERY  04/2020   KNEE SURGERY      ROTATOR CUFF REPAIR Right    TONSILLECTOMY      Family History  Problem Relation Age of Onset   Cancer Mother        CERVICAL uterin cancer    Liver disease Mother        from chronic heaptitis    Diabetes Mother    Heart disease Father        avdisease avr   Stroke Father    Prostate cancer Father    Cancer Sister        Ovarian and endometrial   Cancer Maternal Aunt        Colon   Heart disease Maternal Grandfather    Heart disease Paternal Grandmother    Heart disease Paternal Grandfather    Diabetes Sister    Diabetes Brother    Colon cancer Paternal Aunt    Esophageal cancer Paternal Uncle    Rectal cancer Neg Hx    Stomach cancer Neg Hx     Social History   Tobacco Use   Smoking status: Never   Smokeless tobacco: Never  Vaping Use   Vaping status: Never Used  Substance Use Topics   Alcohol use: No   Drug  use: No      Current Outpatient Medications:    azithromycin  (ZITHROMAX ) 250 MG tablet, Take 2 tablets on day 1, then 1 tablet daily on days 2 through 5, Disp: 6 tablet, Rfl: 0   chlorthalidone  (HYGROTON ) 25 MG tablet, Take 1 tablet (25 mg total) by mouth daily., Disp: 90 tablet, Rfl: 3   Cholecalciferol (VITAMIN D3) 50 MCG (2000 UT) CHEW, Chew 2,000 Units by mouth daily., Disp: , Rfl:    Multiple Vitamin (MULTIVITAMIN WITH MINERALS) TABS tablet, Take 1 tablet by mouth daily. Women's formula, Disp: , Rfl:    colestipol  (COLESTID ) 1 g tablet, Take 1 tablet (1 g total) by mouth 2 (two) times daily. (Patient not taking: Reported on 01/24/2023), Disp: 60 tablet, Rfl: 5   spironolactone  (ALDACTONE ) 25 MG tablet, Take 0.5 tablets (12.5 mg total) by mouth daily. (Patient not taking: Reported on 01/24/2023), Disp: 45 tablet, Rfl: 3  EXAM: BP Readings from Last 3 Encounters:  01/24/23 135/89  11/17/22 136/82  06/21/22 138/80    VITALS per patient if applicable:  GENERAL: alert, oriented, appears well and in no acute distress some congestion  no resp distress  and no edema of face   HEENT: atraumatic, conjunttiva clear, no obvious abnormalities on inspection of external nose and ears  NECK: normal movements of the head and neck  LUNGS: on inspection no signs of respiratory distress, breathing rate appears normal, no obvious gross SOB, gasping or wheezing  CV: no obvious cyanosis  MS: moves all visible extremities without noticeable abnormality  PSYCH/NEURO: pleasant and cooperative, no obvious depression or anxiety, speech and thought processing grossly intact Lab Results  Component Value Date   WBC 5.5 11/17/2022   HGB 14.3 11/17/2022   HCT 42.8 11/17/2022   PLT 241 11/17/2022   GLUCOSE 104 (H) 11/17/2022   CHOL 201 (H) 12/01/2020   TRIG 128 12/01/2020   HDL 68 12/01/2020   LDLCALC 111 (H) 12/01/2020   ALT 19 11/17/2022   AST 20 11/17/2022   NA 144 11/17/2022   K 3.8 11/17/2022   CL 105 11/17/2022   CREATININE 0.64 11/17/2022   BUN 18 11/17/2022   CO2 26 11/17/2022   TSH 2.940 11/17/2022   INR 1.0 ratio 04/03/2009   HGBA1C 5.9 (H) 11/17/2022    ASSESSMENT AND PLAN:  Discussed the following assessment and plan:    ICD-10-CM   1. Upper respiratory infection with cough and congestion  J06.9     2. Cough, persistent  R05.3       Counseled.  Uncertain if antibiotic will be helpful  should not change infectivity   Risk benefit of antibiotic medication discussed.  Able to take z pack  other option was doxy.  Expectant management and discussion of plan and treatment with opportunity to ask questions and all were answered. The patient agreed with the plan and demonstrated an understanding of the instructions.   Advised to call back or seek an in-person evaluation if worsening  or having  further concerns  in interim. Return if alarm sx  as discussed.    Apolinar Eastern, MD

## 2023-01-24 NOTE — Telephone Encounter (Signed)
 Spoke to pt.   Pt reports she is still having congestion and cough that seem to be worsen at night. Cough is not productive.  Can't seem to get it off. Pt states she is taking Benzonate at night. She continues that it does help at night.  Pt denied fever, chills, sore throat. Pt states she has sinus headache off and on, taking tylenol . Also some body ache.   Pt states her concerns that she will be taking care of her brother who has a terminal cancer. Pt requesting antibiotic to help get over the cold faster.   Schedule a virtual visit with Dr. Charlett today at 3:45pm.

## 2023-01-26 NOTE — Telephone Encounter (Signed)
Had virtual visit  

## 2023-02-14 ENCOUNTER — Ambulatory Visit: Payer: Self-pay | Admitting: Internal Medicine

## 2023-02-14 NOTE — Telephone Encounter (Signed)
This RN made first attempt to triage. No answer, LVM. Will continue to attempt.   Copied from CRM [id number]. Topic: Clinical - Pink Word Triage >> Feb 14, 2023 10:34 AM Samuel Jester B wrote: Reason for Triage: Pt stated that she has been having a deep chest cough, and runny nose, and slight fever.

## 2023-02-14 NOTE — Telephone Encounter (Signed)
  Chief Complaint: chronic cough Symptoms: runny nose, non productive cough Frequency: 1 month Pertinent Negatives: Patient denies fever, SOB, chest pain Disposition: [] ED /[] Urgent Care (no appt availability in office) / [x] Appointment(In office/virtual)/ []  [facility]/ [] Home Care/ [] Refused Recommended Disposition /[] [facility]/ []  Follow-up with PCP Additional Notes: Patient reports chronic coughing x 1 month. Reports she went to UC earlier this month and was dx with viral URI. Still with persistent cough, especially at night. Denies fevers, SOB, chest pain. Scheduled patient per protocol on 02/16/2023. Patient verbalized understanding and to call back with worsening symptoms.     Reason for Disposition  Cough lasts > 3 weeks  Answer Assessment - Initial Assessment Questions 1. ONSET: "When did the cough begin?"      A month ago 2. SEVERITY: "How bad is the cough today?"      Worse at night 3. SPUTUM: "Describe the color of your sputum" (none, dry cough; clear, white, yellow, green)     none 4. HEMOPTYSIS: "Are you coughing up any blood?" If so ask: "How much?" (flecks, streaks, tablespoons, etc.)     no 5. DIFFICULTY BREATHING: "Are you having difficulty breathing?" If Yes, ask: "How bad is it?" (e.g., mild, moderate, severe)    - MILD: No SOB at rest, mild SOB with walking, speaks normally in sentences, can lie down, no retractions, pulse < 100.    - MODERATE: SOB at rest, SOB with minimal exertion and prefers to sit, cannot lie down flat, speaks in phrases, mild retractions, audible wheezing, pulse 100-120.    - SEVERE: Very SOB at rest, speaks in single words, struggling to breathe, sitting hunched forward, retractions, pulse > 120      no 6. FEVER: "Do you have a fever?" If Yes, ask: "What is your temperature, how was it measured, and when did it start?"     No, reports low grade around 99 7. CARDIAC HISTORY: "Do you have any history of heart disease?"  (e.g., heart attack, congestive heart failure)      HTN,  denies CHF, heart attack 8. LUNG HISTORY: "Do you have any history of lung disease?"  (e.g., pulmonary embolus, asthma, emphysema)     No, just PNA x 2 9. PE RISK FACTORS: "Do you have a history of blood clots?" (or: recent major surgery, recent prolonged travel, bedridden)     no 10. OTHER SYMPTOMS: "Do you have any other symptoms?" (e.g., runny nose, wheezing, chest pain)       Runny nose intermittent - improving  12. TRAVEL: "Have you traveled out of the country in the last month?" (e.g., travel history, exposures)       No  Protocols used: Cough - Acute Non-Productive-A-AH, Cough - Chronic-A-AH

## 2023-02-16 ENCOUNTER — Encounter: Payer: Self-pay | Admitting: Internal Medicine

## 2023-02-16 ENCOUNTER — Ambulatory Visit (INDEPENDENT_AMBULATORY_CARE_PROVIDER_SITE_OTHER): Payer: Medicare Other | Admitting: Internal Medicine

## 2023-02-16 VITALS — BP 136/84 | HR 57 | Temp 98.0°F | Ht 66.0 in | Wt 244.4 lb

## 2023-02-16 DIAGNOSIS — R053 Chronic cough: Secondary | ICD-10-CM | POA: Diagnosis not present

## 2023-02-16 DIAGNOSIS — J069 Acute upper respiratory infection, unspecified: Secondary | ICD-10-CM

## 2023-02-16 NOTE — Progress Notes (Signed)
Chief Complaint  Patient presents with   Cough    Pt reports she was sick at the beginning of the month. Still having cough. Feels cough is loosen up a bit, notice it yesterday. Taking benzonate and dylsum OTC. Still feeling tired. Runny nose, on and off.     HPI: Alicia [name] 68 y.o. come in for  fu of persistent cough and uri sx  See last notes and video visit .  Persistent runny nose cough  and just  starting to "turn the corner. " Cough looser no fever  clear phlegm . No sob or wheezing current new cp . ( Neg early covid screen x1) dose not feel like when she had pna in past ROS: See pertinent positives and negatives per HPI.  Past Medical History:  Diagnosis Date   Allergy    Cataract    removed bilateral -2014   Chronic tension headaches    Hx of varicella    Hypertension    Kidney stones    Near syncope    Nephrolithiasis    history of  passed on own.    Palpitations    Thyroid disease    pt. denies 06/11/19   Tubular adenoma of colon 02/1997   w/ HGD   Varicose veins     Family History  Problem Relation Age of Onset   Cancer Mother        CERVICAL uterin cancer    Liver disease Mother        from chronic heaptitis    Diabetes Mother    Heart disease Father        avdisease avr   Stroke Father    Prostate cancer Father    Cancer Sister        Ovarian and endometrial   Cancer Maternal Aunt        Colon   Heart disease Maternal Grandfather    Heart disease Paternal Grandmother    Heart disease Paternal Grandfather    Diabetes Sister    Diabetes Brother    Colon cancer Paternal Aunt    Esophageal cancer Paternal Uncle    Rectal cancer Neg Hx    Stomach cancer Neg Hx     Social History   Socioeconomic History   Marital status: Married    Spouse name: Not on file   Number of children: 3   Years of education: 16   Highest education level: Not on file  Occupational History   Occupation: Magazine features editor: [employer]   Tobacco Use   Smoking status: Never   Smokeless tobacco: Never  Vaping Use   Vaping status: Never Used  Substance and Sexual Activity   Alcohol use: No   Drug use: No   Sexual activity: Yes    Partners: Male  Other Topics Concern   Not on file  Social History Narrative   [employer]- BA. 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 2 daughters- '82, '93, 1 son- '87. 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   Lives with her husband   2 dogs   orig from [employer]   Currently ob FMLA for dad awaiting valve surgery   Social Drivers of Health   Financial Resource Strain: Low Risk  (04/20/2022)   Overall Financial Resource Strain (CARDIA)  Difficulty of Paying Living Expenses: Not hard at all  Food Insecurity: No Food Insecurity (04/20/2022)   Hunger Vital Sign    Worried About Running Out of Food in the Last Year: Never true    Ran Out of Food in the Last Year: Never true  Transportation Needs: No Transportation Needs (04/20/2022)   PRAPARE - Administrator, Civil Service (Medical): No    Lack of Transportation (Non-Medical): No  Physical Activity: Insufficiently Active (04/20/2022)   Exercise Vital Sign    Days of Exercise per Week: 2 days    Minutes of Exercise per Session: 60 min  Stress: No Stress Concern Present (04/20/2022)   [employer] of Occupational Health - Occupational Stress Questionnaire    Feeling of Stress : Not at all  Social Connections: Moderately Isolated (04/20/2022)   Social Connection and Isolation Panel [NHANES]    Frequency of Communication with Friends and Family: More than three times a week    Frequency of Social Gatherings with Friends and Family: More than three times a week    Attends Religious Services: Never    Database administrator or Organizations: No    Attends Banker Meetings: Never    Marital Status: Married     Outpatient Medications Prior to Visit  Medication Sig Dispense Refill   benzonatate (TESSALON) 200 MG capsule Take by mouth.     chlorthalidone (HYGROTON) 25 MG tablet Take 1 tablet (25 mg total) by mouth daily. 90 tablet 3   Cholecalciferol (VITAMIN D3) 50 MCG (2000 UT) CHEW Chew 2,000 Units by mouth daily.     colestipol (COLESTID) 1 g tablet Take 1 tablet (1 g total) by mouth 2 (two) times daily. 60 tablet 5   Multiple Vitamin (MULTIVITAMIN WITH MINERALS) TABS tablet Take 1 tablet by mouth daily. Women's formula     spironolactone (ALDACTONE) 25 MG tablet Take 0.5 tablets (12.5 mg total) by mouth daily. (Patient not taking: Reported on 02/16/2023) 45 tablet 3   No facility-administered medications prior to visit.     EXAM:  BP 136/84 (BP Location: Left Arm, Patient Position: Sitting, Cuff Size: Large)   Pulse (!) 57   Temp 98 F (36.7 C) (Oral)   Ht 5\' 6"  (1.676 m)   Wt 244 lb 6.4 oz (110.9 kg)   SpO2 97%   BMI 39.45 kg/m   Body mass index is 39.45 kg/m.  GENERAL: vitals reviewed and listed above, alert, oriented, appears well hydrated and in no acute distress HEENT: atraumatic, conjunctiva  clear, no obvious abnormalities on inspection of external nose and ears tmx clear  face non tender OP :masked  NECK: no obvious masses on inspection palpation  LUNGS: clear to auscultation bilaterally, no wheezes, rales or rhonchi, good air movement CV: HRRR, no clubbing cyanosis or  peripheral edema nl cap refill  MS: moves all extremities without noticeable focal  abnormality PSYCH: pleasant and cooperative, no obvious depression or anxiety Lab Results  Component Value Date   WBC 5.5 11/17/2022   HGB 14.3 11/17/2022   HCT 42.8 11/17/2022   PLT 241 11/17/2022   GLUCOSE 104 (H) 11/17/2022   CHOL 201 (H) 12/01/2020   TRIG 128 12/01/2020   HDL 68 12/01/2020   LDLCALC 111 (H) 12/01/2020   ALT 19 11/17/2022   AST 20 11/17/2022   NA 144 11/17/2022   K 3.8 11/17/2022   CL 105  11/17/2022   CREATININE 0.64 11/17/2022   BUN 18 11/17/2022  CO2 26 11/17/2022   TSH 2.940 11/17/2022   INR 1.0 ratio 04/03/2009   HGBA1C 5.9 (H) 11/17/2022   BP Readings from Last 3 Encounters:  02/16/23 136/84  01/24/23 135/89  11/17/22 136/82    ASSESSMENT AND PLAN:  Discussed the following assessment and plan:  Cough, persistent - prob post infectious cough residual - Plan: DG Chest 2 View  Upper respiratory infection with cough and congestion - Plan: DG Chest 2 View Prolonged resp sx initiated  by what sounds like infection . No sx of pna today . Had empiric  rx for atypicals .  No other alarm sx except length of  sx  Continue to follow and if not continuing to improve  after another 2-3 weeks then can get cxray and let us know ( order placed) but at this time  observation is all advised  -Patient advised to return or notify health care team  if  new concerns arise.  Patient Instructions  Exam is good today  and since you are feeling better  will observe and continue supportive plans.  If not a lot better in med February or worse  as discussed then contact us  and get chest x ray . I will put order in system . For 520 n  elam   x ray .     Neta Mends. Raiden Yearwood M.D.

## 2023-02-16 NOTE — Patient Instructions (Addendum)
Exam is good today  and since you are feeling better  will observe and continue supportive plans.  If not a lot better in med February or worse  as discussed then contact us  and get chest x ray . I will put order in system . For 520 n  elam   x ray .

## 2023-03-20 ENCOUNTER — Telehealth: Payer: Self-pay | Admitting: Physician Assistant

## 2023-03-20 ENCOUNTER — Telehealth: Payer: Self-pay

## 2023-03-20 NOTE — Telephone Encounter (Signed)
 Returned call to patient. Pt states that she "worked things out with the ED" that saw her and does not need our assistance at this time.

## 2023-03-20 NOTE — Telephone Encounter (Signed)
 Copied from CRM [id number]. Topic: Clinical - Medical Advice >> Mar 20, 2023 12:02 PM Danika B wrote: Reason for CRM: Patient called in requesting that Dr. Rosezella [place] nurse give her a call as she would like to speak with her regarding a recent ER visit. Made patient aware that we schedule hospital follow ups and that a message can be sent to the clinical team as well. Patient stated that she did not want to schedule just yet but prefers to speak with Dr. Rosezella [place] nurse beforehand. Stated that she had a severe bleeding episode and was treated in the ER, and a colonoscopy was suggested. Also stated that her GI doctor has retired. Callback [phone]

## 2023-03-20 NOTE — Telephone Encounter (Signed)
 Inbound call from patient stating she's been having a significant amount of blood in her stools. States she went to the ED on 3/1 and was advised she would need a colonoscopy as soon as possible. Patient is requesting a call to discuss further. Please advise, thank you.

## 2023-03-20 NOTE — Telephone Encounter (Signed)
 Attempted to reach out to pt at the provided contact number. Left a voicemail to call us back.

## 2023-03-23 NOTE — Telephone Encounter (Signed)
 Attempted to reach pt. Left a voicemail to call us back.

## 2023-03-30 NOTE — Telephone Encounter (Signed)
 Spoke to pt. Pt reports she has reach out to [facility] and they has sent a referral in for coloscopy. No one has reach to her yet to schedule the appt.  Schedule a follow up visit with Dr. Fabian Sharp on 04/18/2023.   Forwarding to provider for [facility]

## 2023-03-31 ENCOUNTER — Ambulatory Visit: Payer: Self-pay | Admitting: Internal Medicine

## 2023-03-31 NOTE — Telephone Encounter (Signed)
 Copied from CRM [id number]. Topic: Clinical - Red Word Triage >> Mar 31, 2023  2:34 PM Turkey A wrote: [facility] that prompted transfer to Nurse Triage: Patient states that she is dizziness, light headiness- Patient went to ER last week   Chief Complaint: Dizziness-Lightheadedness  Symptoms: Lightheadedness, Fatigue  Frequency: Acute, Yesterday.  Pertinent Negatives: Patient denies fever, chest pain, or dyspnea  Disposition: [] ED /[] Urgent Care (no appt availability in office) / [x] Appointment(In office/virtual)/ []  [facility]/ [] Home Care/ [] Refused Recommended Disposition /[] [facility]/ []  Follow-up with PCP  Additional Notes: RM is being triaged for dizziness and lightheadedness. Recently discharged from the ED on March 1st for rectal bleeding. The patient reports the dizziness and lightheadedness started yesterday. Scheduled patient per protocol on 04/03/2023.   Reason for Disposition  [1] MODERATE dizziness (e.g., interferes with normal activities) AND [2] has NOT been evaluated by doctor (or NP/PA) for this  (Exception: Dizziness caused by heat exposure, sudden standing, or poor fluid intake.)  Answer Assessment - Initial Assessment Questions 1. DESCRIPTION: "Describe your dizziness."     Lightheadedness  2. LIGHTHEADED: "Do you feel lightheaded?" (e.g., somewhat faint, woozy, weak upon standing)     Lightheadedness, with exertion  3. VERTIGO: "Do you feel like either you or the room is spinning or tilting?" (i.e. vertigo)     No  4. SEVERITY: "How bad is it?"  "Do you feel like you are going to faint?" "Can you stand and walk?"   - MILD: Feels slightly dizzy, but walking normally.   - MODERATE: Feels unsteady when walking, but not falling; interferes with normal activities (e.g., school, work).   - SEVERE: Unable to walk without falling, or requires assistance to walk without falling; feels like passing out now.      Mild  5. ONSET:  "When did the  dizziness begin?"     Yesterday  6. AGGRAVATING FACTORS: "Does anything make it worse?" (e.g., standing, change in head position)     Exertion  7. HEART RATE: "Can you tell me your heart rate?" "How many beats in 15 seconds?"  (Note: not all patients can do this)       No  8. CAUSE: "What do you think is causing the dizziness?"     Blood Volume  9. RECURRENT SYMPTOM: "Have you had dizziness before?" If Yes, ask: "When was the last time?" "What happened that time?"     Cannot recall.  10. OTHER SYMPTOMS: "Do you have any other symptoms?" (e.g., fever, chest pain, vomiting, diarrhea, bleeding)        No  11. PREGNANCY: "Is there any chance you are pregnant?" "When was your last menstrual period?"       No and No  Protocols used: Dizziness - Lightheadedness-A-AH

## 2023-04-03 ENCOUNTER — Ambulatory Visit: Admitting: Internal Medicine

## 2023-04-10 ENCOUNTER — Emergency Department (HOSPITAL_BASED_OUTPATIENT_CLINIC_OR_DEPARTMENT_OTHER)
Admission: EM | Admit: 2023-04-10 | Discharge: 2023-04-10 | Disposition: A | Discharge status: Home / Self Care | Attending: Emergency Medicine | Admitting: Emergency Medicine

## 2023-04-10 ENCOUNTER — Encounter (HOSPITAL_BASED_OUTPATIENT_CLINIC_OR_DEPARTMENT_OTHER): Payer: Self-pay | Admitting: Emergency Medicine

## 2023-04-10 ENCOUNTER — Emergency Department (HOSPITAL_BASED_OUTPATIENT_CLINIC_OR_DEPARTMENT_OTHER)

## 2023-04-10 ENCOUNTER — Other Ambulatory Visit: Payer: Self-pay

## 2023-04-10 DIAGNOSIS — R1031 Right lower quadrant pain: Secondary | ICD-10-CM | POA: Insufficient documentation

## 2023-04-10 LAB — COMPREHENSIVE METABOLIC PANEL
ALT: 16 U/L (ref 0–44)
AST: 21 U/L (ref 15–41)
Albumin: 4.2 g/dL (ref 3.5–5.0)
Alkaline Phosphatase: 61 U/L (ref 38–126)
Anion gap: 7 (ref 5–15)
BUN: 18 mg/dL (ref 8–23)
CO2: 29 mmol/L (ref 22–32)
Calcium: 8.8 mg/dL — ABNORMAL LOW (ref 8.9–10.3)
Chloride: 106 mmol/L (ref 98–111)
Creatinine, Ser: 0.6 mg/dL (ref 0.44–1.00)
GFR, Estimated: >60 mL/min (ref >60)
Glucose, Bld: 94 mg/dL (ref 70–99)
Potassium: 3.8 mmol/L (ref 3.5–5.1)
Sodium: 142 mmol/L (ref 135–145)
Total Bilirubin: 0.9 mg/dL (ref 0.0–1.2)
Total Protein: 6.9 g/dL (ref 6.5–8.1)

## 2023-04-10 LAB — CBC WITH DIFFERENTIAL/PLATELET
Abs Immature Granulocytes: 0.01 10*3/uL (ref 0.00–0.07)
Basophils Absolute: 0.1 10*3/uL (ref 0.0–0.1)
Basophils Relative: 1 %
Eosinophils Absolute: 0.2 10*3/uL (ref 0.0–0.5)
Eosinophils Relative: 4 %
HCT: 39.8 % (ref 36.0–46.0)
Hemoglobin: 13.4 g/dL (ref 12.0–15.0)
Immature Granulocytes: 0 %
Lymphocytes Relative: 23 %
Lymphs Abs: 1.5 10*3/uL (ref 0.7–4.0)
MCH: 31 pg (ref 26.0–34.0)
MCHC: 33.7 g/dL (ref 30.0–36.0)
MCV: 92.1 fL (ref 80.0–100.0)
Monocytes Absolute: 0.8 10*3/uL (ref 0.1–1.0)
Monocytes Relative: 13 %
Neutro Abs: 3.7 10*3/uL (ref 1.7–7.7)
Neutrophils Relative %: 59 %
Platelets: 208 10*3/uL (ref 150–400)
RBC: 4.32 MIL/uL (ref 3.87–5.11)
RDW: 12 % (ref 11.5–15.5)
WBC: 6.3 10*3/uL (ref 4.0–10.5)
nRBC: 0 % (ref 0.0–0.2)

## 2023-04-10 LAB — URINALYSIS, ROUTINE W REFLEX MICROSCOPIC
Bacteria, UA: NONE SEEN
Bilirubin Urine: NEGATIVE
Glucose, UA: NEGATIVE mg/dL
Hgb urine dipstick: NEGATIVE
Ketones, ur: NEGATIVE mg/dL
Nitrite: NEGATIVE
Specific Gravity, Urine: 1.032 — ABNORMAL HIGH (ref 1.005–1.030)
pH: 5.5 (ref 5.0–8.0)

## 2023-04-10 LAB — LIPASE, BLOOD: Lipase: 32 U/L (ref 11–51)

## 2023-04-10 NOTE — Discharge Instructions (Signed)
 Your workup appears reassuring.  Your kidney, liver, and pancreas labs were normal.  Your blood counts are normal.  Your electrolytes are normal aside from a slightly low calcium.  Please increase your dietary intake of calcium at home.  Your urine did not show any signs of a urinary tract infection.  Your CT scan did not show any acute abnormalities to explain your pain.  It did show the diverticulosis noted on your colonoscopy.  There was also some fat noted on your liver, we call this hepatic steatosis.   Please follow-up with your gastroenterologist regarding these findings and for your repeat colonoscopy.  I have included the CT scan results below for your reference.  You are also able to access this via your MyChart portal.  Return to the ER for any uncontrolled vomiting, fevers, any other new emergent concerns.  " Narrative & Impression  CLINICAL DATA:  Right lower quadrant pain and right flank pain, GI bleeding   EXAM: CT ABDOMEN AND PELVIS WITHOUT CONTRAST   TECHNIQUE: Multidetector CT imaging of the abdomen and pelvis was performed following the standard protocol without IV contrast.   RADIATION DOSE REDUCTION: This exam was performed according to the departmental dose-optimization program which includes automated exposure control, adjustment of the mA and/or kV according to patient size and/or use of iterative reconstruction technique.   COMPARISON:  09/09/2020   FINDINGS: Lower chest: No acute abnormality.   Hepatobiliary: Diffusely decreased attenuation of the hepatic parenchyma. No focal liver lesion is identified. Prior cholecystectomy. No biliary dilatation.   Pancreas: Unremarkable. No pancreatic ductal dilatation or surrounding inflammatory changes.   Spleen: Normal in size without focal abnormality.   Adrenals/Urinary Tract: Unremarkable adrenal glands. 5.5 cm right renal cyst which requires no follow-up imaging. Kidneys otherwise unremarkable. No renal  stone or hydronephrosis. No ureteral calculi. Urinary bladder is decompressed, limiting its evaluation.   Stomach/Bowel: Stomach within normal limits. No abnormally dilated loops of bowel. Extensive colonic diverticulosis. No focal bowel wall thickening or inflammatory changes are identified.   Vascular/Lymphatic: No significant vascular findings are present. No enlarged abdominal or pelvic lymph nodes.   Reproductive: Prior hysterectomy. Simple fluid attenuation 2.7 cm right adnexal cyst. No follow-up imaging recommended. Note: This recommendation does not apply to premenarchal patients and to those with increased risk (genetic, family history, elevated tumor markers or other high-risk factors) of ovarian cancer. Reference: JACR 2020 Feb; 17(2):248-254   Other: No free air or free fluid.   Musculoskeletal: No acute or significant osseous findings. Lower lumbar facet arthropathy.   IMPRESSION: 1. No acute abdominopelvic findings.  Normal appendix. 2. Extensive colonic diverticulosis without evidence of acute diverticulitis. 3. Hepatic steatosis.     Electronically Signed   By: Duanne Guess D.O.   On: 04/10/2023 11:52  "

## 2023-04-10 NOTE — ED Provider Notes (Signed)
 [name] EMERGENCY DEPARTMENT AT [facility] Provider Note   CSN: [id number] Arrival date & time: 04/10/23  4098     History  Chief Complaint  Patient presents with   Abdominal Pain    Alicia [name] is a 68 y.o. female with history of IBS, hemorrhoids, nephrolithiasis, presents with concern for right lower quadrant pain that has been ongoing for several months.  She reports that after her colonoscopy performed 4 days ago, the pain started spreading towards the right lower back.  She denies any fevers, chills, nausea, vomiting, or diarrhea.  She reports she only passed a small amount of stool this morning, not her normal full bowel movement.   Abdominal Pain      Home Medications Prior to Admission medications   Medication Sig Start Date End Date Taking? Authorizing Provider  benzonatate (TESSALON) 200 MG capsule Take by mouth. 01/19/23   [provider]  chlorthalidone (HYGROTON) 25 MG tablet Take 1 tablet (25 mg total) by mouth daily. 12/05/22   Pricilla Riffle, MD  Cholecalciferol (VITAMIN D3) 50 MCG (2000 UT) CHEW Chew 2,000 Units by mouth daily. 11/21/22   Pricilla Riffle, MD  colestipol (COLESTID) 1 g tablet Take 1 tablet (1 g total) by mouth 2 (two) times daily. 06/01/22   Unk Lightning, PA  Multiple Vitamin (MULTIVITAMIN WITH MINERALS) TABS tablet Take 1 tablet by mouth daily. Women's formula    [provider]  spironolactone (ALDACTONE) 25 MG tablet Take 0.5 tablets (12.5 mg total) by mouth daily. Patient not taking: Reported on 02/16/2023 11/21/22   Pricilla Riffle, MD      Allergies    Ciprofloxacin hcl, Iodinated contrast media, Iohexol, Lidocaine hcl, Morphine, Sulfa antibiotics, Prednisone, Valsartan, Shellfish allergy, Sulfur, Ciprofloxacin, Penicillins, Sulfamethoxazole-trimethoprim, and Sulfonamide derivatives    Review of Systems   Review of Systems  Gastrointestinal:  Positive for abdominal pain.    Physical Exam Updated  Vital Signs BP 125/80   Pulse (!) 56   Temp 98.5 F (36.9 C)   Resp 15   Ht 5\' 6"  (1.676 m)   Wt 108 kg   SpO2 96%   BMI 38.41 kg/m  Physical Exam Vitals and nursing note reviewed.  Constitutional:      General: She is not in acute distress.    Appearance: Normal appearance. She is well-developed.  HENT:     Head: Normocephalic and atraumatic.  Eyes:     Conjunctiva/sclera: Conjunctivae normal.  Cardiovascular:     Rate and Rhythm: Normal rate and regular rhythm.     Heart sounds: No murmur heard. Pulmonary:     Effort: Pulmonary effort is normal. No respiratory distress.     Breath sounds: Normal breath sounds.  Abdominal:     Palpations: Abdomen is soft.     Tenderness: There is no abdominal tenderness.     Comments: No CVA tenderness bilaterally  No abdominal tenderness to palpation  Musculoskeletal:        General: No swelling.     Cervical back: Neck supple.  Skin:    General: Skin is warm and dry.     Capillary Refill: Capillary refill takes less than 2 seconds.  Neurological:     General: No focal deficit present.     Mental Status: She is alert.  Psychiatric:        Mood and Affect: Mood normal.        Behavior: Behavior normal.     ED Results / Procedures /  Treatments   Labs (all labs ordered are listed, but only abnormal results are displayed) Labs Reviewed  COMPREHENSIVE METABOLIC PANEL - Abnormal; Notable for the following components:      Result Value   Calcium 8.8 (*)    All other components within normal limits  URINALYSIS, ROUTINE W REFLEX MICROSCOPIC - Abnormal; Notable for the following components:   Specific Gravity, Urine 1.032 (*)    Protein, ur TRACE (*)    Leukocytes,Ua SMALL (*)    All other components within normal limits  CBC WITH DIFFERENTIAL/PLATELET  LIPASE, BLOOD    EKG None  Radiology CT ABDOMEN PELVIS WO CONTRAST Result Date: 04/10/2023 CLINICAL DATA:  Right lower quadrant pain and right flank pain, GI bleeding EXAM:  CT ABDOMEN AND PELVIS WITHOUT CONTRAST TECHNIQUE: Multidetector CT imaging of the abdomen and pelvis was performed following the standard protocol without IV contrast. RADIATION DOSE REDUCTION: This exam was performed according to the departmental dose-optimization program which includes automated exposure control, adjustment of the mA and/or kV according to patient size and/or use of iterative reconstruction technique. COMPARISON:  09/09/2020 FINDINGS: Lower chest: No acute abnormality. Hepatobiliary: Diffusely decreased attenuation of the hepatic parenchyma. No focal liver lesion is identified. Prior cholecystectomy. No biliary dilatation. Pancreas: Unremarkable. No pancreatic ductal dilatation or surrounding inflammatory changes. Spleen: Normal in size without focal abnormality. Adrenals/Urinary Tract: Unremarkable adrenal glands. 5.5 cm right renal cyst which requires no follow-up imaging. Kidneys otherwise unremarkable. No renal stone or hydronephrosis. No ureteral calculi. Urinary bladder is decompressed, limiting its evaluation. Stomach/Bowel: Stomach within normal limits. No abnormally dilated loops of bowel. Extensive colonic diverticulosis. No focal bowel wall thickening or inflammatory changes are identified. Vascular/Lymphatic: No significant vascular findings are present. No enlarged abdominal or pelvic lymph nodes. Reproductive: Prior hysterectomy. Simple fluid attenuation 2.7 cm right adnexal cyst. No follow-up imaging recommended. Note: This recommendation does not apply to premenarchal patients and to those with increased risk (genetic, family history, elevated tumor markers or other high-risk factors) of ovarian cancer. Reference: JACR 2020 Feb; 17(2):248-254 Other: No free air or free fluid. Musculoskeletal: No acute or significant osseous findings. Lower lumbar facet arthropathy. IMPRESSION: 1. No acute abdominopelvic findings.  Normal appendix. 2. Extensive colonic diverticulosis without  evidence of acute diverticulitis. 3. Hepatic steatosis. Electronically Signed   By: Duanne Guess D.O.   On: 04/10/2023 11:52    Procedures Procedures    Medications Ordered in ED Medications - No data to display  ED Course/ Medical Decision Making/ A&P                                 Medical Decision Making Amount and/or Complexity of Data Reviewed Labs: ordered. Radiology: ordered.     Differential diagnosis includes but is not limited to Cholelithiasis, cholangitis, choledocholithiasis, peptic ulcer, gastritis, gastroenteritis, appendicitis, IBS, IBD, DKA, nephrolithiasis, UTI, pyelonephritis, pancreatitis, diverticulitis, mesenteric ischemia, abdominal aortic aneurysm, small bowel obstruction, volvulus   ED Course:  Patient very well-appearing, stable vital signs aside from a slightly elevated blood pressure 156/83 upon arrival.  She reports concern for chronic right lower quadrant pain, ongoing for several weeks.  Abdomen is soft and nontender on my exam.  No CVA tenderness bilaterally.   I Ordered, and personally interpreted labs.  The pertinent results include:   CBC without leukocytosis, no abnormalities CMP with slight hypocalcemia at 8.8, otherwise unremarkable Urinalysis with high specific gravity, no signs of UTI Lipase within normal  limits Given recent colonoscopy, CT scan was ordered to evaluate for any acute abdominal pathology.  This was unremarkable. Low concern for any acute intra-abdominal pathology given this pain has been ongoing for a while, and given unremarkable labs and unremarkable CT imaging.   Given patient has IBS, discussed that we could try a short course of prednisone or Bentyl to help with abdominal pain.  She declines at this time and will follow-up with her GI doctor.  Upon review of their colonoscopy note, it appears that they recommended a follow-up colonoscopy given there was still stool in the colon.  I discussed this with the patient, and  she understands she needs to reach out to them to schedule this follow-up colonoscopy.  Patient is stable and appropriate for discharge home at this time  Impression: Right lower abdominal pain of unknown etiology  Disposition:  The patient was discharged home with instructions to follow-up with her [facility] gastroenterologist as soon as possible for further evaluation and management.  She will need to get repeat colonoscopy done with them per their recommendations. Return precautions given.  Imaging Studies ordered: I ordered imaging studies including CT abdomen and pelvis I independently visualized the imaging with scope of interpretation limited to determining acute life threatening conditions related to emergency care. Imaging showed no acute abnormalities I agree with the radiologist interpretation     External records from outside source obtained and reviewed including [facility] gastroenterology colonoscopy note from 04/07/2023              Final Clinical Impression(s) / ED Diagnoses Final diagnoses:  Right lower quadrant pain    Rx / DC Orders ED Discharge Orders     None         Arabella Merles, Cordelia Poche 04/10/23 1226    Ernie Avena, MD 04/10/23 1315

## 2023-04-10 NOTE — ED Triage Notes (Signed)
 Pt caox4, ambulatory c/o RLQ abd pain radiating to R flank that has been ongoing for months, had episodes of rectal bleeding for a few days which subsided, was referred to GI for colonoscopy which was done on Friday. Pt reports pain has become increasingly worse since. Pt denies N/V/D, fever.

## 2023-04-18 ENCOUNTER — Ambulatory Visit (INDEPENDENT_AMBULATORY_CARE_PROVIDER_SITE_OTHER): Admitting: Internal Medicine

## 2023-04-18 ENCOUNTER — Encounter: Payer: Self-pay | Admitting: Internal Medicine

## 2023-04-18 VITALS — BP 132/82 | HR 60 | Temp 98.2°F | Ht 66.0 in | Wt 243.8 lb

## 2023-04-18 DIAGNOSIS — Z8719 Personal history of other diseases of the digestive system: Secondary | ICD-10-CM

## 2023-04-18 DIAGNOSIS — Z809 Family history of malignant neoplasm, unspecified: Secondary | ICD-10-CM

## 2023-04-18 DIAGNOSIS — R109 Unspecified abdominal pain: Secondary | ICD-10-CM | POA: Diagnosis not present

## 2023-04-18 DIAGNOSIS — N281 Cyst of kidney, acquired: Secondary | ICD-10-CM

## 2023-04-18 NOTE — Progress Notes (Signed)
 Chief Complaint  Patient presents with   Follow-up    Pt is follow up on ER visit. Pt reports initial blood in stool started on Feb. 28 - getting ready for daughter wedding- had bleeding bowel movement. Two episodes, then one the next day. Went to [facility] ER on March 1. Was informed Hemoglobin dropped. Referral GI for colonoscopy. Done at [facility] 3/21. Pt states she needs a repeat on 04/27/2023. Went on 3/24- due to stomach pain on Right side and navel area.     HPI: Alicia [name] 68 y.o. come in for Fu ed visit  for abd pain  :ct scan done and lab  unrevealing  and was to fu GI  See above  because of hx of rectal bleeding seen prev in [facility] ed . Today doing ok  wondering about a nutritional referral.  Had  dark mixed in stool    2  and then again.  Was help in Ed and hg followed  Referred to Gi  new gi and had colon 21   2 polyps removed  benign  and to repeat  cause of severe diverticulitis on April 10  no more bleeding since last check .  R side pain has been off and on for a while and  in emote past reminiscent of renal stone . But no hematuria or severe pain . Had right   renal and adnexal cyst  felt benign bu radiologist  ROS: See pertinent positives and negatives per HPI.  Past Medical History:  Diagnosis Date   Allergy    Cataract    removed bilateral -2014   Chronic tension headaches    Hx of varicella    Hypertension    Kidney stones    Near syncope    Nephrolithiasis    history of  passed on own.    Palpitations    Thyroid disease    pt. denies 06/11/19   Tubular adenoma of colon 02/1997   w/ HGD   Varicose veins     Family History  Problem Relation Age of Onset   Cancer Mother        CERVICAL uterin cancer    Liver disease Mother        from chronic heaptitis    Diabetes Mother    Heart disease Father        avdisease avr   Stroke Father    Prostate cancer Father    Cancer Sister        Ovarian and endometrial   Cancer Maternal Aunt        Colon   Heart  disease Maternal Grandfather    Heart disease Paternal Grandmother    Heart disease Paternal Grandfather    Diabetes Sister    Diabetes Brother    Colon cancer Paternal Aunt    Esophageal cancer Paternal Uncle    Rectal cancer Neg Hx    Stomach cancer Neg Hx     Social History   Socioeconomic History   Marital status: Married    Spouse name: Not on file   Number of children: 3   Years of education: 16   Highest education level: Not on file  Occupational History   Occupation: Magazine features editor: [employer]  Tobacco Use   Smoking status: Never   Smokeless tobacco: Never  Vaping Use   Vaping status: Never Used  Substance and Sexual Activity   Alcohol use: No   Drug use: No  Sexual activity: Yes    Partners: Male  Other Topics Concern   Not on file  Social History Narrative   [employer]- BA. 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 2 daughters- '82, '93, 1 son- '87. 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   Overall Financial Resource Strain (CARDIA)    Difficulty of Paying Living Expenses: Not hard at all  Food Insecurity: No Food Insecurity (03/19/2023)   Received from [facility]   Hunger Vital Sign    Worried About Running Out of Food in the Last Year: Never true    Ran Out of Food in the Last Year: Never true  Transportation Needs: Unknown (03/19/2023)   Received from [facility] - Transportation    In the past 12 months, has lack of transportation kept you from medical appointments or from getting medications?: No    Lack of Transportation (Non-Medical): Not on file   Physical Activity: Insufficiently Active (04/20/2022)   Exercise Vital Sign    Days of Exercise per Week: 2 days    Minutes of Exercise per Session: 60 min  Stress: No Stress Concern Present (04/20/2022)   [employer] of Occupational Health - Occupational Stress Questionnaire    Feeling of Stress : Not at all  Social Connections: Moderately Isolated (04/20/2022)   Social Connection and Isolation Panel [NHANES]    Frequency of Communication with Friends and Family: More than three times a week    Frequency of Social Gatherings with Friends and Family: More than three times a week    Attends Religious Services: Never    Database administrator or Organizations: No    Attends Banker Meetings: Never    Marital Status: Married    Outpatient Medications Prior to Visit  Medication Sig Dispense Refill   chlorthalidone (HYGROTON) 25 MG tablet Take 1 tablet (25 mg total) by mouth daily. 90 tablet 3   Cholecalciferol (VITAMIN D3) 50 MCG (2000 UT) CHEW Chew 2,000 Units by mouth daily.     Multiple Vitamin (MULTIVITAMIN WITH MINERALS) TABS tablet Take 1 tablet by mouth daily. Women's formula     spironolactone (ALDACTONE) 25 MG tablet Take 0.5 tablets (12.5 mg total) by mouth daily. 45 tablet 3   benzonatate (TESSALON) 200 MG capsule Take by mouth.     colestipol (COLESTID) 1 g tablet Take 1 tablet (1 g total) by mouth 2 (two) times daily. (Patient not taking: Reported on 04/18/2023) 60 tablet 5   No facility-administered medications prior to visit.     EXAM:  BP 132/82 (BP Location: Left Arm, Patient Position: Sitting, Cuff Size: Large)   Pulse 60   Temp 98.2 F (36.8 C) (Oral)   Ht 5\' 6"  (1.676 m)   Wt 243 lb 12.8 oz (110.6 kg)   SpO2 98%   BMI 39.35 kg/m   Body mass index is 39.35 kg/m.  GENERAL: vitals reviewed and listed above, alert, oriented, appears well hydrated and in no acute distress HEENT: atraumatic, conjunctiva  clear, no obvious  abnormalities on  inspection of external nose and ears  NECK: no obvious masses on inspection palpation  LUNGS: clear to auscultation bilaterally, no wheezes, rales or rhonchi, good air movement CV: HRRR, no clubbing cyanosis or  peripheral edema nl cap refill  Abd soft area of concern r lateral abd flank  no pelvic pain or rebound  MS: moves all extremities without noticeable focal  abnormality No bruising or petechia  PSYCH: pleasant and cooperative, no obvious depression or anxiety Lab Results  Component Value Date   WBC 6.3 04/10/2023   HGB 13.4 04/10/2023   HCT 39.8 04/10/2023   PLT 208 04/10/2023   GLUCOSE 94 04/10/2023   CHOL 201 (H) 12/01/2020   TRIG 128 12/01/2020   HDL 68 12/01/2020   LDLCALC 111 (H) 12/01/2020   ALT 16 04/10/2023   AST 21 04/10/2023   NA 142 04/10/2023   K 3.8 04/10/2023   CL 106 04/10/2023   CREATININE 0.60 04/10/2023   BUN 18 04/10/2023   CO2 29 04/10/2023   TSH 2.940 11/17/2022   INR 1.0 ratio 04/03/2009   HGBA1C 5.9 (H) 11/17/2022   BP Readings from Last 3 Encounters:  04/18/23 132/82  04/10/23 125/80  02/16/23 136/84  Ct abd review   :R adexal cyst  no fu needed   renal cyst   no fu needed nl appendix  hepatic steatosis  no diverticulitis  Record review  ASSESSMENT AND PLAN:  Discussed the following assessment and plan:  Right flank pain  History of rectal bleeding  Family history of cancer  Renal cyst, right His of significant  gi bleeding   stopped current  and needs fu colon    interested  in diet counseling to  prevent.P Discomfort predates bleed  location  is a good area ofr renal kidney stone pain and has hx of same however no confirm for this   interesting that renal and ovarian cyst both on right side area  Family hx of renal cancer and ovarian cancer and brain cancer   To proceed with fu colon.    Ask the gi team about  nutrition advice and if we need can do a referral after . As appropriate.  Record revewi  2 ed visits ct scans data and   discussion and exam plan  32 minutes  -Patient advised to return or notify health care team  if  new concerns arise.  Patient Instructions  Let us know if we need to do a specific  referral after GI evaluation. Hemoglobin was stable  normal last week .  Bp is  ok today . Marland Kitchen   Neta Mends. Chenay Nesmith M.D.

## 2023-04-18 NOTE — Patient Instructions (Addendum)
 Let us know if we need to do a specific  referral after GI evaluation. Hemoglobin was stable  normal last week .  Bp is  ok today . Marland Kitchen

## 2023-05-03 ENCOUNTER — Telehealth: Payer: Self-pay

## 2023-05-03 NOTE — Telephone Encounter (Signed)
Unsuccessful attempt to reach patient on preferred number listed in notes for scheduled AWV. Left message on voicemail okay to reschedule. 

## 2023-05-03 NOTE — Progress Notes (Signed)
 Subjective:   Alicia [name] is a 68 y.o. who presents for a Medicare Wellness preventive visit.  Visit Complete:     Persons Participating in Visit:   AWV Questionnaire:         Objective:    There were no vitals filed for this visit. There is no height or weight on file to calculate BMI.     04/10/2023    9:17 AM 04/20/2022   11:19 AM 11/02/2021    9:52 AM 08/19/2020   10:04 AM 01/02/2017   12:18 PM 03/03/2015    3:56 PM 09/01/2014   12:52 PM  Advanced Directives  Does Patient Have a Medical Advance Directive? No No No No No No No  Would patient like information on creating a medical advance directive? No - Patient declined No - Patient declined No - Patient declined  No - Patient declined Yes - Educational materials given No - patient declined information    Current Medications (verified) Outpatient Encounter Medications as of 05/03/2023  Medication Sig   benzonatate  (TESSALON ) 200 MG capsule Take by mouth.   chlorthalidone  (HYGROTON ) 25 MG tablet Take 1 tablet (25 mg total) by mouth daily.   Cholecalciferol (VITAMIN D3) 50 MCG (2000 UT) CHEW Chew 2,000 Units by mouth daily.   colestipol  (COLESTID ) 1 g tablet Take 1 tablet (1 g total) by mouth 2 (two) times daily. (Patient not taking: Reported on 04/18/2023)   Multiple Vitamin (MULTIVITAMIN WITH MINERALS) TABS tablet Take 1 tablet by mouth daily. Women's formula   spironolactone  (ALDACTONE ) 25 MG tablet Take 0.5 tablets (12.5 mg total) by mouth daily.   No facility-administered encounter medications on file as of 05/03/2023.    Allergies (verified) Ciprofloxacin  hcl, Iodinated contrast media, Iohexol, Lidocaine hcl, Morphine, Sulfa antibiotics, Prednisone , Valsartan , Shellfish allergy, Sulfur, Ciprofloxacin , Penicillins, Sulfamethoxazole-trimethoprim, and Sulfonamide derivatives   History: Past Medical History:  Diagnosis Date   Allergy    Cataract    removed bilateral -2014   Chronic tension headaches    Hx  of varicella    Hypertension    Kidney stones    Near syncope    Nephrolithiasis    history of  passed on own.    Palpitations    Thyroid  disease    pt. denies 06/11/19   Tubular adenoma of colon 02/1997   w/ HGD   Varicose veins    Past Surgical History:  Procedure Laterality Date   ABDOMINAL HYSTERECTOMY     fibroids non cancer    CATARACT EXTRACTION     Bilateral   cataract surgery Right 03/02/2012   CHOLECYSTECTOMY     KNEE SURGERY  04/2020   KNEE SURGERY     ROTATOR CUFF REPAIR Right    TONSILLECTOMY     Family History  Problem Relation Age of Onset   Cancer Mother        CERVICAL uterin cancer    Liver disease Mother        from chronic heaptitis    Diabetes Mother    Heart disease Father        avdisease avr   Stroke Father    Prostate cancer Father    Cancer Sister        Ovarian and endometrial   Cancer Maternal Aunt        Colon   Heart disease Maternal Grandfather    Heart disease Paternal Grandmother    Heart disease Paternal Grandfather    Diabetes Sister    Diabetes  Brother    Colon cancer Paternal Aunt    Esophageal cancer Paternal Uncle    Rectal cancer Neg Hx    Stomach cancer Neg Hx    Social History   Socioeconomic History   Marital status: Married    Spouse name: Not on file   Number of children: 3   Years of education: 16   Highest education level: Not on file  Occupational History   Occupation: Magazine features editor: [employer]  Tobacco Use   Smoking status: Never   Smokeless tobacco: Never  Vaping Use   Vaping status: Never Used  Substance and Sexual Activity   Alcohol use: No   Drug use: No   Sexual activity: Yes    Partners: Male  Other Topics Concern   Not on file  Social History Narrative   [employer]- BA. 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 2 daughters- '82, '93, 1 son- '87. 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   Overall Financial Resource Strain (CARDIA)    Difficulty of Paying Living Expenses: Not hard at all  Food Insecurity: No Food Insecurity (03/19/2023)   Received from [facility]   Hunger Vital Sign    Worried About Running Out of Food in the Last Year: Never true    Ran Out of Food in the Last Year: Never true  Transportation Needs: Unknown (03/19/2023)   Received from [facility] - Transportation    In the past 12 months, has lack of transportation kept you from medical appointments or from getting medications?: No    Lack of Transportation (Non-Medical): Not on file  Physical Activity: Insufficiently Active (04/20/2022)   Exercise Vital Sign    Days of Exercise per Week: 2 days    Minutes of Exercise per Session: 60 min  Stress: No Stress Concern Present (04/20/2022)   [employer] of Occupational Health - Occupational Stress Questionnaire    Feeling of Stress : Not at all  Social Connections: Moderately Isolated (04/20/2022)   Social Connection and Isolation Panel [NHANES]    Frequency of Communication with Friends and Family: More than three times a week    Frequency of Social Gatherings with Friends and Family: More than three times a week    Attends Religious Services: Never    Database administrator or Organizations: No    Attends Banker Meetings: Never    Marital Status: Married    Tobacco Counseling Counseling given: Not Answered    Clinical Intake:              Lab Results  Component Value Date   HGBA1C 5.9 (H) 11/17/2022   HGBA1C 6.1 (H) 12/01/2020   HGBA1C 6.2 (H) 02/15/2019               Activities of Daily Living      No data to  display          Patient Care Team: Reginal Capra, MD as PCP - General (  Internal Medicine) Elmyra Haggard, MD as PCP - Cardiology (Cardiology) Early, Sherre Docker, MD (Inactive) (Vascular Surgery) Rogene Claude, MD as Consulting Physician (Obstetrics and Gynecology) Alvan Jews, MD as Consulting Physician (Ophthalmology) Wittstein, Jocelyn, MD (Orthopedic Surgery)  Indicate any recent Medical Services you may have received from other than [facility] providers in the past year (date may be approximate).     Assessment:   This is a routine wellness examination for Alicia [name].  Hearing/Vision screen No results found.   Goals Addressed   None    Depression Screen     04/20/2022   11:18 AM 03/18/2022    8:44 AM 03/03/2015    3:56 PM  PHQ 2/9 Scores  PHQ - 2 Score 0 0 0  PHQ- 9 Score 0 2     Fall Risk     04/20/2022   11:19 AM 03/18/2022    8:44 AM 03/03/2015    3:56 PM  Fall Risk   Falls in the past year? 0 0 Yes  Number falls in past yr: 0 0 1  Injury with Fall? 0 0 Yes  Comment   injured knee and back after tripping over vacuum cleaner  Risk for fall due to : No Fall Risks No Fall Risks Other (Comment)  Risk for fall due to: Comment   unknown  Follow up Falls prevention discussed Falls evaluation completed Falls prevention discussed    MEDICARE RISK AT HOME:     TIMED UP AND GO:  Was the test performed?    Cognitive Function:         04/20/2022   11:20 AM  6CIT Screen  What Year? 0 points  What month? 0 points  What time? 0 points  Count back from 20 0 points  Months in reverse 0 points  Repeat phrase 0 points  Total Score 0 points    Immunizations Immunization History  Administered Date(s) Administered   Influenza Whole 10/02/2009   PFIZER(Purple Top)SARS-COV-2 Vaccination 03/16/2019, 04/06/2019   Td 02/18/2003    Screening Tests Health Maintenance  Topic Date Due   Zoster Vaccines- Shingrix (1 of 2) Never done   Pneumonia Vaccine 107+ Years old (1 of 1  - PCV) Never done   DTaP/Tdap/Td (2 - Tdap) 02/17/2013   COVID-19 Vaccine (3 - Pfizer risk series) 05/04/2019   MAMMOGRAM  08/16/2020   DEXA SCAN  Never done   Medicare Annual Wellness (AWV)  04/20/2023   INFLUENZA VACCINE  08/18/2023   Colonoscopy  04/27/2030   Hepatitis C Screening  Completed   HPV VACCINES  Aged Out   Meningococcal B Vaccine  Aged Out    Health Maintenance  Health Maintenance Due  Topic Date Due   Zoster Vaccines- Shingrix (1 of 2) Never done   Pneumonia Vaccine 44+ Years old (1 of 1 - PCV) Never done   DTaP/Tdap/Td (2 - Tdap) 02/17/2013   COVID-19 Vaccine (3 - Pfizer risk series) 05/04/2019   MAMMOGRAM  08/16/2020   DEXA SCAN  Never done   Medicare Annual Wellness (AWV)  04/20/2023   Health Maintenance Items Addressed:   Additional Screening:  Vision Screening: Recommended annual ophthalmology exams for early detection of glaucoma and other disorders of the eye.  Dental Screening: Recommended annual dental exams for proper oral hygiene  Community Resource Referral / Chronic Care Management: CRR required this visit?    CCM required this visit?       Plan:     I have personally reviewed and noted  the following in the patient's chart:   Medical and social history Use of alcohol, tobacco or illicit drugs  Current medications and supplements including opioid prescriptions.  Functional ability and status Nutritional status Physical activity Advanced directives List of other physicians Hospitalizations, surgeries, and ER visits in previous 12 months Vitals Screenings to include cognitive, depression, and falls Referrals and appointments  In addition, I have reviewed and discussed with patient certain preventive protocols, quality metrics, and best practice recommendations. A written personalized care plan for preventive services as well as general preventive health recommendations were provided to patient.     Dewayne Ford,  LPN   2/95/2841   After Visit Summary:   Notes: This encounter was created in error - please disregard. Patient was a no show.

## 2023-05-04 NOTE — Progress Notes (Signed)
 Hi, This patient was a no show and a note was placed on chart.

## 2023-05-08 NOTE — Progress Notes (Signed)
 Hi, this patient was a no show, noted in chart.

## 2023-05-18 ENCOUNTER — Encounter: Payer: Self-pay | Admitting: Internal Medicine

## 2023-05-18 DIAGNOSIS — K589 Irritable bowel syndrome without diarrhea: Secondary | ICD-10-CM

## 2023-07-10 ENCOUNTER — Telehealth: Payer: Self-pay | Admitting: Internal Medicine

## 2023-07-10 NOTE — Telephone Encounter (Unsigned)
 Copied from CRM [id number]. Topic: Referral - Request for Referral >> Jul 10, 2023  2:32 PM Gennette ORN wrote: Did the patient discuss referral with their provider in the last year? No (If No - schedule appointment) (If Yes - send message)  Appointment offered? Yes  Type of order/referral and detailed reason for visit: Physical Therapy and for her knee it caused a lot of inflammation and stiffness   Preference of office, provider, location: they just want to stay around the [place] area   If referral order, have you been seen by this specialty before? No (If Yes, this issue or another issue? When? Where?  Can we respond through MyChart? Yes

## 2023-07-11 NOTE — Telephone Encounter (Signed)
 Left a voicemail to pt that we are trying to reach out to [facility] to see if they can give VO for PT. Will try again.

## 2023-07-11 NOTE — Telephone Encounter (Signed)
 Record shows she has seen  5 25  ortho for   s/p knee surgery  and concerns   Should they be the ones to order PT?   If it is the same knee as a problem  Even if local?

## 2023-07-11 NOTE — Telephone Encounter (Signed)
 Attempted to reach Ortho office. Unable to reach pt. Will try again.

## 2023-07-11 NOTE — Telephone Encounter (Signed)
 Correction: unable to reach office. Will try again.

## 2023-07-12 NOTE — Telephone Encounter (Signed)
 Attempted to [facility] at [phone]. Left  a voicemail to call us  back.

## 2023-07-12 NOTE — Telephone Encounter (Signed)
 Attempted to [facility]. Left a voicemail to call us  back.

## 2023-07-12 NOTE — Telephone Encounter (Signed)
 Spoke to pt and inform pt the referral for PT would prefer to be by her Ortho provider as she was seen with them recently for the issues. Pt verbalized understanding.   Also inform pt, we have been trying to reach to her Ortho office with no success. Pt provide a contact number - [phone]. Ask pt to call her ortho also. And inform pt that this cma will try again to reach to them.    Attempted to reach [facility] at above number. Call was sent to voicemail.

## 2023-07-14 ENCOUNTER — Other Ambulatory Visit: Payer: Self-pay

## 2023-07-14 ENCOUNTER — Ambulatory Visit: Attending: Student

## 2023-07-14 DIAGNOSIS — G8929 Other chronic pain: Secondary | ICD-10-CM | POA: Insufficient documentation

## 2023-07-14 DIAGNOSIS — M25561 Pain in right knee: Secondary | ICD-10-CM | POA: Insufficient documentation

## 2023-07-14 DIAGNOSIS — M25661 Stiffness of right knee, not elsewhere classified: Secondary | ICD-10-CM | POA: Diagnosis present

## 2023-07-14 DIAGNOSIS — R262 Difficulty in walking, not elsewhere classified: Secondary | ICD-10-CM | POA: Insufficient documentation

## 2023-07-14 DIAGNOSIS — M6281 Muscle weakness (generalized): Secondary | ICD-10-CM | POA: Diagnosis present

## 2023-07-14 DIAGNOSIS — R252 Cramp and spasm: Secondary | ICD-10-CM | POA: Insufficient documentation

## 2023-07-14 NOTE — Therapy (Unsigned)
 OUTPATIENT PHYSICAL THERAPY LOWER EXTREMITY EVALUATION   Patient Name: Alicia [name] MRN: [id number] DOB:1955/06/09, 68 y.o., female Today's Date: 07/15/2023  END OF SESSION:    Past Medical History:  Diagnosis Date   Allergy    Cataract    removed bilateral -2014   Chronic tension headaches    Hx of varicella    Hypertension    Kidney stones    Near syncope    Nephrolithiasis    history of  passed on own.    Palpitations    Thyroid  disease    pt. denies 06/11/19   Tubular adenoma of colon 02/1997   w/ HGD   Varicose veins    Past Surgical History:  Procedure Laterality Date   ABDOMINAL HYSTERECTOMY     fibroids non cancer    CATARACT EXTRACTION     Bilateral   cataract surgery Right 03/02/2012   CHOLECYSTECTOMY     KNEE SURGERY  04/2020   KNEE SURGERY     ROTATOR CUFF REPAIR Right    TONSILLECTOMY     Patient Active Problem List   Diagnosis Date Noted   Hypokalemia 01/25/2017   Thyroid  disease    Palpitations    Nephrolithiasis    Near syncope    Kidney stones    Hypertension    Hx of varicella    Chronic tension headaches    Allergy    Nocturnal headaches 05/04/2016   Coccygodynia 01/29/2015   Atypical chest pain 09/09/2014   Essential hypertension 09/09/2014   Midline low back pain without sciatica 09/05/2013   Renal cyst, right 09/05/2013   Fatty infiltration of liver 09/05/2013   Umbilical hernia without obstruction and without gangrene 09/05/2013   Flank pain 07/29/2013   Encounter to establish care with new doctor 07/11/2013   Left leg pain 07/11/2013   Cough 01/22/2013   Swelling in head/neck 10/31/2012   Routine health maintenance 04/05/2012   Vitamin D  deficiency 04/04/2012   Varicose veins of bilateral lower extremities with other complications 09/02/2011   Chronic venous insufficiency 09/02/2011   Allergic rhinitis 06/08/2011   Paresthesia of left arm 06/08/2011   Essential hypertension, benign 03/26/2010   OBESITY, CLASS III  10/02/2009   Other and unspecified coagulation defects 04/03/2009   PERIPHERAL EDEMA 10/22/2008   HEMORRHOIDS, INTERNAL, WITH BLEEDING 05/07/2008   NEPHROLITHIASIS, HX OF 04/28/2008   SYNCOPE 04/19/2007   ANXIETY DEPRESSION 10/31/2006   Acquired absence of genital organ 10/31/2006    PCP: Charlett Apolinar POUR, MD  REFERRING PROVIDER: Cheryll Fausto Nasuti, MD  REFERRING DIAG: knee replacement 1.5 year ago  THERAPY DIAG:  Chronic pain of right knee - Plan: PT plan of care cert/re-cert  Muscle weakness (generalized) - Plan: PT plan of care cert/re-cert  Stiffness of right knee, not elsewhere classified - Plan: PT plan of care cert/re-cert  Difficulty in walking, not elsewhere classified - Plan: PT plan of care cert/re-cert  Cramp and spasm - Plan: PT plan of care cert/re-cert  Rationale for Evaluation and Treatment: Rehabilitation  ONSET DATE: 07/13/2023  SUBJECTIVE:   SUBJECTIVE STATEMENT: Patient reports she had partial knee replacement in October of 2023.  This was due to necrosis following a meniscus surgery.  She did rehab but has gradually started having more pain and is limited functionally.  She took a trip to [place] and was doing ok most of the trip and then had a day where they were walking up a steeper incline and she began having severe pain.  She  ended up being in a wheelchair when traveling home.    PERTINENT HISTORY: Meniscus  and partial knee replacement PAIN:  Are you having pain? Yes: NPRS scale: 0/10 now and at it's worst 5-6/10 Pain location: right knee Pain description: aching, and really stiff, worse with rainy days Aggravating factors: stairs Relieving factors: Rest, meds, ice  PRECAUTIONS: Fall  RED FLAGS: None   WEIGHT BEARING RESTRICTIONS: No  FALLS:  Has patient fallen in last 6 months? No  LIVING ENVIRONMENT: Lives with: lives with their spouse Lives in: House/apartment Stairs: Yes: Internal: 12 steps; on right going up and External: 4  steps; on right going up Has following equipment at home: Single point cane and None  OCCUPATION: retired  PLOF: Independent, Independent with basic ADLs, Independent with household mobility without device, Independent with community mobility without device, Independent with homemaking with ambulation, Independent with gait, and Independent with transfers  PATIENT GOALS: Wants to be able to walk and do stairs without pain or fear of falling, being able to walk up and down inclines.  Also enjoys gardening and traveling.   NEXT MD VISIT: prn  OBJECTIVE:  Note: Objective measures were completed at Evaluation unless otherwise noted.  DIAGNOSTIC FINDINGS: Xrays show uni compartmental knee replacement   PATIENT SURVEYS:  LEFS  Extreme difficulty/unable (0), Quite a bit of difficulty (1), Moderate difficulty (2), Little difficulty (3), No difficulty (4) Survey date:    Any of your usual work, housework or school activities 2  2. Usual hobbies, recreational or sporting activities 1  3. Getting into/out of the bath 2  4. Walking between rooms 4  5. Putting on socks/shoes 2  6. Squatting  0  7. Lifting an object, like a bag of groceries from the floor 3  8. Performing light activities around your home 4  9. Performing heavy activities around your home 3  10. Getting into/out of a car 3  11. Walking 2 blocks 2  12. Walking 1 mile 2  13. Going up/down 10 stairs (1 flight) 0  14. Standing for 1 hour 3  15.  sitting for 1 hour 4  16. Running on even ground 0  17. Running on uneven ground 0  18. Making sharp turns while running fast 0  19. Hopping  0  20. Rolling over in bed 3  Score total:  38/80     COGNITION: Overall cognitive status: Within functional limits for tasks assessed     SENSATION: WFL   POSTURE: Bilateral knee valgus  PALPATION: Crepitus right knee on flexion/ext  LOWER EXTREMITY ROM:  WFL, lacks approx 30 degrees right knee flexion and approx 10 degrees of  full esssd  LOWER EXTREMITY MMT:  Generally 4/5   LFUNCTIONAL TESTS:  5 times sit to stand: 17.64 sec Timed up and go (TUG): 14.07 sec  GAIT: Distance walked: 30 ft Assistive device utilized: None Level of assistance: Modified independence Comments: na  TREATMENT DATE: 07/15/23    PATIENT EDUCATION:  Education details: Initiated HEP and educated on alignment and importance of hip strengthening. Person educated: Patient Education method: Explanation, Demonstration, Tactile cues, and Verbal cues Education comprehension: verbalized understanding, returned demonstration, and verbal cues required  HOME EXERCISE PROGRAM: Access Code: [email or web address] URL: [email or web address] Date: 07/14/2023 Prepared by: Delon Haddock  Exercises - Supine Quadricep Sets  - 1 x daily - 7 x weekly - 1 sets - 20 reps - Supine Heel Slide  - 1 x daily - 7 x weekly - 1 sets - 10 reps - Supine Knee Extension Strengthening  - 1 x daily - 7 x weekly - 1 sets - 20 reps - Small Range Straight Leg Raise  - 1 x daily - 7 x weekly - 2 sets - 10 reps - Seated Long Arc Quad  - 1 x daily - 7 x weekly - 1 sets - 20 reps  ASSESSMENT:  CLINICAL IMPRESSION: Patient is a 68 y.o. female who was seen today for physical therapy evaluation and treatment for right knee pain.  She has hx of partial knee replacement but has been having increased pain in the past few months that is beginning to affect her function.  She avoids stairs if possible, stating that descending is more difficult.  She has functional ROM, decreased strength, and poor LE alignment.  She would benefit from skilled PT for quad rehab, alignment training, hip strengthening, gait training and functional strengthening.     OBJECTIVE IMPAIRMENTS: Abnormal gait, decreased activity tolerance, decreased balance, decreased  mobility, difficulty walking, decreased ROM, decreased strength, increased edema, increased fascial restrictions, increased muscle spasms, impaired flexibility, postural dysfunction, obesity, and pain.   ACTIVITY LIMITATIONS: carrying, lifting, bending, standing, squatting, sleeping, stairs, transfers, bed mobility, bathing, dressing, and caring for others  PARTICIPATION LIMITATIONS: meal prep, cleaning, laundry, driving, shopping, community activity, and yard work  PERSONAL FACTORS: Fitness and Past/current experiences are also affecting patient's functional outcome.   REHAB POTENTIAL: Good  CLINICAL DECISION MAKING: Stable/uncomplicated  EVALUATION COMPLEXITY: Low   GOALS: Goals reviewed with patient? Yes  SHORT TERM GOALS: Target date: 08/11/2023   Pain report to be no greater than 4/10  Baseline: Goal status: INITIAL  2.  Patient will be independent with initial HEP  Baseline:  Goal status: INITIAL   LONG TERM GOALS: Target date: 09/09/2023  Patient to report pain no greater than 2/10  Baseline:  Goal status: INITIAL  2.  Patient to be independent with advanced HEP  Baseline:  Goal status: INITIAL  3.  Functional scores (5STS and TUG) to improve by 2-3 sec Baseline:  Goal status: INITIAL  4.  LEFS to improve to 45 Baseline:  Goal status: INITIAL  5.  Patient to be able to ascend and descend steps without pain or no greater than 2/10  Baseline:  Goal status: INITIAL  6.  Patient to be able to stand or walk for at least 15 min without  Baseline:  Goal status: INITIAL   PLAN:  PT FREQUENCY: 2x/week  PT DURATION: 8 weeks  PLANNED INTERVENTIONS: 97110-Therapeutic exercises, 97530- Therapeutic activity, [id number]- Neuromuscular re-education, 97535- Self Care, 02859- Manual therapy, [id number]- Gait training, [phone]- Aquatic Therapy, [phone]- Splinting, H9716- Electrical stimulation (unattended), [id number]- Electrical stimulation (manual), 97016- Vasopneumatic device, [id number]-  Ultrasound, [id number]- Ionotophoresis 4mg /ml Dexamethasone , Patient/Family education, Balance training, Stair training, Taping, Joint mobilization, Scar mobilization, Compression bandaging, Vestibular training, DME instructions, Cryotherapy, and Moist heat  PLAN FOR NEXT SESSION: Nustep, review HEP, begin  quad rehab   [place] B. Arian Murley, PT 07/15/23 9:23 PM [facility] [address] [place], [place] [address] Phone # [phone] Fax [phone]

## 2023-08-08 ENCOUNTER — Ambulatory Visit: Admitting: Rehabilitative and Restorative Service Providers"

## 2023-08-10 ENCOUNTER — Ambulatory Visit: Attending: Student

## 2023-08-10 DIAGNOSIS — R252 Cramp and spasm: Secondary | ICD-10-CM | POA: Insufficient documentation

## 2023-08-10 DIAGNOSIS — R600 Localized edema: Secondary | ICD-10-CM | POA: Insufficient documentation

## 2023-08-10 DIAGNOSIS — R293 Abnormal posture: Secondary | ICD-10-CM | POA: Diagnosis present

## 2023-08-10 DIAGNOSIS — G8929 Other chronic pain: Secondary | ICD-10-CM | POA: Diagnosis present

## 2023-08-10 DIAGNOSIS — R262 Difficulty in walking, not elsewhere classified: Secondary | ICD-10-CM | POA: Insufficient documentation

## 2023-08-10 DIAGNOSIS — M25661 Stiffness of right knee, not elsewhere classified: Secondary | ICD-10-CM | POA: Diagnosis present

## 2023-08-10 DIAGNOSIS — M6281 Muscle weakness (generalized): Secondary | ICD-10-CM | POA: Diagnosis present

## 2023-08-10 DIAGNOSIS — M25561 Pain in right knee: Secondary | ICD-10-CM | POA: Insufficient documentation

## 2023-08-10 NOTE — Therapy (Signed)
 OUTPATIENT PHYSICAL THERAPY LOWER EXTREMITY EVALUATION   Patient Name: Alicia [name] MRN: [id number] DOB:12-Aug-1955, 68 y.o., female Today's Date: 08/10/2023  END OF SESSION:  PT End of Session - 08/10/23 0941     Visit Number 2    Date for PT Re-Evaluation 09/08/23    Authorization Type BCBS    Progress Note Due on Visit 10    PT Start Time 0940    PT Stop Time 1015    PT Time Calculation (min) 35 min    Activity Tolerance Patient tolerated treatment well    Behavior During Therapy [facility] for tasks assessed/performed           Past Medical History:  Diagnosis Date   Allergy    Cataract    removed bilateral -2014   Chronic tension headaches    Hx of varicella    Hypertension    Kidney stones    Near syncope    Nephrolithiasis    history of  passed on own.    Palpitations    Thyroid  disease    pt. denies 06/11/19   Tubular adenoma of colon 02/1997   w/ HGD   Varicose veins    Past Surgical History:  Procedure Laterality Date   ABDOMINAL HYSTERECTOMY     fibroids non cancer    CATARACT EXTRACTION     Bilateral   cataract surgery Right 03/02/2012   CHOLECYSTECTOMY     KNEE SURGERY  04/2020   KNEE SURGERY     ROTATOR CUFF REPAIR Right    TONSILLECTOMY     Patient Active Problem List   Diagnosis Date Noted   Hypokalemia 01/25/2017   Thyroid  disease    Palpitations    Nephrolithiasis    Near syncope    Kidney stones    Hypertension    Hx of varicella    Chronic tension headaches    Allergy    Nocturnal headaches 05/04/2016   Coccygodynia 01/29/2015   Atypical chest pain 09/09/2014   Essential hypertension 09/09/2014   Midline low back pain without sciatica 09/05/2013   Renal cyst, right 09/05/2013   Fatty infiltration of liver 09/05/2013   Umbilical hernia without obstruction and without gangrene 09/05/2013   Flank pain 07/29/2013   Encounter to establish care with new doctor 07/11/2013   Left leg pain 07/11/2013   Cough 01/22/2013    Swelling in head/neck 10/31/2012   Routine health maintenance 04/05/2012   Vitamin D  deficiency 04/04/2012   Varicose veins of bilateral lower extremities with other complications 09/02/2011   Chronic venous insufficiency 09/02/2011   Allergic rhinitis 06/08/2011   Paresthesia of left arm 06/08/2011   Essential hypertension, benign 03/26/2010   OBESITY, CLASS III 10/02/2009   Other and unspecified coagulation defects 04/03/2009   PERIPHERAL EDEMA 10/22/2008   HEMORRHOIDS, INTERNAL, WITH BLEEDING 05/07/2008   NEPHROLITHIASIS, HX OF 04/28/2008   SYNCOPE 04/19/2007   ANXIETY DEPRESSION 10/31/2006   Acquired absence of genital organ 10/31/2006    PCP: Charlett Apolinar POUR, MD  REFERRING PROVIDER: Cheryll Fausto Nasuti, MD  REFERRING DIAG: knee replacement 1.5 year ago  THERAPY DIAG:  Chronic pain of right knee  Muscle weakness (generalized)  Stiffness of right knee, not elsewhere classified  Difficulty in walking, not elsewhere classified  Cramp and spasm  Localized edema  Abnormal posture  Rationale for Evaluation and Treatment: Rehabilitation  ONSET DATE: 07/13/2023  SUBJECTIVE:   SUBJECTIVE STATEMENT: Patient reports doing good.  Pain 0/10   PERTINENT HISTORY: Meniscus  and  partial knee replacement PAIN:  08/10/23 Are you having pain? Yes: NPRS scale: 0/10 Pain location: right knee Pain description: aching, and really stiff, worse with rainy days Aggravating factors: stairs Relieving factors: Rest, meds, ice  PRECAUTIONS: Fall  RED FLAGS: None   WEIGHT BEARING RESTRICTIONS: No  FALLS:  Has patient fallen in last 6 months? No  LIVING ENVIRONMENT: Lives with: lives with their spouse Lives in: House/apartment Stairs: Yes: Internal: 12 steps; on right going up and External: 4 steps; on right going up Has following equipment at home: Single point cane and None  OCCUPATION: retired  PLOF: Independent, Independent with basic ADLs, Independent with  household mobility without device, Independent with community mobility without device, Independent with homemaking with ambulation, Independent with gait, and Independent with transfers  PATIENT GOALS: Wants to be able to walk and do stairs without pain or fear of falling, being able to walk up and down inclines.  Also enjoys gardening and traveling.   NEXT MD VISIT: prn  OBJECTIVE:  Note: Objective measures were completed at Evaluation unless otherwise noted.  DIAGNOSTIC FINDINGS: Xrays show uni compartmental knee replacement   PATIENT SURVEYS:  LEFS  Extreme difficulty/unable (0), Quite a bit of difficulty (1), Moderate difficulty (2), Little difficulty (3), No difficulty (4) Survey date:    Any of your usual work, housework or school activities 2  2. Usual hobbies, recreational or sporting activities 1  3. Getting into/out of the bath 2  4. Walking between rooms 4  5. Putting on socks/shoes 2  6. Squatting  0  7. Lifting an object, like a bag of groceries from the floor 3  8. Performing light activities around your home 4  9. Performing heavy activities around your home 3  10. Getting into/out of a car 3  11. Walking 2 blocks 2  12. Walking 1 mile 2  13. Going up/down 10 stairs (1 flight) 0  14. Standing for 1 hour 3  15.  sitting for 1 hour 4  16. Running on even ground 0  17. Running on uneven ground 0  18. Making sharp turns while running fast 0  19. Hopping  0  20. Rolling over in bed 3  Score total:  38/80     COGNITION: Overall cognitive status: Within functional limits for tasks assessed     SENSATION: WFL   POSTURE: Bilateral knee valgus  PALPATION: Crepitus right knee on flexion/ext  LOWER EXTREMITY ROM:  WFL, lacks approx 30 degrees right knee flexion and approx 10 degrees of full esssd  LOWER EXTREMITY MMT:  Generally 4/5   LFUNCTIONAL TESTS:  5 times sit to stand: 17.64 sec Timed up and go (TUG): 14.07 sec  GAIT: Distance walked: 30  ft Assistive device utilized: None Level of assistance: Modified independence Comments: na    TREATMENT DATE: 08/10/23 Nustep x 5 min level 3 Reviewed HEP  Seated march x 20 both Sit to stand x 10 Squat to table x 10 Lateral band walks x 3 laps of 15 feet with blue loop Supine quad set x 20 both Supine SAQ with 2 lbs x 20 both Supine SLR with 2 lbs 2 x 10 each Supine clamshell x 20 with blue loop Side lying clam with blue loop 2 x 10 both  TREATMENT DATE: 07/15/23  Initial eval completed and initiated HEP, also education: see below   PATIENT EDUCATION:  Education details: Initiated HEP and educated on alignment and importance of hip strengthening. Person educated: Patient Education method: Explanation, Demonstration, Tactile cues, and Verbal cues Education comprehension: verbalized understanding, returned demonstration, and verbal cues required  HOME EXERCISE PROGRAM: Access Code: [email or web address] URL: [email or web address] Date: 08/10/2023 Prepared by: Delon Haddock  Exercises - Supine Quadricep Sets  - 1 x daily - 7 x weekly - 1 sets - 20 reps - Supine Heel Slide  - 1 x daily - 7 x weekly - 1 sets - 10 reps - Supine Knee Extension Strengthening  - 1 x daily - 7 x weekly - 1 sets - 20 reps - Small Range Straight Leg Raise  - 1 x daily - 7 x weekly - 2 sets - 10 reps - Seated Long Arc Quad  - 1 x daily - 7 x weekly - 1 sets - 20 reps - Side Stepping with Resistance at Ankles  - 1 x daily - 7 x weekly - 3 sets - 10 reps - Hooklying Clamshell with Resistance  - 1 x daily - 7 x weekly - 1 sets - 20 reps - Clamshell with Resistance  - 1 x daily - 7 x weekly - 2 sets - 10 reps  ASSESSMENT:  CLINICAL IMPRESSION: Alicia [name] did very well today.  She need minimal vc's for proper alignment and techniqe.  She had some minor discomfort on squats to  table.  This resolved with decreasing flexion range. She is well motivated and compliant.  She should continue to do well.  She would benefit from continuing skilled PT for quad rehab, alignment training, hip strengthening, gait training and functional strengthening.     OBJECTIVE IMPAIRMENTS: Abnormal gait, decreased activity tolerance, decreased balance, decreased mobility, difficulty walking, decreased ROM, decreased strength, increased edema, increased fascial restrictions, increased muscle spasms, impaired flexibility, postural dysfunction, obesity, and pain.   ACTIVITY LIMITATIONS: carrying, lifting, bending, standing, squatting, sleeping, stairs, transfers, bed mobility, bathing, dressing, and caring for others  PARTICIPATION LIMITATIONS: meal prep, cleaning, laundry, driving, shopping, community activity, and yard work  PERSONAL FACTORS: Fitness and Past/current experiences are also affecting patient's functional outcome.   REHAB POTENTIAL: Good  CLINICAL DECISION MAKING: Stable/uncomplicated  EVALUATION COMPLEXITY: Low   GOALS: Goals reviewed with patient? Yes  SHORT TERM GOALS: Target date: 08/11/2023   Pain report to be no greater than 4/10  Baseline: Goal status: INITIAL  2.  Patient will be independent with initial HEP  Baseline:  Goal status: INITIAL   LONG TERM GOALS: Target date: 09/09/2023  Patient to report pain no greater than 2/10  Baseline:  Goal status: INITIAL  2.  Patient to be independent with advanced HEP  Baseline:  Goal status: INITIAL  3.  Functional scores (5STS and TUG) to improve by 2-3 sec Baseline:  Goal status: INITIAL  4.  LEFS to improve to 45 Baseline:  Goal status: INITIAL  5.  Patient to be able to ascend and descend steps without pain or no greater than 2/10  Baseline:  Goal status: INITIAL  6.  Patient to be able to stand or walk for at least 15 min without  Baseline:  Goal status: INITIAL   PLAN:  PT FREQUENCY:  2x/week  PT DURATION: 8 weeks  PLANNED INTERVENTIONS: 97110-Therapeutic exercises, 97530- Therapeutic activity, [id number]- Neuromuscular re-education, 97535- Self Care, 02859- Manual therapy, [id number]- Gait training, [id number]- Aquatic Therapy, [id number]-  Splinting, G0283- Electrical stimulation (unattended), [phone]- Electrical stimulation (manual), 02983- Vasopneumatic device, [id number]- Ultrasound, [id number]- Ionotophoresis 4mg /ml Dexamethasone , Patient/Family education, Balance training, Stair training, Taping, Joint mobilization, Scar mobilization, Compression bandaging, Vestibular training, DME instructions, Cryotherapy, and Moist heat  PLAN FOR NEXT SESSION: Nustep, hip strengthening, progress HEP, progress quad rehab   [place] B. Jaxten Brosh, PT 08/10/23 11:24 AM [facility] [address] [place], [place] [address] Phone # [phone] Fax [phone]

## 2023-08-15 ENCOUNTER — Ambulatory Visit

## 2023-08-17 ENCOUNTER — Ambulatory Visit

## 2023-08-22 ENCOUNTER — Encounter: Payer: Self-pay | Admitting: Rehabilitative and Restorative Service Providers"

## 2023-08-22 ENCOUNTER — Ambulatory Visit: Attending: Student | Admitting: Rehabilitative and Restorative Service Providers"

## 2023-08-22 DIAGNOSIS — G8929 Other chronic pain: Secondary | ICD-10-CM | POA: Insufficient documentation

## 2023-08-22 DIAGNOSIS — M6281 Muscle weakness (generalized): Secondary | ICD-10-CM | POA: Diagnosis present

## 2023-08-22 DIAGNOSIS — R252 Cramp and spasm: Secondary | ICD-10-CM | POA: Insufficient documentation

## 2023-08-22 DIAGNOSIS — M25661 Stiffness of right knee, not elsewhere classified: Secondary | ICD-10-CM | POA: Insufficient documentation

## 2023-08-22 DIAGNOSIS — M25561 Pain in right knee: Secondary | ICD-10-CM | POA: Diagnosis present

## 2023-08-22 DIAGNOSIS — R262 Difficulty in walking, not elsewhere classified: Secondary | ICD-10-CM | POA: Diagnosis present

## 2023-08-22 NOTE — Therapy (Signed)
 OUTPATIENT PHYSICAL THERAPY TREATMENT NOTE   Patient Name: Alicia [name] MRN: [id number] DOB:[birth year], 68 y.o., female Today's Date: 08/22/2023  END OF SESSION:  PT End of Session - 08/22/23 1106     Visit Number 3    Date for PT Re-Evaluation 09/08/23    Authorization Type BCBS    Progress Note Due on Visit 10    PT Start Time 1015    PT Stop Time 1057    PT Time Calculation (min) 42 min    Activity Tolerance Patient tolerated treatment well    Behavior During Therapy WFL for tasks assessed/performed            Past Medical History:  Diagnosis Date   Allergy    Cataract    removed bilateral -2014   Chronic tension headaches    Hx of varicella    Hypertension    Kidney stones    Near syncope    Nephrolithiasis    history of  passed on own.    Palpitations    Thyroid  disease    pt. denies 06/11/19   Tubular adenoma of colon 02/1997   w/ HGD   Varicose veins    Past Surgical History:  Procedure Laterality Date   ABDOMINAL HYSTERECTOMY     fibroids non cancer    CATARACT EXTRACTION     Bilateral   cataract surgery Right 03/02/2012   CHOLECYSTECTOMY     KNEE SURGERY  04/2020   KNEE SURGERY     ROTATOR CUFF REPAIR Right    TONSILLECTOMY     Patient Active Problem List   Diagnosis Date Noted   Hypokalemia 01/25/2017   Thyroid  disease    Palpitations    Nephrolithiasis    Near syncope    Kidney stones    Hypertension    Hx of varicella    Chronic tension headaches    Allergy    Nocturnal headaches 05/04/2016   Coccygodynia 01/29/2015   Atypical chest pain 09/09/2014   Essential hypertension 09/09/2014   Midline low back pain without sciatica 09/05/2013   Renal cyst, right 09/05/2013   Fatty infiltration of liver 09/05/2013   Umbilical hernia without obstruction and without gangrene 09/05/2013   Flank pain 07/29/2013   Encounter to establish care with new doctor 07/11/2013   Left leg pain 07/11/2013   Cough 01/22/2013   Swelling in  head/neck 10/31/2012   Routine health maintenance 04/05/2012   Vitamin D  deficiency 04/04/2012   Varicose veins of bilateral lower extremities with other complications 09/02/2011   Chronic venous insufficiency 09/02/2011   Allergic rhinitis 06/08/2011   Paresthesia of left arm 06/08/2011   Essential hypertension, benign 03/26/2010   OBESITY, CLASS III 10/02/2009   Other and unspecified coagulation defects 04/03/2009   PERIPHERAL EDEMA 10/22/2008   HEMORRHOIDS, INTERNAL, WITH BLEEDING 05/07/2008   NEPHROLITHIASIS, HX OF 04/28/2008   SYNCOPE 04/19/2007   ANXIETY DEPRESSION 10/31/2006   Acquired absence of genital organ 10/31/2006    PCP: Charlett Apolinar POUR, MD  REFERRING PROVIDER: Cheryll Fausto Nasuti, MD  REFERRING DIAG: knee replacement 1.5 year ago  THERAPY DIAG:  Stiffness of right knee, not elsewhere classified  Chronic pain of right knee  Difficulty in walking, not elsewhere classified  Muscle weakness (generalized)  Cramp and spasm  Rationale for Evaluation and Treatment: Rehabilitation  ONSET DATE: 07/13/2023  SUBJECTIVE:   SUBJECTIVE STATEMENT: Patient reports pain 0/10. She reports she hasn't been 'too busy this week', however has been going to her gym  still. She states she went to the pool and that felt very relaxing to the knee. She says her brother who's a cancer patient is staying with her and she's helping take care of him.   PERTINENT HISTORY: Meniscus  and partial knee replacement PAIN:  08/22/23 Are you having pain? Yes: NPRS scale: 0/10 Pain location: right knee Pain description: aching, and really stiff, worse with rainy days Aggravating factors: stairs Relieving factors: Rest, meds, ice  PRECAUTIONS: Fall  RED FLAGS: None   WEIGHT BEARING RESTRICTIONS: No  FALLS:  Has patient fallen in last 6 months? No  LIVING ENVIRONMENT: Lives with: lives with their spouse Lives in: House/apartment Stairs: Yes: Internal: 12 steps; on right going  up and External: 4 steps; on right going up Has following equipment at home: Single point cane and None  OCCUPATION: retired  PLOF: Independent, Independent with basic ADLs, Independent with household mobility without device, Independent with community mobility without device, Independent with homemaking with ambulation, Independent with gait, and Independent with transfers  PATIENT GOALS: Wants to be able to walk and do stairs without pain or fear of falling, being able to walk up and down inclines.  Also enjoys gardening and traveling.   NEXT MD VISIT: prn  OBJECTIVE:  Note: Objective measures were completed at Evaluation unless otherwise noted.  DIAGNOSTIC FINDINGS: Xrays show uni compartmental knee replacement   PATIENT SURVEYS:  LEFS  Extreme difficulty/unable (0), Quite a bit of difficulty (1), Moderate difficulty (2), Little difficulty (3), No difficulty (4) Survey date:  Eval  Any of your usual work, housework or school activities 2  2. Usual hobbies, recreational or sporting activities 1  3. Getting into/out of the bath 2  4. Walking between rooms 4  5. Putting on socks/shoes 2  6. Squatting  0  7. Lifting an object, like a bag of groceries from the floor 3  8. Performing light activities around your home 4  9. Performing heavy activities around your home 3  10. Getting into/out of a car 3  11. Walking 2 blocks 2  12. Walking 1 mile 2  13. Going up/down 10 stairs (1 flight) 0  14. Standing for 1 hour 3  15.  sitting for 1 hour 4  16. Running on even ground 0  17. Running on uneven ground 0  18. Making sharp turns while running fast 0  19. Hopping  0  20. Rolling over in bed 3  Score total:  38/80     COGNITION: Overall cognitive status: Within functional limits for tasks assessed     SENSATION: WFL   POSTURE: Bilateral knee valgus  PALPATION: Crepitus right knee on flexion/ext  LOWER EXTREMITY ROM:  WFL, lacks approx 30 degrees right knee flexion and  approx 10 degrees of full esssd  LOWER EXTREMITY MMT:  Generally 4/5   LFUNCTIONAL TESTS:  5 times sit to stand: 17.64 sec Timed up and go (TUG): 14.07 sec  GAIT: Distance walked: 30 ft Assistive device utilized: None Level of assistance: Modified independence Comments: na    TREATMENT DATE:    08/22/23 Nustep x 5 min level 3 with PT/SPT present to discuss status Seated marches x20  Seated marches with blue light band around knees x15 Sit to stands with blue light band x 10 Lateral band walks x 5 laps with blue loop Supine quad set x20 for both Supine SLR with 2.5 lbs 2x10 each  Supine clamshell x20 yellow loop Side lying clam with yellow loop  2x10 each side  Piriformis stretch in seated 2x20 sec   08/10/23 Nustep x 5 min level 3 Reviewed HEP  Seated march x 20 both Sit to stand x 10 Squat to table x 10 Lateral band walks x 3 laps of 15 feet with blue loop Supine quad set x 20 both Supine SAQ with 2 lbs x 20 both Supine SLR with 2 lbs 2 x 10 each Supine clamshell x 20 with blue loop Side lying clam with blue loop 2 x 10 both                                                                                                                               TREATMENT DATE: 07/15/23  Initial eval completed and initiated HEP, also education: see below   PATIENT EDUCATION:  Education details: Initiated HEP and educated on alignment and importance of hip strengthening. Person educated: Patient Education method: Explanation, Demonstration, Tactile cues, and Verbal cues Education comprehension: verbalized understanding, returned demonstration, and verbal cues required  HOME EXERCISE PROGRAM: Access Code: [email or web address] URL: [email or web address] Date: 08/10/2023 Prepared by: Delon Haddock  Exercises - Supine Quadricep Sets  - 1 x daily - 7 x weekly - 1 sets - 20 reps - Supine Heel Slide  - 1 x daily - 7 x weekly - 1 sets - 10 reps - Supine Knee Extension  Strengthening  - 1 x daily - 7 x weekly - 1 sets - 20 reps - Small Range Straight Leg Raise  - 1 x daily - 7 x weekly - 2 sets - 10 reps - Seated Long Arc Quad  - 1 x daily - 7 x weekly - 1 sets - 20 reps - Side Stepping with Resistance at Ankles  - 1 x daily - 7 x weekly - 3 sets - 10 reps - Hooklying Clamshell with Resistance  - 1 x daily - 7 x weekly - 1 sets - 20 reps - Clamshell with Resistance  - 1 x daily - 7 x weekly - 2 sets - 10 reps  ASSESSMENT:  CLINICAL IMPRESSION: Rylyn stated she felt great by end of session, and could feel an improvement with her muscles being activated, particularly her quadriceps muscle during quad sets. Today's session focused on progressing hip muscle strengthening by adding more resistance and weight to exercise, followed by functional activities. Pt tolerated progressions well, stating she could feel the appropriate muscle being activated. She required several verbal and tactile cues during quad set exercise, with contracting the quad muscle. Pt still presents with hip muscle tightness and weakness, thus would benefit from skilled PT for quad rehab, alignment training, hip strengthening, gait training and functional strengthening.    OBJECTIVE IMPAIRMENTS: Abnormal gait, decreased activity tolerance, decreased balance, decreased mobility, difficulty walking, decreased ROM, decreased strength, increased edema, increased fascial restrictions, increased muscle spasms, impaired flexibility, postural dysfunction, obesity, and pain.   ACTIVITY LIMITATIONS: carrying, lifting, bending, standing, squatting, sleeping, stairs,  transfers, bed mobility, bathing, dressing, and caring for others  PARTICIPATION LIMITATIONS: meal prep, cleaning, laundry, driving, shopping, community activity, and yard work  PERSONAL FACTORS: Fitness and Past/current experiences are also affecting patient's functional outcome.   REHAB POTENTIAL: Good  CLINICAL DECISION MAKING:  Stable/uncomplicated  EVALUATION COMPLEXITY: Low   GOALS: Goals reviewed with patient? Yes  SHORT TERM GOALS: Target date: 08/11/2023   Pain report to be no greater than 4/10  Baseline: Goal status: MET 0/10 on 08/22/23  2.  Patient will be independent with initial HEP  Baseline:  Goal status: Ongoing    LONG TERM GOALS: Target date: 09/09/2023  Patient to report pain no greater than 2/10  Baseline:  Goal status: Progressing   2.  Patient to be independent with advanced HEP  Baseline:  Goal status: Progressing   3.  Functional scores (5STS and TUG) to improve by 2-3 sec Baseline:  Goal status: INITIAL  4.  LEFS to improve to 45 Baseline:  Goal status: Progressing   5.  Patient to be able to ascend and descend steps without pain or no greater than 2/10  Baseline:  Goal status: INITIAL  6.  Patient to be able to stand or walk for at least 15 min without  Baseline:  Goal status: Progressing    PLAN:  PT FREQUENCY: 2x/week  PT DURATION: 8 weeks  PLANNED INTERVENTIONS: 97110-Therapeutic exercises, 97530- Therapeutic activity, [id number]- Neuromuscular re-education, 97535- Self Care, 02859- Manual therapy, [phone]- Gait training, [phone]- Aquatic Therapy, [phone]- Splinting, H9716- Electrical stimulation (unattended), [phone]- Electrical stimulation (manual), [id number]- Vasopneumatic device, 97035- Ultrasound, 02966- Ionotophoresis 4mg /ml Dexamethasone , Patient/Family education, Balance training, Stair training, Taping, Joint mobilization, Scar mobilization, Compression bandaging, Vestibular training, DME instructions, Cryotherapy, and Moist heat  PLAN FOR NEXT SESSION: Nustep, hip strengthening, progress HEP, progress quad rehab   Lavanda Cleverly, SPT 08/22/23 12:11 PM   I agree with the following treatment note after reviewing documentation. This session was performed under the supervision of a licensed clinician. Jarrell Laming, PT, DPT 08/22/23, 12:11 PM  [facility] [address] [place], [place] [address] Phone # [phone] Fax [phone]

## 2023-08-24 ENCOUNTER — Ambulatory Visit: Admitting: Rehabilitative and Restorative Service Providers"

## 2023-08-24 ENCOUNTER — Encounter: Payer: Self-pay | Admitting: Rehabilitative and Restorative Service Providers"

## 2023-08-24 DIAGNOSIS — G8929 Other chronic pain: Secondary | ICD-10-CM

## 2023-08-24 DIAGNOSIS — R262 Difficulty in walking, not elsewhere classified: Secondary | ICD-10-CM

## 2023-08-24 DIAGNOSIS — M25661 Stiffness of right knee, not elsewhere classified: Secondary | ICD-10-CM | POA: Diagnosis not present

## 2023-08-24 DIAGNOSIS — R252 Cramp and spasm: Secondary | ICD-10-CM

## 2023-08-24 DIAGNOSIS — M6281 Muscle weakness (generalized): Secondary | ICD-10-CM

## 2023-08-24 NOTE — Therapy (Signed)
 OUTPATIENT PHYSICAL THERAPY TREATMENT NOTE   Patient Name: Alicia [name] MRN: [id number] DOB:Jun 02, 1955, 68 y.o., female Today's Date: 08/24/2023  END OF SESSION:  PT End of Session - 08/24/23 1230     Visit Number 4    Date for PT Re-Evaluation 09/08/23    Authorization Type [employer]    Progress Note Due on Visit 10    PT Start Time 1145    PT Stop Time 1225    PT Time Calculation (min) 40 min    Activity Tolerance Patient tolerated treatment well    Behavior During Therapy WFL for tasks assessed/performed             Past Medical History:  Diagnosis Date   Allergy    Cataract    removed bilateral -2014   Chronic tension headaches    Hx of varicella    Hypertension    Kidney stones    Near syncope    Nephrolithiasis    history of  passed on own.    Palpitations    Thyroid  disease    pt. denies 06/11/19   Tubular adenoma of colon 02/1997   w/ HGD   Varicose veins    Past Surgical History:  Procedure Laterality Date   ABDOMINAL HYSTERECTOMY     fibroids non cancer    CATARACT EXTRACTION     Bilateral   cataract surgery Right 03/02/2012   CHOLECYSTECTOMY     KNEE SURGERY  04/2020   KNEE SURGERY     ROTATOR CUFF REPAIR Right    TONSILLECTOMY     Patient Active Problem List   Diagnosis Date Noted   Hypokalemia 01/25/2017   Thyroid  disease    Palpitations    Nephrolithiasis    Near syncope    Kidney stones    Hypertension    Hx of varicella    Chronic tension headaches    Allergy    Nocturnal headaches 05/04/2016   Coccygodynia 01/29/2015   Atypical chest pain 09/09/2014   Essential hypertension 09/09/2014   Midline low back pain without sciatica 09/05/2013   Renal cyst, right 09/05/2013   Fatty infiltration of liver 09/05/2013   Umbilical hernia without obstruction and without gangrene 09/05/2013   Flank pain 07/29/2013   Encounter to establish care with new doctor 07/11/2013   Left leg pain 07/11/2013   Cough 01/22/2013   Swelling in  head/neck 10/31/2012   Routine health maintenance 04/05/2012   Vitamin D  deficiency 04/04/2012   Varicose veins of bilateral lower extremities with other complications 09/02/2011   Chronic venous insufficiency 09/02/2011   Allergic rhinitis 06/08/2011   Paresthesia of left arm 06/08/2011   Essential hypertension, benign 03/26/2010   OBESITY, CLASS III 10/02/2009   Other and unspecified coagulation defects 04/03/2009   PERIPHERAL EDEMA 10/22/2008   HEMORRHOIDS, INTERNAL, WITH BLEEDING 05/07/2008   NEPHROLITHIASIS, HX OF 04/28/2008   SYNCOPE 04/19/2007   ANXIETY DEPRESSION 10/31/2006   Acquired absence of genital organ 10/31/2006    PCP: Charlett Apolinar POUR, MD  REFERRING PROVIDER: Cheryll Fausto Nasuti, MD  REFERRING DIAG: knee replacement 1.5 year ago  THERAPY DIAG:  Stiffness of right knee, not elsewhere classified  Muscle weakness (generalized)  Cramp and spasm  Chronic pain of right knee  Difficulty in walking, not elsewhere classified  Rationale for Evaluation and Treatment: Rehabilitation  ONSET DATE: 07/13/2023  SUBJECTIVE:   SUBJECTIVE STATEMENT: Pt reports she's been working on her exercises at home. Pt reports no pain in her knee or elsewhere.  PERTINENT HISTORY: Meniscus  and partial knee replacement PAIN:  08/24/23 Are you having pain? Yes: NPRS scale: 0/10 Pain location: right knee Pain description: aching, and really stiff, worse with rainy days Aggravating factors: stairs Relieving factors: Rest, meds, ice  PRECAUTIONS: Fall  RED FLAGS: None   WEIGHT BEARING RESTRICTIONS: No  FALLS:  Has patient fallen in last 6 months? No  LIVING ENVIRONMENT: Lives with: lives with their spouse Lives in: House/apartment Stairs: Yes: Internal: 12 steps; on right going up and External: 4 steps; on right going up Has following equipment at home: Single point cane and None  OCCUPATION: retired  PLOF: Independent, Independent with basic ADLs, Independent  with household mobility without device, Independent with community mobility without device, Independent with homemaking with ambulation, Independent with gait, and Independent with transfers  PATIENT GOALS: Wants to be able to walk and do stairs without pain or fear of falling, being able to walk up and down inclines.  Also enjoys gardening and traveling.   NEXT MD VISIT: prn  OBJECTIVE:  Note: Objective measures were completed at Evaluation unless otherwise noted.  DIAGNOSTIC FINDINGS: Xrays show uni compartmental knee replacement   PATIENT SURVEYS:  LEFS  Extreme difficulty/unable (0), Quite a bit of difficulty (1), Moderate difficulty (2), Little difficulty (3), No difficulty (4) Survey date:  Eval  Any of your usual work, housework or school activities 2  2. Usual hobbies, recreational or sporting activities 1  3. Getting into/out of the bath 2  4. Walking between rooms 4  5. Putting on socks/shoes 2  6. Squatting  0  7. Lifting an object, like a bag of groceries from the floor 3  8. Performing light activities around your home 4  9. Performing heavy activities around your home 3  10. Getting into/out of a car 3  11. Walking 2 blocks 2  12. Walking 1 mile 2  13. Going up/down 10 stairs (1 flight) 0  14. Standing for 1 hour 3  15.  sitting for 1 hour 4  16. Running on even ground 0  17. Running on uneven ground 0  18. Making sharp turns while running fast 0  19. Hopping  0  20. Rolling over in bed 3  Score total:  38/80     COGNITION: Overall cognitive status: Within functional limits for tasks assessed     SENSATION: WFL   POSTURE: Bilateral knee valgus  PALPATION: Crepitus right knee on flexion/ext  LOWER EXTREMITY ROM:  WFL, lacks approx 30 degrees right knee flexion and approx 10 degrees of full esssd  LOWER EXTREMITY MMT:  Generally 4/5   LFUNCTIONAL TESTS:  Eval: 5 times sit to stand: 17.64 sec Timed up and go (TUG): 14.07 sec  GAIT: Distance  walked: 30 ft Assistive device utilized: None Level of assistance: Modified independence Comments: na    TREATMENT DATE:   08/24/23 Nustep x 5 min level 5 with PT/SPT present to monitor and discuss status  Seated hamstring stretch 2x20 sec Seated piriformis stretch 2x20 sec Standing TKE with yellow band PT student 2x10 each side Quad muscle stretch in standing 2x20sec Seated marches x20 with yellow loop around knees Seated marches 2x8 with AW 3# on each ankle Seated LAQ x20 for both Seated reverse crunch using dynadisc 2x15 Seated heel taps onto floor using dynadisc 2x10 Supine SLR 3 lbs each 2x10 Standing hip abduction with AW 3# 2x5     08/22/23 Nustep x 5 min level 3 with PT/SPT present to  discuss status Seated marches x20  Seated marches with blue light band around knees x15 Sit to stands with blue light band x 10 Lateral band walks x 5 laps with blue loop Supine quad set x20 for both Supine SLR with 2.5 lbs 2x10 each  Supine clamshell x20 yellow loop Side lying clam with yellow loop 2x10 each side  Piriformis stretch in seated 2x20 sec   08/10/23 Nustep x 5 min level 3 Reviewed HEP  Seated march x 20 both Sit to stand x 10 Squat to table x 10 Lateral band walks x 3 laps of 15 feet with blue loop Supine quad set x 20 both Supine SAQ with 2 lbs x 20 both Supine SLR with 2 lbs 2 x 10 each Supine clamshell x 20 with blue loop Side lying clam with blue loop 2 x 10 both                                                                                                                                PATIENT EDUCATION:  Education details: Initiated HEP and educated on alignment and importance of hip strengthening. Person educated: Patient Education method: Explanation, Demonstration, Tactile cues, and Verbal cues Education comprehension: verbalized understanding, returned demonstration, and verbal cues required  HOME EXERCISE PROGRAM: Access Code: [email or web address] URL:  [email or web address] Date: 08/10/2023 Prepared by: Delon Haddock  Exercises - Supine Quadricep Sets  - 1 x daily - 7 x weekly - 1 sets - 20 reps - Supine Heel Slide  - 1 x daily - 7 x weekly - 1 sets - 10 reps - Supine Knee Extension Strengthening  - 1 x daily - 7 x weekly - 1 sets - 20 reps - Small Range Straight Leg Raise  - 1 x daily - 7 x weekly - 2 sets - 10 reps - Seated Long Arc Quad  - 1 x daily - 7 x weekly - 1 sets - 20 reps - Side Stepping with Resistance at Ankles  - 1 x daily - 7 x weekly - 3 sets - 10 reps - Hooklying Clamshell with Resistance  - 1 x daily - 7 x weekly - 1 sets - 20 reps - Clamshell with Resistance  - 1 x daily - 7 x weekly - 2 sets - 10 reps  ASSESSMENT:  CLINICAL IMPRESSION:  Dayelin presented to skilled PT today with no reported knee pain, and stated she knows PT is working for her. Pt wasn't able to completely straighten/extend Rt knee during hamstring stretch, thus the focus of today's session was to work through end ROM during Rt knee extension, combined with LE strengthening. Pt demonstrated core weakness, so 2 new transversus abdominis muscle exercises were added to her session/HEP. She responded well to those, finding them an appropriate challenge. Pt demonstrated increased mobility with Rt knee extension by end of session when performing seated hamstring stretch. Pt still presents with hip muscle tightness and weakness, and  would benefit from skilled PT for quad rehab, postural training, hip strengthening, and functional training.    OBJECTIVE IMPAIRMENTS: Abnormal gait, decreased activity tolerance, decreased balance, decreased mobility, difficulty walking, decreased ROM, decreased strength, increased edema, increased fascial restrictions, increased muscle spasms, impaired flexibility, postural dysfunction, obesity, and pain.   ACTIVITY LIMITATIONS: carrying, lifting, bending, standing, squatting, sleeping, stairs, transfers, bed  mobility, bathing, dressing, and caring for others  PARTICIPATION LIMITATIONS: meal prep, cleaning, laundry, driving, shopping, community activity, and yard work  PERSONAL FACTORS: Fitness and Past/current experiences are also affecting patient's functional outcome.   REHAB POTENTIAL: Good  CLINICAL DECISION MAKING: Stable/uncomplicated  EVALUATION COMPLEXITY: Low   GOALS: Goals reviewed with patient? Yes  SHORT TERM GOALS: Target date: 08/11/2023   Pain report to be no greater than 4/10  Baseline: Goal status: MET 0/10 on 08/22/23  2.  Patient will be independent with initial HEP  Baseline:  Goal status: Met on 08/24/23   LONG TERM GOALS: Target date: 09/09/2023  Patient to report pain no greater than 2/10  Baseline:  Goal status: Progressing   2.  Patient to be independent with advanced HEP  Baseline:  Goal status: Progressing   3.  Functional scores (5STS and TUG) to improve by 2-3 sec Baseline:  Goal status: Progressing   4.  LEFS to improve to 45 Baseline:  Goal status: Progressing   5.  Patient to be able to ascend and descend steps without pain or no greater than 2/10  Baseline:  Goal status: Progressing   6.  Patient to be able to stand or walk for at least 15 min without  Baseline:  Goal status: Progressing    PLAN:  PT FREQUENCY: 2x/week  PT DURATION: 8 weeks  PLANNED INTERVENTIONS: 97110-Therapeutic exercises, 97530- Therapeutic activity, [id number]- Neuromuscular re-education, 97535- Self Care, [id number]- Manual therapy, [id number]- Gait training, [phone]- Aquatic Therapy, [phone]- Splinting, H9716- Electrical stimulation (unattended), [phone]- Electrical stimulation (manual), 97016- Vasopneumatic device, 97035- Ultrasound, 02966- Ionotophoresis 4mg /ml Dexamethasone , Patient/Family education, Balance training, Stair training, Taping, Joint mobilization, Scar mobilization, Compression bandaging, Vestibular training, DME instructions, Cryotherapy, and Moist heat  PLAN  FOR NEXT SESSION: Nustep, hip strengthening, progress HEP, progress quad rehab, Leg press machine for strengthening, continue TKE exercises and anything to help end ROM of Rt knee extension    Lavanda Cleverly, SPT 08/24/23 1:07 PM   I agree with the following treatment note after reviewing documentation. This session was performed under the supervision of a licensed clinician. Jarrell Laming, PT, DPT 08/24/23, 1:07 PM  [facility] [address], [address] [place], [place] [address] Phone # [phone] Fax [phone]

## 2023-08-29 ENCOUNTER — Ambulatory Visit

## 2023-08-31 ENCOUNTER — Encounter

## 2023-09-05 ENCOUNTER — Ambulatory Visit

## 2023-09-07 ENCOUNTER — Encounter: Admitting: Rehabilitative and Restorative Service Providers"

## 2023-09-21 ENCOUNTER — Emergency Department (HOSPITAL_BASED_OUTPATIENT_CLINIC_OR_DEPARTMENT_OTHER)
Admission: EM | Admit: 2023-09-21 | Discharge: 2023-09-21 | Disposition: A | Discharge status: Home / Self Care | Source: Ambulatory Visit | Attending: Emergency Medicine | Admitting: Emergency Medicine

## 2023-09-21 ENCOUNTER — Other Ambulatory Visit: Payer: Self-pay

## 2023-09-21 DIAGNOSIS — M79602 Pain in left arm: Secondary | ICD-10-CM | POA: Insufficient documentation

## 2023-09-21 DIAGNOSIS — I10 Essential (primary) hypertension: Secondary | ICD-10-CM | POA: Insufficient documentation

## 2023-09-21 DIAGNOSIS — R109 Unspecified abdominal pain: Secondary | ICD-10-CM | POA: Diagnosis not present

## 2023-09-21 DIAGNOSIS — R001 Bradycardia, unspecified: Secondary | ICD-10-CM | POA: Diagnosis present

## 2023-09-21 LAB — CBC WITH DIFFERENTIAL/PLATELET
Abs Immature Granulocytes: 0.02 K/uL (ref 0.00–0.07)
Basophils Absolute: 0.1 K/uL (ref 0.0–0.1)
Basophils Relative: 1 %
Eosinophils Absolute: 0.2 K/uL (ref 0.0–0.5)
Eosinophils Relative: 3 %
HCT: 40.9 % (ref 36.0–46.0)
Hemoglobin: 14.2 g/dL (ref 12.0–15.0)
Immature Granulocytes: 0 %
Lymphocytes Relative: 29 %
Lymphs Abs: 1.8 K/uL (ref 0.7–4.0)
MCH: 31 pg (ref 26.0–34.0)
MCHC: 34.7 g/dL (ref 30.0–36.0)
MCV: 89.3 fL (ref 80.0–100.0)
Monocytes Absolute: 0.8 K/uL (ref 0.1–1.0)
Monocytes Relative: 13 %
Neutro Abs: 3.4 K/uL (ref 1.7–7.7)
Neutrophils Relative %: 54 %
Platelets: 206 K/uL (ref 150–400)
RBC: 4.58 MIL/uL (ref 3.87–5.11)
RDW: 12.2 % (ref 11.5–15.5)
WBC: 6.3 K/uL (ref 4.0–10.5)
nRBC: 0 % (ref 0.0–0.2)

## 2023-09-21 LAB — COMPREHENSIVE METABOLIC PANEL WITH GFR
ALT: 18 U/L (ref 0–44)
AST: 27 U/L (ref 15–41)
Albumin: 4.3 g/dL (ref 3.5–5.0)
Alkaline Phosphatase: 83 U/L (ref 38–126)
Anion gap: 11 (ref 5–15)
BUN: 15 mg/dL (ref 8–23)
CO2: 26 mmol/L (ref 22–32)
Calcium: 9.7 mg/dL (ref 8.9–10.3)
Chloride: 101 mmol/L (ref 98–111)
Creatinine, Ser: 0.66 mg/dL (ref 0.44–1.00)
GFR, Estimated: >60 mL/min (ref >60)
Glucose, Bld: 90 mg/dL (ref 70–99)
Potassium: 3.5 mmol/L (ref 3.5–5.1)
Sodium: 139 mmol/L (ref 135–145)
Total Bilirubin: 1.2 mg/dL (ref 0.0–1.2)
Total Protein: 7.4 g/dL (ref 6.5–8.1)

## 2023-09-21 LAB — URINALYSIS, W/ REFLEX TO CULTURE (INFECTION SUSPECTED)
Bilirubin Urine: NEGATIVE
Glucose, UA: NEGATIVE mg/dL
Hgb urine dipstick: NEGATIVE
Ketones, ur: NEGATIVE mg/dL
Nitrite: NEGATIVE
Protein, ur: NEGATIVE mg/dL
Specific Gravity, Urine: 1.021 (ref 1.005–1.030)
pH: 5 (ref 5.0–8.0)

## 2023-09-21 LAB — LIPASE, BLOOD: Lipase: 29 U/L (ref 11–51)

## 2023-09-21 NOTE — ED Provider Notes (Signed)
 [facility] AT [facility] Provider Note   CSN: [id number] Arrival date & time: 09/21/23  1056     Patient presents with: Abdominal Pain   Alicia [name] is a 68 y.o. female.  With a history of hypertension, palpitations, and IBS who presents to the ED for multiple complaints.  Patient reports multiple subacute complaints.  Couple months of noticing low heart rates at home as low as the 40s.  She checks her heart rate at home.  No associated syncopal episodes or specific symptoms associated with episodes of bradycardia.  Also reporting some nighttime abdominal pain.  These episodes in the lower abdomen occur before she goes to bed and resolve after about an hour.  No nausea vomiting fevers chills or changes in bowel habits.  Also reporting 1 month of left upper extremity pain.  There was no injury to the left upper arm.  The pain seems to come and go without specific modifying factors.  No focal weakness or sensory deficits.  Denies headaches chest pain shortness of breath.    Abdominal Pain      Prior to Admission medications   Medication Sig Start Date End Date Taking? Authorizing Provider  benzonatate  (TESSALON ) 200 MG capsule Take by mouth. 01/19/23   [provider]  chlorthalidone  (HYGROTON ) 25 MG tablet Take 1 tablet (25 mg total) by mouth daily. 12/05/22   Okey Vina GAILS, MD  Cholecalciferol (VITAMIN D3) 50 MCG (2000 UT) CHEW Chew 2,000 Units by mouth daily. 11/21/22   Okey Vina GAILS, MD  colestipol  (COLESTID ) 1 g tablet Take 1 tablet (1 g total) by mouth 2 (two) times daily. 06/01/22   Beather Delon Hendricks, PA  Multiple Vitamin (MULTIVITAMIN WITH MINERALS) TABS tablet Take 1 tablet by mouth daily. Women's formula    [provider]  spironolactone  (ALDACTONE ) 25 MG tablet Take 0.5 tablets (12.5 mg total) by mouth daily. 11/21/22   Okey Vina GAILS, MD    Allergies: Ciprofloxacin  hcl, Iodinated contrast media, Iohexol, Lidocaine hcl,  Morphine, Sulfa antibiotics, Prednisone , Valsartan , Shellfish allergy, Sulfur, Ciprofloxacin , Penicillins, Sulfamethoxazole-trimethoprim, and Sulfonamide derivatives    Review of Systems  Gastrointestinal:  Positive for abdominal pain.    Updated Vital Signs BP 138/82   Pulse (!) 54   Temp 98 F (36.7 C) (Oral)   Resp 14   SpO2 95%   Physical Exam Vitals and nursing note reviewed.  HENT:     Head: Normocephalic and atraumatic.  Eyes:     Pupils: Pupils are equal, round, and reactive to light.  Cardiovascular:     Rate and Rhythm: Normal rate and regular rhythm.  Pulmonary:     Effort: Pulmonary effort is normal.     Breath sounds: Normal breath sounds.  Abdominal:     Palpations: Abdomen is soft.     Tenderness: There is no abdominal tenderness. There is no guarding or rebound.  Skin:    General: Skin is warm and dry.  Neurological:     General: No focal deficit present.     Mental Status: She is alert.     Motor: No weakness.     Comments: No motor or sensory deficit in upper extremities 2+ radial pulses bilaterally  Psychiatric:        Mood and Affect: Mood normal.     (all labs ordered are listed, but only abnormal results are displayed) Labs Reviewed  URINALYSIS, W/ REFLEX TO CULTURE (INFECTION SUSPECTED) - Abnormal; Notable for the following components:  Result Value   Leukocytes,Ua MODERATE (*)    Bacteria, UA RARE (*)    All other components within normal limits  COMPREHENSIVE METABOLIC PANEL WITH GFR  LIPASE, BLOOD  CBC WITH DIFFERENTIAL/PLATELET    EKG: EKG Interpretation Date/Time:  Thursday September 21 2023 14:01:09 EDT Ventricular Rate:  57 PR Interval:  122 QRS Duration:  115 QT Interval:  511 QTC Calculation: 498 R Axis:   27  Text Interpretation: Sinus rhythm Nonspecific intraventricular conduction delay Low voltage, precordial leads Borderline T abnormalities, anterior leads Confirmed by Pamella Sharper [phone]) on 09/21/2023 2:59:14  PM  Radiology: No results found.   Procedures   Medications Ordered in the ED - No data to display                                  Medical Decision Making 68 year old female presenting for multiple subacute complaints.  Asymptomatic bradycardia, atraumatic left upper extremity pain and intermittent episodic lower abdominal pain.  No focal neurologic deficits on my exam.  No abdominal tenderness.  Bradycardic in the 50s sinus bradycardia.  Normotensive.  Very well-appearing.  Laboratory workup is reassuring with no significant abnormalities on metabolic panel or CBC.  UA negative for UTI.  EKG without evidence of ischemic changes.  She will follow-up with her GI doctor later this month and PCP as well for these ongoing issues.  Appropriate for discharge at this time.  Amount and/or Complexity of Data Reviewed Labs: ordered.        Final diagnoses:  Left arm pain  Bradycardia  Abdominal pain, unspecified abdominal location    ED Discharge Orders     None          Pamella Sharper LABOR, DO 09/21/23 1513

## 2023-09-21 NOTE — ED Notes (Signed)
 ED Provider at bedside.

## 2023-09-21 NOTE — ED Triage Notes (Signed)
 Patient reports left arm heaviness and abdominal pain for several weeks. Patient states she was also checking her pulse and BP at home and said values were all over the place with pulse being as low as 40.

## 2023-09-21 NOTE — Discharge Instructions (Signed)
 You were seen in the emergency department for several issues including a slow heart rate, left arm pain and abdominal pain Your blood work and EKG all look okay It is important that you follow-up with your GI specialist and primary care doctor to discuss today's visit and ongoing issues Return to the Emergency Department for severe weakness, chest pain, shortness of breath, episodes of fainting or any other concerns

## 2023-09-21 NOTE — ED Notes (Signed)
 Reviewed discharge instructions and follow-up care with pt. Pt verbalized understanding and had no further questions. Pt exited ED without complications.

## 2023-10-02 NOTE — Progress Notes (Unsigned)
 No chief complaint on file.   HPI: Alicia [name] 68 y.o. come in for  fu ED  fro 9 4     Medical Decision Making 68 year old female presenting for multiple subacute complaints.  Asymptomatic bradycardia, atraumatic left upper extremity pain and intermittent episodic lower abdominal pain.  No focal neurologic deficits on my exam.  No abdominal tenderness.  Bradycardic in the 50s sinus bradycardia.  Normotensive.  Very well-appearing.  Laboratory workup is reassuring with no significant abnormalities on metabolic panel or CBC.  UA negative for UTI.  EKG without evidence of ischemic changes.  She will follow-up with her GI doctor later this month and PCP as well for these ongoing issues.  Appropriate for discharge at this time.   ROS: See pertinent positives and negatives per HPI.  Past Medical History:  Diagnosis Date   Allergy    Cataract    removed bilateral -2014   Chronic tension headaches    Hx of varicella    Hypertension    Kidney stones    Near syncope    Nephrolithiasis    history of  passed on own.    Palpitations    Thyroid  disease    pt. denies 06/11/19   Tubular adenoma of colon 02/1997   w/ HGD   Varicose veins     Family History  Problem Relation Age of Onset   Cancer Mother        CERVICAL uterin cancer    Liver disease Mother        from chronic heaptitis    Diabetes Mother    Heart disease Father        avdisease avr   Stroke Father    Prostate cancer Father    Cancer Sister        Ovarian and endometrial   Cancer Maternal Aunt        Colon   Heart disease Maternal Grandfather    Heart disease Paternal Grandmother    Heart disease Paternal Grandfather    Diabetes Sister    Diabetes Brother    Colon cancer Paternal Aunt    Esophageal cancer Paternal Uncle    Rectal cancer Neg Hx    Stomach cancer Neg Hx     Social History   Socioeconomic History   Marital status: Married    Spouse name: Not on file   Number of children: 3    Years of education: 16   Highest education level: Not on file  Occupational History   Occupation: Magazine features editor: [employer]  Tobacco Use   Smoking status: Never   Smokeless tobacco: Never  Vaping Use   Vaping status: Never Used  Substance and Sexual Activity   Alcohol use: No   Drug use: No   Sexual activity: Yes    Partners: Male  Other Topics Concern   Not on file  Social History Narrative   [employer]- BA. 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 2 daughters- '82, '93, 1 son- '87. 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   Lives with her husband   2 dogs   orig from [employer]   Currently ob FMLA for dad awaiting valve surgery   Social Drivers of Health   Financial Resource Strain: Low Risk  (06/05/2023)   [facility]   Overall Financial Resource Strain (CARDIA)    Difficulty of Paying Living Expenses: Not hard at all  Food Insecurity: No Food Insecurity (06/05/2023)   Received from [facility]   Hunger Vital Sign    Within the past 12 months, you worried that your food would run out before you got the money to buy more.: Never true    Within the past 12 months, the food you bought just didn't last and you didn't have money to get more.: Never true  Transportation Needs: No Transportation Needs (06/05/2023)   Received from [facility] - Transportation    In the past 12 months, has lack of transportation kept you from medical appointments or from getting medications?: No    Lack of Transportation (Non-Medical): No  Physical Activity: Insufficiently Active (04/20/2022)   Exercise Vital Sign    Days of Exercise per Week: 2 days    Minutes of Exercise per Session: 60 min  Stress: No Stress Concern Present (04/20/2022)   [employer] of Occupational Health - Occupational Stress  Questionnaire    Feeling of Stress : Not at all  Social Connections: Moderately Isolated (04/20/2022)   Social Connection and Isolation Panel    Frequency of Communication with Friends and Family: More than three times a week    Frequency of Social Gatherings with Friends and Family: More than three times a week    Attends Religious Services: Never    Database administrator or Organizations: No    Attends Banker Meetings: Never    Marital Status: Married    Outpatient Medications Prior to Visit  Medication Sig Dispense Refill   benzonatate  (TESSALON ) 200 MG capsule Take by mouth.     chlorthalidone  (HYGROTON ) 25 MG tablet Take 1 tablet (25 mg total) by mouth daily. 90 tablet 3   Cholecalciferol (VITAMIN D3) 50 MCG (2000 UT) CHEW Chew 2,000 Units by mouth daily.     colestipol  (COLESTID ) 1 g tablet Take 1 tablet (1 g total) by mouth 2 (two) times daily. 60 tablet 5   Multiple Vitamin (MULTIVITAMIN WITH MINERALS) TABS tablet Take 1 tablet by mouth daily. Women's formula     spironolactone  (ALDACTONE ) 25 MG tablet Take 0.5 tablets (12.5 mg total) by mouth daily. 45 tablet 3   No facility-administered medications prior to visit.     EXAM:  There were no vitals taken for this visit.  There is no height or weight on file to calculate BMI.  GENERAL: vitals reviewed and listed above, alert, oriented, appears well hydrated and in no acute distress HEENT: atraumatic, conjunctiva  clear, no obvious abnormalities on inspection of external nose and ears OP : no lesion edema or exudate  NECK: no obvious masses on inspection palpation  LUNGS: clear to auscultation bilaterally, no wheezes, rales or rhonchi, good air movement CV: HRRR, no clubbing cyanosis or  peripheral edema nl cap refill  MS: moves all extremities without noticeable focal  abnormality PSYCH: pleasant and cooperative, no obvious depression or anxiety Lab Results  Component Value Date   WBC 6.3 09/21/2023   HGB  14.2 09/21/2023   HCT 40.9 09/21/2023   PLT 206 09/21/2023   GLUCOSE 90 09/21/2023   CHOL 201 (H) 12/01/2020   TRIG 128 12/01/2020   HDL 68 12/01/2020   LDLCALC 111 (H) 12/01/2020   ALT 18 09/21/2023   AST 27 09/21/2023   NA 139 09/21/2023   K  3.5 09/21/2023   CL 101 09/21/2023   CREATININE 0.66 09/21/2023   BUN 15 09/21/2023   CO2 26 09/21/2023   TSH 2.940 11/17/2022   INR 1.0 ratio 04/03/2009   HGBA1C 5.9 (H) 11/17/2022   BP Readings from Last 3 Encounters:  09/21/23 138/79  04/18/23 132/82  04/10/23 125/80   EKG: EKG Interpretation Date/Time:                  Thursday September 21 2023 14:01:09 EDT Ventricular Rate:         57 PR Interval:                 122 QRS Duration:             115 QT Interval:                 511 QTC Calculation:498 R Axis:                         27   Text Interpretation:Sinus rhythm Nonspecific intraventricular conduction delay Low voltage, precordial leads Borderline T abnormalities, anterior leads Confirmed by Pamella Sharper [phone]) on 09/21/2023 2:59:14 PM   ASSESSMENT AND PLAN:  Discussed the following assessment and plan:  No diagnosis found.  -Patient advised to return or notify health care team  if  new concerns arise.  There are no Patient Instructions on file for this visit.   Lailynn Southgate K. Shanzay Hepworth M.D.

## 2023-10-02 NOTE — Progress Notes (Deleted)
  Cardiology Office Note   Date:  10/02/2023  ID:  Alicia [name], DOB 1955-08-14, MRN [id number] PCP: Charlett Apolinar POUR, MD  [facility] Providers Cardiologist:  Alicia Gull, MD    History of Present Illness Alicia [name] is a 68 y.o. female with a past medical history of chest pain, HTN, chronic venous insufficiency, thyroid  disease, HLD. Patient is followed by Dr. Gull and presents today for an annual follow up appointment   Patient previously underwent echocardiogram 05/30/19 that showed EF 60-65%, no regional wall motion abnormalities, grade II DD, normal RV systolic function, normal PA systolic pressure, no significant valvular abnormalities.   Patient was last seen by Dr. Gull on 11/17/22. At that time, patient's BP was mildly elevated. Did not tolerate ARBs or amlodipine . Bradycardia precluded use of CA blockers or beta blockers.  HTN  - Management has been limited by previous intolerances to ARBs or amlodipine . Bradycardia does not allow for CCB or BB - Continue chlorthalidone  25 mg daily and spironolactone  12.5 mg daily - K 3.5 and creatinine 0.66 in 09/2023   HLD  - Lipid panel from 10/2022 showed LDL 108  -   ROS: ***  Studies Reviewed      *** Risk Assessment/Calculations {Does this patient have ATRIAL FIBRILLATION?[phone]} No BP recorded.  {Refresh Note OR Click here to enter BP  :1}***       Physical Exam VS:  There were no vitals taken for this visit.       Wt Readings from Last 3 Encounters:  04/18/23 243 lb 12.8 oz (110.6 kg)  04/10/23 238 lb (108 kg)  02/16/23 244 lb 6.4 oz (110.9 kg)    GEN: Well nourished, well developed in no acute distress NECK: No JVD; No carotid bruits CARDIAC: ***RRR, no murmurs, rubs, gallops RESPIRATORY:  Clear to auscultation without rales, wheezing or rhonchi  ABDOMEN: Soft, non-tender, non-distended EXTREMITIES:  No edema; No deformity   ASSESSMENT AND PLAN ***    {Are you ordering a CV Procedure  (e.g. stress test, cath, DCCV, TEE, etc)?   Press F2        :[phone]}  Dispo: ***  Signed, Rollo FABIENE Louder, PA-C

## 2023-10-03 ENCOUNTER — Ambulatory Visit (INDEPENDENT_AMBULATORY_CARE_PROVIDER_SITE_OTHER): Admitting: Internal Medicine

## 2023-10-03 ENCOUNTER — Encounter: Payer: Self-pay | Admitting: Internal Medicine

## 2023-10-03 ENCOUNTER — Ambulatory Visit (HOSPITAL_COMMUNITY)
Admission: RE | Admit: 2023-10-03 | Discharge: 2023-10-03 | Disposition: A | Discharge status: Home / Self Care | Source: Ambulatory Visit | Attending: Cardiovascular Disease | Admitting: Cardiovascular Disease

## 2023-10-03 VITALS — BP 140/90 | HR 55 | Temp 97.8°F | Ht 66.0 in | Wt 243.0 lb

## 2023-10-03 DIAGNOSIS — R011 Cardiac murmur, unspecified: Secondary | ICD-10-CM

## 2023-10-03 DIAGNOSIS — R1030 Lower abdominal pain, unspecified: Secondary | ICD-10-CM

## 2023-10-03 DIAGNOSIS — R42 Dizziness and giddiness: Secondary | ICD-10-CM | POA: Diagnosis present

## 2023-10-03 DIAGNOSIS — I10 Essential (primary) hypertension: Secondary | ICD-10-CM | POA: Insufficient documentation

## 2023-10-03 DIAGNOSIS — M79602 Pain in left arm: Secondary | ICD-10-CM

## 2023-10-03 DIAGNOSIS — R001 Bradycardia, unspecified: Secondary | ICD-10-CM | POA: Diagnosis present

## 2023-10-03 LAB — URINALYSIS, ROUTINE W REFLEX MICROSCOPIC
Bilirubin Urine: NEGATIVE
Hgb urine dipstick: NEGATIVE
Ketones, ur: NEGATIVE
Nitrite: NEGATIVE
Specific Gravity, Urine: 1.025 (ref 1.000–1.030)
Total Protein, Urine: NEGATIVE
Urine Glucose: NEGATIVE
Urobilinogen, UA: 1 (ref 0.0–1.0)
pH: 6 (ref 5.0–8.0)

## 2023-10-03 LAB — ECHOCARDIOGRAM COMPLETE
AR max vel: 2.29 cm2
AV Area VTI: 2.28 cm2
AV Area mean vel: 2.18 cm2
AV Mean grad: 12 mmHg
AV Peak grad: 21.5 mmHg
Ao pk vel: 2.32 m/s
Height: 66 in
S' Lateral: 3.35 cm
Weight: 3888 [oz_av]

## 2023-10-03 NOTE — Patient Instructions (Signed)
 I will  order echocardiogram  Keep cardiology appt.  They may want to get   a heart monitor also . Check urine today  r/o infection.  Bp was  borderline up  today.  May want  to bring bp monitor to your cards appt.   Lab Results  Component Value Date   WBC 6.3 09/21/2023   HGB 14.2 09/21/2023   HCT 40.9 09/21/2023   PLT 206 09/21/2023   GLUCOSE 90 09/21/2023   CHOL 201 (H) 12/01/2020   TRIG 128 12/01/2020   HDL 68 12/01/2020   LDLCALC 111 (H) 12/01/2020   ALT 18 09/21/2023   AST 27 09/21/2023   NA 139 09/21/2023   K 3.5 09/21/2023   CL 101 09/21/2023   CREATININE 0.66 09/21/2023   BUN 15 09/21/2023   CO2 26 09/21/2023   TSH 2.940 11/17/2022   INR 1.0 ratio 04/03/2009   HGBA1C 5.9 (H) 11/17/2022

## 2023-10-04 ENCOUNTER — Ambulatory Visit: Payer: Self-pay | Admitting: Internal Medicine

## 2023-10-04 LAB — URINE CULTURE
MICRO NUMBER:: 16974121
SPECIMEN QUALITY:: ADEQUATE

## 2023-10-04 NOTE — Progress Notes (Signed)
 Mild narrowing aortic valve  Left ventricle is pumping well.

## 2023-10-05 NOTE — Progress Notes (Signed)
 Urine not consistent with  UTI.abnormalities are probably not clinically significant

## 2023-10-09 ENCOUNTER — Ambulatory Visit: Admitting: Cardiology

## 2023-10-10 ENCOUNTER — Ambulatory Visit (INDEPENDENT_AMBULATORY_CARE_PROVIDER_SITE_OTHER): Admitting: Internal Medicine

## 2023-10-10 ENCOUNTER — Encounter: Payer: Self-pay | Admitting: Internal Medicine

## 2023-10-10 VITALS — BP 128/80 | HR 51 | Temp 98.1°F | Ht 65.75 in | Wt 241.6 lb

## 2023-10-10 DIAGNOSIS — R001 Bradycardia, unspecified: Secondary | ICD-10-CM

## 2023-10-10 DIAGNOSIS — Z860100 Personal history of colon polyps, unspecified: Secondary | ICD-10-CM

## 2023-10-10 DIAGNOSIS — Z79899 Other long term (current) drug therapy: Secondary | ICD-10-CM

## 2023-10-10 DIAGNOSIS — E7800 Pure hypercholesterolemia, unspecified: Secondary | ICD-10-CM

## 2023-10-10 DIAGNOSIS — I350 Nonrheumatic aortic (valve) stenosis: Secondary | ICD-10-CM

## 2023-10-10 DIAGNOSIS — K589 Irritable bowel syndrome without diarrhea: Secondary | ICD-10-CM | POA: Diagnosis not present

## 2023-10-10 DIAGNOSIS — I10 Essential (primary) hypertension: Secondary | ICD-10-CM

## 2023-10-10 LAB — COMPREHENSIVE METABOLIC PANEL WITH GFR
ALT: 16 U/L (ref 0–35)
AST: 20 U/L (ref 0–37)
Albumin: 4.2 g/dL (ref 3.5–5.2)
Alkaline Phosphatase: 64 U/L (ref 39–117)
BUN: 15 mg/dL (ref 6–23)
CO2: 29 meq/L (ref 19–32)
Calcium: 9.4 mg/dL (ref 8.4–10.5)
Chloride: 103 meq/L (ref 96–112)
Creatinine, Ser: 0.65 mg/dL (ref 0.40–1.20)
GFR: 91.02 mL/min (ref >60.00)
Glucose, Bld: 95 mg/dL (ref 70–99)
Potassium: 3.4 meq/L — ABNORMAL LOW (ref 3.5–5.1)
Sodium: 143 meq/L (ref 135–145)
Total Bilirubin: 0.9 mg/dL (ref 0.2–1.2)
Total Protein: 6.9 g/dL (ref 6.0–8.3)

## 2023-10-10 LAB — LIPID PANEL
Cholesterol: 188 mg/dL (ref 0–200)
HDL: 65 mg/dL (ref >39.00)
LDL Cholesterol: 105 mg/dL — ABNORMAL HIGH (ref 0–99)
NonHDL: 123.4
Total CHOL/HDL Ratio: 3
Triglycerides: 93 mg/dL (ref 0.0–149.0)
VLDL: 18.6 mg/dL (ref 0.0–40.0)

## 2023-10-10 LAB — HEMOGLOBIN A1C: Hgb A1c MFr Bld: 6 % (ref 4.6–6.5)

## 2023-10-10 LAB — TSH: TSH: 3.75 u[IU]/mL (ref 0.35–5.50)

## 2023-10-10 NOTE — Progress Notes (Signed)
 Chief Complaint  Patient presents with   Annual Exam    And to discuss echocardiogram    HPI: Patient  Alicia [name]  68 y.o. comes in today for yearly visit and fu from last visit  after  9 16 visit  no more lightheaded ness episodes  still has some low pulse rate . Cards appt is pending . Had echo showing mild AS nl ef lv  She reports father had  AS and 2 valve replacements   Health Maintenance  Topic Date Due   Medicare Annual Wellness (AWV)  04/20/2023   COVID-19 Vaccine (3 - Pfizer risk series) 10/26/2023 (Originally 05/04/2019)   Zoster Vaccines- Shingrix (1 of 2) 01/09/2024 (Originally 03/22/1975)   Mammogram  04/08/2024 (Originally 08/16/2020)   Influenza Vaccine  04/16/2024 (Originally 08/18/2023)   DTaP/Tdap/Td (2 - Tdap) 10/09/2024 (Originally 02/17/2013)   Pneumococcal Vaccine: 50+ Years (1 of 2 - PCV) 10/09/2024 (Originally 03/22/1975)   DEXA SCAN  10/09/2024 (Originally 03/21/2021)   Colonoscopy  04/27/2030   Hepatitis C Screening  Completed   HPV VACCINES  Aged Out   Meningococcal B Vaccine  Aged Out   Health Maintenance Review LIFESTYLE:  Exercise:  as poss Tobacco/ETS: n Alcohol:  n Sugar beverages:  hardly ever  Sleep: struggles  no hx of osa and thinks not Drug use: no Retired Chartered certified accountant   on immunotherapy  from [facility] for metastatic renal cancer to the brain . And  she travels to [place] a good bit and eventually will go to take over the farm   ROS:  gests IBS since cholescustectomey  also past hx of polyps and rectal bleed ( no more) GEN/ HEENT: No fever, significant weight changes sweats headaches vision problems hearing changes, CV/ PULM; No chest pain shortness of breath cough, syncope,edema  change in exercise tolerance. GI /GU: No adominal pain, vomiting, change in bowel habits. No blood in the stool. No significant GU symptoms. SKIN/HEME: ,no acute skin rashes suspicious lesions or bleeding. No lymphadenopathy, nodules,  masses.  NEURO/ PSYCH:  No neurologic signs such as weakness numbness. No depression anxiety. IMM/ Allergy: No unusual infections.  Allergy .   REST of 12 system review negative except as per HPI   Past Medical History:  Diagnosis Date   Allergy    Cataract    removed bilateral -2014   Chronic tension headaches    Hx of varicella    Hypertension    Kidney stones    Near syncope    Nephrolithiasis    history of  passed on own.    Palpitations    Thyroid  disease    pt. denies 06/11/19   Tubular adenoma of colon 02/1997   w/ HGD   Varicose veins     Past Surgical History:  Procedure Laterality Date   ABDOMINAL HYSTERECTOMY     fibroids non cancer    CATARACT EXTRACTION     Bilateral   cataract surgery Right 03/02/2012   CHOLECYSTECTOMY     KNEE SURGERY  04/2020   KNEE SURGERY     ROTATOR CUFF REPAIR Right    TONSILLECTOMY      Family History  Problem Relation Age of Onset   Cancer Mother        CERVICAL uterin cancer    Liver disease Mother        from chronic heaptitis    Diabetes Mother    Heart disease Father  avdisease avr   Stroke Father    Prostate cancer Father    Cancer Sister        Ovarian and endometrial   Cancer Maternal Aunt        Colon   Heart disease Maternal Grandfather    Heart disease Paternal Grandmother    Heart disease Paternal Grandfather    Diabetes Sister    Diabetes Brother    Colon cancer Paternal Aunt    Esophageal cancer Paternal Uncle    Rectal cancer Neg Hx    Stomach cancer Neg Hx     Social History   Socioeconomic History   Marital status: Married    Spouse name: Not on file   Number of children: 3   Years of education: 16   Highest education level: Not on file  Occupational History   Occupation: Magazine features editor: [employer]  Tobacco Use   Smoking status: Never   Smokeless tobacco: Never  Vaping Use   Vaping status: Never Used  Substance and Sexual Activity   Alcohol use: No   Drug  use: No   Sexual activity: Yes    Partners: Male  Other Topics Concern   Not on file  Social History Narrative   [employer]- BA. 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 2 daughters- '82, '93, 1 son- '87. 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   Overall Financial Resource Strain (CARDIA)    Difficulty of Paying Living Expenses: Not hard at all  Food Insecurity: No Food Insecurity (06/05/2023)   Received from [facility]   Hunger Vital Sign    Within the past 12 months, you worried that your food would run out before you got the money to buy more.: Never true    Within the past 12 months, the food you bought just didn't last and you didn't have money to get more.: Never true  Transportation Needs: No Transportation Needs (06/05/2023)   Received from [facility]    In the past 12 months, has lack of transportation kept you from medical appointments or from getting medications?: No    Lack of Transportation (Non-Medical): No  Physical Activity: Insufficiently Active (04/20/2022)   Exercise Vital Sign    Days of Exercise per Week: 2 days    Minutes of Exercise per Session: 60 min  Stress: No Stress Concern Present (04/20/2022)   [employer] of Occupational Health - Occupational Stress Questionnaire    Feeling of Stress : Not at all  Social Connections: Moderately Isolated (04/20/2022)   Social Connection and Isolation Panel    Frequency of Communication with Friends and Family: More than three times a week    Frequency of Social Gatherings with Friends and Family: More than three times a  week    Attends Religious Services: Never    Database administrator or Organizations: No    Attends Banker Meetings: Never    Marital Status: Married    Outpatient Medications Prior  to Visit  Medication Sig Dispense Refill   chlorthalidone  (HYGROTON ) 25 MG tablet Take 1 tablet (25 mg total) by mouth daily. 90 tablet 3   Cholecalciferol (VITAMIN D3) 50 MCG (2000 UT) CHEW Chew 2,000 Units by mouth daily.     Multiple Vitamin (MULTIVITAMIN WITH MINERALS) TABS tablet Take 1 tablet by mouth daily. Women's formula     spironolactone  (ALDACTONE ) 25 MG tablet Take 0.5 tablets (12.5 mg total) by mouth daily. 45 tablet 3   benzonatate  (TESSALON ) 200 MG capsule Take by mouth. (Patient not taking: Reported on 10/10/2023)     colestipol  (COLESTID ) 1 g tablet Take 1 tablet (1 g total) by mouth 2 (two) times daily. (Patient not taking: Reported on 10/10/2023) 60 tablet 5   No facility-administered medications prior to visit.     EXAM:  BP 128/80 (BP Location: Left Arm, Patient Position: Sitting, Cuff Size: Large)   Pulse (!) 51   Temp 98.1 F (36.7 C) (Oral)   Ht 5' 5.75 (1.67 m)   Wt 241 lb 9.6 oz (109.6 kg)   SpO2 97%   BMI 39.29 kg/m   Body mass index is 39.29 kg/m. Wt Readings from Last 3 Encounters:  10/10/23 241 lb 9.6 oz (109.6 kg)  10/03/23 243 lb (110.2 kg)  04/18/23 243 lb 12.8 oz (110.6 kg)   BP Readings from Last 3 Encounters:  10/10/23 128/80  10/03/23 (!) 140/90  09/21/23 138/79     Physical Exam: Vital signs reviewed HZW:Uypd is a well-developed well-nourished alert cooperative    who appearsr stated age in no acute distress.  HEENT: normocephalic atraumatic , Eyes: PERRL EOM's full, conjunctiva clear, Nares: paten,t no deformity discharge or tenderness., Ears: no deformity EAC's clear TMs with normal landmarks. Mouth: clear OP, no lesions, edema.  Moist mucous membranes. Dentition in adequate repair. NECK: supple without masses, thyromegaly or  bruits. CHEST/PULM:  Clear to auscultation and percussion breath sounds equal no wheeze , rales or rhonchi. No chest wall deformities or tenderness. Breast: normal by inspection . No dimpling, discharge, masses, tenderness or discharge . CV: PMI is nondisplaced, S1 S2 no gallops,  soft 2/6 sem upper SBs ,no rubs. Peripheral pulses are full without delay.No JVD .  ABDOMEN: Bowel sounds normal nontender  No guard or rebound, no hepato splenomegal no CVA tenderness.  Extremtities:  No clubbing cyanosis or edema, no acute joint swelling or redness no focal atrophy NEURO:  Oriented x3, cranial nerves 3-12 appear to be intact, no obvious focal weakness,gait within normal limits no abnormal reflexes or asymmetrical SKIN: No acute rashes normal turgor, color, no bruising or petechiae. PSYCH: Oriented, good eye contact, no obvious depression anxiety, cognition and judgment appear normal. LN: no cervical axillary adenopathy  Lab Results  Component Value Date   WBC 6.3 09/21/2023   HGB 14.2 09/21/2023   HCT 40.9 09/21/2023   PLT 206 09/21/2023   GLUCOSE 95 10/10/2023   CHOL 188 10/10/2023   TRIG 93.0 10/10/2023   HDL 65.00 10/10/2023   LDLCALC 105 (H) 10/10/2023   ALT 16 10/10/2023   AST 20 10/10/2023   NA 143 10/10/2023   K 3.4 (L) 10/10/2023   CL 103 10/10/2023   CREATININE 0.65 10/10/2023   BUN 15 10/10/2023   CO2 29 10/10/2023   TSH 3.75 10/10/2023   INR 1.0 ratio 04/03/2009   HGBA1C 6.0 10/10/2023    BP Readings from Last 3 Encounters:  10/10/23 128/80  10/03/23 (!) 140/90  09/21/23 138/79   Last  vitamin D  Lab Results  Component Value Date   VD25OH 34.3 11/17/2022    Lab results  and plan reviewed with patient   ASSESSMENT AND PLAN:  Discussed the following assessment and plan:    ICD-10-CM   1. Essential hypertension  I10 Comprehensive metabolic panel with GFR    Lipid panel    TSH    Hemoglobin A1c    2. Bradycardia  R00.1 Comprehensive metabolic panel with GFR     Lipid panel    TSH    Hemoglobin A1c    3. Irritable bowel syndrome, unspecified type  K58.9 Comprehensive metabolic panel with GFR    Lipid panel    TSH    Hemoglobin A1c    4. Medication management  Z79.899 Comprehensive metabolic panel with GFR    Lipid panel    TSH    Hemoglobin A1c    5. Elevated LDL cholesterol level  E78.00     6. History of colon polyps  Z86.0100     7. Mild aortic stenosis  I35.0    by echo and  exam  seems no sx  fam hx of AVR in father     Lab today   Cards fu  as before  no more alarm sx  Eventually may have to move cause of borhters health  and transfer care to a [facility] system  Declined vaccines today  Return for depending on results and yearly .  Patient Care Team: Haelie Clapp, Apolinar POUR, MD as PCP - General (Internal Medicine) Okey Vina GAILS, MD as PCP - Cardiology (Cardiology) Early, Krystal FALCON, MD (Inactive) (Vascular Surgery) Estelle Service, MD as Consulting Physician (Obstetrics and Gynecology) Carrie Dunnings, MD as Consulting Physician (Ophthalmology) Wittstein, Jocelyn, MD (Orthopedic Surgery) Clifford, Susann L (Inactive) as Referring Physician Patient Instructions  Good to see you today . Update labs. Mild aortic stenosis  prob not causing sx  Fu cardiology as planned .   Frankee Gritz K. Isael Stille M.D.

## 2023-10-10 NOTE — Patient Instructions (Addendum)
 Good to see you today . Update labs. Mild aortic stenosis  prob not causing sx  Fu cardiology as planned .

## 2023-10-12 ENCOUNTER — Encounter: Payer: Self-pay | Admitting: Internal Medicine

## 2023-10-16 ENCOUNTER — Telehealth: Payer: Self-pay | Admitting: Internal Medicine

## 2023-10-16 ENCOUNTER — Ambulatory Visit: Payer: Self-pay | Admitting: *Deleted

## 2023-10-16 DIAGNOSIS — R109 Unspecified abdominal pain: Secondary | ICD-10-CM

## 2023-10-16 NOTE — Telephone Encounter (Signed)
 Follow up with pt. Pt states she wants to follow up on referral request to GI that she sent on mychart message. Pt states she is not having abdomen pain currently. But did had worse episode on Thursday with dizzy, sweating. Had another episode on Friday but wasn't full blown. She continue to say that she had little stomach pain on and off. Currently she is not having the sx.   Inform pt that mychart message was sent to Dr. Charlett. She will get to it when she is in office tomorrow. Pt verbalized understanding.   Please review message below for more information.

## 2023-10-16 NOTE — Telephone Encounter (Signed)
 Copied from CRM [id number]. Topic: Referral - Request for Referral >> Oct 16, 2023  1:51 PM Robinson H wrote: Did the patient discuss referral with their provider in the last year? Yes (If No - schedule appointment) (If Yes - send message)  Appointment offered? No  Type of order/referral and detailed reason for visit: Having some recent stomach pain, and vasal vagal issues she's spoken with provider about [facility]  Preference of office, provider, location: [facility]   If referral order, have you been seen by this specialty before? Yes, (If Yes, this issue or another issue? When? Where? 04/2023 Dr. French Fill [phone] Colonoscopy Dr. Aneita in [place] 2021but he retired  Can we respond through MyChart? Yes

## 2023-10-16 NOTE — Telephone Encounter (Signed)
   FYI Only or Action Required?: Action required by provider: request for appointment and referral request.  Patient was last seen in primary care on 10/10/2023 by Panosh, Apolinar POUR, MD.  Called Nurse Triage reporting Abdominal Pain.  Symptoms began several days ago.  Interventions attempted: Rest, hydration, or home remedies.  Symptoms are: gradually worsening.  Triage Disposition: See HCP Within 4 Hours (Or PCP Triage)  Patient/caregiver understands and will follow disposition?: No, wishes to speak with PCP   Recommended UC/ED due to pulse 48 bpm. No c/o chest pain no difficulty breathing no sweating no other sx. Please advise.              Copied from CRM #[id number]. Topic: Clinical - Red Word Triage >> Oct 16, 2023  1:57 PM Robinson H wrote: [facility] that prompted transfer to Nurse Triage: Having stomach pains with bathroom episodes Reason for Disposition  [1] MILD-MODERATE pain AND [2] constant AND [3] present > 2 hours  Answer Assessment - Initial Assessment Questions Recommended UC/ED due to pulse rate 48 bpm. Patient calling to request GI referral. And constant mild mid abdominal pain. Intensity comes and goes. Last OV 10/10/23 pulse 51. Today reports 48 bpm. Mild dizziness. Patient requesting call back from PCP prior to going to ED. No available appt today . Denies chest pain no difficulty breathing no fainting episodes today.  CAL notified patient did not want to go to UC/ED.     1. LOCATION: Where does it hurt?      Middle abdomen  2. RADIATION: Does the pain shoot anywhere else? (e.g., chest, back)     Dull intense pain when it occurs like need to vomit or have diarrhea .  3. ONSET: When did the pain begin? (e.g., minutes, hours or days ago)      Last Thursday , loose BMs then severe abdominal cramping and pain 4. SUDDEN: Gradual or sudden onset?     Sudden  5. PATTERN Does the pain come and go, or is it constant?     Comes and goes. 6. SEVERITY:  How bad is the pain?  (e.g., Scale 1-10; mild, moderate, or severe)     Mild  7. RECURRENT SYMPTOM: Have you ever had this type of stomach pain before? If Yes, ask: When was the last time? and What happened that time?      Yes  8. CAUSE: What do you think is causing the stomach pain? (e.g., gallstones, recent abdominal surgery)     Not sure  9. RELIEVING/AGGRAVATING FACTORS: What makes it better or worse? (e.g., antacids, bending or twisting motion, bowel movement)     Having BM 10. OTHER SYMPTOMS: Do you have any other symptoms? (e.g., back pain, diarrhea, fever, urination pain, vomiting)       Vagal episode last Thursday. Tingling in fingers and sweating during diarrheal episode. Again on Saturday but did not pass out. Eating bland diet . Lightheaded today. Mild abdominal pain today middle abdomen. Pulse 48 bpm today 11. PREGNANCY: Is there any chance you are pregnant? When was your last menstrual period?       na  Protocols used: Abdominal Pain - Female-A-AH

## 2023-10-17 ENCOUNTER — Ambulatory Visit: Payer: Self-pay | Admitting: Internal Medicine

## 2023-10-17 DIAGNOSIS — E876 Hypokalemia: Secondary | ICD-10-CM

## 2023-10-17 NOTE — Telephone Encounter (Signed)
 GI referral was placed. Please see other encounter.

## 2023-10-17 NOTE — Addendum Note (Signed)
 Addended by: Cash Duce on: 10/17/2023 10:50 AM   Modules accepted: Orders

## 2023-10-17 NOTE — Telephone Encounter (Signed)
 I ordered gi referral ( prev had seen dr Aneita )  for recurrent abd pain .    Team can see her again if needed  in interim .

## 2023-10-17 NOTE — Telephone Encounter (Signed)
 Follow up with pt and inform her GI referral order is placed.   Pt request order to be refer to [facility] with provider that she included in mychart message. Order was addenum per pt request.   Offer appointment to pt. She states she is feeling fine and doesn't need an appointment at this moment. Advise pt to contact if she has any concerns. Pt verbalized understanding.

## 2023-10-17 NOTE — Telephone Encounter (Signed)
 Whoops I didn't see this separate message . Please do this referral  and cancel the one to   GI team

## 2023-10-17 NOTE — Progress Notes (Signed)
 Thyroid  nl, A1c in prediabetic range  kidney function normal .  Potassium is borderline low again .( From the diuretic chlorthalidone  med)    we have to be cautious with potassium supplements cause the aldactone  raises  potassium but  suggest we try  Klc 10 meq  per day for 3 days and then  once a day 3 days per week ( disp 30 )  Alternatively take and otc  supplement usually 99 mg  Check BMP after 1 week  (dont have to fast )

## 2023-10-19 MED ORDER — POTASSIUM CHLORIDE ER 10 MEQ PO TBCR: EXTENDED_RELEASE_TABLET | ORAL | 0 refills | Status: DC

## 2023-10-20 ENCOUNTER — Telehealth: Payer: Self-pay | Admitting: Internal Medicine

## 2023-10-20 NOTE — Telephone Encounter (Signed)
A referral was already placed.

## 2023-10-20 NOTE — Telephone Encounter (Signed)
 Copied from CRM [id number]. Topic: Referral - Request for Referral >> Oct 16, 2023  1:51 PM Robinson H wrote: Did the patient discuss referral with their provider in the last year? Yes (If No - schedule appointment) (If Yes - send message)  Appointment offered? No  Type of order/referral and detailed reason for visit: Having some recent stomach pain, and vasal vagal issues she's spoken with provider about [facility]  Preference of office, provider, location: [facility]   If referral order, have you been seen by this specialty before? Yes, (If Yes, this issue or another issue? When? Where? 04/2023 Dr. French Fill [phone] Colonoscopy Dr. Aneita in [place] 2021but he retired  Can we respond through MyChart? Yes >> Oct 20, 2023  3:32 PM Franky GRADE wrote: Patient is calling with updated fax number for where we need to send the referral to [phone].

## 2023-10-24 ENCOUNTER — Telehealth: Payer: Self-pay | Admitting: *Deleted

## 2023-10-24 NOTE — Telephone Encounter (Signed)
 Copied from CRM #[id number]. Topic: Referral - Status >> Oct 24, 2023 12:20 PM Shereese L wrote: Reason for CRM: Patient stated that the referral needs to be sent to fax# [phone] for [facility] Patient would like a call for the referral status

## 2023-10-27 ENCOUNTER — Other Ambulatory Visit

## 2023-10-31 NOTE — Progress Notes (Unsigned)
 Cardiology Office Note:    Date:  11/02/2023   ID:  Alicia [name], DOB 1955-07-05, MRN [id number]  PCP:  Charlett Apolinar POUR, MD   [facility] Providers Cardiologist:  Vina Gull, MD Cardiology APP:  Madie Jon Garre, PA     Referring MD: Charlett Apolinar POUR, MD   Chief Complaint  Patient presents with   Follow-up    ER visit, bradycardia, left arm pain    History of Present Illness:    Alicia [name] is a 68 y.o. female with a hx of atypical chest pain, palpitations, HFpEF, HTN, HLD, hepatic steatosis, sinus bradycardia.   She was seen in the ER 09/21/23 for abdominal pain, referred to GI OP.  Most recent echo 09/2023 demonstrated LVEF 60-65%, mild LVH, normal RV function, trivial MR, mild AS.   She presents for cardiology follow up. She states she has experienced bradycardia on her kardia mobile in the 62s. She obtains a anguilla mobile reading if her pulse ox reads low heart rate when she checks her BP - not necessarily symptomatic. No pre syncope or syncope.  She also reports left arm dull ache that prompted ER evaluation with the abdominal pain. She has not had left arm pain since. She continues to have intermittent abdominal pain with pre-syncope - has almost passed out with bowel movements.   She is now on potassium supplements after the ER visit along with spironolactone  with improved HR from the 40s to 50s.   She is also concerned about the aortic stenosis because her father had two AVR's.   She remains active with swimming and light weights. She is unable to walk far distances on the treadmill due to prior knee replacement. Left arm pain was not precipitated with weights or in the pool.     Past Medical History:  Diagnosis Date   Allergy    Cataract    removed bilateral -2014   Chronic tension headaches    Hx of varicella    Hypertension    Kidney stones    Near syncope    Nephrolithiasis    history of  passed on own.    Palpitations    Thyroid   disease    pt. denies 06/11/19   Tubular adenoma of colon 02/1997   w/ HGD   Varicose veins     Past Surgical History:  Procedure Laterality Date   ABDOMINAL HYSTERECTOMY     fibroids non cancer    CATARACT EXTRACTION     Bilateral   cataract surgery Right 03/02/2012   CHOLECYSTECTOMY     KNEE SURGERY  04/2020   KNEE SURGERY     ROTATOR CUFF REPAIR Right    TONSILLECTOMY      Current Medications: Current Meds  Medication Sig   benzonatate  (TESSALON ) 200 MG capsule Take by mouth.   chlorthalidone  (HYGROTON ) 25 MG tablet Take 1 tablet (25 mg total) by mouth daily.   Cholecalciferol (VITAMIN D3) 50 MCG (2000 UT) CHEW Chew 2,000 Units by mouth daily.   colestipol  (COLESTID ) 1 g tablet Take 1 tablet (1 g total) by mouth 2 (two) times daily.   Multiple Vitamin (MULTIVITAMIN WITH MINERALS) TABS tablet Take 1 tablet by mouth daily. Women's formula   potassium chloride  (KLOR-CON  10) 10 MEQ tablet Klc 10 meq  per day for 3 days and then  once a day 3 days per week. Okay to dispense 30 tab.   spironolactone  (ALDACTONE ) 25 MG tablet Take 0.5 tablets (12.5 mg total)  by mouth daily.     Allergies:   Ciprofloxacin  hcl, Iodinated contrast media, Iohexol, Lidocaine hcl, Morphine, Sulfa antibiotics, Prednisone , Valsartan , Shellfish allergy, Sulfur, Ciprofloxacin , Penicillins, Sulfamethoxazole-trimethoprim, and Sulfonamide derivatives   Social History   Socioeconomic History   Marital status: Married    Spouse name: Not on file   Number of children: 3   Years of education: 16   Highest education level: Not on file  Occupational History   Occupation: Magazine features editor: [employer]  Tobacco Use   Smoking status: Never   Smokeless tobacco: Never  Vaping Use   Vaping status: Never Used  Substance and Sexual Activity   Alcohol use: No   Drug use: No   Sexual activity: Yes    Partners: Male  Other Topics Concern   Not on file  Social History Narrative   [employer]-  BA. 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 2 daughters- '82, '93, 1 son- '87. 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   Overall Financial Resource Strain (CARDIA)    Difficulty of Paying Living Expenses: Not hard at all  Food Insecurity: No Food Insecurity (06/05/2023)   Received from [facility]   Hunger Vital Sign    Within the past 12 months, you worried that your food would run out before you got the money to buy more.: Never true    Within the past 12 months, the food you bought just didn't last and you didn't have money to get more.: Never true  Transportation Needs: No Transportation Needs (06/05/2023)   Received from [facility] - Transportation    In the past 12 months, has lack of transportation kept you from medical appointments or from getting medications?: No    Lack of Transportation (Non-Medical): No  Physical Activity: Insufficiently Active (04/20/2022)   Exercise Vital Sign    Days of Exercise per Week: 2 days    Minutes of Exercise per Session: 60 min  Stress: No Stress Concern Present (04/20/2022)   [employer] of Occupational Health - Occupational Stress Questionnaire    Feeling of Stress : Not at all  Social Connections: Moderately Isolated (04/20/2022)   Social Connection and Isolation Panel    Frequency of Communication with Friends and Family: More than three times a week    Frequency of Social Gatherings with Friends and Family: More than three times a week    Attends Religious Services: Never    Database administrator or Organizations: No    Attends Engineer, structural:  Never    Marital Status: Married     Family History: The patient's family history includes Cancer in her maternal aunt, mother, and sister; Colon cancer in her paternal aunt; Diabetes in her brother, mother, and sister; Esophageal cancer in her paternal uncle; Heart disease in her father, maternal grandfather, paternal grandfather, and paternal grandmother; Liver disease in her mother; Prostate cancer in her father; Stroke in her father. There is no history of Rectal cancer or Stomach cancer.  ROS:   Please see the history of present illness.     All other systems reviewed and are negative.  EKGs/Labs/Other Studies Reviewed:    The following studies were reviewed today:  EKG Interpretation Date/Time:  Thursday November 02 2023 08:32:17 EDT Ventricular Rate:  52 PR Interval:  160 QRS Duration:  96 QT Interval:  474 QTC Calculation: 440 R Axis:   -9  Text Interpretation: Sinus bradycardia Cannot rule out Anterior infarct , age undetermined When compared with ECG of 21-Sep-2023 14:01, PREVIOUS ECG IS PRESENT Confirmed by Madie Slough ([id number]) on 11/02/2023 8:34:01 AM    Recent Labs: 09/21/2023: Hemoglobin 14.2; Platelets 206 10/10/2023: ALT 16; TSH 3.75 11/01/2023: BUN 17; Creatinine, Ser 0.64; Potassium 3.8; Sodium 141  Recent Lipid Panel    Component Value Date/Time   CHOL 188 10/10/2023 0941   CHOL 201 (H) 12/01/2020 1159   TRIG 93.0 10/10/2023 0941   HDL 65.00 10/10/2023 0941   HDL 68 12/01/2020 1159   CHOLHDL 3 10/10/2023 0941   VLDL 18.6 10/10/2023 0941   LDLCALC 105 (H) 10/10/2023 0941   LDLCALC 111 (H) 12/01/2020 1159     Risk Assessment/Calculations:                Physical Exam:    VS:  BP 138/74   Pulse (!) 55   Ht 5' 6 (1.676 m)   Wt 240 lb (108.9 kg)   SpO2 97%   BMI 38.74 kg/m     Wt Readings from Last 3 Encounters:  11/02/23 240 lb (108.9 kg)  10/10/23 241 lb 9.6 oz (109.6 kg)  10/03/23 243 lb (110.2 kg)     GEN:  Well nourished, well  developed in no acute distress HEENT: Normal NECK: No JVD; No carotid bruits LYMPHATICS: No lymphadenopathy CARDIAC: RRR, no murmurs, rubs, gallops RESPIRATORY:  Clear to auscultation without rales, wheezing or rhonchi  ABDOMEN: Soft, non-tender, non-distended MUSCULOSKELETAL:  No edema; No deformity  SKIN: Warm and dry NEUROLOGIC:  Alert and oriented x 3 PSYCHIATRIC:  Normal affect   ASSESSMENT:    1. Bradycardia   2. Essential hypertension, benign   3. Hypokalemia   4. Pre-syncope   5. Atypical chest pain   6. Palpitations   7. Aortic valve stenosis, etiology of cardiac valve disease unspecified   8. Hyperlipidemia with target LDL less than 70   9. Near syncope   10. Diabetes mellitus screening    PLAN:    In order of problems listed above:  Left arm pain Abdominal pain - she had a history of anaphylaxis in with contrast 10 years ago - she would like to avoid contrast if possible - she would be unable to complete a treadmills tress test - will obtain PET/CT stress test at [facility] - she understand that should the stress test return results concerning for ischemia, we would then consider a heart cath with pre-medication for contrast allergy - she is in agreement   Hypertension - continue 25 mg chlorthalidone , 12.5 mg spironolactone  - recent BMP in ER with mild hypokalemia  - supplemented with 10 mEq K three times weekly by PCP, BMP yesterday stable - BP is normotensive - continue spironolactone    Mild aortic stenosis - no SOB, hypervolemia - she is concerned given her father's  history - plan to repeat in 1 year   Hyperlipidemia with LDL goal < 70 10/10/2023: Cholesterol 188; HDL 65.00; LDL Cholesterol 105; Triglycerides 93.0; VLDL 18.6 - will await PET stress test and adjust  goal - currently not on lipid lowering medications   Prediabetes  - last A1c 6.0%   Abdominal pain - was referred to GI   Follow up with Dr. Okey in 6 weeks.      Informed Consent    Shared Decision Making/Informed Consent The risks [chest pain, shortness of breath, cardiac arrhythmias, dizziness, blood pressure fluctuations, myocardial infarction, stroke/transient ischemic attack, nausea, vomiting, allergic reaction, radiation exposure, metallic taste sensation and life-threatening complications (estimated to be 1 in 10,000)], benefits (risk stratification, diagnosing coronary artery disease, treatment guidance) and alternatives of a cardiac PET stress test were discussed in detail with Ms. Walthers and she agrees to proceed.       Medication Adjustments/Labs and Tests Ordered: Current medicines are reviewed at length with the patient today.  Concerns regarding medicines are outlined above.  Orders Placed This Encounter  Procedures   NM PET CT CARDIAC PERFUSION MULTI W/ABSOLUTE BLOODFLOW   Cardiac Stress Test: Informed Consent Details: Physician/Practitioner Attestation; Transcribe to consent form and obtain patient signature   LONG TERM MONITOR (3-14 DAYS)   EKG 12-Lead   No orders of the defined types were placed in this encounter.   Patient Instructions  Medication Instructions:  Your physician recommends that you continue on your current medications as directed. Please refer to the Current Medication list given to you today.  *If you need a refill on your cardiac medications before your next appointment, please call your pharmacy*   Testing/Procedures: Your physician has requested you have a cardiac PET stress CT scan performed. These are completed at [facility]. A scheduler will call you to schedule an appointment to have this test completed.      ZIO XT- Long Term Monitor Instructions  Your physician has requested you wear a ZIO patch monitor for 14 days.  This is a single patch monitor. [employer] supplies one patch monitor per enrollment. Additional stickers are not available. Please do not apply patch if you will be having a Nuclear Stress Test,   Echocardiogram, Cardiac CT, MRI, or Chest Xray during the period you would be wearing the  monitor. The patch cannot be worn during these tests. You cannot remove and re-apply the  ZIO XT patch monitor.  Billing and Patient Assistance Program Information  We have supplied [employer] with any of your insurance information on file for billing purposes. [employer] offers a sliding scale Patient Assistance Program for patients that do not have  insurance, or whose insurance does not completely cover the cost of the ZIO monitor.  You must apply for the Patient Assistance Program to qualify for this discounted rate.  To apply, please call [employer] at [phone], select option 4, select option 2, ask to apply for  Patient Assistance Program. Meredeth will ask your household income, and how many people  are in your household. They will quote your out-of-pocket cost based on that information.  [employer] will also be able to set up a 40-month, interest-free payment plan if needed.  When you are ready to remove the patch, follow instructions on the last 2 pages of Patient  Logbook. Stick patch monitor onto the last page of Patient Logbook.  Place Patient Logbook in the blue and white box. Use locking tab on box and tape box closed  securely. The blue and white box has prepaid postage on it. Please place it in the mailbox as  soon as possible. Your physician should have your test results approximately 7 days after the  monitor has  been mailed back to [place].  Call [facility] Customer Care at [phone] if you have questions regarding  your ZIO XT patch monitor. Call them immediately if you see an orange light blinking on your  monitor.  If your monitor falls off in less than 4 days, contact our Monitor department at [phone].  If your monitor becomes loose or falls off after 4 days call [employer] at [phone] for  suggestions on securing your monitor      Follow-Up: At [facility], you and your health needs are our priority.  As part of our continuing mission to provide you with exceptional heart care, our providers are all part of one team.  This team includes your primary Cardiologist (physician) and Advanced Practice Providers or APPs (Physician Assistants and Nurse Practitioners) who all work together to provide you with the care you need, when you need it.  Your next appointment:   4-6 week(s)  Provider:   Vina Gull, MD       Please report to Radiology at [facility] Main Entrance, medical mall, 30 mins prior to your test.  [address]  [place], [place]  How to Prepare for Your Cardiac PET/CT Stress Test:  Nothing to eat or drink, except water, 3 hours prior to arrival time.  NO caffeine/decaffeinated products, or chocolate 12 hours prior to arrival. (Please note decaffeinated beverages (teas/coffees) still contain caffeine).  If you have caffeine within 12 hours prior, the test will need to be rescheduled.  Medication instructions: Do not take erectile dysfunction medications for 72 hours prior to test (sildenafil, tadalafil) Do not take nitrates (isosorbide mononitrate, Ranexa) the day before or day of test Do not take tamsulosin the day before or morning of test Hold theophylline containing medications for 12 hours. Hold Dipyridamole 48 hours prior to the test.  Diabetic Preparation: If able to eat breakfast prior to 3 hour fasting, you may take all medications, including your insulin . Do not worry if you miss your breakfast dose of insulin  - start at your next meal. If you do not eat prior to 3 hour fast-Hold all diabetes (oral and insulin ) medications. Patients who wear a continuous glucose monitor MUST remove the device prior to scanning.  You may take your remaining medications with water.  NO perfume, cologne or lotion on chest or abdomen area. FEMALES - Please avoid wearing dresses to this  appointment.  Total time is 1 to 2 hours; you may want to bring reading material for the waiting time.  IF YOU THINK YOU MAY BE PREGNANT, OR ARE NURSING PLEASE INFORM THE TECHNOLOGIST.  In preparation for your appointment, medication and supplies will be purchased.  Appointment availability is limited, so if you need to cancel or reschedule, please call the Radiology Department Scheduler at [phone] 24 hours in advance to avoid a cancellation fee of $100.00  What to Expect When you Arrive:  Once you arrive and check in for your appointment, you will be taken to a preparation room within the Radiology Department.  A technologist or Nurse will obtain your medical history, verify that you are correctly prepped for the exam, and explain the procedure.  Afterwards, an IV will be started in your arm and electrodes will be placed on your skin for EKG monitoring during the stress portion of the exam. Then you will be escorted to the PET/CT scanner.  There, staff will get you positioned on the scanner and obtain a blood pressure and EKG.  During the exam, you will continue to be connected to the EKG and blood pressure machines.  A small, safe amount of a radioactive tracer will be injected in your IV to obtain a series of pictures of your heart along with an injection of a stress agent.    After your Exam:  It is recommended that you eat a meal and drink a caffeinated beverage to counter act any effects of the stress agent.  Drink plenty of fluids for the remainder of the day and urinate frequently for the first couple of hours after the exam.  Your doctor will inform you of your test results within 7-10 business days.  For more information and frequently asked questions, please visit our website: [email or web address]  For questions about your test or how to prepare for your test, please call: Cardiac Imaging Nurse Navigators Office: [phone]        Signed, Jon Nat Hails,  [place]  11/02/2023 9:10 AM    [facility]

## 2023-11-01 ENCOUNTER — Other Ambulatory Visit (INDEPENDENT_AMBULATORY_CARE_PROVIDER_SITE_OTHER)

## 2023-11-01 ENCOUNTER — Ambulatory Visit: Payer: Self-pay | Admitting: Internal Medicine

## 2023-11-01 DIAGNOSIS — E876 Hypokalemia: Secondary | ICD-10-CM

## 2023-11-01 LAB — BASIC METABOLIC PANEL WITH GFR
BUN: 17 mg/dL (ref 6–23)
CO2: 29 meq/L (ref 19–32)
Calcium: 9.1 mg/dL (ref 8.4–10.5)
Chloride: 102 meq/L (ref 96–112)
Creatinine, Ser: 0.64 mg/dL (ref 0.40–1.20)
GFR: 91.32 mL/min (ref >60.00)
Glucose, Bld: 102 mg/dL — ABNORMAL HIGH (ref 70–99)
Potassium: 3.8 meq/L (ref 3.5–5.1)
Sodium: 141 meq/L (ref 135–145)

## 2023-11-01 NOTE — Progress Notes (Signed)
 Potassium now in normal range  . Please update med list as to potassium supplementation  she is currently taking

## 2023-11-02 ENCOUNTER — Encounter: Payer: Self-pay | Admitting: Physician Assistant

## 2023-11-02 ENCOUNTER — Ambulatory Visit

## 2023-11-02 ENCOUNTER — Ambulatory Visit: Attending: Cardiology | Admitting: Physician Assistant

## 2023-11-02 VITALS — BP 138/74 | HR 55 | Ht 66.0 in | Wt 240.0 lb

## 2023-11-02 DIAGNOSIS — I10 Essential (primary) hypertension: Secondary | ICD-10-CM | POA: Diagnosis not present

## 2023-11-02 DIAGNOSIS — R0789 Other chest pain: Secondary | ICD-10-CM

## 2023-11-02 DIAGNOSIS — E876 Hypokalemia: Secondary | ICD-10-CM | POA: Diagnosis not present

## 2023-11-02 DIAGNOSIS — R001 Bradycardia, unspecified: Secondary | ICD-10-CM | POA: Diagnosis not present

## 2023-11-02 DIAGNOSIS — R55 Syncope and collapse: Secondary | ICD-10-CM

## 2023-11-02 DIAGNOSIS — Z131 Encounter for screening for diabetes mellitus: Secondary | ICD-10-CM

## 2023-11-02 DIAGNOSIS — E785 Hyperlipidemia, unspecified: Secondary | ICD-10-CM

## 2023-11-02 DIAGNOSIS — I350 Nonrheumatic aortic (valve) stenosis: Secondary | ICD-10-CM

## 2023-11-02 DIAGNOSIS — R002 Palpitations: Secondary | ICD-10-CM

## 2023-11-02 NOTE — Patient Instructions (Addendum)
 Medication Instructions:  Your physician recommends that you continue on your current medications as directed. Please refer to the Current Medication list given to you today.  *If you need a refill on your cardiac medications before your next appointment, please call your pharmacy*   Testing/Procedures: Your physician has requested you have a cardiac PET stress CT scan performed. These are completed at [facility]. A scheduler will call you to schedule an appointment to have this test completed.      ZIO XT- Long Term Monitor Instructions  Your physician has requested you wear a ZIO patch monitor for 14 days.  This is a single patch monitor. [employer] supplies one patch monitor per enrollment. Additional stickers are not available. Please do not apply patch if you will be having a Nuclear Stress Test,  Echocardiogram, Cardiac CT, MRI, or Chest Xray during the period you would be wearing the  monitor. The patch cannot be worn during these tests. You cannot remove and re-apply the  ZIO XT patch monitor.  Billing and Patient Assistance Program Information  We have supplied [employer] with any of your insurance information on file for billing purposes. [employer] offers a sliding scale Patient Assistance Program for patients that do not have  insurance, or whose insurance does not completely cover the cost of the ZIO monitor.  You must apply for the Patient Assistance Program to qualify for this discounted rate.  To apply, please call [employer] at [phone], select option 4, select option 2, ask to apply for  Patient Assistance Program. Meredeth will ask your household income, and how many people  are in your household. They will quote your out-of-pocket cost based on that information.  [employer] will also be able to set up a 49-month, interest-free payment plan if needed.  When you are ready to remove the patch, follow instructions on the last 2 pages of Patient  Logbook. Stick patch  monitor onto the last page of Patient Logbook.  Place Patient Logbook in the blue and white box. Use locking tab on box and tape box closed  securely. The blue and white box has prepaid postage on it. Please place it in the mailbox as  soon as possible. Your physician should have your test results approximately 7 days after the  monitor has been mailed back to [facility].  Call [facility] Customer Care at [phone] if you have questions regarding  your ZIO XT patch monitor. Call them immediately if you see an orange light blinking on your  monitor.  If your monitor falls off in less than 4 days, contact our Monitor department at [phone].  If your monitor becomes loose or falls off after 4 days call [employer] at [phone] for  suggestions on securing your monitor      Follow-Up: At [facility], you and your health needs are our priority.  As part of our continuing mission to provide you with exceptional heart care, our providers are all part of one team.  This team includes your primary Cardiologist (physician) and Advanced Practice Providers or APPs (Physician Assistants and Nurse Practitioners) who all work together to provide you with the care you need, when you need it.  Your next appointment:   4-6 week(s)  Provider:   Vina Gull, MD       Please report to Radiology at [facility] Main Entrance, medical mall, 30 mins prior to your test.  [address]  [place], [place]  How to Prepare for  Your Cardiac PET/CT Stress Test:  Nothing to eat or drink, except water, 3 hours prior to arrival time.  NO caffeine/decaffeinated products, or chocolate 12 hours prior to arrival. (Please note decaffeinated beverages (teas/coffees) still contain caffeine).  If you have caffeine within 12 hours prior, the test will need to be rescheduled.  Medication instructions: Do not take erectile dysfunction medications for 72 hours prior to test  (sildenafil, tadalafil) Do not take nitrates (isosorbide mononitrate, Ranexa) the day before or day of test Do not take tamsulosin the day before or morning of test Hold theophylline containing medications for 12 hours. Hold Dipyridamole 48 hours prior to the test.  Diabetic Preparation: If able to eat breakfast prior to 3 hour fasting, you may take all medications, including your insulin . Do not worry if you miss your breakfast dose of insulin  - start at your next meal. If you do not eat prior to 3 hour fast-Hold all diabetes (oral and insulin ) medications. Patients who wear a continuous glucose monitor MUST remove the device prior to scanning.  You may take your remaining medications with water.  NO perfume, cologne or lotion on chest or abdomen area. FEMALES - Please avoid wearing dresses to this appointment.  Total time is 1 to 2 hours; you may want to bring reading material for the waiting time.  IF YOU THINK YOU MAY BE PREGNANT, OR ARE NURSING PLEASE INFORM THE TECHNOLOGIST.  In preparation for your appointment, medication and supplies will be purchased.  Appointment availability is limited, so if you need to cancel or reschedule, please call the Radiology Department Scheduler at [phone] 24 hours in advance to avoid a cancellation fee of $100.00  What to Expect When you Arrive:  Once you arrive and check in for your appointment, you will be taken to a preparation room within the Radiology Department.  A technologist or Nurse will obtain your medical history, verify that you are correctly prepped for the exam, and explain the procedure.  Afterwards, an IV will be started in your arm and electrodes will be placed on your skin for EKG monitoring during the stress portion of the exam. Then you will be escorted to the PET/CT scanner.  There, staff will get you positioned on the scanner and obtain a blood pressure and EKG.  During the exam, you will continue to be connected to the EKG  and blood pressure machines.  A small, safe amount of a radioactive tracer will be injected in your IV to obtain a series of pictures of your heart along with an injection of a stress agent.    After your Exam:  It is recommended that you eat a meal and drink a caffeinated beverage to counter act any effects of the stress agent.  Drink plenty of fluids for the remainder of the day and urinate frequently for the first couple of hours after the exam.  Your doctor will inform you of your test results within 7-10 business days.  For more information and frequently asked questions, please visit our website: [email or web address]  For questions about your test or how to prepare for your test, please call: Cardiac Imaging Nurse Navigators Office: [phone]

## 2023-11-02 NOTE — Progress Notes (Unsigned)
 Applied a 14 day Zio XT monitor to patient in the office ?

## 2023-11-19 NOTE — Progress Notes (Addendum)
 Cardiology Office Note   Date:  11/28/2023   ID:  Alicia [name], DOB [birth year], MRN [id number]  PCP:  Charlett Apolinar POUR, MD  Cardiologist:   Vina Gull, MD   Pt presents for f/u of HTN   History of Present Illness: Alicia [name] is a 68 y.o. female with a history of CP(atypical), palpitations    I saw the pt in Oct 2024   Sept 2025  Seen in ER for L arm pain, abdominal pain and bradycardia  Episode lasted 4 days  Was intermittent   Occasional dizziness PT seen and  sent home with cardiology follw up  Sept 2025  Echo  LVEF normal 60 to 65%  Tr MR  Mild AS Oct 2025  Seen by A [facility] in follow up  Nov 2025  Zio patch   SR 42 to 98 bpm  Average HR 58 bpm  Rare PAC, rare PVC   No pauses  29 beats  SVT, fastest for 6 beats at 167 bpm, longest 15.9 sec at 92 bpm    Since seen she denies arm, chest pain  Breathing is good  BP at home 125-130s/70-80    Current Outpatient Medications  Medication Sig Dispense Refill   Cholecalciferol (VITAMIN D3) 50 MCG (2000 UT) CHEW Chew 2,000 Units by mouth daily.     Multiple Vitamin (MULTIVITAMIN WITH MINERALS) TABS tablet Take 1 tablet by mouth daily. Women's formula     chlorthalidone  (HYGROTON ) 25 MG tablet Take 1 tablet (25 mg total) by mouth daily. 90 tablet 3   potassium chloride  (KLOR-CON  10) 10 MEQ tablet Take 1 tablet (10 mEq total) by mouth 3 (three) times a week. 30 tablet 11   spironolactone  (ALDACTONE ) 25 MG tablet Take 1 tablet (25 mg total) by mouth daily. 90 tablet 3   No current facility-administered medications for this visit.    Allergies:   Ciprofloxacin  hcl, Iodinated contrast media, Iohexol, Lidocaine hcl, Morphine, Sulfa antibiotics, Prednisone , Valsartan , Shellfish allergy, Sulfur, Ciprofloxacin , Penicillins, Sulfamethoxazole-trimethoprim, and Sulfonamide derivatives   Past Medical History:  Diagnosis Date   Allergy    Cataract    removed bilateral -2014   Chronic tension headaches    Hx of varicella     Hypertension    Kidney stones    Near syncope    Nephrolithiasis    history of  passed on own.    Palpitations    Thyroid  disease    pt. denies 06/11/19   Tubular adenoma of colon 02/1997   w/ HGD   Varicose veins     Past Surgical History:  Procedure Laterality Date   ABDOMINAL HYSTERECTOMY     fibroids non cancer    CATARACT EXTRACTION     Bilateral   cataract surgery Right 03/02/2012   CHOLECYSTECTOMY     KNEE SURGERY  04/2020   KNEE SURGERY     ROTATOR CUFF REPAIR Right    TONSILLECTOMY       Social History:  The patient  reports that she has never smoked. She has never used smokeless tobacco. She reports that she does not drink alcohol and does not use drugs.   Family History:  The patient's family history includes Cancer in her maternal aunt, mother, and sister; Colon cancer in her paternal aunt; Diabetes in her brother, mother, and sister; Esophageal cancer in her paternal uncle; Heart disease in her father, maternal grandfather, paternal grandfather, and paternal grandmother; Liver disease in her mother; Prostate cancer  in her father; Stroke in her father.    ROS:  Please see the history of present illness. All other systems are reviewed and  Negative to the above problem except as noted.    PHYSICAL EXAM: VS:  BP 136/84 (BP Location: Left Arm, Patient Position: Sitting, Cuff Size: Large)   Pulse 61   Ht 5' 6 (1.676 m)   Wt 243 lb 4.8 oz (110.4 kg)   SpO2 96%   BMI 39.27 kg/m   GEN: Obese 68 yo  in no acute distress  HEENT: normal  Neck:  JVP is normal    No  carotid bruits Cardiac: RRR; II/VI systolic murmur  Respiratory:  clear to auscultation GI: soft, nontender,  Ext  No LE edema     EKG:  EKG not done today     ZIo patch  Nov 2025  Patch Wear Time:  10 days and 4 hours (2025-10-16T08:57:49-0400 to 2025-10-26T13:50:17-0400)   Impression:  Sinus rhythm   Rates 42 to 98 bpm  Average HR 58 bpm Rare PAC Rare PVC 29 bursts SVT, fastest for 6 beats  at 167 bpm, longest for 16.9 sec at average HR 92 bpm   Triggered events correlated with Sinus rhythm   Echo   Sept 2025   1. Left ventricular ejection fraction, by estimation, is 60 to 65%. The  left ventricle has normal function. The left ventricle has no regional  wall motion abnormalities. There is mild left ventricular hypertrophy.  Left ventricular diastolic parameters  were normal.   2. Right ventricular systolic function is normal. The right ventricular  size is normal. Tricuspid regurgitation signal is inadequate for assessing  PA pressure.   3. The mitral valve is normal in structure. Trivial mitral valve  regurgitation.   4. The aortic valve was not well visualized. Aortic valve regurgitation  is not visualized. Mild aortic valve stenosis.   5. The inferior vena cava is normal in size with greater than 50%  respiratory variability, suggesting right atrial pressure of 3 mmHg.   Lipid Panel    Component Value Date/Time   CHOL 188 10/10/2023 0941   CHOL 201 (H) 12/01/2020 1159   TRIG 93.0 10/10/2023 0941   HDL 65.00 10/10/2023 0941   HDL 68 12/01/2020 1159   CHOLHDL 3 10/10/2023 0941   VLDL 18.6 10/10/2023 0941   LDLCALC 105 (H) 10/10/2023 0941   LDLCALC 111 (H) 12/01/2020 1159      Wt Readings from Last 3 Encounters:  11/28/23 243 lb 4.8 oz (110.4 kg)  11/02/23 240 lb (108.9 kg)  10/10/23 241 lb 9.6 oz (109.6 kg)      ASSESSMENT AND PLAN:  1  Chest pain  Pt with episode of CP earlier this fall  Atypical    I do not think symptoms were cardiac Doing well now I would recomm cancelling stress test  2  Aortic stenosis   Mild on echo   Mean gradient 6 mm Hg   Will follow over time   3  HTN  BP is a little high here   Normal to a little high at home  I would increase spironolactone  to 25 mg   FOllow up BMET in 3 wks   4 Lipids  LDL 105  HDL 65  Trig 93  5  Metabolics   A1C 6   Reviewed diet     Low carb, time restricted    PT goes to [facility] regularly   Continue   Current medicines  are reviewed at length with the patient today.  The patient does not have concerns regarding medicines.  Signed, Vina Gull, MD

## 2023-11-21 DIAGNOSIS — R55 Syncope and collapse: Secondary | ICD-10-CM

## 2023-11-23 ENCOUNTER — Ambulatory Visit: Payer: Self-pay | Admitting: Physician Assistant

## 2023-11-28 ENCOUNTER — Ambulatory Visit: Attending: Internal Medicine | Admitting: Internal Medicine

## 2023-11-28 ENCOUNTER — Encounter: Payer: Self-pay | Admitting: Internal Medicine

## 2023-11-28 VITALS — BP 136/84 | HR 61 | Ht 66.0 in | Wt 243.3 lb

## 2023-11-28 DIAGNOSIS — I10 Essential (primary) hypertension: Secondary | ICD-10-CM

## 2023-11-28 DIAGNOSIS — Z79899 Other long term (current) drug therapy: Secondary | ICD-10-CM | POA: Diagnosis not present

## 2023-11-28 MED ORDER — SPIRONOLACTONE 25 MG PO TABS: 25.0000 mg | ORAL_TABLET | Freq: Every day | ORAL | 3 refills | Status: AC

## 2023-11-28 MED ORDER — CHLORTHALIDONE 25 MG PO TABS
25.0000 mg | ORAL_TABLET | Freq: Every day | ORAL | 3 refills | Status: AC
Start: 2023-11-28 — End: ?

## 2023-11-28 MED ORDER — POTASSIUM CHLORIDE ER 10 MEQ PO TBCR: 10.0000 meq | EXTENDED_RELEASE_TABLET | ORAL | 11 refills | Status: AC

## 2023-11-28 NOTE — Patient Instructions (Signed)
 Medication Instructions:  Your physician has recommended you make the following change in your medication:   INCREASE spironolactone  to 25 mg daily  *If you need a refill on your cardiac medications before your next appointment, please call your pharmacy*  Lab Work: In 3 weeks at any [facility] (no appointment needed): BMET If you have labs (blood work) drawn today and your tests are completely normal, you will receive your results only by: MyChart Message (if you have MyChart) OR A paper copy in the mail If you have any lab test that is abnormal or we need to change your treatment, we will call you to review the results.  Follow-Up: At [facility], you and your health needs are our priority.  As part of our continuing mission to provide you with exceptional heart care, our providers are all part of one team.  This team includes your primary Cardiologist (physician) and Advanced Practice Providers or APPs (Physician Assistants and Nurse Practitioners) who all work together to provide you with the care you need, when you need it.  Your next appointment:   8 month(s)  Provider:   Vina Gull, MD   We recommend signing up for the patient portal called MyChart.  Sign up information is provided on this After Visit Summary.  MyChart is used to connect with patients for Virtual Visits (Telemedicine).  Patients are able to view lab/test results, encounter notes, upcoming appointments, etc.  Non-urgent messages can be sent to your provider as well.    To learn more about what you can do with MyChart, go to [email or web address].

## 2023-12-18 ENCOUNTER — Telehealth: Payer: Self-pay | Admitting: Pediatrics

## 2023-12-18 NOTE — Telephone Encounter (Signed)
 Good Afternoon Dr Suzann   Patient preferred provider  Patient requesting transfer of care to be seen for IBS. Patient states she called [facility] to schedule but they have referred the patient back to us  due to them not having a provider available to see patient for IBS. Patient states she has a letter ibn her chart in regards to this information.   Records are available in care everywhere. Please review and advise on scheduling.   Previous patient if Dr Aneita.

## 2023-12-19 NOTE — Telephone Encounter (Signed)
 Patient scheduled for next available with provider.

## 2024-02-11 NOTE — Progress Notes (Unsigned)
 "  [facility] Return Visit   Referring Provider Panosh, Apolinar POUR, MD [address] [place],  [place] [address]  Primary Care Provider Panosh, Apolinar POUR, MD  Patient Profile: Alicia [name] is a 69 y.o. female who returns to the [facility] for follow-up of the problem(s) noted below.  Problem List: IBS-D Hematochezia Internal and external hemorrhoids History of colon polyps-multiple TA and SSA Colonic diverticulosis Hepatic steatosis - LFTs normal Status postcholecystectomy   History of Present Illness   Alicia [name] is a 69 year old female with a past medical history noteworthy for HTN, thyroid  disease, chronic venous insufficiency, allergic rhinitis, obesity, nephrolithiasis, headaches     Discussed the use of AI scribe software for clinical note transcription with the patient, who gave verbal consent to proceed.  History of Present Illness   Current GI Meds    Interval History       GI Review of Symptoms Significant for {GIROS:50592}. Otherwise negative.  General Review of Systems  Review of systems is significant for the pertinent positives and negatives as listed per the HPI.  Full ROS is otherwise negative.  Past Medical History   Past Medical History:  Diagnosis Date   Allergy    Cataract    removed bilateral -2014   Chronic tension headaches    Hx of varicella    Hypertension    Kidney stones    Near syncope    Nephrolithiasis    history of  passed on own.    Palpitations    Thyroid  disease    pt. denies 06/11/19   Tubular adenoma of colon 02/1997   w/ HGD   Varicose veins      Past Surgical History   Past Surgical History:  Procedure Laterality Date   ABDOMINAL HYSTERECTOMY     fibroids non cancer    CATARACT EXTRACTION     Bilateral   cataract surgery Right 03/02/2012   CHOLECYSTECTOMY     KNEE SURGERY  04/2020   KNEE SURGERY     ROTATOR CUFF REPAIR Right    TONSILLECTOMY        Allergies and Medications   Allergies[1] @MEDSTODAY @  Family History   Family History  Problem Relation Age of Onset   Cancer Mother        CERVICAL uterin cancer    Liver disease Mother        from chronic heaptitis    Diabetes Mother    Heart disease Father        avdisease avr   Stroke Father    Prostate cancer Father    Cancer Sister        Ovarian and endometrial   Cancer Maternal Aunt        Colon   Heart disease Maternal Grandfather    Heart disease Paternal Grandmother    Heart disease Paternal Grandfather    Diabetes Sister    Diabetes Brother    Colon cancer Paternal Aunt    Esophageal cancer Paternal Uncle    Rectal cancer Neg Hx    Stomach cancer Neg Hx    GI Specific Family History: {gifamhx:50061}   Social History   Social History[2] Alicia [name] reports that she has never smoked. She has never used smokeless tobacco. She reports that she does not drink alcohol and does not use drugs.  Vital Signs and Physical Examination   There were no vitals filed for this visit. There is no height or weight on file to calculate BMI.  General: Well developed, well nourished, no acute distress Head: Normocephalic and atraumatic Eyes: Sclerae anicteric, EOMI Ears: Normal auditory acuity Mouth: No deformities or lesions noted Lungs: Clear throughout to auscultation Heart: Regular rate and rhythm; No murmurs, rubs or bruits Abdomen: Soft, non tender and non distended. No masses, hepatosplenomegaly or hernias noted. Normal Bowel sounds Rectal: Musculoskeletal: Symmetrical with no gross deformities  Pulses:  Normal pulses noted Extremities: No edema or deformities noted Neurological: Alert oriented x 4, grossly nonfocal Psychological:  Alert and cooperative. Normal mood and affect   Review of Data   The following data was reviewed at the time of this encounter:   Laboratory Studies      Latest Ref Rng & Units 09/21/2023    2:15 PM 04/10/2023   10:08 AM  11/17/2022    9:22 AM  CBC  WBC 4.0 - 10.5 K/uL 6.3  6.3  5.5   Hemoglobin 12.0 - 15.0 g/dL 85.7  86.5  85.6   Hematocrit 36.0 - 46.0 % 40.9  39.8  42.8   Platelets 150 - 400 K/uL 206  208  241     Lab Results  Component Value Date   LIPASE 29 09/21/2023      Latest Ref Rng & Units 11/01/2023    9:19 AM 10/10/2023    9:41 AM 09/21/2023    2:15 PM  CMP  Glucose 70 - 99 mg/dL 897  95  90   BUN 6 - 23 mg/dL 17  15  15    Creatinine 0.40 - 1.20 mg/dL 9.35  9.34  9.33   Sodium 135 - 145 mEq/L 141  143  139   Potassium 3.5 - 5.1 mEq/L 3.8  3.4  3.5   Chloride 96 - 112 mEq/L 102  103  101   CO2 19 - 32 mEq/L 29  29  26    Calcium 8.4 - 10.5 mg/dL 9.1  9.4  9.7   Total Protein 6.0 - 8.3 g/dL  6.9  7.4   Total Bilirubin 0.2 - 1.2 mg/dL  0.9  1.2   Alkaline Phos 39 - 117 U/L  64  83   AST 0 - 37 U/L  20  27   ALT 0 - 35 U/L  16  18      Imaging Studies  CTAP 04/10/2023 1. No acute abdominopelvic findings.  Normal appendix. 2. Extensive colonic diverticulosis without evidence of acute diverticulitis. 3. Hepatic steatosis.     GI Procedures and Studies   Colonoscopy 04/27/2023 ([facility], Dr. Claretha) Somewhat difficult-redundant colon requiring abdominal pressure BBPS equals 6 - Seven 3 to 11 mm polyps in the transverse colon, in the ascending colon and in the cecum, removed with a cold snare. Resected and retrieved. - Seven 3 to 5 mm polyps at the recto-sigmoid colon, removed with a cold snare. Resected and retrieved. - Diverticulosis in the entire examined colon. - Hemorrhoids.  Path: Right colon polyps (7) -TA x 5, SSA x 2 Left colon polyps (7) -hyperplastic polyps  Colonoscopy 03/18/2023  - Preparation of the colon was inadequate. - Diverticulosis in the entire examined colon. - Two small (4-6 mm) polyps in the ascending colon, removed with a cold snare. Resected and retrieved. - External hemorrhoids.  Path: Tubular adenomas x 2  Colonoscopy 06/11/2019 - External  hemorrhoids, hemorrhoid tags found on perianal exam.  - Severe diverticulosis thoughout the colon.  - Grade II internal hemorrhoids.  - Otherwise normal appearing colonoscopy.  - No specimens collected.  Colonoscopy  03/17/2015 - 6 mm polyp in transverse colon - Reticulosis in sigmoid colon, descending colon, transverse colon  Clinical Impression  It is my clinical impression that Alicia [name] is a 69 y.o. female with;  ***  Plan  *** *** *** *** ***   Planned Follow Up No follow-ups on file.  The patient or caregiver verbalized understanding of the material covered, with no barriers to understanding. All questions were answered. Patient or caregiver is agreeable with the plan outlined above.    It was a pleasure to see Alicia [name].  If you have any questions or concerns regarding this evaluation, do not hesitate to contact me.  Inocente Hausen, MD [facility]     [1]  Allergies Allergen Reactions   Ciprofloxacin  Hcl Hives   Iodinated Contrast Media Anaphylaxis and Other (See Comments)    Other reaction(s): Unknown   Iohexol Anaphylaxis    Pt.states she stopped breathing and was admitted to the ICU~10 years ago   Lidocaine Hcl Other (See Comments)    Difficulty breathing Difficulty breathing Difficulty breathing   Morphine Other (See Comments)    Hallucination Hallucination   Sulfa Antibiotics Hives and Rash   Prednisone  Other (See Comments)    Flushed face and felt very hot all over with 40 of pred  See march 17 notes Flushed face and felt very hot all over with 40 of pred  See march 17 notes   Valsartan  Cough   Shellfish Allergy Diarrhea and Nausea Only   Sulfur     Other reaction(s): Unknown   Ciprofloxacin  Itching and Rash   Penicillins Rash    Has patient had a PCN reaction causing immediate rash, facial/tongue/throat swelling, SOB or lightheadedness with hypotension: yes Has patient had a PCN reaction causing severe rash involving mucus membranes  or skin necrosis: yes Has patient had a PCN reaction that required hospitalization: no Has patient had a PCN reaction occurring within the last 10 years: no If all of the above answers are NO, then may proceed with Cephalosporin use.    Sulfamethoxazole-Trimethoprim Rash   Sulfonamide Derivatives Rash  [2]  Social History Tobacco Use   Smoking status: Never   Smokeless tobacco: Never  Vaping Use   Vaping status: Never Used  Substance Use Topics   Alcohol use: No   Drug use: No   "

## 2024-02-14 ENCOUNTER — Ambulatory Visit: Admitting: Pediatrics

## 2024-03-12 ENCOUNTER — Ambulatory Visit: Admitting: Pediatrics
